# Patient Record
Sex: Female | Born: 1960 | Race: White | Hispanic: No | Smoking: Former smoker
Health system: Southern US, Community
[De-identification: ages and names within clinical notes are randomized; demographics above are authoritative.]

## PROBLEM LIST (undated history)

## (undated) DIAGNOSIS — B029 Zoster without complications: Secondary | ICD-10-CM

## (undated) DIAGNOSIS — M810 Age-related osteoporosis without current pathological fracture: Secondary | ICD-10-CM

## (undated) DIAGNOSIS — G4730 Sleep apnea, unspecified: Secondary | ICD-10-CM

## (undated) DIAGNOSIS — A389 Scarlet fever, uncomplicated: Secondary | ICD-10-CM

## (undated) DIAGNOSIS — F32A Depression, unspecified: Secondary | ICD-10-CM

## (undated) DIAGNOSIS — S82899A Other fracture of unspecified lower leg, initial encounter for closed fracture: Secondary | ICD-10-CM

## (undated) DIAGNOSIS — E119 Type 2 diabetes mellitus without complications: Secondary | ICD-10-CM

## (undated) DIAGNOSIS — R161 Splenomegaly, not elsewhere classified: Secondary | ICD-10-CM

## (undated) DIAGNOSIS — K760 Fatty (change of) liver, not elsewhere classified: Secondary | ICD-10-CM

## (undated) DIAGNOSIS — M8580 Other specified disorders of bone density and structure, unspecified site: Secondary | ICD-10-CM

## (undated) DIAGNOSIS — D696 Thrombocytopenia, unspecified: Secondary | ICD-10-CM

## (undated) HISTORY — DX: Thrombocytopenia, unspecified: D69.6

## (undated) HISTORY — DX: Age-related osteoporosis without current pathological fracture: M81.0

## (undated) HISTORY — DX: Type 2 diabetes mellitus without complications: E11.9

## (undated) HISTORY — DX: Depression, unspecified: F32.A

## (undated) HISTORY — PX: ABDOMINAL HYSTERECTOMY: SHX81

## (undated) HISTORY — DX: Fatty (change of) liver, not elsewhere classified: K76.0

## (undated) HISTORY — PX: BREAST BIOPSY: SHX20

## (undated) HISTORY — PX: CERVICAL FUSION: SHX112

## (undated) HISTORY — DX: Splenomegaly, not elsewhere classified: R16.1

---

## 2011-01-13 DIAGNOSIS — G4733 Obstructive sleep apnea (adult) (pediatric): Secondary | ICD-10-CM | POA: Insufficient documentation

## 2011-01-13 DIAGNOSIS — M5030 Other cervical disc degeneration, unspecified cervical region: Secondary | ICD-10-CM | POA: Insufficient documentation

## 2011-01-13 DIAGNOSIS — F341 Dysthymic disorder: Secondary | ICD-10-CM | POA: Insufficient documentation

## 2011-01-13 DIAGNOSIS — A6000 Herpesviral infection of urogenital system, unspecified: Secondary | ICD-10-CM | POA: Insufficient documentation

## 2011-01-13 DIAGNOSIS — M899 Disorder of bone, unspecified: Secondary | ICD-10-CM | POA: Insufficient documentation

## 2011-01-13 DIAGNOSIS — E785 Hyperlipidemia, unspecified: Secondary | ICD-10-CM | POA: Insufficient documentation

## 2011-04-03 ENCOUNTER — Emergency Department (INDEPENDENT_AMBULATORY_CARE_PROVIDER_SITE_OTHER): Payer: BC Managed Care – PPO

## 2011-04-03 ENCOUNTER — Encounter: Payer: Self-pay | Admitting: *Deleted

## 2011-04-03 ENCOUNTER — Emergency Department (HOSPITAL_BASED_OUTPATIENT_CLINIC_OR_DEPARTMENT_OTHER)
Admission: EM | Admit: 2011-04-03 | Discharge: 2011-04-03 | Disposition: A | Discharge status: Home / Self Care | Payer: BC Managed Care – PPO | Attending: Emergency Medicine | Admitting: Emergency Medicine

## 2011-04-03 DIAGNOSIS — R0602 Shortness of breath: Secondary | ICD-10-CM

## 2011-04-03 DIAGNOSIS — R0789 Other chest pain: Secondary | ICD-10-CM

## 2011-04-03 DIAGNOSIS — Z21 Asymptomatic human immunodeficiency virus [HIV] infection status: Secondary | ICD-10-CM | POA: Insufficient documentation

## 2011-04-03 DIAGNOSIS — G4730 Sleep apnea, unspecified: Secondary | ICD-10-CM | POA: Insufficient documentation

## 2011-04-03 DIAGNOSIS — J9801 Acute bronchospasm: Secondary | ICD-10-CM | POA: Insufficient documentation

## 2011-04-03 DIAGNOSIS — Z79899 Other long term (current) drug therapy: Secondary | ICD-10-CM | POA: Insufficient documentation

## 2011-04-03 DIAGNOSIS — R05 Cough: Secondary | ICD-10-CM

## 2011-04-03 DIAGNOSIS — F17200 Nicotine dependence, unspecified, uncomplicated: Secondary | ICD-10-CM | POA: Insufficient documentation

## 2011-04-03 DIAGNOSIS — R071 Chest pain on breathing: Secondary | ICD-10-CM | POA: Insufficient documentation

## 2011-04-03 HISTORY — DX: Scarlet fever, uncomplicated: A38.9

## 2011-04-03 HISTORY — DX: Other specified disorders of bone density and structure, unspecified site: M85.80

## 2011-04-03 HISTORY — DX: Sleep apnea, unspecified: G47.30

## 2011-04-03 HISTORY — DX: Zoster without complications: B02.9

## 2011-04-03 HISTORY — DX: Other fracture of unspecified lower leg, initial encounter for closed fracture: S82.899A

## 2011-04-03 IMAGING — CR DG CHEST 2V
2 series · 2 of 2 positions shown · non-contrast
Comparison: None.

CLINICAL DATA: Right chest pain, cough, shortness of breath

CHEST - 2 VIEW

[w chest pa]
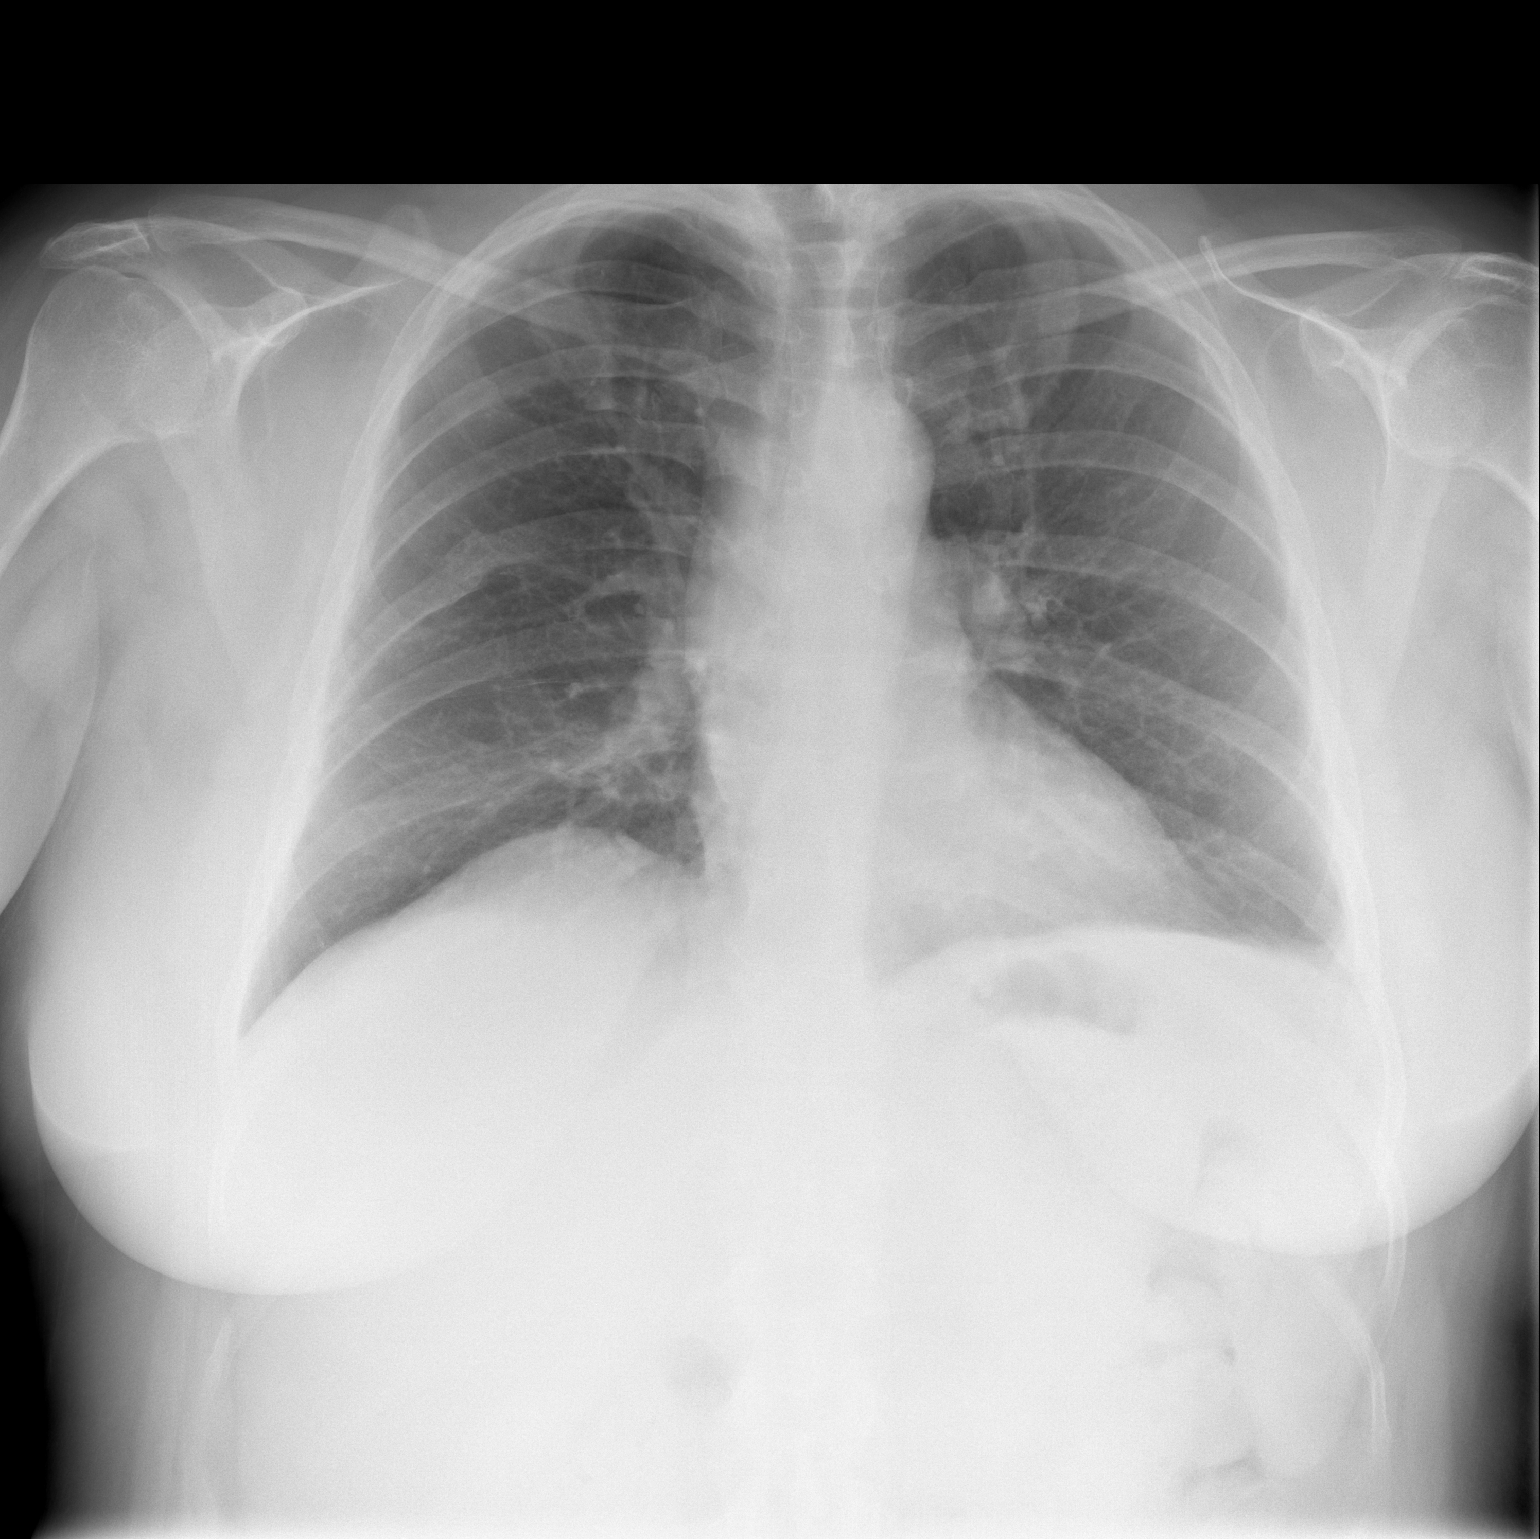

[w chest lat]
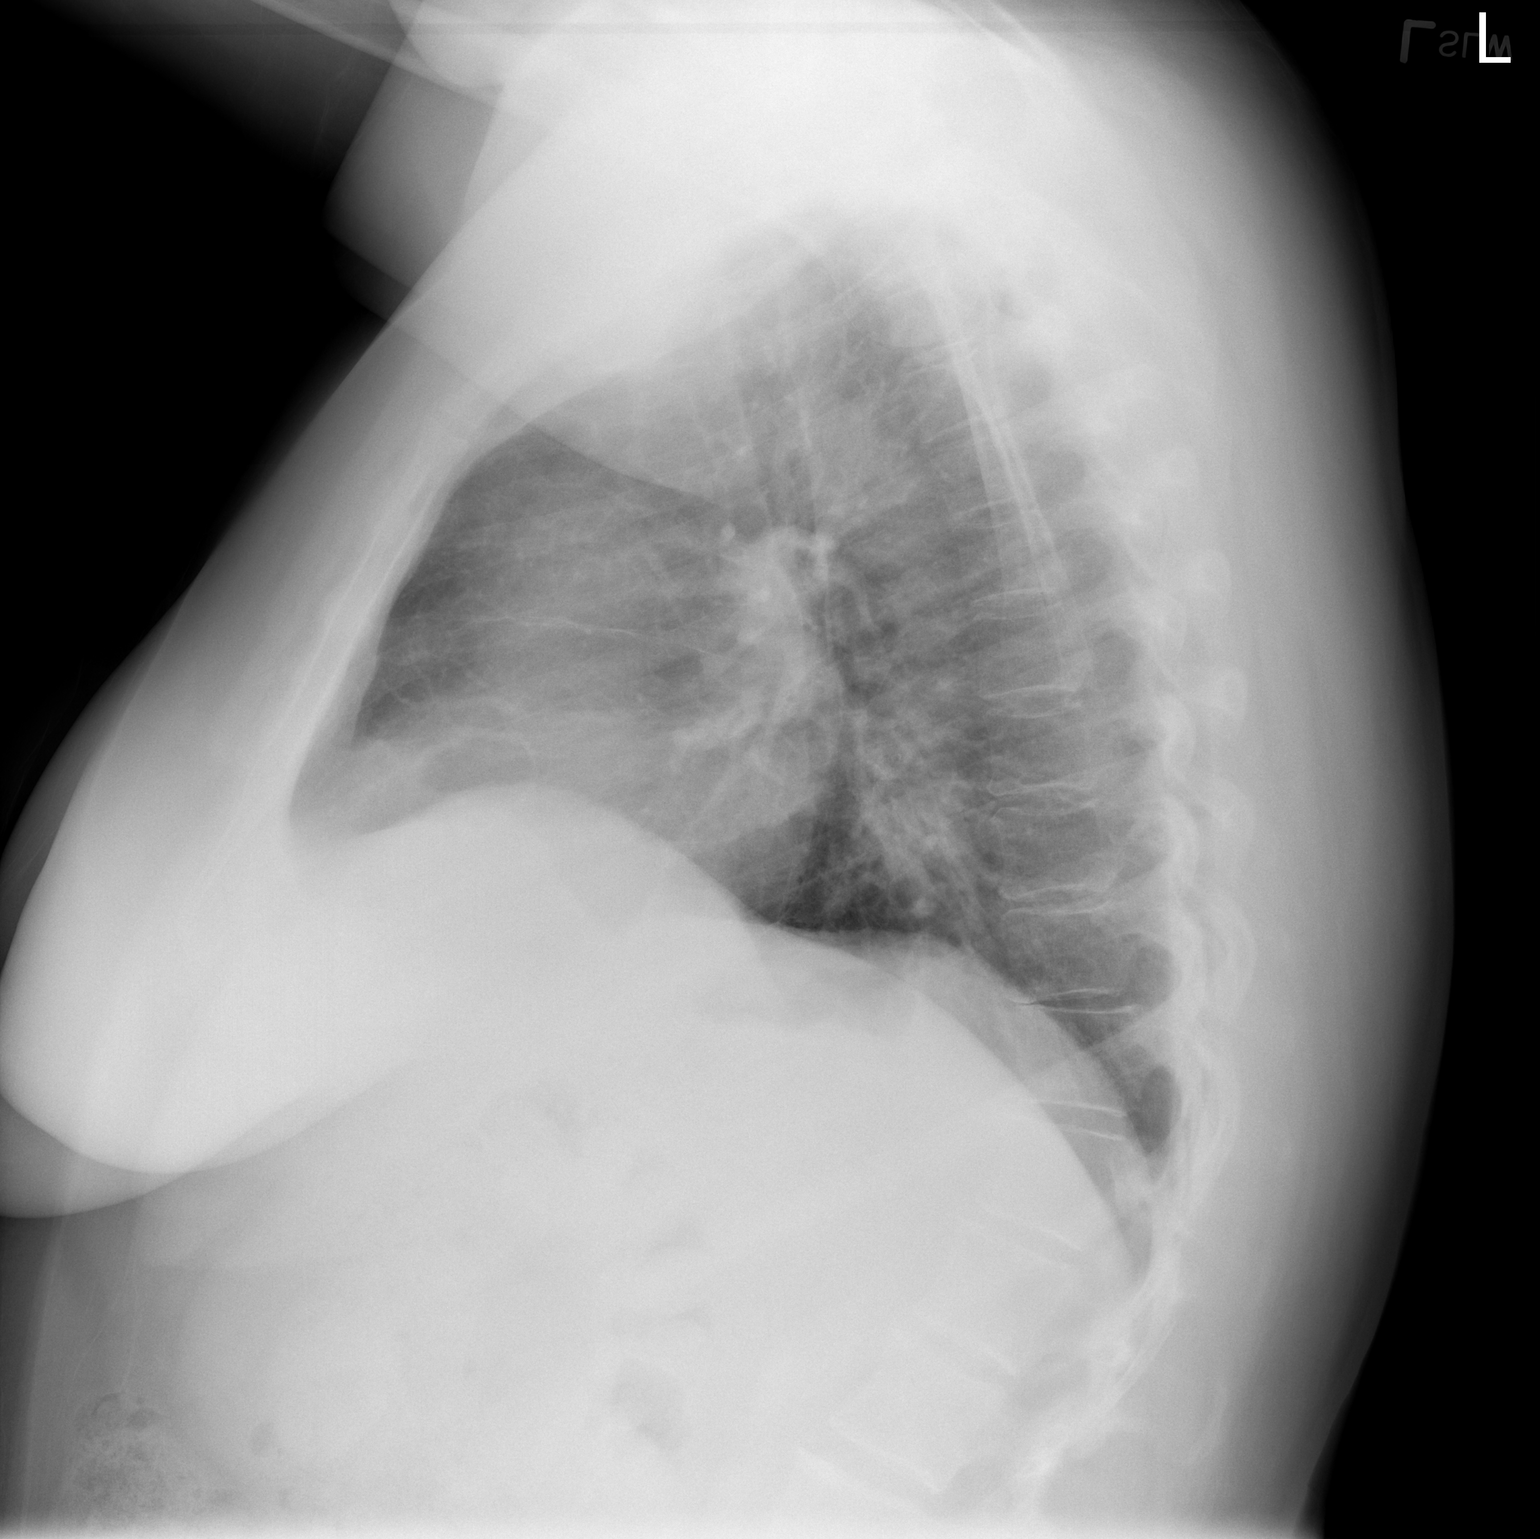

[2 of 2 positions shown; findings below may reference images not displayed]

FINDINGS: Lungs are clear. No pleural effusion or pneumothorax.

Cardiomediastinal silhouette is within normal limits.

Mild degenerative changes of the visualized thoracolumbar spine.
IMPRESSION: No evidence of acute cardiopulmonary disease.

## 2011-04-03 MED ORDER — LORAZEPAM 1 MG PO TABS: 1.0000 mg | ORAL_TABLET | Freq: Four times a day (QID) | ORAL | Status: AC | PRN

## 2011-04-03 MED ORDER — PREDNISONE 20 MG PO TABS: ORAL_TABLET | ORAL | Status: AC

## 2011-04-03 MED ORDER — HYDROMORPHONE HCL PF 2 MG/ML IJ SOLN
2.0000 mg | Freq: Once | INTRAMUSCULAR | Status: AC
  Administered 2011-04-03: 2 mg via INTRAMUSCULAR
  Filled 2011-04-03: qty 1

## 2011-04-03 MED ORDER — LORAZEPAM 2 MG/ML IJ SOLN
2.0000 mg | Freq: Once | INTRAMUSCULAR | Status: AC
  Administered 2011-04-03: 2 mg via INTRAMUSCULAR
  Filled 2011-04-03: qty 1

## 2011-04-03 MED ORDER — SERTRALINE HCL 50 MG PO TABS: 50.0000 mg | ORAL_TABLET | Freq: Every day | ORAL | Status: DC

## 2011-04-03 MED ORDER — OXYCODONE-ACETAMINOPHEN 5-325 MG PO TABS: 2.0000 | ORAL_TABLET | ORAL | Status: AC | PRN

## 2011-04-03 NOTE — ED Provider Notes (Addendum)
History  Scribed for Hurman Horn, MD, the patient was seen in room [facility]. This chart was scribed by Hillery Hunter.   CSN: [id number] Arrival date & time: 04/03/2011  3:43 PM   First MD Initiated Contact with Patient 04/03/11 1747      Chief Complaint  Patient presents with  . Rib Injury    The history is provided by the patient.    Helen Sandoval is a 50 y.o. female who presents to the Emergency Department complaining of coughing and wheezing for three weeks and now with right lateral rib pain for one week. She denies associated shortness of breath but rib pain is exacerbated with coughing, breathing and twisting movements. She followed-up with her PCP last week and was given Flexeril and Celebrex and splinting without pain relief. She has started taking an Albuterol inhaler (denies hx asthma) which has improved her associated wheezing. She denies history of DM, CA, asthma, COPD, and does not smoke, drink, or use any drugs.    Past Medical History  Diagnosis Date  . Sleep apnea   . Vitamin D deficiency   . Spina bifida   . Scarlet fever   . Shingles   . Hepatitis B infection   . Hepatitis C   . HIV (human immunodeficiency virus infection)   . Ankle fracture   . Osteopenia     Past Surgical History  Procedure Date  . Abdominal hysterectomy     History reviewed. No pertinent family history.  History  Substance Use Topics  . Smoking status: Current Some Day Smoker  . Smokeless tobacco: Not on file  . Alcohol Use: No     Review of Systems  Constitutional: Negative for fever.  Respiratory: Positive for wheezing. Negative for cough and shortness of breath.   Cardiovascular: Positive for chest pain (right lateral lower rib pain).  Gastrointestinal: Negative for abdominal pain.  Skin: Negative for rash.  Psychiatric/Behavioral: Negative for confusion.    Allergies  Penicillins cross reactors; Levaquin; and Zocor  Home Medications   Current  Outpatient Rx  Name Route Sig Dispense Refill  . ASPIRIN EC 81 MG PO TBEC Oral Take 81 mg by mouth every other day.      . B COMPLEX PO TABS Oral Take 1 tablet by mouth daily.      . BUPROPION HCL ER (XL) 150 MG PO TB24 Oral Take 150 mg by mouth daily.      Marland Kitchen CALCIUM 1200 PO Oral Take 1 tablet by mouth daily.      . CELECOXIB 200 MG PO CAPS Oral Take 200 mg by mouth once.      Marland Kitchen VITAMIN D 1000 UNITS PO TABS Oral Take 5,000 Units by mouth daily.      . CHROMIUM 500 MCG PO TABS Oral Take 1 tablet by mouth daily.      . CYCLOBENZAPRINE HCL 10 MG PO TABS Oral Take 10 mg by mouth 3 (three) times daily as needed. For muscle spasms     . ESTRADIOL 25 MCG VA TABS Vaginal Place 25 mcg vaginally 2 (two) times a week. Take on Tuesday and Friday     . ESTRADIOL 0.0375 MG/24HR TD PTTW Transdermal Place 1 patch onto the skin 2 (two) times a week. Replace on Monday and Thursday     . ONE-DAILY MULTI VITAMINS PO TABS Oral Take 1 tablet by mouth daily.      Marland Kitchen FISH OIL 500 MG PO CAPS Oral Take 1 capsule by  mouth daily.      Marland Kitchen OMEPRAZOLE 40 MG PO CPDR Oral Take 40 mg by mouth daily.      . SERTRALINE HCL 50 MG PO TABS Oral Take 50 mg by mouth daily.      Marland Kitchen VALACYCLOVIR HCL 500 MG PO TABS Oral Take 500 mg by mouth daily.      Marland Kitchen VITAMIN C 500 MG PO TABS Oral Take 500 mg by mouth daily.        Triage Vitals: BP 114/76  Pulse 80  Temp(Src) 98 F (36.7 C) (Oral)  Resp 20  Ht 5\' 4"  (1.626 m)  Wt 190 lb (86.183 kg)  BMI 32.61 kg/m2  SpO2 94%  Physical Exam  Nursing note and vitals reviewed. Constitutional:       Awake, alert, nontoxic appearance.  HENT:  Head: Atraumatic.  Eyes: Right eye exhibits no discharge. Left eye exhibits no discharge.  Neck: Neck supple.  Cardiovascular: Normal rate, regular rhythm and normal heart sounds.   No murmur heard. Pulmonary/Chest: Effort normal. She has wheezes (mild expiratory). She has no rhonchi. She has no rales. She exhibits no tenderness.  Abdominal: Soft.  Bowel sounds are normal. There is no tenderness. There is no rebound.  Musculoskeletal: She exhibits no tenderness.       Baseline ROM, no obvious new focal weakness. No midline tenderness entire spine Right lateral lower rib cage exquisitely tender to palpation  Neurological:       Mental status and motor strength appears baseline for patient and situation.  Skin: Skin is dry. No rash noted.  Psychiatric: She has a normal mood and affect.    ED Course  Procedures   Dg Chest 2 View  04/03/2011  *RADIOLOGY REPORT*  Clinical Data: Right chest pain, cough, shortness of breath  CHEST - 2 VIEW  Comparison: None.  Findings: Lungs are clear. No pleural effusion or pneumothorax.  Cardiomediastinal silhouette is within normal limits.  Mild degenerative changes of the visualized thoracolumbar spine.  IMPRESSION: No evidence of acute cardiopulmonary disease.  Original Report Authenticated By: Charline Bills, M.D.     OTHER DATA REVIEWED: Nursing notes, vital signs, and past medical records reviewed.   DIAGNOSTIC STUDIES: Oxygen Saturation is 94% on room air, adequate by my interpretation.     ED COURSE / COORDINATION OF CARE: 17:56. Ordered: New - HYDROmorphone (DILAUDID) injection 2 mg ; LORazepam (ATIVAN) injection 2 mg.  17:59:08 Discharge Orders Placed: JB oxyCODONE-acetaminophen (PERCOCET) 5-325 MG per tablet ; LORazepam (ATIVAN) 1 MG tablet, predniSONE (DELTASONE) 20 MG tablet     MDM  Patient informed of clinical course, understand medical decision-making process, and agree with plan.  I doubt any other EMC precluding discharge at this time including, but not necessarily limited to the following:sepsis, ACS.   1. Chest wall pain   2. Bronchospasm       I personally performed the services described in this documentation, which was scribed in my presence. The recorded information has been reviewed and considered.    Hurman Horn, MD 04/04/11 0004  Addendum  [id number]: HIV, Hep B, and Hep C should have been stricken from the PMH (denied by the Pt) and were inadvertently included in the PMH recorded prior to the MD exam.   Hurman Horn, MD 04/11/11 1434

## 2011-04-03 NOTE — ED Notes (Signed)
Pt states she has had right rib pain X 1 week. Increased with movement and deep inspiration. Also C/O cough, SHOB. Saw Dr last week. 3 weeks ago had CXR and Incentive Spirometry. Dx'd with "a little obstruction" Sleep study test last Sat. Has tried Flexeril, Celebrex and a brace without relief. Stress test done in August "normal"

## 2011-04-06 ENCOUNTER — Telehealth (HOSPITAL_BASED_OUTPATIENT_CLINIC_OR_DEPARTMENT_OTHER): Payer: Self-pay | Admitting: Emergency Medicine

## 2011-04-06 ENCOUNTER — Encounter (HOSPITAL_BASED_OUTPATIENT_CLINIC_OR_DEPARTMENT_OTHER): Payer: Self-pay

## 2011-04-06 NOTE — ED Notes (Signed)
Pt called and stated that Medical History was reported incorrectly.  Stated that she was negative for Hepatitis B, Hepatitis C, and HIV.  History updated to reflect correct information

## 2013-03-25 DIAGNOSIS — G0491 Myelitis, unspecified: Secondary | ICD-10-CM | POA: Insufficient documentation

## 2013-08-26 DIAGNOSIS — G35 Multiple sclerosis: Secondary | ICD-10-CM | POA: Insufficient documentation

## 2013-10-24 DIAGNOSIS — K5732 Diverticulitis of large intestine without perforation or abscess without bleeding: Secondary | ICD-10-CM | POA: Insufficient documentation

## 2014-01-13 DIAGNOSIS — F332 Major depressive disorder, recurrent severe without psychotic features: Secondary | ICD-10-CM | POA: Insufficient documentation

## 2014-01-13 DIAGNOSIS — F419 Anxiety disorder, unspecified: Secondary | ICD-10-CM

## 2014-01-13 DIAGNOSIS — F329 Major depressive disorder, single episode, unspecified: Secondary | ICD-10-CM | POA: Insufficient documentation

## 2014-03-28 DIAGNOSIS — N3941 Urge incontinence: Secondary | ICD-10-CM | POA: Insufficient documentation

## 2014-05-08 DIAGNOSIS — N3946 Mixed incontinence: Secondary | ICD-10-CM | POA: Insufficient documentation

## 2015-01-06 ENCOUNTER — Encounter: Payer: Self-pay | Admitting: Neurology

## 2015-01-06 ENCOUNTER — Ambulatory Visit (INDEPENDENT_AMBULATORY_CARE_PROVIDER_SITE_OTHER): Payer: No Typology Code available for payment source | Admitting: Neurology

## 2015-01-06 VITALS — BP 130/90 | HR 86 | Resp 16 | Ht 64.0 in | Wt 178.2 lb

## 2015-01-06 DIAGNOSIS — R5383 Other fatigue: Secondary | ICD-10-CM | POA: Diagnosis not present

## 2015-01-06 DIAGNOSIS — R269 Unspecified abnormalities of gait and mobility: Secondary | ICD-10-CM | POA: Diagnosis not present

## 2015-01-06 DIAGNOSIS — F329 Major depressive disorder, single episode, unspecified: Secondary | ICD-10-CM

## 2015-01-06 DIAGNOSIS — G35 Multiple sclerosis: Secondary | ICD-10-CM

## 2015-01-06 DIAGNOSIS — N3946 Mixed incontinence: Secondary | ICD-10-CM | POA: Diagnosis not present

## 2015-01-06 DIAGNOSIS — F418 Other specified anxiety disorders: Secondary | ICD-10-CM

## 2015-01-06 DIAGNOSIS — F419 Anxiety disorder, unspecified: Secondary | ICD-10-CM

## 2015-01-06 MED ORDER — TRAZODONE HCL 100 MG PO TABS: 100.0000 mg | ORAL_TABLET | Freq: Every day | ORAL | Status: DC

## 2015-01-06 NOTE — Progress Notes (Addendum)
[facility]  PATIENT: Helen Sandoval DOB: 07-Nov-1960  REFERRING DOCTOR OR PCP:  Otto Herb. ([facility])   Sinclair Ship (new PCP at [facility] SOURCE: patient and records from [facility]  _________________________________   HISTORICAL  CHIEF COMPLAINT:  Chief Complaint  Patient presents with  . Multiple Sclerosis    Helen Sandoval is here with her sister Helen Sandoval for eval of MS.  Sts. she was dx. in 2015.  Presenting sx.'s were blurred and double vision bilat., cognitive difficulty, gait/balance disturbance,  incontinence (bladder, occasionally bowel at night), and fatigue. She was previously treated by Dr. Pamalee Leyden at [facility].  Sts. dx. was confirmed with mri brain, lumbar puncture, and evoked visual potentials.  She was initially rx'd Tecfidera, but stopped this after a yr. due to progression of sx.  She sts.  . Gait Disturbance    an mri brain was checked and she had no new lesions.  She was then switched to Tysabri, and has had 3 infusions.  Sts. her last infusion was 12-18-14.  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 Sts. she had some nausea after the 1st infusion, but none since.  Sts. she feels better after Tysabri.  Sts. she still has some trouble with memory/cognition, walking/balance, sometimes has slurred speech "like you have a thick tongue." /fim  . Headaches    Sts. she has a hx. of frequent h/a's that resolved after neck surgery in 2014.  Sts. h/a's began to worsen again about 6 mos. ago--she would like to discuss tx. options/fim    HISTORY OF PRESENT ILLNESS:  I had the pleasure seeing you patient, Helen Sandoval, at [facility] for a neurologic consultation regarding her multiple sclerosis.  She is a 54 yo woman who was diagnosed with MS in 2015.    In 2008, she had headaches and poor balance and was told the MRI had some white matter foci at that time.    She had spinal stenosis  and needed surgery in 2014.  The surgeon felt her gait and other issues were decreased out of proportion to the extent of spine disease and referred her to Neurology.   Besides gait issues, she also had double vision, blurry vision, cognitive difficulty and incontinence in early 2015. She saw Dr. Pamalee Leyden in [place]. An MRI of the brain, CSF and visual evoked potentials were consistent with multiple sclerosis.    Initially, she was placed on Tecfidera. Due to progression with more neurologic symptoms, she was switched to Tysabri and had her third infusion 12/18/2014. 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   Her last MRI was done 10/2014 but we do not have the images.   She has recently moved from [place] to [place].     Gait/strength/sensation:    She drags her left leg and her balance is off leading to gait issues.    She was falling a lot last year but has not had nay falls since starting Tysabri.  She feels her left side has some weakness.  Her fingertips are numb bilaterally  Bladder/bowel:   She sees urology.    She was diagnosed with non-sensory incontinence and is doing much better since starting Myrbetriq.    Although bladder is better, she has had bowel incontinence the past 6 months.  Colonoscopy just showed one polyp.     Vision:    She has blurry vision out of both eyes  that seems intermittent.    She has occasional diplopia.     Fatigue/sleep:   She feels fatigue is much better since starting Tysabri.   Fatigue is much worse with heat.   Fatigue was both physical and cognitive.   She has difficulty with both sleep onset and sleep maintenance.  Clonazepam did not help much.   Xanax helps her fall asleep better.      Mood/cognition:  She had bad depression last year and is doing better while on Effexor and Wellbutrin.    She also had anxiety.   She notes difficulty with focus and has had some difficulty with verbal fluency and short term memory.     Headaches/Joint pain:    She reports headache in both temples and the occiput bilaterally.    Most of her joints are painful.   She had an ESR, ANA and was told they were fine.     REVIEW OF SYSTEMS: Constitutional: No fevers, chills, sweats, or change in appetite.   She notes fatigue Eyes: Reports visual changes and double vision.  No eye pain Ear, nose and throat: No hearing loss, ear pain, nasal congestion, sore throat Cardiovascular: No chest pain, palpitations Respiratory: No shortness of breath at rest or with exertion.   She has snoring and coughing but no wheezes GastrointestinaI: No nausea, vomiting, abdominal pain.  She notes diarrhea and some fecal incontinence Genitourinary: No dysuria, urinary retention or frequency. She has had some incontinence Musculoskeletal: No neck pain, back pain Integumentary: No rash, pruritus, skin lesions Neurological: as above Psychiatric: Some depression at this time.  Some anxiety Endocrine: No palpitations, diaphoresis, change in appetite, change in weigh or increased thirst Hematologic/Lymphatic: No anemia, purpura, petechiae. Allergic/Immunologic: No itchy/runny eyes, nasal congestion, recent allergic reactions, rashes  ALLERGIES: Allergies  Allergen Reactions  . Clindamycin Anaphylaxis  . Indomethacin Hives  . Levofloxacin Hives  . Penicillins Anaphylaxis  . Penicillins Cross Reactors Anaphylaxis  . Sulfa Antibiotics Swelling    Blister (steven john syndrome)  . Levaquin [Levofloxacin Hemihydrate] Hives  . Sulfamethoxazole-Trimethoprim   . Zocor [Simvastatin] Other (See Comments)    Other reaction(s): MUSCLE PAIN Muscle aches    HOME MEDICATIONS:  Current outpatient prescriptions:  .  albuterol (VENTOLIN HFA) 108 (90 BASE) MCG/ACT inhaler, Inhale into the lungs., Disp: , Rfl:  .  ALPRAZolam (XANAX) 0.5 MG tablet, Take 0.25 mg by mouth., Disp: , Rfl:  .  amphetamine-dextroamphetamine (ADDERALL) 10 MG tablet, Take 10 mg by mouth 2 (two) times daily.,  Disp: , Rfl:  .  aspirin EC 81 MG tablet, Take 81 mg by mouth every other day.  , Disp: , Rfl:  .  b complex vitamins tablet, Take 1 tablet by mouth daily.  , Disp: , Rfl:  .  buPROPion (WELLBUTRIN XL) 150 MG 24 hr tablet, Take 150 mg by mouth daily.  , Disp: , Rfl:  .  Calcium Carbonate-Vit D-Min (CALCIUM 1200 PO), Take 1 tablet by mouth daily.  , Disp: , Rfl:  .  cholecalciferol (VITAMIN D) 1000 UNITS tablet, Take 5,000 Units by mouth daily.  , Disp: , Rfl:  .  Coenzyme Q10 (COQ10) 400 MG CAPS, Take by mouth., Disp: , Rfl:  .  estradiol (VAGIFEM) 25 MCG vaginal tablet, Place 25 mcg vaginally 2 (two) times a week. Take on Tuesday and Friday , Disp: , Rfl:  .  estradiol (VIVELLE-DOT) 0.0375 MG/24HR, Place 1 patch onto the skin 2 (two) times a week. Replace on Monday and  Thursday , Disp: , Rfl:  .  ezetimibe (ZETIA) 10 MG tablet, Take 10 mg by mouth., Disp: , Rfl:  .  fluticasone (FLONASE) 50 MCG/ACT nasal spray, 1 spray by Each Nare route daily., Disp: , Rfl:  .  Green Tea, Camillia sinensis, ([employer] SUPER GREEN TEA EXTRACT) 250 MG CAPS, Take by mouth., Disp: , Rfl:  .  Krill Oil 1000 MG CAPS, Take 1,000 mg by mouth., Disp: , Rfl:  .  mirabegron ER (MYRBETRIQ) 25 MG TB24 tablet, Take 25 mg by mouth., Disp: , Rfl:  .  natalizumab (TYSABRI) 300 MG/15ML injection, Inject 300 mg into the vein., Disp: , Rfl:  .  Omega-3 Fatty Acids (FISH OIL) 500 MG CAPS, Take 1 capsule by mouth daily.  , Disp: , Rfl:  .  omeprazole (PRILOSEC) 20 MG capsule, Take 20 mg by mouth., Disp: , Rfl:  .  venlafaxine XR (EFFEXOR XR) 150 MG 24 hr capsule, Take 150 mg by mouth., Disp: , Rfl:  .  venlafaxine XR (EFFEXOR-XR) 75 MG 24 hr capsule, TAKE 1 CAPSULE BY MOUTH ONCE DAILY (TAKE IN ADDITION TO 127m CAPSULE FOR TOTAL DOSE = 2247m, Disp: , Rfl:  .  Chromium 500 MCG TABS, Take 1 tablet by mouth daily.  , Disp: , Rfl:  .  cyclobenzaprine (FLEXERIL) 10 MG tablet, Take 10 mg by mouth 3 (three) times daily as needed. For muscle  spasms , Disp: , Rfl:  .  Multiple Vitamin (MULTIVITAMIN) tablet, Take 1 tablet by mouth daily.  , Disp: , Rfl:  .  sertraline (ZOLOFT) 50 MG tablet, Take 50 mg by mouth daily.  , Disp: , Rfl:  .  sertraline (ZOLOFT) 50 MG tablet, Take 1 tablet (50 mg total) by mouth daily., Disp: 15 tablet, Rfl: 0 .  valACYclovir (VALTREX) 500 MG tablet, Take 500 mg by mouth daily.  , Disp: , Rfl:  .  vitamin C (ASCORBIC ACID) 500 MG tablet, Take 500 mg by mouth daily.  , Disp: , Rfl:   PAST MEDICAL HISTORY: Past Medical History  Diagnosis Date  . Sleep apnea   . Vitamin D deficiency   . Spina bifida   . Scarlet fever   . Shingles   . Ankle fracture   . Osteopenia     PAST SURGICAL HISTORY: Past Surgical History  Procedure Laterality Date  . Abdominal hysterectomy      FAMILY HISTORY: History reviewed. No pertinent family history.  SOCIAL HISTORY:  Social History   Social History  . Marital Status: Divorced    Spouse Name: N/A  . Number of Children: N/A  . Years of Education: N/A   Occupational History  . Not on file.   Social History Main Topics  . Smoking status: Current Some Day Smoker  . Smokeless tobacco: Not on file  . Alcohol Use: No  . Drug Use: No  . Sexual Activity: Not on file   Other Topics Concern  . Not on file   Social History Narrative     PHYSICAL EXAM  Filed Vitals:   01/06/15 1516  BP: 130/90  Pulse: 86  Resp: 16  Height: 5' 4"  (1.626 m)  Weight: 178 lb 3.2 oz (80.831 kg)    Body mass index is 30.57 kg/(m^2).   General: The patient is well-developed and well-nourished and in no acute distress  Eyes:  Funduscopic exam shows normal optic discs and retinal vessels.  Neck: The neck is supple, no carotid bruits are noted.  The neck  is nontender.  Cardiovascular: The heart has a regular rate and rhythm with a normal S1 and S2. There were no murmurs, gallops or rubs. Lungs are clear to auscultation.  Skin: Extremities are without significant  edema.  Musculoskeletal:  Back is nontender  Neurologic Exam  Mental status: The patient is alert and oriented x 3 at the time of the examination. The patient has apparent normal recent and remote memory, with an apparently normal attention span and concentration ability.   Speech is normal.  Cranial nerves: Extraocular movements are full. Pupils are equal, round, and reactive to light and accomodation.  Visual fields are full.  Facial symmetry is present. There is reduced right facial sensation to temperatur.Facial strength is normal.  Trapezius and sternocleidomastoid strength is normal. No dysarthria is noted.  The tongue is midline, and the patient has symmetric elevation of the soft palate. No obvious hearing deficits are noted.  Motor:  Muscle bulk is normal.   Tone is normal. Strength is  5 / 5 in all 4 extremities.   Sensory: Sensory testing is intact to temperature,  soft touch and vibration sensation in her legs but she felt these modalities better on the left than the right arm.  Coordination: Cerebellar testing reveals good finger-nose-finger and heel-to-shin bilaterally.  Gait and station: Station is normal.   Gait is mildly wide. Tandem gait is wide. Romberg is negative.   Reflexes: Deep tendon reflexes are symmetric and normal bilaterally.   Plantar responses are flexor.    DIAGNOSTIC DATA (LABS, IMAGING, TESTING) - I reviewed patient records, labs, notes, testing and imaging myself where available.       ASSESSMENT AND PLAN  DS (disseminated sclerosis)  Mixed incontinence  Anxiety and depression  Gait disturbance  Other fatigue   In summary, Helen Sandoval is a 54 year old woman with multiple sclerosis diagnosed in 2015. She is currently on Tysabri and feels that her MS is stable and her fatigue has improved. She is due for next infusion next week and we will try to get all the paperwork in that she is on schedule. She will continue the venlafaxine and  bupropion for her mood. We'll add trazodone for sleep. If it is not helpful, consider a benzodiazepine.  She will continue on Adderall for fatigue but does not need a refill at this time.  She will return for next infusion or call sooner if any new or worsening neurologic symptoms.   Sukhraj Esquivias A. Felecia Shelling, MD, PhD 4/85/9276, 3:94 PM Certified in Neurology, Clinical Neurophysiology, Sleep Medicine, Pain Medicine and Neuroimaging  [facility] [address] [place], [place] [address] [phone]

## 2015-01-08 ENCOUNTER — Encounter: Payer: Self-pay | Admitting: *Deleted

## 2015-01-14 ENCOUNTER — Telehealth: Payer: Self-pay | Admitting: Neurology

## 2015-01-14 NOTE — Telephone Encounter (Signed)
Encounter printed and given to [facility] in the infusion suite/fim

## 2015-01-14 NOTE — Telephone Encounter (Signed)
[place] with [employer] called stating pt has Sunol now and is needing prior [place] for tysabri. Shipment was to be tomorrow but has been delayed until prior Josem Kaufmann is approved. She can be reached at [phone]

## 2015-01-21 ENCOUNTER — Encounter: Payer: Self-pay | Admitting: *Deleted

## 2015-02-05 ENCOUNTER — Telehealth: Payer: Self-pay | Admitting: Neurology

## 2015-02-05 NOTE — Telephone Encounter (Signed)
I personally reviewed MRI's   MRI cervical spine 04/07/14:   Foci within spinal cord adjacent to C5, C6 +/- C7 appear chronic.  Of note, she has undergone C4-C6 ACDF  MRI Brain 11/12/14 and 04/07/14:  Scattered WM changes, many periventricular.  No change in interim

## 2015-02-17 ENCOUNTER — Other Ambulatory Visit: Payer: Self-pay | Admitting: *Deleted

## 2015-02-17 MED ORDER — AMPHETAMINE-DEXTROAMPHETAMINE 10 MG PO TABS: 10.0000 mg | ORAL_TABLET | Freq: Two times a day (BID) | ORAL | Status: DC

## 2015-02-17 NOTE — Telephone Encounter (Signed)
Rx. plus 2 postdated rx's provided at infusion appt/fim

## 2015-02-18 ENCOUNTER — Ambulatory Visit (INDEPENDENT_AMBULATORY_CARE_PROVIDER_SITE_OTHER): Payer: No Typology Code available for payment source | Admitting: Neurology

## 2015-02-18 ENCOUNTER — Encounter: Payer: Self-pay | Admitting: Neurology

## 2015-02-18 VITALS — BP 140/90 | HR 74 | Resp 16 | Ht 64.0 in | Wt 176.8 lb

## 2015-02-18 DIAGNOSIS — R5383 Other fatigue: Secondary | ICD-10-CM | POA: Diagnosis not present

## 2015-02-18 DIAGNOSIS — N3941 Urge incontinence: Secondary | ICD-10-CM

## 2015-02-18 DIAGNOSIS — F329 Major depressive disorder, single episode, unspecified: Secondary | ICD-10-CM

## 2015-02-18 DIAGNOSIS — G35 Multiple sclerosis: Secondary | ICD-10-CM | POA: Diagnosis not present

## 2015-02-18 DIAGNOSIS — R269 Unspecified abnormalities of gait and mobility: Secondary | ICD-10-CM

## 2015-02-18 DIAGNOSIS — N3946 Mixed incontinence: Secondary | ICD-10-CM | POA: Diagnosis not present

## 2015-02-18 DIAGNOSIS — F418 Other specified anxiety disorders: Secondary | ICD-10-CM | POA: Diagnosis not present

## 2015-02-18 DIAGNOSIS — F419 Anxiety disorder, unspecified: Secondary | ICD-10-CM

## 2015-02-18 MED ORDER — MIRABEGRON ER 50 MG PO TB24: 50.0000 mg | ORAL_TABLET | Freq: Every day | ORAL | Status: DC

## 2015-02-18 MED ORDER — METHYLPREDNISOLONE 4 MG PO TABS: 4.0000 mg | ORAL_TABLET | Freq: Every day | ORAL | Status: DC

## 2015-02-18 NOTE — Progress Notes (Signed)
[facility]  PATIENT: Helen Sandoval DOB: Apr 13, 1961  REFERRING DOCTOR OR PCP:  Otto Herb. ([facility])   Sinclair Ship (new PCP at [facility]) SOURCE: patient and records from [facility]  _________________________________   HISTORICAL  CHIEF COMPLAINT:  Chief Complaint  Patient presents with  . Multiple Sclerosis    Sts. she continues to tolerate Tysabri, but for about 24 hrs. post infusion feels like she  has a "bad hangover," more fatigued.  After that she feels fine.  She also has new c/o deep, "bone pain" bilat legs--from hips to legs, onset about 2  weeks ago.  No known injuries.  No relief with ibuprofen./fim    HISTORY OF PRESENT ILLNESS:  Helen Sandoval is a 54 yo woman who was diagnosed with MS in 2015.     She is on Tysabri therapy and doing well.  Her last infusion was yesterday.    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   She had a flu shot 3-4 weeks ago and has had bone pain right > left leg that started a few days later.    Pain is deep and pain is worse when she first stands up.   Ibuprofen has not helped.   .     Gait/strength/sensation:    She has mild left > right leg weakness and they sometiime buckle.   She drags her left leg and her balance is off leading to gait issues.    She was falling a lot last year but has not had nay falls since starting Tysabri.  She feels her left side has some weakness.  Her fingertips are numb bilaterally  Bladder/bowel:   She sees urology.    She was diagnosed with non-sensory incontinence and is doing much better since starting Myrbetriq.    Although bladder is better, she has had bowel incontinence the past 6 months.  Colonoscopy just showed one polyp.     Vision:    She has blurry vision out of both eyes that seems intermittent.    She has occasional diplopia.     Fatigue/sleep:   She feels fatigue is much better since starting Tysabri.   Fatigue is much worse with heat.    Fatigue is both physical and cognitive.   She has difficulty with both sleep onset and sleep maintenance and trazodone has helped.           Mood/cognition:  Mood is doing ok while on Effexor and Wellbutrin. She finds that she tears up if something sad on TV which had not happened in past.     She also had anxiety.   She notes difficulty with focus and has had some difficulty with verbal fluency and short term memory.     Headaches/Joint pain:   She had a headache x 2 weeks , helped by Toradol.  Pain is occipital , in the middle.    Most of her joints are painful.   She had an ESR, ANA and was told they were fine.     MS History:   In 2008, she had headaches and poor balance and was told the MRI had some white matter foci at that time.    She had spinal stenosis and needed surgery in 2014.  The surgeon felt her gait and other issues were decreased out of proportion to the extent of spine disease and referred her to Neurology.   Besides gait issues, she also had double vision, blurry  vision, cognitive difficulty and incontinence in early 2015. She saw Dr. Pamalee Leyden in [place]. An MRI of the brain, CSF and visual evoked potentials were consistent with multiple sclerosis.    Initially, she was placed on Tecfidera. Due to progression with more neurologic symptoms, she was switched to Tysabri.   Her last MRI was done 10/2014 but we do not have the images.     REVIEW OF SYSTEMS: Constitutional: No fevers, chills, sweats, or change in appetite.   She notes fatigue Eyes: Reports visual changes and double vision.  No eye pain Ear, nose and throat: No hearing loss, ear pain, nasal congestion, sore throat Cardiovascular: No chest pain, palpitations Respiratory: No shortness of breath at rest or with exertion.   She has snoring and coughing but no wheezes GastrointestinaI: No nausea, vomiting, abdominal pain.  She notes diarrhea and some fecal incontinence Genitourinary: No dysuria, urinary retention or  frequency. She has had some incontinence Musculoskeletal: No neck pain, back pain Integumentary: No rash, pruritus, skin lesions Neurological: as above Psychiatric: Some depression at this time.  Some anxiety Endocrine: No palpitations, diaphoresis, change in appetite, change in weigh or increased thirst Hematologic/Lymphatic: No anemia, purpura, petechiae. Allergic/Immunologic: No itchy/runny eyes, nasal congestion, recent allergic reactions, rashes  ALLERGIES: Allergies  Allergen Reactions  . Clindamycin Anaphylaxis  . Indomethacin Hives  . Levofloxacin Hives  . Penicillins Anaphylaxis  . Penicillins Cross Reactors Anaphylaxis  . Sulfa Antibiotics Swelling    Blister (steven john syndrome)  . Levaquin [Levofloxacin Hemihydrate] Hives  . Sulfamethoxazole-Trimethoprim   . Zocor [Simvastatin] Other (See Comments)    Other reaction(s): MUSCLE PAIN Muscle aches    HOME MEDICATIONS:  Current outpatient prescriptions:  .  albuterol (VENTOLIN HFA) 108 (90 BASE) MCG/ACT inhaler, Inhale into the lungs., Disp: , Rfl:  .  ALPRAZolam (XANAX) 0.5 MG tablet, Take 0.25 mg by mouth., Disp: , Rfl:  .  amphetamine-dextroamphetamine (ADDERALL) 10 MG tablet, Take 1 tablet (10 mg total) by mouth 2 (two) times daily., Disp: 60 tablet, Rfl: 0 .  aspirin EC 81 MG tablet, Take 81 mg by mouth every other day.  , Disp: , Rfl:  .  buPROPion (WELLBUTRIN XL) 150 MG 24 hr tablet, Take 150 mg by mouth daily.  , Disp: , Rfl:  .  Calcium Carbonate-Vit D-Min (CALCIUM 1200 PO), Take 1 tablet by mouth daily.  , Disp: , Rfl:  .  cholecalciferol (VITAMIN D) 1000 UNITS tablet, Take 5,000 Units by mouth daily.  , Disp: , Rfl:  .  estradiol (CLIMARA - DOSED IN MG/24 HR) 0.05 mg/24hr patch, , Disp: , Rfl:  .  ezetimibe (ZETIA) 10 MG tablet, Take 10 mg by mouth., Disp: , Rfl:  .  FLUARIX QUADRIVALENT 0.5 ML injection, , Disp: , Rfl:  .  Green Tea, Camillia sinensis, ([employer] SUPER GREEN TEA EXTRACT) 250 MG CAPS, Take by  mouth., Disp: , Rfl:  .  Krill Oil 1000 MG CAPS, Take 1,000 mg by mouth., Disp: , Rfl:  .  mirabegron ER (MYRBETRIQ) 25 MG TB24 tablet, Take 25 mg by mouth., Disp: , Rfl:  .  natalizumab (TYSABRI) 300 MG/15ML injection, Inject 300 mg into the vein., Disp: , Rfl:  .  omeprazole (PRILOSEC) 20 MG capsule, Take 20 mg by mouth., Disp: , Rfl:  .  traZODone (DESYREL) 100 MG tablet, Take 1 tablet (100 mg total) by mouth at bedtime., Disp: 30 tablet, Rfl: 11 .  venlafaxine XR (EFFEXOR XR) 150 MG 24 hr capsule, Take 150  mg by mouth., Disp: , Rfl:  .  venlafaxine XR (EFFEXOR-XR) 75 MG 24 hr capsule, TAKE 1 CAPSULE BY MOUTH ONCE DAILY (TAKE IN ADDITION TO $RemoveBef'150mg'wDXfdhTyiv$  CAPSULE FOR TOTAL DOSE = $Remo'225mg'aSluN$ ), Disp: , Rfl:  .  vitamin C (ASCORBIC ACID) 500 MG tablet, Take 500 mg by mouth daily.  , Disp: , Rfl:   PAST MEDICAL HISTORY: Past Medical History  Diagnosis Date  . Sleep apnea   . Vitamin D deficiency   . Spina bifida   . Scarlet fever   . Shingles   . Ankle fracture   . Osteopenia     PAST SURGICAL HISTORY: Past Surgical History  Procedure Laterality Date  . Abdominal hysterectomy      FAMILY HISTORY: History reviewed. No pertinent family history.  SOCIAL HISTORY:  Social History   Social History  . Marital Status: Divorced    Spouse Name: N/A  . Number of Children: N/A  . Years of Education: N/A   Occupational History  . Not on file.   Social History Main Topics  . Smoking status: Current Some Day Smoker  . Smokeless tobacco: Not on file  . Alcohol Use: No  . Drug Use: No  . Sexual Activity: Not on file   Other Topics Concern  . Not on file   Social History Narrative     PHYSICAL EXAM  Filed Vitals:   02/18/15 1311  BP: 140/90  Pulse: 74  Resp: 16  Height: $Remove'5\' 4"'uiwIOCx$  (1.626 m)  Weight: 176 lb 12.8 oz (80.196 kg)    Body mass index is 30.33 kg/(m^2).   General: The patient is well-developed and well-nourished and in no acute distress  Neck: The neck is supple, no  carotid bruits are noted.  The neck is  Mildly tender at the occiput  Skin: Extremities are without significant edema.  Musculoskeletal:  Back is nontender.   Legs are nontender  Neurologic Exam  Mental status: The patient is alert and oriented x 3 at the time of the examination. The patient has apparent normal recent and remote memory, with an apparently normal attention span and concentration ability.   Speech is normal.  Cranial nerves: Extraocular movements are full.   There is reduced right facial sensation to temperature.Facial strength is normal.  Trapezius and sternocleidomastoid strength is normal. No dysarthria is noted.  No obvious hearing deficits are noted.  Motor:  Muscle bulk is normal.   Tone is normal. Strength is  5 / 5 in all 4 extremities.   Sensory: Sensory testing is intact to temperature, soft touch and vibration sensation   Coordination: Cerebellar testing reveals good finger-nose-finger and heel-to-shin bilaterally.  Gait and station: Station is normal.   Gait is mildly wide. Tandem gait is wide. Romberg is negative.   Reflexes: Deep tendon reflexes are symmetric and 2+ arms and 3+ knees.   Plantar responses are flexor.    DIAGNOSTIC DATA (LABS, IMAGING, TESTING) - I reviewed patient records, labs, notes, testing and imaging myself where available.       ASSESSMENT AND PLAN  DS (disseminated sclerosis) - Plan: CBC with Differential/Platelet, Stratify JCV Antibody Test ([facility]), Hepatic Function Panel  Urge incontinence  Other fatigue - Plan: CBC with Differential/Platelet, Hepatic Function Panel  Mixed incontinence  Gait disturbance  Anxiety and depression  1.   Deep pain in legs may be related to her flu shot. Since NSAIDs had not helped, I will have her take a steroid pack. 2.  She will continue  Tysabri. I'm going to recheck her JCV antibody status and her CBC. We'll also check LFT and she has recently started. 3.  Myrbetriq has helped but she  continues to have bladder dysfunction so we will increase the dose to 50 mg. 4.   Continue other medications. 5.  Return in 4-6 months or sooner if there are new or worsening neurologic symptoms.   Richard A. Felecia Shelling, MD, PhD 6/80/8811, 0:31 PM Certified in Neurology, Clinical Neurophysiology, Sleep Medicine, Pain Medicine and Neuroimaging  [facility] [address] [place], [place] [address] [phone]

## 2015-02-19 LAB — CBC WITH DIFFERENTIAL/PLATELET
BASOS ABS: 0 10*3/uL (ref 0.0–0.2)
Basos: 0 %
EOS (ABSOLUTE): 0.2 10*3/uL (ref 0.0–0.4)
Eos: 3 %
HEMOGLOBIN: 13.4 g/dL (ref 11.1–15.9)
Hematocrit: 39.4 % (ref 34.0–46.6)
Immature Grans (Abs): 0 10*3/uL (ref 0.0–0.1)
Immature Granulocytes: 1 %
LYMPHS ABS: 1.5 10*3/uL (ref 0.7–3.1)
Lymphs: 23 %
MCH: 31.6 pg (ref 26.6–33.0)
MCHC: 34 g/dL (ref 31.5–35.7)
MCV: 93 fL (ref 79–97)
MONOCYTES: 8 %
MONOS ABS: 0.5 10*3/uL (ref 0.1–0.9)
NEUTROS ABS: 4.2 10*3/uL (ref 1.4–7.0)
Neutrophils: 65 %
Platelets: 158 10*3/uL (ref 150–379)
RBC: 4.24 x10E6/uL (ref 3.77–5.28)
RDW: 13.5 % (ref 12.3–15.4)
WBC: 6.4 10*3/uL (ref 3.4–10.8)

## 2015-02-19 LAB — HEPATIC FUNCTION PANEL
ALK PHOS: 64 IU/L (ref 39–117)
ALT: 34 IU/L — ABNORMAL HIGH (ref 0–32)
AST: 20 IU/L (ref 0–40)
Albumin: 4.5 g/dL (ref 3.5–5.5)
Bilirubin Total: 0.3 mg/dL (ref 0.0–1.2)
Bilirubin, Direct: 0.1 mg/dL (ref 0.00–0.40)
TOTAL PROTEIN: 6.2 g/dL (ref 6.0–8.5)

## 2015-02-23 ENCOUNTER — Telehealth: Payer: Self-pay | Admitting: *Deleted

## 2015-02-23 NOTE — Telephone Encounter (Signed)
Patient form on Faith desk. 

## 2015-02-23 NOTE — Telephone Encounter (Signed)
[place].  I received disability paperwork to complete and have questions about it/fim

## 2015-02-23 NOTE — Telephone Encounter (Signed)
Pt called requesting to speak with Faith regarding information for disability form

## 2015-02-24 NOTE — Telephone Encounter (Signed)
I have spoken with Helen Sandoval this morning.  She sts. her sister can attest to memory/cognition problems if needed.  I have verbalized understanding of same, advised that paperwork is on RAS desk for completion--I will call her once it is done/fim

## 2015-03-04 ENCOUNTER — Telehealth: Payer: Self-pay | Admitting: Neurology

## 2015-03-04 DIAGNOSIS — M79604 Pain in right leg: Secondary | ICD-10-CM

## 2015-03-04 DIAGNOSIS — M79605 Pain in left leg: Principal | ICD-10-CM

## 2015-03-04 MED ORDER — TRAMADOL HCL 50 MG PO TABS: 50.0000 mg | ORAL_TABLET | Freq: Two times a day (BID) | ORAL | Status: DC | PRN

## 2015-03-04 NOTE — Telephone Encounter (Signed)
I called the patient back.  Got no answer.  Left message relaying providers note.   

## 2015-03-04 NOTE — Telephone Encounter (Signed)
Patient is calling. She was given a Rx for Prednisone for leg pain but after taking all of the medication it did not help. Is there something else  the patient can take for the pain. Please call patient and discuss. Patient uses [facility] on [address]. Thank you.

## 2015-03-04 NOTE — Telephone Encounter (Signed)
She is on multiple medications. Drug interaction can be a concern. If tramadol is not contraindicated, we can try 50 mg up to twice daily as needed. Please have her check back in with [place] next week at the latest. I placed an order for tramadol 50 mg twice a day when necessary pain. I did not see any contraindication for it. Please advise her that it is a medication that can potentially be addicting and sedating.

## 2015-03-04 NOTE — Telephone Encounter (Signed)
Patient was given a Medrol Dose Pack at [facility] on 09/28 for pain in her legs.  She tried NSAIDS, but they were not effective, and feels steroid has not been beneficial either.  Would like a message sent to the provider asking for recommendation.  Dr Felecia Shelling is out of the office, forwarding request to [facility] for review.  Thank you.

## 2015-03-05 MED ORDER — GABAPENTIN 300 MG PO CAPS: 300.0000 mg | ORAL_CAPSULE | Freq: Three times a day (TID) | ORAL | Status: DC

## 2015-03-05 MED ORDER — HYDROCODONE-ACETAMINOPHEN 5-325MG PREPACK (~~LOC~~: ORAL_TABLET | ORAL | Status: DC

## 2015-03-05 NOTE — Telephone Encounter (Signed)
I called patient to let her know but she said she has tramadol and they do not help her pain. She reports that she had a few Hydrocodone 10-325 left and was wondering if she can be written for a prescription for a few of them to get through the weekend until Dr. Felecia Shelling gets back?

## 2015-03-05 NOTE — Telephone Encounter (Signed)
The prescription is ready for p/u at our front desk.

## 2015-03-05 NOTE — Telephone Encounter (Addendum)
Chart reviewed, patient has MS, receiving [place]ane Dee, also on polypharmacy treatment Wellbutrin, Effexor,  She was  treated with [facility] pack since February 18 2015, was also giving a prescription of tramadol, I will call in Neurontin 300 mg 3 times a day, also Percocet

## 2015-03-05 NOTE — Addendum Note (Signed)
Addended by: Marcial Pacas on: 03/05/2015 09:02 AM   Modules accepted: Orders

## 2015-03-05 NOTE — Telephone Encounter (Signed)
Spoke to patient - she will come by our office to pick up the hydrocodone rx (placed up front for her) and she is agreeable to try gabapentin (aware it is at the pharmacy and I went over the medication with her).  The Tramadol rx printed by Dr. Rexene Alberts on 03/04/15 has been destroyed.

## 2015-03-11 ENCOUNTER — Telehealth: Payer: Self-pay | Admitting: Neurology

## 2015-03-11 NOTE — Telephone Encounter (Signed)
noted.  Helen Sandoval will call [employer] back/fim

## 2015-03-11 NOTE — Telephone Encounter (Signed)
Helen Sandoval with [facility] calling in regards to shipping pt's Tysabri for upcoming appt on Tuesday. Please call and advise ASAP at  [phone]. Spoke to [employer], she will return phone call.

## 2015-03-17 ENCOUNTER — Telehealth: Payer: Self-pay | Admitting: *Deleted

## 2015-03-17 ENCOUNTER — Encounter (INDEPENDENT_AMBULATORY_CARE_PROVIDER_SITE_OTHER): Payer: Self-pay

## 2015-03-17 DIAGNOSIS — M5441 Lumbago with sciatica, right side: Secondary | ICD-10-CM

## 2015-03-17 DIAGNOSIS — R269 Unspecified abnormalities of gait and mobility: Secondary | ICD-10-CM

## 2015-03-17 DIAGNOSIS — G35 Multiple sclerosis: Secondary | ICD-10-CM

## 2015-03-17 MED ORDER — CYCLOBENZAPRINE HCL 5 MG PO TABS: 5.0000 mg | ORAL_TABLET | Freq: Three times a day (TID) | ORAL | Status: DC | PRN

## 2015-03-17 NOTE — Telephone Encounter (Signed)
[place]. At infusion appt. today, Helen Sandoval c/o continued lb, bilat leg pain, right worse than left, despite tx. with Medrol dose pk, Gabapentin, and Hydrocodone.  Per RAS, ok for Flexeril 5mg  po tid and MRI L-spine without contrast.  I will offer these tx's, make sure this plan is agreeable with her/fim

## 2015-03-17 NOTE — Telephone Encounter (Signed)
Pt returned call. Please call [phone]. Thank you

## 2015-03-17 NOTE — Telephone Encounter (Signed)
I have spoken with Helen Sandoval this afternoon and per RAS, offered Flexeril 5mg  po tid and mri L-spine.  She is agreeable.  Sts. ins. requires mri be done at [facility] in [facility], and I have noted this in the order.  Pt. to get a cd of mri and deliver it to our office or request cd be delivered to Dr. Arlean Hopping

## 2015-03-26 NOTE — Telephone Encounter (Signed)
I have spoken with Helen Sandoval this afternoon--her study was done at [facility].  She sts. [facility] was to mail a cd of mri l-spine to [facility].  I have not seen it--she will check with [facility] to make sure they mailed it; if not, she has a copy of the mri and she will drop it by our office/fim

## 2015-03-26 NOTE — Telephone Encounter (Signed)
Patient called to request MRI results. Please call [phone].

## 2015-03-27 NOTE — Telephone Encounter (Signed)
Patient called to advise she called [facility] and they advised that "MRI is available online and Dr. Felecia Shelling can pull up any time, he has access".

## 2015-03-27 NOTE — Telephone Encounter (Signed)
I have spoken with Helen Sandoval and advised that Dr. Felecia Shelling can see that she has had an mri at [facility], but he is not able to access the actual images.  She verbalized understanding of same--she has a CD of mri at home and will drop it by our office on Monday morning/fim

## 2015-03-30 ENCOUNTER — Other Ambulatory Visit: Payer: Self-pay | Admitting: Neurology

## 2015-03-30 ENCOUNTER — Telehealth: Payer: Self-pay

## 2015-03-30 NOTE — Telephone Encounter (Signed)
If it is helping her numbness/tingling, she should continue to take it.

## 2015-03-30 NOTE — Telephone Encounter (Signed)
I have spoken with Helen Sandoval this afternoon, and per RAS, advised if Gabapentin helps, she should continue it.  She verbalized understanding of same, sts. Gabapentin does help, so she will continue it/fim

## 2015-03-30 NOTE — Telephone Encounter (Signed)
WID prescribed Gabapentin 300mg , one three times daily on 10/13.  Patient would like to know if Dr Felecia Shelling would like to continue her in this medication.  Please advise.  Thank you.

## 2015-04-03 ENCOUNTER — Encounter: Payer: Self-pay | Admitting: Neurology

## 2015-04-10 ENCOUNTER — Ambulatory Visit (INDEPENDENT_AMBULATORY_CARE_PROVIDER_SITE_OTHER): Payer: No Typology Code available for payment source | Admitting: Neurology

## 2015-04-10 ENCOUNTER — Encounter: Payer: Self-pay | Admitting: Neurology

## 2015-04-10 VITALS — BP 126/70 | HR 74 | Resp 13 | Ht 64.0 in | Wt 190.0 lb

## 2015-04-10 DIAGNOSIS — M5137 Other intervertebral disc degeneration, lumbosacral region: Secondary | ICD-10-CM | POA: Diagnosis not present

## 2015-04-10 DIAGNOSIS — G35 Multiple sclerosis: Secondary | ICD-10-CM | POA: Diagnosis not present

## 2015-04-10 DIAGNOSIS — R269 Unspecified abnormalities of gait and mobility: Secondary | ICD-10-CM

## 2015-04-10 DIAGNOSIS — M25551 Pain in right hip: Secondary | ICD-10-CM | POA: Diagnosis not present

## 2015-04-10 DIAGNOSIS — N3946 Mixed incontinence: Secondary | ICD-10-CM

## 2015-04-10 DIAGNOSIS — R5383 Other fatigue: Secondary | ICD-10-CM | POA: Diagnosis not present

## 2015-04-10 MED ORDER — DULOXETINE HCL 60 MG PO CPEP: 60.0000 mg | ORAL_CAPSULE | Freq: Every day | ORAL | Status: DC

## 2015-04-10 MED ORDER — CYCLOBENZAPRINE HCL 5 MG PO TABS: 5.0000 mg | ORAL_TABLET | Freq: Three times a day (TID) | ORAL | Status: DC | PRN

## 2015-04-10 MED ORDER — HYDROCODONE-ACETAMINOPHEN 5-325MG PREPACK (~~LOC~~: ORAL_TABLET | ORAL | Status: DC

## 2015-04-10 NOTE — Progress Notes (Signed)
[facility]  PATIENT: Helen Sandoval DOB: Jun 21, 1960  REFERRING DOCTOR OR PCP:  Otto Herb. ([facility])   Sinclair Ship (new PCP at [facility]) SOURCE: patient and records from [facility]  _________________________________   HISTORICAL  CHIEF COMPLAINT:  Chief Complaint  Patient presents with  . Multiple Sclerosis    Sts. she continues to tolerate Tysabri well.  JCV ab last checked 02-19-15 and was 0.32.  She is here today with continues c/o bilat leg, right hip pain.  Sts. lower leg pain is some improved with Gabapentin, Flexeril. Sts. right now right hip pain is the worst/fim    HISTORY OF PRESENT ILLNESS:  Helen Sandoval is a 54 yo woman who was diagnosed with MS in 2015.     She is on Tysabri therapy and doing well.  Her last infusion was yesterday.    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   She has had 3 months of right leg pain.    Gabapentin slightly helped the right leg but did help the much milder  left leg pain.  Pain is mostly in the right thigh/hip and buttock and not in her feet.    Pain is deep and pain is worse when she first stands up. Climbing stairs or putting weight on her right leg increases the pain.    NSAID's have not helped.   The steroid pack did not help.        Gait/strength/sensation:    With increased pain, she feels gait is worse.  Her right side feels weaker     She drags her right leg and her balance is off leading to gait issues.   He fell when he rrihgt leg gave out a couplel weeks ago.    Her fingertips are numb bilaterally, worse on her right  Bladder/bowel:     She was diagnosed with non-sensory incontinence and is doing much better since we increased the Myrbetriq.    No recent bowel incontinence.  Vision:    She has blurry vision out of both eyes that seems intermittent.    She has occasional diplopia.     Fatigue/sleep:   She feels fatigue is much better since starting Tysabri.   Fatigue is  much worse with heat.   Fatigue is both physical and cognitive.   She has difficulty with both sleep onset and sleep maintenance and trazodone has helped.           Mood/cognition:  Mood is doing ok while on Effexor and Wellbutrin.      She also had anxiety.  With increased pain, she also feels she has been more forgetful.     She notes difficulty with focus and has had some difficulty with verbal fluency and short term memory.     MS History:   In 2008, she had headaches and poor balance and was told the MRI had some white matter foci at that time.    She had spinal stenosis and needed surgery in 2014.  The surgeon felt her gait and other issues were decreased out of proportion to the extent of spine disease and referred her to Neurology.   Besides gait issues, she also had double vision, blurry vision, cognitive difficulty and incontinence in early 2015. She saw Dr. Pamalee Leyden in [place]. An MRI of the brain, CSF and visual evoked potentials were consistent with multiple sclerosis.    Initially, she was placed on Tecfidera. Due to progression with more  neurologic symptoms, she was switched to Tysabri.   Her last MRI was done 10/2014 but we do not have the images.     REVIEW OF SYSTEMS: Constitutional: No fevers, chills, sweats, or change in appetite.   She notes fatigue Eyes: Reports visual changes and double vision.  No eye pain Ear, nose and throat: No hearing loss, ear pain, nasal congestion, sore throat Cardiovascular: No chest pain, palpitations Respiratory: No shortness of breath at rest or with exertion.   She has snoring and coughing but no wheezes GastrointestinaI: No nausea, vomiting, abdominal pain.  She notes diarrhea and some fecal incontinence Genitourinary: No dysuria, urinary retention or frequency. She has had some incontinence Musculoskeletal: No neck pain, back pain Integumentary: No rash, pruritus, skin lesions Neurological: as above Psychiatric: Some depression at this time.   Some anxiety Endocrine: No palpitations, diaphoresis, change in appetite, change in weigh or increased thirst Hematologic/Lymphatic: No anemia, purpura, petechiae. Allergic/Immunologic: No itchy/runny eyes, nasal congestion, recent allergic reactions, rashes  ALLERGIES: Allergies  Allergen Reactions  . Clindamycin Anaphylaxis  . Indomethacin Hives  . Levofloxacin Hives  . Penicillins Anaphylaxis  . Penicillins Cross Reactors Anaphylaxis  . Sulfa Antibiotics Swelling    Blister (steven john syndrome)  . Levaquin [Levofloxacin Hemihydrate] Hives  . Sulfamethoxazole-Trimethoprim   . Zocor [Simvastatin] Other (See Comments)    Other reaction(s): MUSCLE PAIN Muscle aches    HOME MEDICATIONS:  Current outpatient prescriptions:  .  albuterol (VENTOLIN HFA) 108 (90 BASE) MCG/ACT inhaler, Inhale into the lungs., Disp: , Rfl:  .  ALPRAZolam (XANAX) 0.5 MG tablet, Take 0.25 mg by mouth., Disp: , Rfl:  .  amphetamine-dextroamphetamine (ADDERALL) 10 MG tablet, Take 1 tablet (10 mg total) by mouth 2 (two) times daily., Disp: 60 tablet, Rfl: 0 .  aspirin EC 81 MG tablet, Take 81 mg by mouth every other day.  , Disp: , Rfl:  .  buPROPion (WELLBUTRIN XL) 150 MG 24 hr tablet, Take 150 mg by mouth daily.  , Disp: , Rfl:  .  cholecalciferol (VITAMIN D) 1000 UNITS tablet, Take 5,000 Units by mouth daily.  , Disp: , Rfl:  .  cyclobenzaprine (FLEXERIL) 5 MG tablet, Take 1 tablet (5 mg total) by mouth 3 (three) times daily as needed for muscle spasms., Disp: 90 tablet, Rfl: 0 .  DULERA 200-5 MCG/ACT AERO, , Disp: , Rfl:  .  estradiol (CLIMARA - DOSED IN MG/24 HR) 0.05 mg/24hr patch, , Disp: , Rfl:  .  ezetimibe (ZETIA) 10 MG tablet, Take 10 mg by mouth., Disp: , Rfl:  .  FLUARIX QUADRIVALENT 0.5 ML injection, , Disp: , Rfl:  .  gabapentin (NEURONTIN) 300 MG capsule, TAKE ONE CAPSULE BY MOUTH THREE TIMES DAILY, Disp: 90 capsule, Rfl: 3 .  Green Tea, Camillia sinensis, ([employer] SUPER GREEN TEA EXTRACT) 250  MG CAPS, Take by mouth., Disp: , Rfl:  .  HYDROcodone-acetaminophen (VICODIN) 5-325 mg TABS tablet, 1 tablet as needed every 8 hours, Disp: 20 tablet, Rfl: 0 .  Krill Oil 1000 MG CAPS, Take 1,000 mg by mouth., Disp: , Rfl:  .  mirabegron ER (MYRBETRIQ) 50 MG TB24 tablet, Take 1 tablet (50 mg total) by mouth daily., Disp: 30 tablet, Rfl: 11 .  natalizumab (TYSABRI) 300 MG/15ML injection, Inject 300 mg into the vein., Disp: , Rfl:  .  omeprazole (PRILOSEC) 20 MG capsule, Take 20 mg by mouth., Disp: , Rfl:  .  traMADol (ULTRAM) 50 MG tablet, Take 1 tablet (50 mg total)  by mouth every 12 (twelve) hours as needed., Disp: 30 tablet, Rfl: 0 .  traZODone (DESYREL) 100 MG tablet, Take 1 tablet (100 mg total) by mouth at bedtime., Disp: 30 tablet, Rfl: 11 .  venlafaxine XR (EFFEXOR XR) 150 MG 24 hr capsule, Take 150 mg by mouth., Disp: , Rfl:  .  venlafaxine XR (EFFEXOR-XR) 75 MG 24 hr capsule, TAKE 1 CAPSULE BY MOUTH ONCE DAILY (TAKE IN ADDITION TO 150mg  CAPSULE FOR TOTAL DOSE = 225mg ), Disp: , Rfl:  .  vitamin C (ASCORBIC ACID) 500 MG tablet, Take 500 mg by mouth daily.  , Disp: , Rfl:  .  Calcium Carbonate-Vit D-Min (CALCIUM 1200 PO), Take 1 tablet by mouth daily.  , Disp: , Rfl:  .  methylPREDNISolone (MEDROL) 4 MG tablet, Take 1 tablet (4 mg total) by mouth daily. (Patient not taking: Reported on 04/10/2015), Disp: 21 tablet, Rfl: 0  PAST MEDICAL HISTORY: Past Medical History  Diagnosis Date  . Sleep apnea   . Vitamin D deficiency   . Spina bifida   . Scarlet fever   . Shingles   . Ankle fracture   . Osteopenia     PAST SURGICAL HISTORY: Past Surgical History  Procedure Laterality Date  . Abdominal hysterectomy      FAMILY HISTORY: History reviewed. No pertinent family history.  SOCIAL HISTORY:  Social History   Social History  . Marital Status: Divorced    Spouse Name: N/A  . Number of Children: N/A  . Years of Education: N/A   Occupational History  . Not on file.   Social  History Main Topics  . Smoking status: Current Some Day Smoker  . Smokeless tobacco: Not on file  . Alcohol Use: No  . Drug Use: No  . Sexual Activity: Not on file   Other Topics Concern  . Not on file   Social History Narrative     PHYSICAL EXAM  There were no vitals filed for this visit.  There is no weight on file to calculate BMI.   General: The patient is well-developed and well-nourished and in no acute distress  Neck: The neck is supple, no carotid bruits are noted.    Skin: Extremities are without significant edema.  Musculoskeletal:  Back is minimally tender over SI joint.   Positive right Faber sign.   Legs are nontender  Neurologic Exam  Mental status: The patient is alert and oriented x 3 at the time of the examination. The patient has apparent normal recent and remote memory, with an apparently normal attention span and concentration ability.   Speech is normal.  Cranial nerves: Extraocular movements are full.   There is reduced right facial sensation to temperature.Facial strength is normal.  Trapezius and sternocleidomastoid strength is normal. No dysarthria is noted.  No obvious hearing deficits are noted.  Motor:  Muscle bulk is normal.   Tone is normal. Strength is  5 / 5 in all 4 extremities.   Sensory: Sensory testing is intact to temperature, soft touch and vibration sensation   Coordination: Cerebellar testing reveals good finger-nose-finger and heel-to-shin bilaterally.  Gait and station: Station is normal.   Gait is mildly wide. Tandem gait is wide. Romberg is negative.   Reflexes: Deep tendon reflexes are symmetric and 2+ arms and 3+ knees.   Plantar responses are flexor.    DIAGNOSTIC DATA (LABS, IMAGING, TESTING) - I reviewed patient records, labs, notes, testing and imaging myself where available.  ASSESSMENT AND PLAN  Multiple sclerosis ([place])  Right hip pain - Plan: DG Si Joints, DG Arthro Hip Right, AMB referral to  orthopedics  Gait disturbance  Other fatigue  Degeneration of lumbar or lumbosacral intervertebral disc  Mixed incontinence    1.   Right buttock and hip pain appears to be musculoskeletal and not directly related to the MS she has not improved with medication. We will check x-rays of the right hip and SI joint and referred to orthopedics for further evaluation.  2.  She will continue Tysabri for MS 3.  Myrbetriq 50 mg for bladder 4.   Change Venlafaxine to Cymbalta as it may help pain more, renew Flexeril and hydrocodone (takes < 1/day) 5.  Return in 3-4 months or sooner if there are new or worsening neurologic symptoms.   Mckaela Howley A. Felecia Shelling, MD, PhD [id number], [id number] Certified in Neurology, Clinical Neurophysiology, Sleep Medicine, Pain Medicine and Neuroimaging  [facility] [address] [place], [place] [address] [phone]

## 2015-04-10 NOTE — Patient Instructions (Signed)
Stop the Effexor and start Cymbalta one pill daily. Somebody should call you in the next one or 2 business days to schedule the x-rays of the hip and SI joints and also to refer you to orthopedics.

## 2015-04-22 ENCOUNTER — Ambulatory Visit
Admission: RE | Admit: 2015-04-22 | Discharge: 2015-04-22 | Disposition: A | Discharge status: Home / Self Care | Payer: PRIVATE HEALTH INSURANCE | Source: Ambulatory Visit | Attending: Neurology | Admitting: Neurology

## 2015-04-22 ENCOUNTER — Other Ambulatory Visit: Payer: Self-pay | Admitting: Neurology

## 2015-04-22 DIAGNOSIS — M25559 Pain in unspecified hip: Secondary | ICD-10-CM

## 2015-04-22 DIAGNOSIS — M25551 Pain in right hip: Secondary | ICD-10-CM

## 2015-04-23 ENCOUNTER — Telehealth: Payer: Self-pay | Admitting: *Deleted

## 2015-04-23 ENCOUNTER — Encounter: Payer: Self-pay | Admitting: Neurology

## 2015-04-23 NOTE — Telephone Encounter (Signed)
I have spoken with [facility] and changed order to x-ray right hip/fim

## 2015-04-23 NOTE — Telephone Encounter (Signed)
-----   Message from Otilio Saber sent at 04/22/2015  2:15 PM EST ----- [facility] imaging called and stated that the referral for hip Xray needed to be changed from left hip to the patients right hip. Please call and advise. [phone]).

## 2015-04-24 ENCOUNTER — Encounter: Payer: Self-pay | Admitting: Neurology

## 2015-04-26 ENCOUNTER — Other Ambulatory Visit: Payer: Self-pay | Admitting: Neurology

## 2015-04-26 DIAGNOSIS — M25551 Pain in right hip: Secondary | ICD-10-CM

## 2015-04-27 ENCOUNTER — Encounter: Payer: Self-pay | Admitting: Neurology

## 2015-04-27 ENCOUNTER — Ambulatory Visit
Admission: RE | Admit: 2015-04-27 | Discharge: 2015-04-27 | Disposition: A | Discharge status: Home / Self Care | Payer: PRIVATE HEALTH INSURANCE | Source: Ambulatory Visit | Attending: Neurology | Admitting: Neurology

## 2015-04-27 DIAGNOSIS — M25551 Pain in right hip: Secondary | ICD-10-CM

## 2015-04-28 ENCOUNTER — Telehealth: Payer: Self-pay | Admitting: *Deleted

## 2015-04-28 NOTE — Telephone Encounter (Signed)
I have spoken with Helen Sandoval this morning and per RAS, advised that x-ray of right hip and si joints was normal, but that pain may still be due to bursitis.  I have offered ortho referral, but she sts. she already has an appt. at [facility] on Friday/fim

## 2015-04-28 NOTE — Telephone Encounter (Signed)
-----   Message from Britt Bottom, MD sent at 04/27/2015  5:23 PM EST ----- Please let her know that the right hip and the SI joints looked normal on x-ray. Pain can still be coming from bursitis or other soft tissue. If she would like we can refer her to orthopedics.

## 2015-05-08 ENCOUNTER — Ambulatory Visit: Payer: No Typology Code available for payment source | Admitting: Neurology

## 2015-05-12 ENCOUNTER — Other Ambulatory Visit: Payer: Self-pay | Admitting: Neurology

## 2015-05-12 DIAGNOSIS — M79605 Pain in left leg: Principal | ICD-10-CM

## 2015-05-12 DIAGNOSIS — M79604 Pain in right leg: Secondary | ICD-10-CM

## 2015-05-12 MED ORDER — AMPHETAMINE-DEXTROAMPHETAMINE 10 MG PO TABS: 10.0000 mg | ORAL_TABLET | Freq: Two times a day (BID) | ORAL | Status: DC

## 2015-05-12 MED ORDER — TRAMADOL HCL 50 MG PO TABS: 50.0000 mg | ORAL_TABLET | Freq: Two times a day (BID) | ORAL | Status: DC | PRN

## 2015-06-05 ENCOUNTER — Telehealth: Payer: Self-pay | Admitting: Neurology

## 2015-06-05 NOTE — Telephone Encounter (Signed)
[place] with [employer] is calling. She states the patient does not have insurance with [employer] or [employer] and therefore the Rx for natalizumab (TYSABRI) 300 MG/15ML injection cannot be shipped.

## 2015-06-05 NOTE — Telephone Encounter (Signed)
Message printed and given to Otila Kluver in the infusion suite, so that [facility] can address this/fim

## 2015-06-30 ENCOUNTER — Encounter: Payer: Self-pay | Admitting: Neurology

## 2015-07-03 ENCOUNTER — Encounter: Payer: Self-pay | Admitting: *Deleted

## 2015-07-07 ENCOUNTER — Encounter: Payer: Self-pay | Admitting: *Deleted

## 2015-07-07 ENCOUNTER — Other Ambulatory Visit: Payer: Self-pay | Admitting: *Deleted

## 2015-07-07 MED ORDER — AMPHETAMINE-DEXTROAMPHETAMINE 10 MG PO TABS: 10.0000 mg | ORAL_TABLET | Freq: Two times a day (BID) | ORAL | Status: DC

## 2015-07-07 NOTE — Telephone Encounter (Signed)
Adderall rx. provided at infusion appt/fim

## 2015-07-13 ENCOUNTER — Ambulatory Visit (INDEPENDENT_AMBULATORY_CARE_PROVIDER_SITE_OTHER): Payer: BLUE CROSS/BLUE SHIELD | Admitting: Neurology

## 2015-07-13 ENCOUNTER — Ambulatory Visit: Payer: No Typology Code available for payment source | Admitting: Neurology

## 2015-07-13 ENCOUNTER — Encounter: Payer: Self-pay | Admitting: Neurology

## 2015-07-13 VITALS — BP 122/84 | HR 82 | Resp 16 | Ht 64.0 in | Wt 194.4 lb

## 2015-07-13 DIAGNOSIS — R269 Unspecified abnormalities of gait and mobility: Secondary | ICD-10-CM | POA: Diagnosis not present

## 2015-07-13 DIAGNOSIS — G35 Multiple sclerosis: Secondary | ICD-10-CM

## 2015-07-13 DIAGNOSIS — F418 Other specified anxiety disorders: Secondary | ICD-10-CM | POA: Diagnosis not present

## 2015-07-13 DIAGNOSIS — G0491 Myelitis, unspecified: Secondary | ICD-10-CM

## 2015-07-13 DIAGNOSIS — R5383 Other fatigue: Secondary | ICD-10-CM

## 2015-07-13 DIAGNOSIS — F32A Depression, unspecified: Secondary | ICD-10-CM

## 2015-07-13 DIAGNOSIS — F329 Major depressive disorder, single episode, unspecified: Secondary | ICD-10-CM

## 2015-07-13 DIAGNOSIS — G35D Multiple sclerosis, unspecified: Secondary | ICD-10-CM

## 2015-07-13 DIAGNOSIS — N3946 Mixed incontinence: Secondary | ICD-10-CM

## 2015-07-13 DIAGNOSIS — F419 Anxiety disorder, unspecified: Secondary | ICD-10-CM

## 2015-07-13 DIAGNOSIS — G4730 Sleep apnea, unspecified: Secondary | ICD-10-CM

## 2015-07-13 MED ORDER — AMPHETAMINE-DEXTROAMPHETAMINE 10 MG PO TABS: 10.0000 mg | ORAL_TABLET | Freq: Two times a day (BID) | ORAL | Status: DC

## 2015-07-13 NOTE — Progress Notes (Signed)
[facility]  PATIENT: Helen Sandoval DOB: [birth year]  REFERRING DOCTOR OR PCP:  Otto Herb. ([facility])   Sinclair Ship (new PCP at [facility]) SOURCE: patient and records from [facility]  _________________________________   HISTORICAL  CHIEF COMPLAINT:  Chief Complaint  Patient presents with  . Multiple Sclerosis    Sts. she continues to tolerate Tysabri well.  JCV ab last checked 02-19-15 and was indeterminate at 0.32.  Today she c/o increased wt. gain, incrased h/a's since starting Cymbalta.  She would like to discuss switching back to Effexor.  She also c/o increased lower back and right hip pain.  She is being treated at [facility] for this/fim  . Back Pain  . Disability Paperwork    Sts. she needs today's office notes faxed to [employer] to the attn: of [employer] # [phone].  Marland Kitchen Sleep Apnea    Sts. she has been treated in the past by [facility] for osa.  She uses BIPAP and gets her supplies online.  She is requesting to transfer care of OSA to Dr. Felecia Shelling.  The next time she is in the office she will bring the chip from her BIPAP machine for a download./fim    HISTORY OF PRESENT ILLNESS:  Helen Sandoval is a 55 yo woman who was diagnosed with MS in 2015.     She is on Tysabri therapy and doing well.     Her JCV antibody status was 0.47 May 2016 (low positive) and 0.32 02/19/15.    She has had right leg/hip pain x 6 months and was diagnosed with bursitis and a herniated disc.    Pain is mostly in the right thigh/hip and buttock and not in her feet.    Pain is deep and pain is worse when she first stands up. Climbing stairs or putting weight on her right leg increases the pain.    NSAID's have not helped.   Steroid injections helped a few weeks.          Gait/strength/sensation:   Gait is stable and she has no recent falls.    Gait is worse with pain.  Her right side feels weaker than her left    She drags her right leg and her  balance is off leading to gait issues.   Hand numbness resolved  Bladder/bowel:     She was diagnosed with non-sensory incontinence and is doing much better since we increased the Myrbetriq.    No recent bowel incontinence.  Vision:    She has blurry vision out of both eyes that seems intermittent.    She has occasional diplopia.     Fatigue/sleep:   She feels fatigue is much better since starting Tysabri. Fatigue is both physical and cognitive.  Fatigue is worse with heat.    Adderall helps some.    She has difficulty with both sleep onset and sleep maintenance and trazodone has helped.           Mood/cognition:  Mood did better on Effexor and Wellbutrin than Cymbalta ans she would like to switch back.   .      She also had anxiety.    She notes difficulty with focus and has had some difficulty with verbal fluency and short term memory.      Adderall only helps a little bit.  OSA:    She has OSA.   She was told OSA was severe.    She is on BiPAP  but is unsure of pressures.   She uses BiPAP every night for > 4 hours.    On rare days she has not used it, she is less alert.    She denies excessive daytime sleepiness.     MS History:   In 2008, she had headaches and poor balance and was told the MRI had some white matter foci at that time.    She had spinal stenosis and needed surgery in 2014.  The surgeon felt her gait and other issues were decreased out of proportion to the extent of spine disease and referred her to Neurology.   Besides gait issues, she also had double vision, blurry vision, cognitive difficulty and incontinence in early 2015. She saw Dr. Pamalee Leyden in [place]. An MRI of the brain, CSF and visual evoked potentials were consistent with multiple sclerosis.    Initially, she was placed on Tecfidera. Due to progression with more neurologic symptoms, she was switched to Tysabri.   Her last MRI was done 10/2014 but we do not have the images.     REVIEW OF SYSTEMS: Constitutional: No fevers,  chills, sweats, or change in appetite.   She notes fatigue Eyes: Reports visual changes and double vision.  No eye pain Ear, nose and throat: No hearing loss, ear pain, nasal congestion, sore throat Cardiovascular: No chest pain, palpitations Respiratory: No shortness of breath at rest or with exertion.   She has snoring and coughing but no wheezes GastrointestinaI: No nausea, vomiting, abdominal pain.  She notes diarrhea and some fecal incontinence Genitourinary: No dysuria, urinary retention or frequency. She has had some incontinence Musculoskeletal: No neck pain, back pain Integumentary: No rash, pruritus, skin lesions Neurological: as above Psychiatric: Some depression at this time.  Some anxiety Endocrine: No palpitations, diaphoresis, change in appetite, change in weigh or increased thirst Hematologic/Lymphatic: No anemia, purpura, petechiae. Allergic/Immunologic: No itchy/runny eyes, nasal congestion, recent allergic reactions, rashes  ALLERGIES: Allergies  Allergen Reactions  . Clindamycin Anaphylaxis  . Indomethacin Hives  . Levofloxacin Hives  . Penicillins Anaphylaxis  . Penicillins Cross Reactors Anaphylaxis  . Sulfa Antibiotics Swelling    Blister (steven john syndrome)  . Levaquin [Levofloxacin Hemihydrate] Hives  . Sulfamethoxazole-Trimethoprim   . Zocor [Simvastatin] Other (See Comments)    Other reaction(s): MUSCLE PAIN Muscle aches    HOME MEDICATIONS:  Current outpatient prescriptions:  .  albuterol (VENTOLIN HFA) 108 (90 BASE) MCG/ACT inhaler, Inhale into the lungs., Disp: , Rfl:  .  ALPRAZolam (XANAX) 0.5 MG tablet, Take 0.25 mg by mouth., Disp: , Rfl:  .  amphetamine-dextroamphetamine (ADDERALL) 10 MG tablet, Take 1 tablet (10 mg total) by mouth 2 (two) times daily., Disp: 60 tablet, Rfl: 0 .  aspirin EC 81 MG tablet, Take 81 mg by mouth every other day.  , Disp: , Rfl:  .  buPROPion (WELLBUTRIN SR) 150 MG 12 hr tablet, , Disp: , Rfl:  .  Calcium  Carbonate-Vit D-Min (CALCIUM 1200 PO), Take 1 tablet by mouth daily.  , Disp: , Rfl:  .  cholecalciferol (VITAMIN D) 1000 UNITS tablet, Take 5,000 Units by mouth daily.  , Disp: , Rfl:  .  cyclobenzaprine (FLEXERIL) 5 MG tablet, Take 1 tablet (5 mg total) by mouth 3 (three) times daily as needed for muscle spasms., Disp: 90 tablet, Rfl: 3 .  DULERA 200-5 MCG/ACT AERO, , Disp: , Rfl:  .  DULoxetine (CYMBALTA) 60 MG capsule, Take 1 capsule (60 mg total) by mouth daily., Disp: 60 capsule,  Rfl: 5 .  estradiol (CLIMARA - DOSED IN MG/24 HR) 0.05 mg/24hr patch, , Disp: , Rfl:  .  ezetimibe (ZETIA) 10 MG tablet, Take 10 mg by mouth., Disp: , Rfl:  .  FLUARIX QUADRIVALENT 0.5 ML injection, , Disp: , Rfl:  .  gabapentin (NEURONTIN) 300 MG capsule, TAKE ONE CAPSULE BY MOUTH THREE TIMES DAILY, Disp: 90 capsule, Rfl: 3 .  HYDROcodone-acetaminophen (VICODIN) 5-325 mg TABS tablet, 1 tablet as needed every 8 hours, Disp: 60 tablet, Rfl: 0 .  Krill Oil 1000 MG CAPS, Take 1,000 mg by mouth., Disp: , Rfl:  .  mirabegron ER (MYRBETRIQ) 50 MG TB24 tablet, Take 1 tablet (50 mg total) by mouth daily., Disp: 30 tablet, Rfl: 11 .  natalizumab (TYSABRI) 300 MG/15ML injection, Inject 300 mg into the vein., Disp: , Rfl:  .  omeprazole (PRILOSEC) 20 MG capsule, Take 20 mg by mouth., Disp: , Rfl:  .  traMADol (ULTRAM) 50 MG tablet, Take 1 tablet (50 mg total) by mouth every 12 (twelve) hours as needed., Disp: 60 tablet, Rfl: 3 .  traZODone (DESYREL) 100 MG tablet, Take 1 tablet (100 mg total) by mouth at bedtime., Disp: 30 tablet, Rfl: 11 .  TURMERIC PO, Take by mouth., Disp: , Rfl:  .  vitamin C (ASCORBIC ACID) 500 MG tablet, Take 500 mg by mouth daily.  , Disp: , Rfl:   PAST MEDICAL HISTORY: Past Medical History  Diagnosis Date  . Sleep apnea   . Vitamin D deficiency   . Spina bifida   . Scarlet fever   . Shingles   . Ankle fracture   . Osteopenia     PAST SURGICAL HISTORY: Past Surgical History  Procedure  Laterality Date  . Abdominal hysterectomy      FAMILY HISTORY: History reviewed. No pertinent family history.  SOCIAL HISTORY:  Social History   Social History  . Marital Status: Divorced    Spouse Name: N/A  . Number of Children: N/A  . Years of Education: N/A   Occupational History  . Not on file.   Social History Main Topics  . Smoking status: Current Some Day Smoker  . Smokeless tobacco: Not on file  . Alcohol Use: No  . Drug Use: No  . Sexual Activity: Not on file   Other Topics Concern  . Not on file   Social History Narrative     PHYSICAL EXAM  Filed Vitals:   07/13/15 0947  BP: 122/84  Pulse: 82  Resp: 16  Height: 5\' 4"  (1.626 m)  Weight: 194 lb 6.4 oz (88.179 kg)    Body mass index is 33.35 kg/(m^2).   General: The patient is well-developed and well-nourished and in no acute distress  Neck: The neck is supple, no carotid bruits are noted.    Skin: Extremities are without significant edema.  Musculoskeletal:  Back is minimally tender over SI joint. Right hip tender over trochanteric bursa.     Positive right Faber sign.   Legs are nontender  Neurologic Exam  Mental status: The patient is alert and oriented x 3 at the time of the examination. The patient has apparent normal recent and remote memory, with an apparently normal attention span and concentration ability.   Speech is normal.  Cranial nerves: Extraocular movements are full.   There is reduced right facial sensation to temperature.Facial strength is normal.  Trapezius and sternocleidomastoid strength is normal. No dysarthria is noted.  No obvious hearing deficits are noted.  Motor:  Muscle bulk is normal.   Tone is normal. Strength is  5 / 5 in all 4 extremities.   Sensory: Sensory testing is intact to temperature, soft touch and vibration sensation   Coordination: Cerebellar testing reveals good finger-nose-finger and reduced heel-to-shin bilaterally.  Gait and station: Station is  normal.   Gait is mildly wide. Tandem gait is wide. Romberg is negative.   Reflexes: Deep tendon reflexes are symmetric and 2+ arms and 3+ knees with spread.   Marland Kitchen    DIAGNOSTIC DATA (LABS, IMAGING, TESTING) - I reviewed patient records, labs, notes, testing and imaging myself where available.       ASSESSMENT AND PLAN   Multiple sclerosis (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  She will continue Tysabri for MS.   we will recheck the JCV antibody.   She has been low positive and borderline on the past 2 tests. 2.   Change Cymbalta back to Effexor.   Renew Adderall for fatigue, cognition 3.  Continue Myrbetriq 50 mg for bladder 4.   Continue other med's.    Continue BiPAP 5.   Due to a combination of her physical and cognitive impairments as well as her fatigue, she is unable to work. I will send a copy of this note to [facility] attention Truett Perna.    [phone]  Return in 4 months or sooner if there are new or worsening neurologic symptoms.   Naila Elizondo A. Felecia Shelling, MD, PhD [id number], [id number] AM Certified in Neurology, Clinical Neurophysiology, Sleep Medicine, Pain Medicine and Neuroimaging . [facility] [address] [place], [place] [address] [phone]

## 2015-07-14 LAB — CBC WITH DIFFERENTIAL/PLATELET
BASOS ABS: 0 10*3/uL (ref 0.0–0.2)
BASOS: 0 %
EOS (ABSOLUTE): 0.1 10*3/uL (ref 0.0–0.4)
EOS: 1 %
HEMATOCRIT: 43.9 % (ref 34.0–46.6)
HEMOGLOBIN: 14.8 g/dL (ref 11.1–15.9)
IMMATURE GRANS (ABS): 0.1 10*3/uL (ref 0.0–0.1)
Immature Granulocytes: 1 %
LYMPHS ABS: 2 10*3/uL (ref 0.7–3.1)
LYMPHS: 17 %
MCH: 30.4 pg (ref 26.6–33.0)
MCHC: 33.7 g/dL (ref 31.5–35.7)
MCV: 90 fL (ref 79–97)
MONOCYTES: 5 %
Monocytes Absolute: 0.5 10*3/uL (ref 0.1–0.9)
NEUTROS ABS: 9 10*3/uL — AB (ref 1.4–7.0)
Neutrophils: 76 %
Platelets: 187 10*3/uL (ref 150–379)
RBC: 4.87 x10E6/uL (ref 3.77–5.28)
RDW: 14.9 % (ref 12.3–15.4)
WBC: 11.6 10*3/uL — ABNORMAL HIGH (ref 3.4–10.8)

## 2015-07-14 LAB — TSH: TSH: 3.95 u[IU]/mL (ref 0.450–4.500)

## 2015-07-17 ENCOUNTER — Encounter: Payer: Self-pay | Admitting: *Deleted

## 2015-07-21 ENCOUNTER — Ambulatory Visit: Payer: No Typology Code available for payment source | Admitting: Neurology

## 2015-07-30 ENCOUNTER — Other Ambulatory Visit: Payer: Self-pay | Admitting: Obstetrics & Gynecology

## 2015-07-30 DIAGNOSIS — R928 Other abnormal and inconclusive findings on diagnostic imaging of breast: Secondary | ICD-10-CM

## 2015-08-06 ENCOUNTER — Ambulatory Visit
Admission: RE | Admit: 2015-08-06 | Discharge: 2015-08-06 | Disposition: A | Discharge status: Home / Self Care | Payer: BLUE CROSS/BLUE SHIELD | Source: Ambulatory Visit | Attending: Obstetrics & Gynecology | Admitting: Obstetrics & Gynecology

## 2015-08-06 DIAGNOSIS — R928 Other abnormal and inconclusive findings on diagnostic imaging of breast: Secondary | ICD-10-CM

## 2015-08-11 ENCOUNTER — Telehealth: Payer: Self-pay | Admitting: Neurology

## 2015-08-11 ENCOUNTER — Telehealth: Payer: Self-pay | Admitting: *Deleted

## 2015-08-11 NOTE — Telephone Encounter (Signed)
[place].  I am working on disability paperwork and have questions for her/fim

## 2015-08-25 NOTE — Telephone Encounter (Signed)
Pt said she picked up the packet today and in looking over the paper, she noticed one form that needs Dr Felecia Shelling signature. What would be a good day to come by and get a signature. She does not want to leave it and come back.

## 2015-08-26 NOTE — Telephone Encounter (Signed)
Signed form up front [facility]/fim

## 2015-08-26 NOTE — Telephone Encounter (Signed)
I have spoken with Helen Sandoval.  She is correct--RAS sig is missing from page 6.  He will sign this and I'll have it up front for her by lunch today.  She verbalized understanding of same/fim

## 2015-09-08 ENCOUNTER — Encounter: Payer: Self-pay | Admitting: Neurology

## 2015-09-24 ENCOUNTER — Other Ambulatory Visit: Payer: Self-pay | Admitting: Neurology

## 2015-10-16 ENCOUNTER — Other Ambulatory Visit: Payer: Self-pay | Admitting: Neurology

## 2015-10-28 ENCOUNTER — Other Ambulatory Visit: Payer: Self-pay | Admitting: Neurology

## 2015-10-28 ENCOUNTER — Telehealth: Payer: Self-pay | Admitting: Neurology

## 2015-10-28 ENCOUNTER — Encounter: Payer: Self-pay | Admitting: Neurology

## 2015-10-28 MED ORDER — AMPHETAMINE-DEXTROAMPHETAMINE 10 MG PO TABS: 10.0000 mg | ORAL_TABLET | Freq: Two times a day (BID) | ORAL | Status: DC

## 2015-10-28 NOTE — Telephone Encounter (Signed)
Patient called to request refill of amphetamine-dextroamphetamine (ADDERALL) 10 MG tablet, states "he usually gives me 3 at a time and I take them to the pharmacy".

## 2015-10-28 NOTE — Telephone Encounter (Signed)
Current plus 2 postdated Adderall rx's up front [facility]/fim

## 2015-10-28 NOTE — Telephone Encounter (Signed)
Rx's awaiting RAS sig/fim 

## 2015-11-10 ENCOUNTER — Ambulatory Visit (INDEPENDENT_AMBULATORY_CARE_PROVIDER_SITE_OTHER): Payer: BLUE CROSS/BLUE SHIELD | Admitting: Neurology

## 2015-11-10 ENCOUNTER — Encounter: Payer: Self-pay | Admitting: Neurology

## 2015-11-10 VITALS — BP 138/94 | HR 80 | Resp 18 | Ht 64.0 in | Wt 206.5 lb

## 2015-11-10 DIAGNOSIS — R5383 Other fatigue: Secondary | ICD-10-CM | POA: Diagnosis not present

## 2015-11-10 DIAGNOSIS — G35 Multiple sclerosis: Secondary | ICD-10-CM | POA: Diagnosis not present

## 2015-11-10 DIAGNOSIS — N3946 Mixed incontinence: Secondary | ICD-10-CM | POA: Diagnosis not present

## 2015-11-10 DIAGNOSIS — G4730 Sleep apnea, unspecified: Secondary | ICD-10-CM

## 2015-11-10 DIAGNOSIS — R269 Unspecified abnormalities of gait and mobility: Secondary | ICD-10-CM

## 2015-11-10 DIAGNOSIS — N3941 Urge incontinence: Secondary | ICD-10-CM

## 2015-11-10 MED ORDER — ALPRAZOLAM 0.5 MG PO TABS: 0.2500 mg | ORAL_TABLET | Freq: Two times a day (BID) | ORAL | Status: DC | PRN

## 2015-11-10 NOTE — Progress Notes (Signed)
[facility]  PATIENT: Helen Sandoval DOB: [birth year]  REFERRING DOCTOR OR PCP:  Otto Herb. (Neuro[facility])   Sinclair Ship (new PCP at [facility]) SOURCE: patient and records from [facility]  _________________________________   HISTORICAL  CHIEF COMPLAINT:  Chief Complaint  Patient presents with  . Multiple Sclerosis    Sts. she continues to tolerate Tysabri well.  JCV ab last checked 07-13-15 and was indeterminate at 0.40.  She brought BIPAP chip in today and it has been downloaded/fim    HISTORY OF PRESENT ILLNESS:  Tom Balvin is a 55 yo woman who was diagnosed with MS in 2015.     She is on Tysabri therapy and doing well.     Her JCV antibody status was 0.40 May 2016 (low positive) 07/13/15.    She had one episode of infusion reactions following Tysabri infusion 6 weeks ago with worsening symptoms x 1 week  Gait/strength/sensation:   Gait is stable and she has no recent falls.    Gait is worse with pain.  Her right side feels weaker than her left    She drags her right leg if she is tiredand her balance is off leading to gait issues.   Hand numbness resolved  Bladder/bowel:     She was diagnosed with non-sensory incontinence and is doing much better since we increased the Myrbetriq.    No recent bowel incontinence.  Vision:    She has blurry vision out of both eyes that seems intermittent.    She has occasional diplopia.     Fatigue/sleep:   She feels fatigue is much better since starting Tysabri. Fatigue is both physical and cognitive.  Fatigue is worse with heat.    Adderall helps some.    She was having difficulty with both sleep onset and sleep maintenance and trazodone and BiPAP has helped.           Mood/cognition:  Mood is on Effexor and Wellbutrin than Cymbalta.      She also had anxiety but it is not as bad.   .    She notes difficulty with focus and has had some difficulty with verbal fluency and short term memory.      Adderall only helps  a little bit.  OSA:    She has OSA.   She was told OSA was severe.    She is on BiPAP but is unsure of pressures.   She uses BiPAP every night for > 4 hours.        She denies excessive daytime sleepiness.      Compliance report 11/08/15 shows excellent compliance of 97% and efficacy with AHI only 3.2 on BiPAP 11/6.  Right leg pain is doing better,         MS History:   In 2008, she had headaches and poor balance and was told the MRI had some white matter foci at that time.    She had spinal stenosis and needed surgery in 2014.  The surgeon felt her gait and other issues were decreased out of proportion to the extent of spine disease and referred her to Neurology.   Besides gait issues, she also had double vision, blurry vision, cognitive difficulty and incontinence in early 2015. She saw Dr. Pamalee Leyden in [place]. An MRI of the brain, CSF and visual evoked potentials were consistent with multiple sclerosis.    Initially, she was placed on Tecfidera. Due to progression with more neurologic symptoms, she was switched to  Tysabri.   Her last MRI was done 10/2014 but we do not have the images.     REVIEW OF SYSTEMS: Constitutional: No fevers, chills, sweats, or change in appetite.   She notes fatigue Eyes: Reports visual changes and double vision.  No eye pain Ear, nose and throat: No hearing loss, ear pain, nasal congestion, sore throat Cardiovascular: No chest pain, palpitations Respiratory: No shortness of breath at rest or with exertion.   She has snoring and coughing but no wheezes GastrointestinaI: No nausea, vomiting, abdominal pain.  She notes diarrhea and some fecal incontinence Genitourinary: No dysuria, urinary retention or frequency. She has had some incontinence Musculoskeletal: No neck pain, back pain Integumentary: No rash, pruritus, skin lesions Neurological: as above Psychiatric: Some depression at this time.  Some anxiety Endocrine: No palpitations, diaphoresis, change in  appetite, change in weigh or increased thirst Hematologic/Lymphatic: No anemia, purpura, petechiae. Allergic/Immunologic: No itchy/runny eyes, nasal congestion, recent allergic reactions, rashes  ALLERGIES: Allergies  Allergen Reactions  . Clindamycin Anaphylaxis  . Indomethacin Hives  . Levofloxacin Hives  . Penicillins Anaphylaxis  . Penicillins Cross Reactors Anaphylaxis  . Sulfa Antibiotics Swelling    Blister (steven john syndrome)  . Levaquin [Levofloxacin Hemihydrate] Hives  . Sulfamethoxazole-Trimethoprim   . Zocor [Simvastatin] Other (See Comments)    Other reaction(s): MUSCLE PAIN Muscle aches    HOME MEDICATIONS:  Current outpatient prescriptions:  .  albuterol (VENTOLIN HFA) 108 (90 BASE) MCG/ACT inhaler, Inhale into the lungs., Disp: , Rfl:  .  ALPRAZolam (XANAX) 0.5 MG tablet, Take 0.25 mg by mouth., Disp: , Rfl:  .  amphetamine-dextroamphetamine (ADDERALL) 10 MG tablet, Take 1 tablet (10 mg total) by mouth 2 (two) times daily., Disp: 60 tablet, Rfl: 0 .  aspirin EC 81 MG tablet, Take 81 mg by mouth every other day.  , Disp: , Rfl:  .  buPROPion (WELLBUTRIN SR) 150 MG 12 hr tablet, Take 150 mg by mouth 2 (two) times daily. , Disp: , Rfl:  .  Calcium Carbonate-Vit D-Min (CALCIUM 1200 PO), Take 1 tablet by mouth daily.  , Disp: , Rfl:  .  cholecalciferol (VITAMIN D) 1000 UNITS tablet, Take 5,000 Units by mouth daily.  , Disp: , Rfl:  .  cyclobenzaprine (FLEXERIL) 5 MG tablet, TAKE ONE TABLET BY MOUTH THREE TIMES DAILY AS NEEDED, Disp: 90 tablet, Rfl: 5 .  DULERA 200-5 MCG/ACT AERO, , Disp: , Rfl:  .  estradiol (CLIMARA - DOSED IN MG/24 HR) 0.05 mg/24hr patch, , Disp: , Rfl:  .  ezetimibe (ZETIA) 10 MG tablet, Take 10 mg by mouth., Disp: , Rfl:  .  FLUARIX QUADRIVALENT 0.5 ML injection, , Disp: , Rfl:  .  gabapentin (NEURONTIN) 300 MG capsule, TAKE ONE CAPSULE BY MOUTH THREE TIMES DAILY, Disp: 90 capsule, Rfl: 0 .  HYDROcodone-acetaminophen (VICODIN) 5-325 mg TABS  tablet, 1 tablet as needed every 8 hours, Disp: 60 tablet, Rfl: 0 .  Krill Oil 1000 MG CAPS, Take 1,000 mg by mouth., Disp: , Rfl:  .  mirabegron ER (MYRBETRIQ) 50 MG TB24 tablet, Take 1 tablet (50 mg total) by mouth daily., Disp: 30 tablet, Rfl: 11 .  natalizumab (TYSABRI) 300 MG/15ML injection, Inject 300 mg into the vein., Disp: , Rfl:  .  omeprazole (PRILOSEC) 20 MG capsule, Take 20 mg by mouth., Disp: , Rfl:  .  traMADol (ULTRAM) 50 MG tablet, Take 1 tablet (50 mg total) by mouth every 12 (twelve) hours as needed., Disp: 60 tablet, Rfl:  3 .  traZODone (DESYREL) 100 MG tablet, Take 1 tablet (100 mg total) by mouth at bedtime., Disp: 30 tablet, Rfl: 11 .  TURMERIC PO, Take by mouth., Disp: , Rfl:  .  venlafaxine (EFFEXOR) 75 MG tablet, Take 75 mg by mouth daily., Disp: , Rfl:  .  venlafaxine XR (EFFEXOR-XR) 150 MG 24 hr capsule, Take 150 mg by mouth daily with breakfast., Disp: , Rfl:  .  vitamin C (ASCORBIC ACID) 500 MG tablet, Take 500 mg by mouth daily.  , Disp: , Rfl:   PAST MEDICAL HISTORY: Past Medical History  Diagnosis Date  . Sleep apnea   . Vitamin D deficiency   . Spina bifida   . Scarlet fever   . Shingles   . Ankle fracture   . Osteopenia     PAST SURGICAL HISTORY: Past Surgical History  Procedure Laterality Date  . Abdominal hysterectomy      FAMILY HISTORY: History reviewed. No pertinent family history.  SOCIAL HISTORY:  Social History   Social History  . Marital Status: Divorced    Spouse Name: N/A  . Number of Children: N/A  . Years of Education: N/A   Occupational History  . Not on file.   Social History Main Topics  . Smoking status: Current Some Day Smoker  . Smokeless tobacco: Not on file  . Alcohol Use: No  . Drug Use: No  . Sexual Activity: Not on file   Other Topics Concern  . Not on file   Social History Narrative     PHYSICAL EXAM  Filed Vitals:   11/10/15 1118  BP: 138/94  Pulse: 80  Resp: 18  Height: 5\' 4"  (1.626 m)    Weight: 206 lb 8 oz (93.668 kg)    Body mass index is 35.43 kg/(m^2).   General: The patient is well-developed and well-nourished and in no acute distress  Neck: The neck is supple with good ROM and nontender.    Skin: Extremities are without significant edema.  Musculoskeletal:  Back and hip are now non-tender with good ROM and no Faber sign  Neurologic Exam  Mental status: The patient is alert and oriented x 3 at the time of the examination. The patient has apparent normal recent and remote memory, with an apparently normal attention span and concentration ability.   Speech is normal.  Cranial nerves: Extraocular movements are full.   There is reduced right facial sensation to temperature.Facial strength is normal.  Trapezius and sternocleidomastoid strength is normal. No dysarthria is noted.  No obvious hearing deficits are noted.  Motor:  Muscle bulk is normal.   Tone is normal. Strength is  5 / 5 in all 4 extremities.   Sensory: Sensory testing is intact to temperature, soft touch and vibration sensation   Coordination: Cerebellar testing reveals good finger-nose-finger and reduced heel-to-shin bilaterally.  Gait and station: Station is normal.   Gait is mildly wide. Tandem gait is wide. Romberg is negative.   Reflexes: Deep tendon reflexes are symmetric and 2+ arms and 3+ knees with spread.   Marland Kitchen    DIAGNOSTIC DATA (LABS, IMAGING, TESTING) - I reviewed patient records, labs, notes, testing and imaging myself where available.       ASSESSMENT AND PLAN   Multiple sclerosis (HCC)  Apnea, sleep  Other fatigue  Mixed incontinence  Urge incontinence  Gait disturbance    1.  She will continue Tysabri for MS.   We will recheck the JCV antibody to make sure  low positive ahs not worsened.   2.   Continue Effexor.   Renew Adderall for fatigue, cognition.   Can take rare xanax for occasioanl anxiety 3.   Continue Myrbetriq 50 mg for bladder 4.   Continue other  med's.    Continue BiPAP 5.   Return in 6 months or sooner if there are new or worsening neurologic symptoms.   Johnanthony Wilden A. Felecia Shelling, MD, PhD [id number], [id number] AM Certified in Neurology, Clinical Neurophysiology, Sleep Medicine, Pain Medicine and Neuroimaging . [facility] [address], [place], [place] [address] [phone]

## 2015-11-11 ENCOUNTER — Other Ambulatory Visit: Payer: Self-pay | Admitting: Neurology

## 2015-11-11 LAB — CBC WITH DIFFERENTIAL/PLATELET
BASOS ABS: 0 10*3/uL (ref 0.0–0.2)
Basos: 1 %
EOS (ABSOLUTE): 0.2 10*3/uL (ref 0.0–0.4)
EOS: 2 %
HEMATOCRIT: 38.6 % (ref 34.0–46.6)
HEMOGLOBIN: 12.8 g/dL (ref 11.1–15.9)
IMMATURE GRANS (ABS): 0 10*3/uL (ref 0.0–0.1)
IMMATURE GRANULOCYTES: 1 %
LYMPHS ABS: 1.6 10*3/uL (ref 0.7–3.1)
LYMPHS: 26 %
MCH: 29.9 pg (ref 26.6–33.0)
MCHC: 33.2 g/dL (ref 31.5–35.7)
MCV: 90 fL (ref 79–97)
MONOCYTES: 7 %
Monocytes Absolute: 0.4 10*3/uL (ref 0.1–0.9)
NEUTROS PCT: 63 %
Neutrophils Absolute: 4 10*3/uL (ref 1.4–7.0)
Platelets: 145 10*3/uL — ABNORMAL LOW (ref 150–379)
RBC: 4.28 x10E6/uL (ref 3.77–5.28)
RDW: 15.2 % (ref 12.3–15.4)
WBC: 6.3 10*3/uL (ref 3.4–10.8)

## 2015-11-18 ENCOUNTER — Encounter: Payer: Self-pay | Admitting: *Deleted

## 2015-11-18 ENCOUNTER — Encounter: Payer: Self-pay | Admitting: Neurology

## 2015-12-17 ENCOUNTER — Other Ambulatory Visit: Payer: Self-pay | Admitting: Neurology

## 2015-12-17 DIAGNOSIS — M79604 Pain in right leg: Secondary | ICD-10-CM

## 2015-12-17 DIAGNOSIS — M79605 Pain in left leg: Principal | ICD-10-CM

## 2015-12-17 NOTE — Telephone Encounter (Signed)
Rx printed, awaiting MD signature.  

## 2015-12-17 NOTE — Telephone Encounter (Signed)
Signed and faxed to pharmacy

## 2016-01-05 ENCOUNTER — Other Ambulatory Visit: Payer: Self-pay | Admitting: Internal Medicine

## 2016-01-05 DIAGNOSIS — R1310 Dysphagia, unspecified: Secondary | ICD-10-CM

## 2016-01-06 ENCOUNTER — Ambulatory Visit
Admission: RE | Admit: 2016-01-06 | Discharge: 2016-01-06 | Disposition: A | Discharge status: Home / Self Care | Payer: BLUE CROSS/BLUE SHIELD | Source: Ambulatory Visit | Attending: Internal Medicine | Admitting: Internal Medicine

## 2016-01-06 DIAGNOSIS — R1310 Dysphagia, unspecified: Secondary | ICD-10-CM

## 2016-01-19 ENCOUNTER — Other Ambulatory Visit: Payer: Self-pay | Admitting: Neurology

## 2016-01-19 MED ORDER — AMPHETAMINE-DEXTROAMPHETAMINE 10 MG PO TABS: 10.0000 mg | ORAL_TABLET | Freq: Two times a day (BID) | ORAL | 0 refills | Status: DC

## 2016-02-09 ENCOUNTER — Other Ambulatory Visit: Payer: Self-pay | Admitting: Neurology

## 2016-02-17 ENCOUNTER — Other Ambulatory Visit: Payer: Self-pay | Admitting: Neurology

## 2016-02-17 MED ORDER — AMPHETAMINE-DEXTROAMPHETAMINE 10 MG PO TABS: 10.0000 mg | ORAL_TABLET | Freq: Two times a day (BID) | ORAL | 0 refills | Status: DC

## 2016-02-19 ENCOUNTER — Other Ambulatory Visit: Payer: Self-pay | Admitting: Neurology

## 2016-03-22 ENCOUNTER — Telehealth: Payer: Self-pay | Admitting: *Deleted

## 2016-03-22 MED ORDER — AMPHETAMINE-DEXTROAMPHETAMINE 10 MG PO TABS
10.0000 mg | ORAL_TABLET | Freq: Two times a day (BID) | ORAL | 0 refills | Status: DC
Start: 2016-03-22 — End: 2016-04-07

## 2016-03-22 NOTE — Telephone Encounter (Signed)
Rx. given directly to pt/fim

## 2016-04-06 ENCOUNTER — Encounter: Payer: Self-pay | Admitting: *Deleted

## 2016-04-07 ENCOUNTER — Encounter: Payer: Self-pay | Admitting: Neurology

## 2016-04-07 ENCOUNTER — Other Ambulatory Visit: Payer: Self-pay | Admitting: Neurology

## 2016-04-07 ENCOUNTER — Other Ambulatory Visit: Payer: Self-pay | Admitting: *Deleted

## 2016-04-07 MED ORDER — AMPHETAMINE-DEXTROAMPHETAMINE 10 MG PO TABS
10.0000 mg | ORAL_TABLET | Freq: Two times a day (BID) | ORAL | 0 refills | Status: DC
Start: 2016-04-07 — End: 2016-05-09

## 2016-04-13 ENCOUNTER — Encounter: Payer: Self-pay | Admitting: *Deleted

## 2016-05-07 ENCOUNTER — Encounter: Payer: Self-pay | Admitting: Neurology

## 2016-05-09 ENCOUNTER — Telehealth: Payer: Self-pay | Admitting: *Deleted

## 2016-05-09 MED ORDER — AMPHETAMINE-DEXTROAMPHETAMINE 10 MG PO TABS: 10.0000 mg | ORAL_TABLET | Freq: Two times a day (BID) | ORAL | 0 refills | Status: DC

## 2016-05-09 NOTE — Telephone Encounter (Signed)
Adderall rx. up front [facility]/fim 

## 2016-05-11 ENCOUNTER — Ambulatory Visit (INDEPENDENT_AMBULATORY_CARE_PROVIDER_SITE_OTHER): Payer: BLUE CROSS/BLUE SHIELD | Admitting: Neurology

## 2016-05-11 ENCOUNTER — Encounter: Payer: Self-pay | Admitting: Neurology

## 2016-05-11 VITALS — BP 124/77 | HR 80 | Resp 18 | Ht 64.0 in | Wt 212.0 lb

## 2016-05-11 DIAGNOSIS — F418 Other specified anxiety disorders: Secondary | ICD-10-CM | POA: Diagnosis not present

## 2016-05-11 DIAGNOSIS — R5383 Other fatigue: Secondary | ICD-10-CM | POA: Diagnosis not present

## 2016-05-11 DIAGNOSIS — N3941 Urge incontinence: Secondary | ICD-10-CM | POA: Diagnosis not present

## 2016-05-11 DIAGNOSIS — R269 Unspecified abnormalities of gait and mobility: Secondary | ICD-10-CM

## 2016-05-11 DIAGNOSIS — G35 Multiple sclerosis: Secondary | ICD-10-CM

## 2016-05-11 DIAGNOSIS — G4730 Sleep apnea, unspecified: Secondary | ICD-10-CM | POA: Diagnosis not present

## 2016-05-11 DIAGNOSIS — F329 Major depressive disorder, single episode, unspecified: Secondary | ICD-10-CM

## 2016-05-11 DIAGNOSIS — F419 Anxiety disorder, unspecified: Secondary | ICD-10-CM

## 2016-05-11 DIAGNOSIS — F32A Depression, unspecified: Secondary | ICD-10-CM

## 2016-05-11 MED ORDER — AMPHETAMINE-DEXTROAMPHETAMINE 10 MG PO TABS: 10.0000 mg | ORAL_TABLET | Freq: Three times a day (TID) | ORAL | 0 refills | Status: DC

## 2016-05-11 NOTE — Progress Notes (Signed)
[facility]  PATIENT: Helen Sandoval DOB: 1960-12-20  REFERRING DOCTOR OR PCP:  Helen Herb. (Neuro[facility])   Helen Sandoval (new PCP at [facility]) SOURCE: patient and records from [facility]  _________________________________   HISTORICAL  CHIEF COMPLAINT:  Chief Complaint  Patient presents with  . Multiple Sclerosis    Sts. she continues to tolerate Tysabri well.  JCV ab last checked 11-11-15 and was indeterminate at 0.33.  Sts. she feels memory/cognition is worse/fim    HISTORY OF PRESENT ILLNESS:  Helen Sandoval is a 55 yo woman who was diagnosed with MS in 2015.       MS:   She is on Tysabri therapy and doing well.     Her JCV antibody status was indeterminate (0.33) 11/11/15.. She denies definite exacerbation but notes more cognitive issues and visual blurring.  Gait/strength/sensation:   Gait is stable and she has one recent fall.    Gait is worse with pain.  Her right side feels weaker than her left    She drags her right leg if she is tired and her balance is off leading to gait issues.   Hand numbness resolved  Bladder/bowel:     She was diagnosed with non-sensory incontinence and is doing much better since we increased the Myrbetriq.    No recent bowel incontinence.  Vision:    When she reads, at times, she has transient blurry vision and sometimes diplopia.   This will last 30-60 minutes then improve if she stops reading. No eye pain.    Fatigue/sleep:   She feels fatigue is much better since starting Tysabri. Fatigue is both physical and cognitive.  Fatigue is worse with heat and the last week of each cycle    Adderall helps fatigue more than the cognitive issues.    She was having difficulty with both sleep onset and sleep maintenance and trazodone and BiPAP has helped.           Mood/cognition:  Mood is on Effexor and Wellbutrin than Cymbalta.      Anxiety is mild.   She notes difficulty with focus and has had some difficulty with verbal  fluency and short term memory.      Adderall only helps a little bit.  OSA:    She has OSA.   She was told OSA was severe.    She is on BiPAP but is unsure of pressures.   She uses BiPAP every night for > 4 hours.        She denies excessive daytime sleepiness.      Compliance report 11/08/15 shows excellent compliance of 97% and efficacy with AHI only 3.2 on BiPAP 11/6.    MS History:   In 2008, she had headaches and poor balance and was told the MRI had some white matter foci at that time.    She had spinal stenosis and needed surgery in 2014.  The surgeon felt her gait and other issues were decreased out of proportion to the extent of spine disease and referred her to Neurology.   Besides gait issues, she also had double vision, blurry vision, cognitive difficulty and incontinence in early 2015. She saw Dr. Pamalee Sandoval in [place]. An MRI of the brain, CSF and visual evoked potentials were consistent with multiple sclerosis.    Initially, she was placed on Tecfidera. Due to progression with more neurologic symptoms, she was switched to Tysabri.   Her last MRI was done 10/2014 but we do not  have the images.     REVIEW OF SYSTEMS: Constitutional: No fevers, chills, sweats, or change in appetite.   She notes fatigue Eyes: Reports visual changes and double vision.  No eye pain Ear, nose and throat: No hearing loss, ear pain, nasal congestion, sore throat Cardiovascular: No chest pain, palpitations Respiratory: No shortness of breath at rest or with exertion.   She has snoring and coughing but no wheezes GastrointestinaI: No nausea, vomiting, abdominal pain.  She notes diarrhea and some fecal incontinence Genitourinary: No dysuria, urinary retention or frequency. She has had some incontinence Musculoskeletal: No neck pain, back pain Integumentary: No rash, pruritus, skin lesions Neurological: as above Psychiatric: Some depression at this time.  Some anxiety Endocrine: No palpitations, diaphoresis,  change in appetite, change in weigh or increased thirst Hematologic/Lymphatic: No anemia, purpura, petechiae. Allergic/Immunologic: No itchy/runny eyes, nasal congestion, recent allergic reactions, rashes  ALLERGIES: Allergies  Allergen Reactions  . Clindamycin Anaphylaxis  . Indomethacin Hives  . Levofloxacin Hives  . Penicillins Anaphylaxis  . Penicillins Cross Reactors Anaphylaxis  . Sulfa Antibiotics Swelling    Blister (steven john syndrome)  . Levaquin [Levofloxacin Hemihydrate] Hives  . Sulfamethoxazole-Trimethoprim   . Zocor [Simvastatin] Other (See Comments)    Other reaction(s): MUSCLE PAIN Muscle aches    HOME MEDICATIONS:  Current Outpatient Prescriptions:  .  albuterol (VENTOLIN HFA) 108 (90 BASE) MCG/ACT inhaler, Inhale into the lungs., Disp: , Rfl:  .  ALPRAZolam (XANAX) 0.5 MG tablet, Take 0.5 tablets (0.25 mg total) by mouth 2 (two) times daily as needed for anxiety., Disp: 60 tablet, Rfl: 1 .  amphetamine-dextroamphetamine (ADDERALL) 10 MG tablet, Take 1 tablet (10 mg total) by mouth 3 (three) times daily., Disp: 90 tablet, Rfl: 0 .  aspirin EC 81 MG tablet, Take 81 mg by mouth every other day.  , Disp: , Rfl:  .  buPROPion (WELLBUTRIN SR) 150 MG 12 hr tablet, Take 150 mg by mouth 2 (two) times daily. , Disp: , Rfl:  .  cholecalciferol (VITAMIN D) 1000 UNITS tablet, Take 5,000 Units by mouth daily.  , Disp: , Rfl:  .  cyclobenzaprine (FLEXERIL) 5 MG tablet, TAKE ONE TABLET BY MOUTH THREE TIMES DAILY AS NEEDED, Disp: 90 tablet, Rfl: 5 .  DULERA 200-5 MCG/ACT AERO, , Disp: , Rfl:  .  estradiol (CLIMARA - DOSED IN MG/24 HR) 0.05 mg/24hr patch, , Disp: , Rfl:  .  ezetimibe (ZETIA) 10 MG tablet, Take 10 mg by mouth., Disp: , Rfl:  .  FLUARIX QUADRIVALENT 0.5 ML injection, , Disp: , Rfl:  .  gabapentin (NEURONTIN) 300 MG capsule, TAKE ONE CAPSULE BY MOUTH THREE TIMES DAILY, Disp: 90 capsule, Rfl: 5 .  HYDROcodone-acetaminophen (VICODIN) 5-325 mg TABS tablet, 1 tablet  as needed every 8 hours, Disp: 60 tablet, Rfl: 0 .  Krill Oil 1000 MG CAPS, Take 1,000 mg by mouth., Disp: , Rfl:  .  MYRBETRIQ 50 MG TB24 tablet, TAKE ONE TABLET BY MOUTH ONCE DAILY, Disp: 30 tablet, Rfl: 11 .  natalizumab (TYSABRI) 300 MG/15ML injection, Inject 300 mg into the vein., Disp: , Rfl:  .  omeprazole (PRILOSEC) 20 MG capsule, Take 20 mg by mouth., Disp: , Rfl:  .  traMADol (ULTRAM) 50 MG tablet, TAKE ONE TABLET BY MOUTH EVERY 12 HOURS AS NEEDED, Disp: 60 tablet, Rfl: 3 .  traZODone (DESYREL) 100 MG tablet, TAKE ONE TABLET BY MOUTH AT BEDTIME, Disp: 30 tablet, Rfl: 11 .  TURMERIC PO, Take by mouth., Disp: ,  Rfl:  .  venlafaxine (EFFEXOR) 75 MG tablet, Take 75 mg by mouth daily., Disp: , Rfl:  .  venlafaxine XR (EFFEXOR-XR) 150 MG 24 hr capsule, Take 150 mg by mouth daily with breakfast., Disp: , Rfl:  .  Calcium Carbonate-Vit D-Min (CALCIUM 1200 PO), Take 1 tablet by mouth daily.  , Disp: , Rfl:  .  vitamin C (ASCORBIC ACID) 500 MG tablet, Take 500 mg by mouth daily.  , Disp: , Rfl:   PAST MEDICAL HISTORY: Past Medical History:  Diagnosis Date  . Ankle fracture   . Osteopenia   . Scarlet fever   . Shingles   . Sleep apnea   . Spina bifida   . Vitamin D deficiency     PAST SURGICAL HISTORY: Past Surgical History:  Procedure Laterality Date  . ABDOMINAL HYSTERECTOMY      FAMILY HISTORY: No family history on file.  SOCIAL HISTORY:  Social History   Social History  . Marital status: Divorced    Spouse name: N/A  . Number of children: N/A  . Years of education: N/A   Occupational History  . Not on file.   Social History Main Topics  . Smoking status: Current Some Day Smoker  . Smokeless tobacco: Not on file  . Alcohol use No  . Drug use: No  . Sexual activity: Not on file   Other Topics Concern  . Not on file   Social History Narrative  . No narrative on file     PHYSICAL EXAM  Vitals:   05/11/16 1301  BP: 124/77  Pulse: 80  Resp: 18    Weight: 212 lb (96.2 kg)  Height: 5\' 4"  (1.626 m)    Body mass index is 36.39 kg/m.   General: The patient is well-developed and well-nourished and in no acute distress  Neck: The neck is supple with good ROM and nontender.      Neurologic Exam  Mental status: The patient is alert and oriented x 3 at the time of the examination. The patient has apparent normal recent and remote memory, with an apparently normal attention span and concentration ability.   Speech is normal.  Cranial nerves: Extraocular movements are full.   There is reduced right facial sensation to temperature.Facial strength is normal.  Trapezius and sternocleidomastoid strength is normal. No dysarthria is noted.  No obvious hearing deficits are noted.  Motor:  Muscle bulk is normal.   Tone is normal. Strength is  5 / 5 in all 4 extremities.   Sensory: Sensory testing is intact to temperature, soft touch and vibration sensation   Coordination: Cerebellar testing reveals good finger-nose-finger and reduced heel-to-shin bilaterally.  Gait and station: Station is normal.   Gait is mildly wide. Tandem gait is wide. Romberg is negative.   Reflexes: Deep tendon reflexes are symmetric and 2+ arms and 3+ knees with spread.   No ankle clonus.    DIAGNOSTIC DATA (LABS, IMAGING, TESTING) - I reviewed patient records, labs, notes, testing and imaging myself where available.       ASSESSMENT AND PLAN   Multiple sclerosis ([place]) - Plan: CBC with Differential/Platelet, Stratify JCV Antibody Test ([facility]), MR BRAIN W WO CONTRAST, MR CERVICAL SPINE W WO CONTRAST  Sleep apnea, unspecified type  Urge incontinence  Gait disturbance  Other fatigue  Anxiety and depression   1.  Continue Tysabri for MS.   We will recheck the JCV antibody to make sure low positive has not worsened.  She feels some symptoms are worse.  We will check an MRI of the brain and cervical spine to determine if there is any subclinical  progression. If this is occurring, we need to consider a change in her disease modifying therapy. 2.   Continue Effexor.   Increase Adderall for fatigue, cognition.    3.   Continue Myrbetriq 50 mg for bladder 4.   Continue BiPAP 5.   Return in 6 months or sooner if there are new or worsening neurologic symptoms.   Richard A. Felecia Shelling, MD, PhD [id number], [id number] PM Certified in Neurology, Clinical Neurophysiology, Sleep Medicine, Pain Medicine and Neuroimaging . [facility] [address], [place], [place] [address] [phone]

## 2016-05-12 LAB — CBC WITH DIFFERENTIAL/PLATELET
BASOS: 1 %
Basophils Absolute: 0 10*3/uL (ref 0.0–0.2)
EOS (ABSOLUTE): 0.1 10*3/uL (ref 0.0–0.4)
EOS: 2 %
Hematocrit: 38.2 % (ref 34.0–46.6)
Hemoglobin: 12.7 g/dL (ref 11.1–15.9)
IMMATURE GRANS (ABS): 0 10*3/uL (ref 0.0–0.1)
IMMATURE GRANULOCYTES: 1 %
Lymphocytes Absolute: 1.7 10*3/uL (ref 0.7–3.1)
Lymphs: 26 %
MCH: 30.2 pg (ref 26.6–33.0)
MCHC: 33.2 g/dL (ref 31.5–35.7)
MCV: 91 fL (ref 79–97)
MONOS ABS: 0.6 10*3/uL (ref 0.1–0.9)
Monocytes: 8 %
NEUTROS ABS: 4.3 10*3/uL (ref 1.4–7.0)
NEUTROS PCT: 62 %
Platelets: 152 10*3/uL (ref 150–379)
RBC: 4.2 x10E6/uL (ref 3.77–5.28)
RDW: 15.5 % — AB (ref 12.3–15.4)
WBC: 6.8 10*3/uL (ref 3.4–10.8)

## 2016-05-18 ENCOUNTER — Telehealth: Payer: Self-pay | Admitting: Neurology

## 2016-05-18 NOTE — Telephone Encounter (Signed)
Pt called wants to c/a MRI appt. [facility] said it would be cheaper to go to [place]. She needs the order faxed to [facility] at [phone]. Please call

## 2016-05-18 NOTE — Telephone Encounter (Signed)
I called the patient back and informed her that I will fax the order to [facility]

## 2016-05-19 ENCOUNTER — Telehealth: Payer: Self-pay | Admitting: *Deleted

## 2016-05-19 NOTE — Telephone Encounter (Signed)
Labs collected 05/11/16:  JCV ab 0.33 indeterminate

## 2016-05-25 ENCOUNTER — Other Ambulatory Visit: Payer: BLUE CROSS/BLUE SHIELD

## 2016-05-25 DIAGNOSIS — G35 Multiple sclerosis: Secondary | ICD-10-CM

## 2016-06-03 ENCOUNTER — Ambulatory Visit (INDEPENDENT_AMBULATORY_CARE_PROVIDER_SITE_OTHER): Payer: Self-pay

## 2016-06-03 DIAGNOSIS — Z0289 Encounter for other administrative examinations: Secondary | ICD-10-CM

## 2016-06-03 DIAGNOSIS — G35 Multiple sclerosis: Secondary | ICD-10-CM

## 2016-06-06 ENCOUNTER — Telehealth: Payer: Self-pay | Admitting: *Deleted

## 2016-06-06 ENCOUNTER — Encounter: Payer: Self-pay | Admitting: Neurology

## 2016-06-06 MED ORDER — AMPHETAMINE-DEXTROAMPHETAMINE 10 MG PO TABS: 10.0000 mg | ORAL_TABLET | Freq: Three times a day (TID) | ORAL | 0 refills | Status: DC

## 2016-06-06 NOTE — Telephone Encounter (Signed)
-----   Message from Britt Bottom, MD sent at 06/06/2016 12:13 PM EST ----- Please let her know that the MRI of the cervical spine does show degenerative changes above and below her fusion but that they do not look bad enough to send her to a surgeon. The MRI of the brain shows old MS spots but nothing looks new.

## 2016-06-06 NOTE — Telephone Encounter (Signed)
I have spoken with Helen Sandoval this afternoon and reviewed MRI results with her as below.  She verbalized understanding of same/fim

## 2016-06-06 NOTE — Telephone Encounter (Signed)
Adderall rx. had to be ordered twice in [facility] before it printed.  Rx. and copy of MRI results up front [facility]/fim

## 2016-07-01 ENCOUNTER — Other Ambulatory Visit: Payer: Self-pay | Admitting: Obstetrics & Gynecology

## 2016-07-01 DIAGNOSIS — Z1231 Encounter for screening mammogram for malignant neoplasm of breast: Secondary | ICD-10-CM

## 2016-07-10 ENCOUNTER — Encounter: Payer: Self-pay | Admitting: Neurology

## 2016-07-11 ENCOUNTER — Encounter: Payer: Self-pay | Admitting: Neurology

## 2016-07-11 ENCOUNTER — Telehealth: Payer: Self-pay | Admitting: *Deleted

## 2016-07-11 MED ORDER — AMPHETAMINE-DEXTROAMPHETAMINE 10 MG PO TABS: 10.0000 mg | ORAL_TABLET | Freq: Three times a day (TID) | ORAL | 0 refills | Status: DC

## 2016-07-11 NOTE — Telephone Encounter (Signed)
Adderall rx. up front [facility], per emailed request/fim 

## 2016-07-19 ENCOUNTER — Ambulatory Visit (INDEPENDENT_AMBULATORY_CARE_PROVIDER_SITE_OTHER): Payer: BLUE CROSS/BLUE SHIELD | Admitting: Neurology

## 2016-07-19 ENCOUNTER — Encounter: Payer: Self-pay | Admitting: Neurology

## 2016-07-19 VITALS — BP 137/94 | HR 97 | Resp 18 | Ht 64.0 in | Wt 207.5 lb

## 2016-07-19 DIAGNOSIS — G4730 Sleep apnea, unspecified: Secondary | ICD-10-CM | POA: Diagnosis not present

## 2016-07-19 DIAGNOSIS — F418 Other specified anxiety disorders: Secondary | ICD-10-CM

## 2016-07-19 DIAGNOSIS — N3941 Urge incontinence: Secondary | ICD-10-CM

## 2016-07-19 DIAGNOSIS — R269 Unspecified abnormalities of gait and mobility: Secondary | ICD-10-CM

## 2016-07-19 DIAGNOSIS — F329 Major depressive disorder, single episode, unspecified: Secondary | ICD-10-CM

## 2016-07-19 DIAGNOSIS — R5383 Other fatigue: Secondary | ICD-10-CM

## 2016-07-19 DIAGNOSIS — M5030 Other cervical disc degeneration, unspecified cervical region: Secondary | ICD-10-CM | POA: Diagnosis not present

## 2016-07-19 DIAGNOSIS — G35 Multiple sclerosis: Secondary | ICD-10-CM

## 2016-07-19 DIAGNOSIS — F419 Anxiety disorder, unspecified: Secondary | ICD-10-CM

## 2016-07-19 MED ORDER — HYDROCODONE-ACETAMINOPHEN 5-325MG PREPACK (~~LOC~~: ORAL_TABLET | ORAL | 0 refills | Status: DC

## 2016-07-19 NOTE — Progress Notes (Signed)
[facility]  PATIENT: Helen Sandoval DOB: [birth year]  REFERRING DOCTOR OR PCP:  Otto Herb. ([facility])   Sinclair Ship (new PCP at [facility]) SOURCE: patient and records from [facility]  _________________________________   HISTORICAL  CHIEF COMPLAINT:  Chief Complaint  Patient presents with  . Multiple Sclerosis    Sts. she continues to tolerate Tysabri well.  JCV ab last checked 05-11-17 and was positive at 0.33.  She would like to discuss MRI results/fim    HISTORY OF PRESENT ILLNESS:  Helen Sandoval is a 56 yo woman who was diagnosed with MS in 2015.     She has noted more trouble with vision.   In particular, she has episodes of diplopia.   MRI of the spine and brain 05/2016 comparedt to 2016 MRi is essentially unchanged.    She has had C4-C6 ACDF and has 2-3 small spine spots  MS:   She is on Tysabri therapy and doing well.     Her JCV antibody status was indeterminate (0.33) 05/11/16.. She denies definite exacerbation but notes more cognitive issues and visual blurring.  Gait/strength/sensation:   Gait is stable and she has one recent fall.    Gait is worse with pain.  Her right side feels weaker than her left    She drags her right leg if she is tired and her balance is off leading to gait issues.   Hand numbness resolved  Bladder/bowel:     She was diagnosed with non-sensory incontinence and is doing much better since we increased the Myrbetriq.    No recent bowel incontinence.  Vision:    When she reads, at times, she has transient blurry vision and sometimes diplopia.   This will last 30-60 minutes then improve if she stops reading. No eye pain.    Fatigue/sleep:   Fatigue is better on Tysabri than Tecfidera.   . Fatigue is both physical and cognitive.  Fatigue is worse with heat and the last week of each cycle    Adderall helps fatigue and also slightly helps cognitive issues.    She was having difficulty with both sleep onset and sleep  maintenance and trazodone and BiPAP has helped.           Mood/cognition:  Effexor and Wellbutrin helped mood at first but she was more depressed last year and she changed Effexor to [place] is mild.   She notes difficulty with focus and has had some difficulty with verbal fluency and short term memory.      Adderall only helps a little bit.  OSA:    She has OSA and is on BiPAP   She was told OSA was severe.     She uses BiPAP every night for > 4 hours.        She denies excessive daytime sleepiness but has fatigue.      Compliance report 11/08/15 shows excellent compliance of 97% and efficacy with AHI only 3.2 on BiPAP 11/6.  Neck pain:   She gets neck pain that sometimes triggers headaches.   When that occurs, hydrocodone or Toradol (if during infusion) helps.   MS History:   In 2008, she had headaches and poor balance and was told the MRI had some white matter foci at that time.    She had spinal stenosis and needed surgery in 2014.  The surgeon felt her gait and other issues were decreased out of proportion to the extent of spine  disease and referred her to Neurology.   Besides gait issues, she also had double vision, blurry vision, cognitive difficulty and incontinence in early 2015. She saw Dr. Pamalee Leyden in [place]. An MRI of the brain, CSF and visual evoked potentials were consistent with multiple sclerosis.    Initially, she was placed on Tecfidera. Due to progression with more neurologic symptoms, she was switched to Tysabri.   Her last MRI was done 10/2014 but we do not have the images.     REVIEW OF SYSTEMS: Constitutional: No fevers, chills, sweats, or change in appetite.   She notes fatigue Eyes: Reports visual changes and double vision.  No eye pain Ear, nose and throat: No hearing loss, ear pain, nasal congestion, sore throat Cardiovascular: No chest pain, palpitations Respiratory: No shortness of breath at rest or with exertion.   She has snoring and coughing but no  wheezes GastrointestinaI: No nausea, vomiting, abdominal pain.  She notes diarrhea and some fecal incontinence Genitourinary: No dysuria, urinary retention or frequency. She has had some incontinence Musculoskeletal: No neck pain, back pain Integumentary: No rash, pruritus, skin lesions Neurological: as above Psychiatric: Some depression at this time.  Some anxiety Endocrine: No palpitations, diaphoresis, change in appetite, change in weigh or increased thirst Hematologic/Lymphatic: No anemia, purpura, petechiae. Allergic/Immunologic: No itchy/runny eyes, nasal congestion, recent allergic reactions, rashes  ALLERGIES: Allergies  Allergen Reactions  . Clindamycin Anaphylaxis  . Indomethacin Hives  . Levofloxacin Hives  . Penicillins Anaphylaxis  . Penicillins Cross Reactors Anaphylaxis  . Sulfa Antibiotics Swelling    Blister (steven john syndrome)  . Levaquin [Levofloxacin Hemihydrate] Hives  . Sulfamethoxazole-Trimethoprim   . Zocor [Simvastatin] Other (See Comments)    Other reaction(s): MUSCLE PAIN Muscle aches    HOME MEDICATIONS:  Current Outpatient Prescriptions:  .  albuterol (VENTOLIN HFA) 108 (90 BASE) MCG/ACT inhaler, Inhale into the lungs., Disp: , Rfl:  .  ALPRAZolam (XANAX) 0.5 MG tablet, Take 0.5 tablets (0.25 mg total) by mouth 2 (two) times daily as needed for anxiety., Disp: 60 tablet, Rfl: 1 .  amphetamine-dextroamphetamine (ADDERALL) 10 MG tablet, Take 1 tablet (10 mg total) by mouth 3 (three) times daily., Disp: 90 tablet, Rfl: 0 .  aspirin EC 81 MG tablet, Take 81 mg by mouth every other day.  , Disp: , Rfl:  .  buPROPion (WELLBUTRIN SR) 150 MG 12 hr tablet, Take 150 mg by mouth 2 (two) times daily. , Disp: , Rfl:  .  Calcium Carbonate-Vit D-Min (CALCIUM 1200 PO), Take 1 tablet by mouth daily.  , Disp: , Rfl:  .  cholecalciferol (VITAMIN D) 1000 UNITS tablet, Take 5,000 Units by mouth daily.  , Disp: , Rfl:  .  cyclobenzaprine (FLEXERIL) 5 MG tablet, TAKE  ONE TABLET BY MOUTH THREE TIMES DAILY AS NEEDED, Disp: 90 tablet, Rfl: 5 .  DULERA 200-5 MCG/ACT AERO, , Disp: , Rfl:  .  estradiol (CLIMARA - DOSED IN MG/24 HR) 0.05 mg/24hr patch, , Disp: , Rfl:  .  ezetimibe (ZETIA) 10 MG tablet, Take 10 mg by mouth., Disp: , Rfl:  .  FLUARIX QUADRIVALENT 0.5 ML injection, , Disp: , Rfl:  .  gabapentin (NEURONTIN) 300 MG capsule, TAKE ONE CAPSULE BY MOUTH THREE TIMES DAILY, Disp: 90 capsule, Rfl: 5 .  HYDROcodone-acetaminophen (VICODIN) 5-325 mg TABS tablet, 1 tablet as needed every 8 hours, Disp: 60 tablet, Rfl: 0 .  Krill Oil 1000 MG CAPS, Take 1,000 mg by mouth., Disp: , Rfl:  .  MYRBETRIQ  50 MG TB24 tablet, TAKE ONE TABLET BY MOUTH ONCE DAILY, Disp: 30 tablet, Rfl: 11 .  natalizumab (TYSABRI) 300 MG/15ML injection, Inject 300 mg into the vein., Disp: , Rfl:  .  omeprazole (PRILOSEC) 20 MG capsule, Take 20 mg by mouth., Disp: , Rfl:  .  traMADol (ULTRAM) 50 MG tablet, TAKE ONE TABLET BY MOUTH EVERY 12 HOURS AS NEEDED, Disp: 60 tablet, Rfl: 3 .  traZODone (DESYREL) 100 MG tablet, TAKE ONE TABLET BY MOUTH AT BEDTIME, Disp: 30 tablet, Rfl: 11 .  TURMERIC PO, Take by mouth., Disp: , Rfl:  .  Vilazodone HCl (VIIBRYD) 20 MG TABS, Take 20 mg by mouth daily., Disp: , Rfl:  .  vitamin C (ASCORBIC ACID) 500 MG tablet, Take 500 mg by mouth daily.  , Disp: , Rfl:   PAST MEDICAL HISTORY: Past Medical History:  Diagnosis Date  . Ankle fracture   . Osteopenia   . Scarlet fever   . Shingles   . Sleep apnea   . Spina bifida   . Vitamin D deficiency     PAST SURGICAL HISTORY: Past Surgical History:  Procedure Laterality Date  . ABDOMINAL HYSTERECTOMY      FAMILY HISTORY: No family history on file.  SOCIAL HISTORY:  Social History   Social History  . Marital status: Divorced    Spouse name: N/A  . Number of children: N/A  . Years of education: N/A   Occupational History  . Not on file.   Social History Main Topics  . Smoking status: Current  Some Day Smoker  . Smokeless tobacco: Never Used  . Alcohol use No  . Drug use: No  . Sexual activity: Not on file   Other Topics Concern  . Not on file   Social History Narrative  . No narrative on file     PHYSICAL EXAM  Vitals:   07/19/16 1317  BP: (!) 137/94  Pulse: 97  Resp: 18  Weight: 207 lb 8 oz (94.1 kg)  Height: 5\' 4"  (1.626 m)    Body mass index is 35.62 kg/m.   General: The patient is well-developed and well-nourished and in no acute distress  Neck: The neck is supple with good ROM and nontender.      Neurologic Exam  Mental status: The patient is alert and oriented x 3 at the time of the examination. The patient has apparent normal recent and remote memory, with an apparently normal attention span and concentration ability.   Speech is normal.  Cranial nerves: Extraocular movements are full.   There is reduced right facial sensation to temperature.Facial strength is normal.  Trapezius and sternocleidomastoid strength is normal. No dysarthria is noted.  No obvious hearing deficits are noted.  Motor:  Muscle bulk is normal.   Tone is normal. Strength is  5 / 5 in all 4 extremities.   Sensory: Sensory testing is intact to temperature, soft touch and vibration sensation   Coordination: Cerebellar testing reveals good finger-nose-finger and reduced heel-to-shin bilaterally.  Gait and station: Station is normal.   Gait is mildly wide. Tandem gait is wide. Romberg is negative.   Reflexes: Deep tendon reflexes are symmetric and 2+ arms and 3+ knees with spread.   No ankle clonus.    DIAGNOSTIC DATA (LABS, IMAGING, TESTING) - I reviewed patient records, labs, notes, testing and imaging myself where available.       ASSESSMENT AND PLAN   Multiple sclerosis (HCC)  Anxiety and depression  Sleep apnea,  unspecified type  Urge incontinence  Gait disturbance  Other fatigue  Degeneration of intervertebral disc of cervical region   1.   Visual  changes appear to be fluctuations not exacerbations.   Continue Tysabri for MS.   Check JCV Ab every 4-6 months. 2.   Continue Adderall for fatigue, cognition.    3.   Continue Myrbetriq 50 mg for bladder 4.   Continue BiPAP 5.   Return in 6 months or sooner if there are new or worsening neurologic symptoms.   Clytee Heinrich A. Felecia Shelling, MD, PhD [id number], [id number] PM Certified in Neurology, Clinical Neurophysiology, Sleep Medicine, Pain Medicine and Neuroimaging . [facility] [address], [place], [place] [address] [phone]

## 2016-07-26 ENCOUNTER — Other Ambulatory Visit: Payer: Self-pay | Admitting: Neurology

## 2016-08-04 ENCOUNTER — Ambulatory Visit
Admission: RE | Admit: 2016-08-04 | Discharge: 2016-08-04 | Disposition: A | Discharge status: Home / Self Care | Payer: BLUE CROSS/BLUE SHIELD | Source: Ambulatory Visit | Attending: Obstetrics & Gynecology | Admitting: Obstetrics & Gynecology

## 2016-08-04 DIAGNOSIS — Z1231 Encounter for screening mammogram for malignant neoplasm of breast: Secondary | ICD-10-CM

## 2016-08-08 ENCOUNTER — Telehealth: Payer: Self-pay | Admitting: *Deleted

## 2016-08-08 MED ORDER — AMPHETAMINE-DEXTROAMPHETAMINE 10 MG PO TABS: 10.0000 mg | ORAL_TABLET | Freq: Three times a day (TID) | ORAL | 0 refills | Status: DC

## 2016-08-08 NOTE — Telephone Encounter (Signed)
Rx. up front [facility]/fim 

## 2016-08-08 NOTE — Telephone Encounter (Signed)
Rx. awaiting RAS sig/fim 

## 2016-08-28 ENCOUNTER — Other Ambulatory Visit: Payer: Self-pay | Admitting: Neurology

## 2016-08-31 ENCOUNTER — Encounter: Payer: Self-pay | Admitting: *Deleted

## 2016-09-02 ENCOUNTER — Telehealth: Payer: Self-pay | Admitting: *Deleted

## 2016-09-02 ENCOUNTER — Encounter: Payer: Self-pay | Admitting: Neurology

## 2016-09-02 MED ORDER — AMPHETAMINE-DEXTROAMPHETAMINE 10 MG PO TABS: 10.0000 mg | ORAL_TABLET | Freq: Three times a day (TID) | ORAL | 0 refills | Status: DC

## 2016-09-02 NOTE — Telephone Encounter (Signed)
Adderall rx. up front [facility] per emailed request/fim 

## 2016-09-13 ENCOUNTER — Encounter: Payer: Self-pay | Admitting: Neurology

## 2016-10-11 ENCOUNTER — Encounter: Payer: Self-pay | Admitting: Neurology

## 2016-10-11 ENCOUNTER — Telehealth: Payer: Self-pay | Admitting: *Deleted

## 2016-10-11 MED ORDER — AMPHETAMINE-DEXTROAMPHETAMINE 10 MG PO TABS: 10.0000 mg | ORAL_TABLET | Freq: Three times a day (TID) | ORAL | 0 refills | Status: DC

## 2016-10-11 NOTE — Telephone Encounter (Signed)
Adderall rx. up front [facility] per pt's emailed request/fim

## 2016-11-07 ENCOUNTER — Encounter: Payer: Self-pay | Admitting: Neurology

## 2016-11-08 ENCOUNTER — Telehealth: Payer: Self-pay | Admitting: *Deleted

## 2016-11-08 MED ORDER — AMPHETAMINE-DEXTROAMPHETAMINE 10 MG PO TABS: 10.0000 mg | ORAL_TABLET | Freq: Three times a day (TID) | ORAL | 0 refills | Status: DC

## 2016-11-08 NOTE — Telephone Encounter (Signed)
Rx. up front [facility], per emailed request/fim

## 2016-11-09 ENCOUNTER — Ambulatory Visit (INDEPENDENT_AMBULATORY_CARE_PROVIDER_SITE_OTHER): Payer: BLUE CROSS/BLUE SHIELD | Admitting: Neurology

## 2016-11-09 ENCOUNTER — Encounter: Payer: Self-pay | Admitting: Neurology

## 2016-11-09 VITALS — BP 120/84 | HR 99 | Ht 64.0 in | Wt 202.5 lb

## 2016-11-09 DIAGNOSIS — G4733 Obstructive sleep apnea (adult) (pediatric): Secondary | ICD-10-CM

## 2016-11-09 DIAGNOSIS — R269 Unspecified abnormalities of gait and mobility: Secondary | ICD-10-CM

## 2016-11-09 DIAGNOSIS — R5383 Other fatigue: Secondary | ICD-10-CM

## 2016-11-09 DIAGNOSIS — G35 Multiple sclerosis: Secondary | ICD-10-CM | POA: Diagnosis not present

## 2016-11-09 NOTE — Progress Notes (Signed)
[facility]  PATIENT: Helen Sandoval DOB: September 19, 1960  REFERRING DOCTOR OR PCP:  Otto Herb. ([facility])   Sinclair Ship (new PCP at [facility]) SOURCE: patient and records from [facility]  _________________________________   HISTORICAL  CHIEF COMPLAINT:  Chief Complaint  Patient presents with  . Multiple Sclerosis    Sts. she continues to tolerate Tysabri well.  JCV ab last checked 05/11/16 and was indeterminate at 0.33.  Sts. memory and cognitive difficulty continue to be her most bothersome sx/fim    HISTORY OF PRESENT ILLNESS:  Helen Sandoval is a 56 yo woman who was diagnosed with MS in 2015.       MS:   She has noted more trouble with her vision but otherwise feels about the same as the last visit. Marland Kitchen   MRI of the spine and brain 05/2016 comparedt to 2016 MRI is essentially unchanged.   The MRI of the cervical spine shows C4-C6 ACDF and has 2-3 small spine spots.   She is on Tysabri. She tolerates it well and has not had any exacerbations on it. Her JCV antibody status was indeterminate at last check (0.33 05/11/2016)   Gait/strength/sensation:   Her gait is stable but she feels off balance at times. She has had a few falls hurting her knee with a recent one. Her right leg feels mildly weaker than the left leg and she drags it when she is tired.   She denies any paresthesias.  Bladder/bowel:     Her urinary urgency and incontinence is doing better with Myrbetriq   No recent bowel incontinence.  Vision:   She feels vision is worse and she notes more diplopia and blurry vision.  This occurs randomly, not just if hot or tired.     . No eye pain.    Fatigue/sleep:   She feels that her fatigue is doing fairly well. Is mild most days but worse with heat and if she has a more active day. The fatigue is both physical and cognitive. She is sleeping fairly well most nights.    Adderall helps fatigue and also slightly helps cognitive issues.    She has OSA  and is on BiPAP (Since 2012)   She was told OSA was severe.     She uses BiPAP every night for > 4 hours.        She denies excessive daytime sleepiness but has fatigue.   Her mom noted that shsnored some through her mask.   No recent download but compliance download  11/08/15 shows excellent compliance of 97% and efficacy with AHI only 3.2 on BiPAP 11/6.  Mood/cognition:  Effexor and Wellbutrin helped mood at first but she was more depressed last year and she changed Effexor to [place] is mild.   She notes difficulty with focus and has had some difficulty with verbal fluency and short term memory.      Adderall only helps a little bit.  MS History:   In 2008, she had headaches and poor balance and was told the MRI had some white matter foci at that time.    She had spinal stenosis and needed surgery in 2014.  The surgeon felt her gait and other issues were decreased out of proportion to the extent of spine disease and referred her to Neurology.   Besides gait issues, she also had double vision, blurry vision, cognitive difficulty and incontinence in early 2015. She saw Dr. Pamalee Leyden in [place]. An  MRI of the brain, CSF and visual evoked potentials were consistent with multiple sclerosis.    Initially, she was placed on Tecfidera. Due to progression with more neurologic symptoms, she was switched to Tysabri.   Her last MRI was done 10/2014 but we do not have the images.     REVIEW OF SYSTEMS: Constitutional: No fevers, chills, sweats, or change in appetite.   She notes fatigue Eyes: Reports visual changes and double vision.  No eye pain Ear, nose and throat: No hearing loss, ear pain, nasal congestion, sore throat Cardiovascular: No chest pain, palpitations Respiratory: No shortness of breath at rest or with exertion.   She has snoring and coughing but no wheezes GastrointestinaI: No nausea, vomiting, abdominal pain.  She notes diarrhea and some fecal incontinence Genitourinary: No dysuria,  urinary retention or frequency. She has had some incontinence Musculoskeletal: No neck pain, back pain Integumentary: No rash, pruritus, skin lesions Neurological: as above Psychiatric: Some depression at this time.  Some anxiety Endocrine: No palpitations, diaphoresis, change in appetite, change in weigh or increased thirst Hematologic/Lymphatic: No anemia, purpura, petechiae. Allergic/Immunologic: No itchy/runny eyes, nasal congestion, recent allergic reactions, rashes  ALLERGIES: Allergies  Allergen Reactions  . Clindamycin Anaphylaxis  . Indomethacin Hives  . Levofloxacin Hives  . Penicillins Anaphylaxis  . Penicillins Cross Reactors Anaphylaxis  . Sulfa Antibiotics Swelling    Blister (steven john syndrome)  . Levaquin [Levofloxacin Hemihydrate] Hives  . Sulfamethoxazole-Trimethoprim   . Zocor [Simvastatin] Other (See Comments)    Other reaction(s): MUSCLE PAIN Muscle aches    HOME MEDICATIONS:  Current Outpatient Prescriptions:  .  albuterol (VENTOLIN HFA) 108 (90 BASE) MCG/ACT inhaler, Inhale into the lungs., Disp: , Rfl:  .  ALPRAZolam (XANAX) 0.5 MG tablet, Take 0.5 tablets (0.25 mg total) by mouth 2 (two) times daily as needed for anxiety., Disp: 60 tablet, Rfl: 1 .  amphetamine-dextroamphetamine (ADDERALL) 10 MG tablet, Take 1 tablet (10 mg total) by mouth 3 (three) times daily., Disp: 90 tablet, Rfl: 0 .  aspirin EC 81 MG tablet, Take 81 mg by mouth every other day.  , Disp: , Rfl:  .  buPROPion (WELLBUTRIN SR) 150 MG 12 hr tablet, Take 150 mg by mouth 2 (two) times daily. , Disp: , Rfl:  .  Calcium Carbonate-Vit D-Min (CALCIUM 1200 PO), Take 1 tablet by mouth daily.  , Disp: , Rfl:  .  cholecalciferol (VITAMIN D) 1000 UNITS tablet, Take 5,000 Units by mouth daily.  , Disp: , Rfl:  .  cyclobenzaprine (FLEXERIL) 5 MG tablet, TAKE ONE TABLET BY MOUTH THREE TIMES DAILY AS NEEDED, Disp: 90 tablet, Rfl: 5 .  DULERA 200-5 MCG/ACT AERO, , Disp: , Rfl:  .  estradiol  (CLIMARA - DOSED IN MG/24 HR) 0.05 mg/24hr patch, , Disp: , Rfl:  .  ezetimibe (ZETIA) 10 MG tablet, Take 10 mg by mouth., Disp: , Rfl:  .  FLUARIX QUADRIVALENT 0.5 ML injection, , Disp: , Rfl:  .  gabapentin (NEURONTIN) 300 MG capsule, TAKE ONE CAPSULE BY MOUTH THREE TIMES DAILY, Disp: 90 capsule, Rfl: 5 .  HYDROcodone-acetaminophen (VICODIN) 5-325 mg TABS tablet, 1 tablet as needed every 8 hours, Disp: 60 tablet, Rfl: 0 .  Krill Oil 1000 MG CAPS, Take 1,000 mg by mouth., Disp: , Rfl:  .  MYRBETRIQ 50 MG TB24 tablet, TAKE ONE TABLET BY MOUTH ONCE DAILY, Disp: 30 tablet, Rfl: 11 .  natalizumab (TYSABRI) 300 MG/15ML injection, Inject 300 mg into the vein., Disp: , Rfl:  .  omeprazole (PRILOSEC) 20 MG capsule, Take 20 mg by mouth., Disp: , Rfl:  .  traMADol (ULTRAM) 50 MG tablet, TAKE ONE TABLET BY MOUTH EVERY 12 HOURS AS NEEDED, Disp: 60 tablet, Rfl: 3 .  traZODone (DESYREL) 100 MG tablet, TAKE ONE TABLET BY MOUTH AT BEDTIME, Disp: 30 tablet, Rfl: 11 .  TURMERIC PO, Take by mouth., Disp: , Rfl:  .  Vilazodone HCl (VIIBRYD) 20 MG TABS, Take 20 mg by mouth daily., Disp: , Rfl:  .  vitamin C (ASCORBIC ACID) 500 MG tablet, Take 500 mg by mouth daily.  , Disp: , Rfl:   PAST MEDICAL HISTORY: Past Medical History:  Diagnosis Date  . Ankle fracture   . Osteopenia   . Scarlet fever   . Shingles   . Sleep apnea   . Spina bifida   . Vitamin D deficiency     PAST SURGICAL HISTORY: Past Surgical History:  Procedure Laterality Date  . ABDOMINAL HYSTERECTOMY      FAMILY HISTORY: No family history on file.  SOCIAL HISTORY:  Social History   Social History  . Marital status: Divorced    Spouse name: N/A  . Number of children: N/A  . Years of education: N/A   Occupational History  . Not on file.   Social History Main Topics  . Smoking status: Current Some Day Smoker  . Smokeless tobacco: Never Used  . Alcohol use No  . Drug use: No  . Sexual activity: Not on file   Other Topics  Concern  . Not on file   Social History Narrative  . No narrative on file     PHYSICAL EXAM  Vitals:   11/09/16 1304  BP: 120/84  Pulse: 99  Weight: 202 lb 8 oz (91.9 kg)  Height: 5\' 4"  (1.626 m)    Body mass index is 34.76 kg/m.   General: The patient is well-developed and well-nourished and in no acute distress  Neck: The neck is supple with good ROM and nontender.      Neurologic Exam  Mental status: The patient is alert and oriented x 3 at the time of the examination. The patient has apparent normal recent and remote memory, with an apparently normal attention span and concentration ability.   Speech is normal.  Cranial nerves: Extraocular movements are full.  She reports mild reduced sensation in the right face. Facial strength is normal and symmetric. Trapezius and sternocleidomastoid strength is normal.. No dysarthria is noted.  No obvious hearing deficits are noted.  Motor:  Muscle bulk is normal.   Tone is normal. Strength is  5 / 5 in all 4 extremities.   Sensory: She has normal sensation to touch and temperature and vibration in the arms and legs.  Coordination: Cerebellar testing reveals good finger-nose-finger and reduced heel-to-shin bilaterally.  Gait and station: Station is normal.   Gait is mildly wide. Her tandem gait is wide. Romberg is negative..   Reflexes: Deep tendon reflexes are symmetric and 2+ arms and 3+ knees with spread.   No ankle clonus.    DIAGNOSTIC DATA (LABS, IMAGING, TESTING) - I reviewed patient records, labs, notes, testing and imaging myself where available.       ASSESSMENT AND PLAN   Multiple sclerosis ([place]) - Plan: Stratify JCV Antibody Test ([facility]), CBC with Differential/Platelet  DS (disseminated sclerosis) (HCC)  Gait disturbance  Other fatigue  Obstructive sleep apnea syndrome   1.   She will continue Tysabri 300 mg every 4 weeks.  I will check his JCV antibody today.   Also check CBC. 2.   She will  continue her medications for fatigue and bladder..   3.   Continue BiPAP. I asked her to bring her CPAP chip with her and we will try to read it to get a download. 4.   Return in 6 months or sooner if there are new or worsening neurologic symptoms.   Helen Sandoval A. Felecia Shelling, MD, PhD 1/69/6789, 3:81 PM Certified in Neurology, Clinical Neurophysiology, Sleep Medicine, Pain Medicine and Neuroimaging . [facility] [address] [place], [place] [address] [phone]

## 2016-11-10 LAB — CBC WITH DIFFERENTIAL/PLATELET
BASOS ABS: 0 10*3/uL (ref 0.0–0.2)
Basos: 0 %
EOS (ABSOLUTE): 0.1 10*3/uL (ref 0.0–0.4)
Eos: 2 %
HEMATOCRIT: 41.5 % (ref 34.0–46.6)
HEMOGLOBIN: 13.7 g/dL (ref 11.1–15.9)
Immature Grans (Abs): 0 10*3/uL (ref 0.0–0.1)
Immature Granulocytes: 0 %
LYMPHS ABS: 1.9 10*3/uL (ref 0.7–3.1)
Lymphs: 27 %
MCH: 28.9 pg (ref 26.6–33.0)
MCHC: 33 g/dL (ref 31.5–35.7)
MCV: 88 fL (ref 79–97)
MONOCYTES: 5 %
MONOS ABS: 0.4 10*3/uL (ref 0.1–0.9)
NEUTROS ABS: 4.4 10*3/uL (ref 1.4–7.0)
Neutrophils: 66 %
Platelets: 175 10*3/uL (ref 150–379)
RBC: 4.74 x10E6/uL (ref 3.77–5.28)
RDW: 14.9 % (ref 12.3–15.4)
WBC: 6.8 10*3/uL (ref 3.4–10.8)

## 2016-11-15 ENCOUNTER — Telehealth: Payer: Self-pay | Admitting: *Deleted

## 2016-11-15 NOTE — Telephone Encounter (Signed)
[place].  I am working on her disability paperwork and have a question before I can complete it/fim

## 2016-11-15 NOTE — Telephone Encounter (Signed)
Disability paperwork for [employer] completed and sent up front to [employer] [facility] so form fee can be collected, pt. can be called to pick up completed paperwork/fim

## 2016-11-24 ENCOUNTER — Encounter: Payer: Self-pay | Admitting: Neurology

## 2016-11-25 ENCOUNTER — Telehealth: Payer: Self-pay | Admitting: *Deleted

## 2016-11-25 NOTE — Telephone Encounter (Signed)
LVM unable to reach pt, pt form @ the front desk.

## 2016-11-28 DIAGNOSIS — Z0289 Encounter for other administrative examinations: Secondary | ICD-10-CM

## 2016-12-05 ENCOUNTER — Encounter: Payer: Self-pay | Admitting: Neurology

## 2016-12-06 ENCOUNTER — Telehealth: Payer: Self-pay | Admitting: *Deleted

## 2016-12-06 MED ORDER — AMPHETAMINE-DEXTROAMPHETAMINE 10 MG PO TABS: 10.0000 mg | ORAL_TABLET | Freq: Three times a day (TID) | ORAL | 0 refills | Status: DC

## 2016-12-06 NOTE — Telephone Encounter (Signed)
Rx. awaiting RAS sig/fim 

## 2016-12-26 ENCOUNTER — Encounter: Payer: Self-pay | Admitting: Neurology

## 2016-12-27 ENCOUNTER — Telehealth: Payer: Self-pay | Admitting: Neurology

## 2016-12-27 NOTE — Telephone Encounter (Signed)
Patient sent email yesterday regarding Bipap machine not working. Please call and discuss.

## 2016-12-27 NOTE — Telephone Encounter (Signed)
I placed a signed order on your desk  Thnk you

## 2016-12-27 NOTE — Telephone Encounter (Addendum)
Spoke with patient who stated her bipap machine quit working. Her insurance stated she will have to have new prescription faxed to a [place], [place] or [employer] supply. Those are the companies her insurance uses.  Patient stated her mother uses [facility], so that if her first choice, unless Dr Felecia Shelling recommends a different company. Advised patient will send her request to Dr Felecia Shelling, and she'll get a call back advising her of when order has been faxed. She verbalized understanding, appreciation.

## 2016-12-28 NOTE — Telephone Encounter (Addendum)
Order for new Bipap machine successfully faxed to [facility]. Received confirmation from [place]  of order and that it is under review.

## 2017-01-02 ENCOUNTER — Encounter: Payer: Self-pay | Admitting: Neurology

## 2017-01-03 ENCOUNTER — Telehealth: Payer: Self-pay | Admitting: *Deleted

## 2017-01-03 DIAGNOSIS — G35 Multiple sclerosis: Secondary | ICD-10-CM | POA: Diagnosis not present

## 2017-01-03 DIAGNOSIS — G4733 Obstructive sleep apnea (adult) (pediatric): Secondary | ICD-10-CM

## 2017-01-03 MED ORDER — AMPHETAMINE-DEXTROAMPHETAMINE 10 MG PO TABS: 10.0000 mg | ORAL_TABLET | Freq: Three times a day (TID) | ORAL | 0 refills | Status: DC

## 2017-01-03 NOTE — Telephone Encounter (Signed)
Rx. up front [facility]/fim 

## 2017-01-04 NOTE — Telephone Encounter (Signed)
DME order for Bipap 11/6, sleep study, demographic sheet faxed to [facility], fax# [phone]/TLX

## 2017-01-04 NOTE — Addendum Note (Signed)
Addended by: France Ravens I on: 01/04/2017 08:34 AM   Modules accepted: Orders

## 2017-01-06 DIAGNOSIS — R5383 Other fatigue: Secondary | ICD-10-CM | POA: Diagnosis not present

## 2017-01-06 DIAGNOSIS — G4730 Sleep apnea, unspecified: Secondary | ICD-10-CM | POA: Diagnosis not present

## 2017-01-06 DIAGNOSIS — N3941 Urge incontinence: Secondary | ICD-10-CM | POA: Diagnosis not present

## 2017-01-06 DIAGNOSIS — R269 Unspecified abnormalities of gait and mobility: Secondary | ICD-10-CM | POA: Diagnosis not present

## 2017-01-16 DIAGNOSIS — G4730 Sleep apnea, unspecified: Secondary | ICD-10-CM | POA: Diagnosis not present

## 2017-01-25 DIAGNOSIS — E782 Mixed hyperlipidemia: Secondary | ICD-10-CM | POA: Diagnosis not present

## 2017-01-25 DIAGNOSIS — E559 Vitamin D deficiency, unspecified: Secondary | ICD-10-CM | POA: Diagnosis not present

## 2017-01-25 DIAGNOSIS — Z0000 Encounter for general adult medical examination without abnormal findings: Secondary | ICD-10-CM | POA: Diagnosis not present

## 2017-01-25 DIAGNOSIS — Z131 Encounter for screening for diabetes mellitus: Secondary | ICD-10-CM | POA: Diagnosis not present

## 2017-01-30 ENCOUNTER — Telehealth: Payer: Self-pay | Admitting: *Deleted

## 2017-01-30 ENCOUNTER — Encounter: Payer: Self-pay | Admitting: Neurology

## 2017-01-30 MED ORDER — AMPHETAMINE-DEXTROAMPHETAMINE 10 MG PO TABS: 10.0000 mg | ORAL_TABLET | Freq: Three times a day (TID) | ORAL | 0 refills | Status: DC

## 2017-01-30 NOTE — Telephone Encounter (Signed)
Rx. awaiting RAS sig/fim 

## 2017-01-31 DIAGNOSIS — G35 Multiple sclerosis: Secondary | ICD-10-CM | POA: Diagnosis not present

## 2017-02-01 DIAGNOSIS — G4733 Obstructive sleep apnea (adult) (pediatric): Secondary | ICD-10-CM | POA: Diagnosis not present

## 2017-02-01 DIAGNOSIS — Z23 Encounter for immunization: Secondary | ICD-10-CM | POA: Diagnosis not present

## 2017-02-01 DIAGNOSIS — M5030 Other cervical disc degeneration, unspecified cervical region: Secondary | ICD-10-CM | POA: Diagnosis not present

## 2017-02-01 DIAGNOSIS — R002 Palpitations: Secondary | ICD-10-CM | POA: Diagnosis not present

## 2017-02-01 DIAGNOSIS — E782 Mixed hyperlipidemia: Secondary | ICD-10-CM | POA: Diagnosis not present

## 2017-02-01 DIAGNOSIS — G35 Multiple sclerosis: Secondary | ICD-10-CM | POA: Diagnosis not present

## 2017-02-06 DIAGNOSIS — R269 Unspecified abnormalities of gait and mobility: Secondary | ICD-10-CM | POA: Diagnosis not present

## 2017-02-06 DIAGNOSIS — R5383 Other fatigue: Secondary | ICD-10-CM | POA: Diagnosis not present

## 2017-02-06 DIAGNOSIS — G4730 Sleep apnea, unspecified: Secondary | ICD-10-CM | POA: Diagnosis not present

## 2017-02-06 DIAGNOSIS — N3941 Urge incontinence: Secondary | ICD-10-CM | POA: Diagnosis not present

## 2017-02-27 ENCOUNTER — Encounter: Payer: Self-pay | Admitting: Neurology

## 2017-02-28 ENCOUNTER — Telehealth: Payer: Self-pay | Admitting: *Deleted

## 2017-02-28 DIAGNOSIS — G35 Multiple sclerosis: Secondary | ICD-10-CM | POA: Diagnosis not present

## 2017-02-28 MED ORDER — AMPHETAMINE-DEXTROAMPHETAMINE 10 MG PO TABS: 10.0000 mg | ORAL_TABLET | Freq: Three times a day (TID) | ORAL | 0 refills | Status: DC

## 2017-02-28 NOTE — Telephone Encounter (Signed)
Adderall rx. up front [facility]/fim 

## 2017-03-08 DIAGNOSIS — N3941 Urge incontinence: Secondary | ICD-10-CM | POA: Diagnosis not present

## 2017-03-08 DIAGNOSIS — R269 Unspecified abnormalities of gait and mobility: Secondary | ICD-10-CM | POA: Diagnosis not present

## 2017-03-08 DIAGNOSIS — R5383 Other fatigue: Secondary | ICD-10-CM | POA: Diagnosis not present

## 2017-03-08 DIAGNOSIS — G4730 Sleep apnea, unspecified: Secondary | ICD-10-CM | POA: Diagnosis not present

## 2017-03-17 ENCOUNTER — Telehealth: Payer: Self-pay | Admitting: *Deleted

## 2017-03-17 ENCOUNTER — Other Ambulatory Visit: Payer: Self-pay | Admitting: *Deleted

## 2017-03-17 DIAGNOSIS — G35 Multiple sclerosis: Secondary | ICD-10-CM

## 2017-03-17 DIAGNOSIS — Z79899 Other long term (current) drug therapy: Secondary | ICD-10-CM

## 2017-03-17 MED ORDER — AMPHETAMINE-DEXTROAMPHETAMINE 10 MG PO TABS: 10.0000 mg | ORAL_TABLET | Freq: Three times a day (TID) | ORAL | 0 refills | Status: DC

## 2017-03-17 NOTE — Telephone Encounter (Signed)
Adderall rx. up front [facility], for pt. to pick up at 11/6 infusion appt/fim

## 2017-03-20 ENCOUNTER — Encounter: Payer: Self-pay | Admitting: *Deleted

## 2017-03-28 DIAGNOSIS — G35 Multiple sclerosis: Secondary | ICD-10-CM | POA: Diagnosis not present

## 2017-04-03 ENCOUNTER — Encounter: Payer: Self-pay | Admitting: Neurology

## 2017-04-08 DIAGNOSIS — R5383 Other fatigue: Secondary | ICD-10-CM | POA: Diagnosis not present

## 2017-04-08 DIAGNOSIS — G4730 Sleep apnea, unspecified: Secondary | ICD-10-CM | POA: Diagnosis not present

## 2017-04-08 DIAGNOSIS — R269 Unspecified abnormalities of gait and mobility: Secondary | ICD-10-CM | POA: Diagnosis not present

## 2017-04-08 DIAGNOSIS — N3941 Urge incontinence: Secondary | ICD-10-CM | POA: Diagnosis not present

## 2017-04-18 DIAGNOSIS — G4730 Sleep apnea, unspecified: Secondary | ICD-10-CM | POA: Diagnosis not present

## 2017-04-25 ENCOUNTER — Other Ambulatory Visit (INDEPENDENT_AMBULATORY_CARE_PROVIDER_SITE_OTHER): Payer: Self-pay

## 2017-04-25 ENCOUNTER — Encounter: Payer: Self-pay | Admitting: Neurology

## 2017-04-25 ENCOUNTER — Other Ambulatory Visit: Payer: Self-pay | Admitting: Neurology

## 2017-04-25 DIAGNOSIS — Z0289 Encounter for other administrative examinations: Secondary | ICD-10-CM

## 2017-04-25 DIAGNOSIS — G35 Multiple sclerosis: Secondary | ICD-10-CM

## 2017-04-25 MED ORDER — AMPHETAMINE-DEXTROAMPHETAMINE 10 MG PO TABS: 10.0000 mg | ORAL_TABLET | Freq: Three times a day (TID) | ORAL | 0 refills | Status: DC

## 2017-04-26 LAB — CBC WITH DIFFERENTIAL/PLATELET
Basophils Absolute: 0 10*3/uL (ref 0.0–0.2)
Basos: 0 %
EOS (ABSOLUTE): 0.1 10*3/uL (ref 0.0–0.4)
EOS: 2 %
HEMATOCRIT: 41.3 % (ref 34.0–46.6)
HEMOGLOBIN: 13.2 g/dL (ref 11.1–15.9)
Immature Grans (Abs): 0 10*3/uL (ref 0.0–0.1)
Immature Granulocytes: 1 %
LYMPHS ABS: 1.8 10*3/uL (ref 0.7–3.1)
Lymphs: 30 %
MCH: 29.8 pg (ref 26.6–33.0)
MCHC: 32 g/dL (ref 31.5–35.7)
MCV: 93 fL (ref 79–97)
MONOCYTES: 6 %
Monocytes Absolute: 0.4 10*3/uL (ref 0.1–0.9)
NEUTROS ABS: 3.7 10*3/uL (ref 1.4–7.0)
Neutrophils: 61 %
Platelets: 164 10*3/uL (ref 150–379)
RBC: 4.43 x10E6/uL (ref 3.77–5.28)
RDW: 15 % (ref 12.3–15.4)
WBC: 6 10*3/uL (ref 3.4–10.8)

## 2017-04-28 DIAGNOSIS — G4733 Obstructive sleep apnea (adult) (pediatric): Secondary | ICD-10-CM | POA: Diagnosis not present

## 2017-05-04 ENCOUNTER — Encounter: Payer: Self-pay | Admitting: *Deleted

## 2017-05-06 ENCOUNTER — Other Ambulatory Visit: Payer: Self-pay | Admitting: Neurology

## 2017-05-08 DIAGNOSIS — N3941 Urge incontinence: Secondary | ICD-10-CM | POA: Diagnosis not present

## 2017-05-08 DIAGNOSIS — R269 Unspecified abnormalities of gait and mobility: Secondary | ICD-10-CM | POA: Diagnosis not present

## 2017-05-08 DIAGNOSIS — G4730 Sleep apnea, unspecified: Secondary | ICD-10-CM | POA: Diagnosis not present

## 2017-05-08 DIAGNOSIS — R5383 Other fatigue: Secondary | ICD-10-CM | POA: Diagnosis not present

## 2017-05-10 ENCOUNTER — Encounter: Payer: Self-pay | Admitting: Neurology

## 2017-05-11 ENCOUNTER — Ambulatory Visit: Payer: Medicare HMO | Admitting: Neurology

## 2017-05-11 ENCOUNTER — Encounter: Payer: Self-pay | Admitting: Neurology

## 2017-05-11 ENCOUNTER — Other Ambulatory Visit: Payer: Self-pay

## 2017-05-11 VITALS — BP 136/90 | HR 86 | Resp 16 | Ht 64.0 in | Wt 197.0 lb

## 2017-05-11 DIAGNOSIS — G4733 Obstructive sleep apnea (adult) (pediatric): Secondary | ICD-10-CM | POA: Diagnosis not present

## 2017-05-11 DIAGNOSIS — M79605 Pain in left leg: Secondary | ICD-10-CM

## 2017-05-11 DIAGNOSIS — R269 Unspecified abnormalities of gait and mobility: Secondary | ICD-10-CM | POA: Diagnosis not present

## 2017-05-11 DIAGNOSIS — F329 Major depressive disorder, single episode, unspecified: Secondary | ICD-10-CM

## 2017-05-11 DIAGNOSIS — M79604 Pain in right leg: Secondary | ICD-10-CM | POA: Diagnosis not present

## 2017-05-11 DIAGNOSIS — R5383 Other fatigue: Secondary | ICD-10-CM

## 2017-05-11 DIAGNOSIS — G35 Multiple sclerosis: Secondary | ICD-10-CM

## 2017-05-11 DIAGNOSIS — F419 Anxiety disorder, unspecified: Secondary | ICD-10-CM

## 2017-05-11 MED ORDER — TRAMADOL HCL 50 MG PO TABS: 50.0000 mg | ORAL_TABLET | Freq: Two times a day (BID) | ORAL | 3 refills | Status: DC | PRN

## 2017-05-11 MED ORDER — TRAZODONE HCL 100 MG PO TABS: 100.0000 mg | ORAL_TABLET | Freq: Every day | ORAL | 4 refills | Status: DC

## 2017-05-11 NOTE — Progress Notes (Signed)
[facility]  PATIENT: Helen Sandoval DOB: [birth year]  REFERRING DOCTOR OR PCP:  Otto Herb. ([facility])   Sinclair Ship (new PCP at [facility]) SOURCE: patient and records from [facility]  _________________________________   HISTORICAL  CHIEF COMPLAINT:  Chief Complaint  Patient presents with  . Multiple Sclerosis    Sts. she continues to tolerate Tysabri well.  JCV ab last checked 04/25/17 and was indeterminate at 0.28.  Assay was positive.  Denies new or worsening sx/fim  . Sleep Apnea    Sts. is compliant with CPAP. Brought chip for dl. Has questions about her machine/fim    HISTORY OF PRESENT ILLNESS:  Helen Sandoval is a 56 yo woman who was diagnosed with MS in 2015.        Update 05/11/2017: She feels that her MS has been mostly stable. She continues on Tysabri and tolerates it well. Last JCV antibody showed a titer of 0.28 and the patient assay shoulder that was positive. This is similar to her previous one from last year. She notes that the gait is stable. She did have one fall and hitting a coffee table. She notes slight weakness in the right leg. There is no numbness. Bladder function is fine. She does note that vision is doing worse and she plans on seeing her ophthalmologist.  She feels that her fatigue is doing well. She is on BiPAP and sleeps well most nights. She brought in her chip in the download shows 90% compliance with at greater than 4 hours at night. The AHI was 5.8 the feels that mood is doing well on her current regimen. She doesn't note any significant cognitive problems. Adderall has helped her fatigue and focus and attention. She takes tramadol now and then for her leg pain. She never takes more than 2 in any day. We discussed that the combination of tramadol and Wellbutrin can increased risk of seizures that she should never take more than 2 tramadol any day.   From 11/09/2016: MS:   She has noted more trouble with her  vision but otherwise feels about the same as the last visit. Marland Kitchen   MRI of the spine and brain 05/2016 comparedt to 2016 MRI is essentially unchanged.   The MRI of the cervical spine shows C4-C6 ACDF and has 2-3 small spine spots.   She is on Tysabri. She tolerates it well and has not had any exacerbations on it. Her JCV antibody status was indeterminate at last check (0.33 05/11/2016)   Gait/strength/sensation:   Her gait is stable but she feels off balance at times. She has had a few falls hurting her knee with a recent one. Her right leg feels mildly weaker than the left leg and she drags it when she is tired.   She denies any paresthesias.  Bladder/bowel:     Her urinary urgency and incontinence is doing better with Myrbetriq   No recent bowel incontinence.  Vision:   She feels vision is worse and she notes more diplopia and blurry vision.  This occurs randomly, not just if hot or tired.     . No eye pain.    Fatigue/sleep:   She feels that her fatigue is doing fairly well. Is mild most days but worse with heat and if she has a more active day. The fatigue is both physical and cognitive. She is sleeping fairly well most nights.    Adderall helps fatigue and also slightly helps cognitive issues.  She has OSA and is on BiPAP (Since 2012)   She was told OSA was severe.     She uses BiPAP every night for > 4 hours.        She denies excessive daytime sleepiness but has fatigue.   Her mom noted that shsnored some through her mask.   No recent download but compliance download  11/08/15 shows excellent compliance of 97% and efficacy with AHI only 3.2 on BiPAP 11/6.  Mood/cognition:  Effexor and Wellbutrin helped mood at first but she was more depressed last year and she changed Effexor to [place] is mild.   She notes difficulty with focus and has had some difficulty with verbal fluency and short term memory.      Adderall only helps a little bit.  MS History:   In 2008, she had headaches and poor  balance and was told the MRI had some white matter foci at that time.    She had spinal stenosis and needed surgery in 2014.  The surgeon felt her gait and other issues were decreased out of proportion to the extent of spine disease and referred her to Neurology.   Besides gait issues, she also had double vision, blurry vision, cognitive difficulty and incontinence in early 2015. She saw Dr. Pamalee Leyden in [place]. An MRI of the brain, CSF and visual evoked potentials were consistent with multiple sclerosis.    Initially, she was placed on Tecfidera. Due to progression with more neurologic symptoms, she was switched to Tysabri.   Her last MRI was done 10/2014 but we do not have the images.     REVIEW OF SYSTEMS: Constitutional: No fevers, chills, sweats, or change in appetite.   She notes fatigue Eyes: Reports visual changes and double vision.  No eye pain Ear, nose and throat: No hearing loss, ear pain, nasal congestion, sore throat Cardiovascular: No chest pain, palpitations Respiratory: No shortness of breath at rest or with exertion.   She has snoring and coughing but no wheezes GastrointestinaI: No nausea, vomiting, abdominal pain.  She notes diarrhea and some fecal incontinence Genitourinary: No dysuria, urinary retention or frequency. She has had some incontinence Musculoskeletal: No neck pain, back pain Integumentary: No rash, pruritus, skin lesions Neurological: as above Psychiatric: Some depression at this time.  Some anxiety Endocrine: No palpitations, diaphoresis, change in appetite, change in weigh or increased thirst Hematologic/Lymphatic: No anemia, purpura, petechiae. Allergic/Immunologic: No itchy/runny eyes, nasal congestion, recent allergic reactions, rashes  ALLERGIES: Allergies  Allergen Reactions  . Clindamycin Anaphylaxis  . Indomethacin Hives  . Levofloxacin Hives  . Penicillins Anaphylaxis  . Penicillins Cross Reactors Anaphylaxis  . Sulfa Antibiotics Swelling     Blister (steven john syndrome)  . Levaquin [Levofloxacin Hemihydrate] Hives  . Sulfamethoxazole-Trimethoprim   . Zocor [Simvastatin] Other (See Comments)    Other reaction(s): MUSCLE PAIN Muscle aches    HOME MEDICATIONS:  Current Outpatient Medications:  .  albuterol (VENTOLIN HFA) 108 (90 BASE) MCG/ACT inhaler, Inhale into the lungs., Disp: , Rfl:  .  ALPRAZolam (XANAX) 0.5 MG tablet, Take 0.5 tablets (0.25 mg total) by mouth 2 (two) times daily as needed for anxiety., Disp: 60 tablet, Rfl: 1 .  amphetamine-dextroamphetamine (ADDERALL) 10 MG tablet, Take 1 tablet (10 mg total) by mouth 3 (three) times daily., Disp: 90 tablet, Rfl: 0 .  aspirin EC 81 MG tablet, Take 81 mg by mouth every other day.  , Disp: , Rfl:  .  Biotin 10000 MCG  TABS, Take by mouth., Disp: , Rfl:  .  buPROPion (WELLBUTRIN SR) 150 MG 12 hr tablet, Take 150 mg by mouth 2 (two) times daily. , Disp: , Rfl:  .  cholecalciferol (VITAMIN D) 1000 UNITS tablet, Take 5,000 Units by mouth daily.  , Disp: , Rfl:  .  co-enzyme Q-10 30 MG capsule, Take 30 mg by mouth 3 (three) times daily., Disp: , Rfl:  .  cyclobenzaprine (FLEXERIL) 5 MG tablet, TAKE ONE TABLET BY MOUTH THREE TIMES DAILY AS NEEDED, Disp: 90 tablet, Rfl: 5 .  DULERA 200-5 MCG/ACT AERO, , Disp: , Rfl:  .  estradiol (CLIMARA - DOSED IN MG/24 HR) 0.05 mg/24hr patch, , Disp: , Rfl:  .  ezetimibe (ZETIA) 10 MG tablet, Take 10 mg by mouth., Disp: , Rfl:  .  FLUARIX QUADRIVALENT 0.5 ML injection, , Disp: , Rfl:  .  Folic Acid-Vit C5-ENI D78 (FOLBEE) 2.5-25-1 MG TABS tablet, Take 1 tablet by mouth daily., Disp: , Rfl:  .  gabapentin (NEURONTIN) 300 MG capsule, TAKE 1 CAPSULE BY MOUTH THREE TIMES DAILY, Disp: 270 capsule, Rfl: 1 .  HYDROcodone-acetaminophen (VICODIN) 5-325 mg TABS tablet, 1 tablet as needed every 8 hours, Disp: 60 tablet, Rfl: 0 .  Krill Oil 1000 MG CAPS, Take 1,000 mg by mouth., Disp: , Rfl:  .  MYRBETRIQ 50 MG TB24 tablet, TAKE ONE TABLET BY MOUTH ONCE  DAILY, Disp: 90 tablet, Rfl: 3 .  natalizumab (TYSABRI) 300 MG/15ML injection, Inject 300 mg into the vein., Disp: , Rfl:  .  omeprazole (PRILOSEC) 20 MG capsule, Take 20 mg by mouth., Disp: , Rfl:  .  traMADol (ULTRAM) 50 MG tablet, Take 1 tablet (50 mg total) by mouth every 12 (twelve) hours as needed., Disp: 60 tablet, Rfl: 3 .  traZODone (DESYREL) 100 MG tablet, Take 1 tablet (100 mg total) by mouth at bedtime., Disp: 90 tablet, Rfl: 4 .  TURMERIC PO, Take by mouth., Disp: , Rfl:  .  Vilazodone HCl (VIIBRYD) 20 MG TABS, Take 20 mg by mouth daily., Disp: , Rfl:   PAST MEDICAL HISTORY: Past Medical History:  Diagnosis Date  . Ankle fracture   . Osteopenia   . Scarlet fever   . Shingles   . Sleep apnea   . Spina bifida   . Vitamin D deficiency     PAST SURGICAL HISTORY: Past Surgical History:  Procedure Laterality Date  . ABDOMINAL HYSTERECTOMY      FAMILY HISTORY: History reviewed. No pertinent family history.  SOCIAL HISTORY:  Social History   Socioeconomic History  . Marital status: Divorced    Spouse name: Not on file  . Number of children: Not on file  . Years of education: Not on file  . Highest education level: Not on file  Social Needs  . Financial resource strain: Not on file  . Food insecurity - worry: Not on file  . Food insecurity - inability: Not on file  . Transportation needs - medical: Not on file  . Transportation needs - non-medical: Not on file  Occupational History  . Not on file  Tobacco Use  . Smoking status: Current Some Day Smoker  . Smokeless tobacco: Never Used  Substance and Sexual Activity  . Alcohol use: No  . Drug use: No  . Sexual activity: Not on file  Other Topics Concern  . Not on file  Social History Narrative  . Not on file     PHYSICAL EXAM  Vitals:  05/11/17 1258  BP: 136/90  Pulse: 86  Resp: 16  Weight: 197 lb (89.4 kg)  Height: 5\' 4"  (1.626 m)    Body mass index is 33.81 kg/m.   General: The patient  is well-developed and well-nourished and in no acute distress  Neck: The neck is supple with good ROM and nontender.      Neurologic Exam  Mental status: The patient is alert and oriented x 3 at the time of the examination. The patient has apparent normal recent and remote memory, with an apparently normal attention span and concentration ability.   Speech is normal.  Cranial nerves: Extraocular movements are full.  She reports mild reduced sensation in the right face. Facial strength is normal and symmetric. Trapezius and sternocleidomastoid strength is normal.. No dysarthria is noted.  No obvious hearing deficits are noted.  Motor:  Muscle bulk is normal.   Tone is normal. Strength is  5 / 5 in all 4 extremities.   Sensory: She has normal touch and vibration sensation in the arms or legs..  Coordination: Cerebellar testing reveals good finger-nose-finger and reduced heel-to-shin bilaterally.  Gait and station: Station is normal.   The gait is mildly wide. Tandem gait is wide. Romberg is negative  Reflexes: Deep tendon reflexes are symmetric and 2+ arms and 3+ knees with spread.   No ankle clonus.    DIAGNOSTIC DATA (LABS, IMAGING, TESTING) - I reviewed patient records, labs, notes, testing and imaging myself where available.       ASSESSMENT AND PLAN   Multiple sclerosis (HCC)  Leg pain, bilateral - Plan: traMADol (ULTRAM) 50 MG tablet  Gait disturbance  Anxiety and depression  Other fatigue  OSA treated with BiPAP   1.   She will continue Tysabri 300 mg every 4 weeks. I will check his JCV antibody today.   Also check CBC. 2.   Continue medications for fatigue and bladder dysfunction. 3.   Continue BiPAP. Download shows excellent compliance and good efficacy.  4.   Return in 6 months or sooner if there are new or worsening neurologic symptoms.   Analayah Brooke A. Felecia Shelling, MD, PhD 41/66/0630, 1:60 PM Certified in Neurology, Clinical Neurophysiology, Sleep Medicine, Pain  Medicine and Neuroimaging . [facility] [address], [place],  [address] [phone]

## 2017-05-19 ENCOUNTER — Telehealth: Payer: Self-pay | Admitting: Neurology

## 2017-05-19 NOTE — Telephone Encounter (Signed)
Denisa/[employer] [phone] calling PA is required for MYRBETRIQ 50 MG TB24 tablet . She is faxing PA to [phone]  She is aware the RN and provider will return to the clinic on 05/24/16  [facility]

## 2017-05-22 ENCOUNTER — Other Ambulatory Visit: Payer: Self-pay | Admitting: Neurology

## 2017-05-24 ENCOUNTER — Other Ambulatory Visit: Payer: Self-pay | Admitting: Neurology

## 2017-05-24 NOTE — Telephone Encounter (Signed)
Noted/fim 

## 2017-05-25 ENCOUNTER — Telehealth: Payer: Self-pay | Admitting: *Deleted

## 2017-05-25 NOTE — Telephone Encounter (Signed)
PA for Myrbetriq 50mg  completed and faxed to [facility], fax# [phone].  Dx: MS (G35), urinary urgency and incontinence, mixed incontinence (N39.46, N39.41).  Tried and failed meds: Oxybutynin, Detrol LA, Tamsulosin/fim

## 2017-06-01 ENCOUNTER — Encounter: Payer: Self-pay | Admitting: Neurology

## 2017-06-01 MED ORDER — AMPHETAMINE-DEXTROAMPHETAMINE 10 MG PO TABS: 10.0000 mg | ORAL_TABLET | Freq: Three times a day (TID) | ORAL | 0 refills | Status: DC

## 2017-06-02 DIAGNOSIS — G35 Multiple sclerosis: Secondary | ICD-10-CM | POA: Diagnosis not present

## 2017-06-08 DIAGNOSIS — R5383 Other fatigue: Secondary | ICD-10-CM | POA: Diagnosis not present

## 2017-06-08 DIAGNOSIS — G4730 Sleep apnea, unspecified: Secondary | ICD-10-CM | POA: Diagnosis not present

## 2017-06-08 DIAGNOSIS — N3941 Urge incontinence: Secondary | ICD-10-CM | POA: Diagnosis not present

## 2017-06-08 DIAGNOSIS — R269 Unspecified abnormalities of gait and mobility: Secondary | ICD-10-CM | POA: Diagnosis not present

## 2017-06-23 ENCOUNTER — Other Ambulatory Visit: Payer: Self-pay | Admitting: Obstetrics & Gynecology

## 2017-06-23 DIAGNOSIS — H5213 Myopia, bilateral: Secondary | ICD-10-CM | POA: Diagnosis not present

## 2017-06-23 DIAGNOSIS — H5203 Hypermetropia, bilateral: Secondary | ICD-10-CM | POA: Diagnosis not present

## 2017-06-23 DIAGNOSIS — H52209 Unspecified astigmatism, unspecified eye: Secondary | ICD-10-CM | POA: Diagnosis not present

## 2017-06-23 DIAGNOSIS — Z1231 Encounter for screening mammogram for malignant neoplasm of breast: Secondary | ICD-10-CM

## 2017-06-26 ENCOUNTER — Encounter: Payer: Self-pay | Admitting: Neurology

## 2017-06-27 DIAGNOSIS — G35 Multiple sclerosis: Secondary | ICD-10-CM | POA: Diagnosis not present

## 2017-06-27 MED ORDER — AMPHETAMINE-DEXTROAMPHETAMINE 10 MG PO TABS: 10.0000 mg | ORAL_TABLET | Freq: Three times a day (TID) | ORAL | 0 refills | Status: DC

## 2017-07-09 DIAGNOSIS — G4730 Sleep apnea, unspecified: Secondary | ICD-10-CM | POA: Diagnosis not present

## 2017-07-09 DIAGNOSIS — R269 Unspecified abnormalities of gait and mobility: Secondary | ICD-10-CM | POA: Diagnosis not present

## 2017-07-09 DIAGNOSIS — R5383 Other fatigue: Secondary | ICD-10-CM | POA: Diagnosis not present

## 2017-07-09 DIAGNOSIS — N3941 Urge incontinence: Secondary | ICD-10-CM | POA: Diagnosis not present

## 2017-07-25 ENCOUNTER — Encounter: Payer: Self-pay | Admitting: Neurology

## 2017-07-25 DIAGNOSIS — G35 Multiple sclerosis: Secondary | ICD-10-CM | POA: Diagnosis not present

## 2017-07-25 MED ORDER — AMPHETAMINE-DEXTROAMPHETAMINE 10 MG PO TABS: 10.0000 mg | ORAL_TABLET | Freq: Three times a day (TID) | ORAL | 0 refills | Status: DC

## 2017-07-27 DIAGNOSIS — G4733 Obstructive sleep apnea (adult) (pediatric): Secondary | ICD-10-CM | POA: Diagnosis not present

## 2017-08-06 DIAGNOSIS — R5383 Other fatigue: Secondary | ICD-10-CM | POA: Diagnosis not present

## 2017-08-06 DIAGNOSIS — N3941 Urge incontinence: Secondary | ICD-10-CM | POA: Diagnosis not present

## 2017-08-06 DIAGNOSIS — R269 Unspecified abnormalities of gait and mobility: Secondary | ICD-10-CM | POA: Diagnosis not present

## 2017-08-06 DIAGNOSIS — G4730 Sleep apnea, unspecified: Secondary | ICD-10-CM | POA: Diagnosis not present

## 2017-08-07 ENCOUNTER — Ambulatory Visit
Admission: RE | Admit: 2017-08-07 | Discharge: 2017-08-07 | Disposition: A | Discharge status: Home / Self Care | Payer: Medicare HMO | Source: Ambulatory Visit | Attending: Obstetrics & Gynecology | Admitting: Obstetrics & Gynecology

## 2017-08-07 DIAGNOSIS — Z1231 Encounter for screening mammogram for malignant neoplasm of breast: Secondary | ICD-10-CM

## 2017-08-22 ENCOUNTER — Encounter: Payer: Self-pay | Admitting: Neurology

## 2017-08-22 DIAGNOSIS — G35 Multiple sclerosis: Secondary | ICD-10-CM | POA: Diagnosis not present

## 2017-08-22 MED ORDER — AMPHETAMINE-DEXTROAMPHETAMINE 10 MG PO TABS: 10.0000 mg | ORAL_TABLET | Freq: Three times a day (TID) | ORAL | 0 refills | Status: DC

## 2017-08-29 ENCOUNTER — Encounter: Payer: Self-pay | Admitting: *Deleted

## 2017-08-31 DIAGNOSIS — M25562 Pain in left knee: Secondary | ICD-10-CM | POA: Diagnosis not present

## 2017-09-06 DIAGNOSIS — R5383 Other fatigue: Secondary | ICD-10-CM | POA: Diagnosis not present

## 2017-09-06 DIAGNOSIS — G4730 Sleep apnea, unspecified: Secondary | ICD-10-CM | POA: Diagnosis not present

## 2017-09-06 DIAGNOSIS — R269 Unspecified abnormalities of gait and mobility: Secondary | ICD-10-CM | POA: Diagnosis not present

## 2017-09-06 DIAGNOSIS — N3941 Urge incontinence: Secondary | ICD-10-CM | POA: Diagnosis not present

## 2017-09-17 ENCOUNTER — Other Ambulatory Visit: Payer: Self-pay | Admitting: Neurology

## 2017-09-18 ENCOUNTER — Encounter: Payer: Self-pay | Admitting: Neurology

## 2017-09-19 DIAGNOSIS — G35 Multiple sclerosis: Secondary | ICD-10-CM | POA: Diagnosis not present

## 2017-09-19 MED ORDER — AMPHETAMINE-DEXTROAMPHETAMINE 10 MG PO TABS: 10.0000 mg | ORAL_TABLET | Freq: Three times a day (TID) | ORAL | 0 refills | Status: DC

## 2017-10-03 DIAGNOSIS — E782 Mixed hyperlipidemia: Secondary | ICD-10-CM | POA: Diagnosis not present

## 2017-10-06 DIAGNOSIS — G4730 Sleep apnea, unspecified: Secondary | ICD-10-CM | POA: Diagnosis not present

## 2017-10-06 DIAGNOSIS — N3941 Urge incontinence: Secondary | ICD-10-CM | POA: Diagnosis not present

## 2017-10-06 DIAGNOSIS — R5383 Other fatigue: Secondary | ICD-10-CM | POA: Diagnosis not present

## 2017-10-06 DIAGNOSIS — R269 Unspecified abnormalities of gait and mobility: Secondary | ICD-10-CM | POA: Diagnosis not present

## 2017-10-10 DIAGNOSIS — E782 Mixed hyperlipidemia: Secondary | ICD-10-CM | POA: Diagnosis not present

## 2017-10-10 DIAGNOSIS — K5730 Diverticulosis of large intestine without perforation or abscess without bleeding: Secondary | ICD-10-CM | POA: Diagnosis not present

## 2017-10-10 DIAGNOSIS — G35 Multiple sclerosis: Secondary | ICD-10-CM | POA: Diagnosis not present

## 2017-10-18 DIAGNOSIS — G35 Multiple sclerosis: Secondary | ICD-10-CM | POA: Diagnosis not present

## 2017-10-27 DIAGNOSIS — G4733 Obstructive sleep apnea (adult) (pediatric): Secondary | ICD-10-CM | POA: Diagnosis not present

## 2017-11-06 DIAGNOSIS — N3941 Urge incontinence: Secondary | ICD-10-CM | POA: Diagnosis not present

## 2017-11-06 DIAGNOSIS — G4730 Sleep apnea, unspecified: Secondary | ICD-10-CM | POA: Diagnosis not present

## 2017-11-06 DIAGNOSIS — R5383 Other fatigue: Secondary | ICD-10-CM | POA: Diagnosis not present

## 2017-11-06 DIAGNOSIS — R269 Unspecified abnormalities of gait and mobility: Secondary | ICD-10-CM | POA: Diagnosis not present

## 2017-11-09 ENCOUNTER — Ambulatory Visit (INDEPENDENT_AMBULATORY_CARE_PROVIDER_SITE_OTHER): Payer: Medicare HMO | Admitting: Neurology

## 2017-11-09 ENCOUNTER — Encounter: Payer: Self-pay | Admitting: Neurology

## 2017-11-09 VITALS — BP 150/93 | HR 90 | Ht 64.0 in | Wt 206.0 lb

## 2017-11-09 DIAGNOSIS — R269 Unspecified abnormalities of gait and mobility: Secondary | ICD-10-CM | POA: Diagnosis not present

## 2017-11-09 DIAGNOSIS — Z79899 Other long term (current) drug therapy: Secondary | ICD-10-CM | POA: Diagnosis not present

## 2017-11-09 DIAGNOSIS — G4733 Obstructive sleep apnea (adult) (pediatric): Secondary | ICD-10-CM

## 2017-11-09 DIAGNOSIS — G250 Essential tremor: Secondary | ICD-10-CM | POA: Insufficient documentation

## 2017-11-09 DIAGNOSIS — G35 Multiple sclerosis: Secondary | ICD-10-CM

## 2017-11-09 DIAGNOSIS — F329 Major depressive disorder, single episode, unspecified: Secondary | ICD-10-CM

## 2017-11-09 DIAGNOSIS — F419 Anxiety disorder, unspecified: Secondary | ICD-10-CM

## 2017-11-09 DIAGNOSIS — R5383 Other fatigue: Secondary | ICD-10-CM | POA: Diagnosis not present

## 2017-11-09 NOTE — Progress Notes (Signed)
[facility]  PATIENT: Helen Sandoval DOB: 11-16-1960  REFERRING DOCTOR OR PCP:  Otto Herb. ([facility])   Sinclair Ship (new PCP at [facility]) SOURCE: patient and records from [facility]  _________________________________   HISTORICAL  CHIEF COMPLAINT:  Chief Complaint  Patient presents with  . Multiple Sclerosis    Patient reports having some balance issues and has had a couple of falls.     HISTORY OF PRESENT ILLNESS:  Helen Sandoval is a 57 yo woman who was diagnosed with MS in 2015.       Update 11/09/2017: She is on Tysabri.  She tolerates the infusions well.   Her last JCV antibody was 04/25/2017.  It was indeterminate at 0.28.  However, the inhibition assay was positive.  This would imply a risk of Tysabri possibly in the 1: 2000 range.  Most of her symptoms are mostly the same.  However, she notes a little more difficulty with her balance and she has had a couple falls.  One fall hurt her knee and she had fluid drained.   Both falls were forward.   She did not trip.   She hit her head with the one fall but no LOc.   She has some difficulty with vision.  Bladder function is fine.   She has fast low amplitude tremor, worse with intention.  Her mom had a tremor.    Her fatigue has done fairly well.  She is sleeping well with BiPAP.  She has had excellent compliance and efficacy on download.  She denies any significant depression or anxiety.  She notes mild cognitive problems like reduced focus and attention, STM and processing.  Adderall has helped the fatigue and the attentional deficits.   She takes 20-30 mg daily.   She has tramadol but tries to avoid it if she can.     She is also on Wellbutrin and knows not to take more than 2 tramadol's in any 24-hour period.   She has noted some neck pain and also     She notes more sweating  Update 05/11/2017: She feels that her MS has been mostly stable. She continues on Tysabri and tolerates it well.  Last JCV antibody showed a titer of 0.28 and the inhib assay was positive. This is similar to her previous one from last year. She notes that the gait is stable. She did have one fall and hitting a coffee table. She notes slight weakness in the right leg. There is no numbness. Bladder function is fine. She does note that vision is doing worse and she plans on seeing her ophthalmologist.  She feels that her fatigue is doing well. She is on BiPAP and sleeps well most nights. She brought in her chip in the download shows 90% compliance with at greater than 4 hours at night. The AHI was 5.8 the feels that mood is doing well on her current regimen. She doesn't note any significant cognitive problems. Adderall has helped her fatigue and focus and attention. She takes tramadol now and then for her leg pain. She never takes more than 2 in any day. We discussed that the combination of tramadol and Wellbutrin can increased risk of seizures that she should never take more than 2 tramadol any day.   From 11/09/2016: MS:   She has noted more trouble with her vision but otherwise feels about the same as the last visit. Marland Kitchen   MRI of the spine and brain 05/2016 comparedt to  2016 MRI is essentially unchanged.   The MRI of the cervical spine shows C4-C6 ACDF and has 2-3 small spine spots.   She is on Tysabri. She tolerates it well and has not had any exacerbations on it. Her JCV antibody status was indeterminate at last check (0.33 05/11/2016)   Gait/strength/sensation:   Her gait is stable but she feels off balance at times. She has had a few falls hurting her knee with a recent one. Her right leg feels mildly weaker than the left leg and she drags it when she is tired.   She denies any paresthesias.  Bladder/bowel:     Her urinary urgency and incontinence is doing better with Myrbetriq   No recent bowel incontinence.  Vision:   She feels vision is worse and she notes more diplopia and blurry vision.  This occurs randomly,  not just if hot or tired.     . No eye pain.    Fatigue/sleep:   She feels that her fatigue is doing fairly well. Is mild most days but worse with heat and if she has a more active day. The fatigue is both physical and cognitive. She is sleeping fairly well most nights.    Adderall helps fatigue and also slightly helps cognitive issues.    She has OSA and is on BiPAP (Since 2012)   She was told OSA was severe.     She uses BiPAP every night for > 4 hours.        She denies excessive daytime sleepiness but has fatigue.   Her mom noted that shsnored some through her mask.   No recent download but compliance download  11/08/15 shows excellent compliance of 97% and efficacy with AHI only 3.2 on BiPAP 11/6.  Mood/cognition:  Effexor and Wellbutrin helped mood at first but she was more depressed last year and she changed Effexor to [place] is mild.   She notes difficulty with focus and has had some difficulty with verbal fluency and short term memory.      Adderall only helps a little bit.  MS History:   In 2008, she had headaches and poor balance and was told the MRI had some white matter foci at that time.    She had spinal stenosis and needed surgery in 2014.  The surgeon felt her gait and other issues were decreased out of proportion to the extent of spine disease and referred her to Neurology.   Besides gait issues, she also had double vision, blurry vision, cognitive difficulty and incontinence in early 2015. She saw Dr. Pamalee Leyden in [place]. An MRI of the brain, CSF and visual evoked potentials were consistent with multiple sclerosis.    Initially, she was placed on Tecfidera. Due to progression with more neurologic symptoms, she was switched to Tysabri.   Her last MRI was done 10/2014 but we do not have the images.     REVIEW OF SYSTEMS: Constitutional: No fevers, chills, sweats, or change in appetite.   She notes fatigue Eyes: Reports visual changes and double vision.  No eye pain Ear, nose  and throat: No hearing loss, ear pain, nasal congestion, sore throat Cardiovascular: No chest pain, palpitations Respiratory: No shortness of breath at rest or with exertion.   She has snoring and coughing but no wheezes GastrointestinaI: No nausea, vomiting, abdominal pain.  She notes diarrhea and some fecal incontinence Genitourinary: No dysuria, urinary retention or frequency. She has had some incontinence Musculoskeletal: No neck  pain, back pain Integumentary: No rash, pruritus, skin lesions Neurological: as above Psychiatric: Some depression at this time.  Some anxiety Endocrine: No palpitations, diaphoresis, change in appetite, change in weigh or increased thirst Hematologic/Lymphatic: No anemia, purpura, petechiae. Allergic/Immunologic: No itchy/runny eyes, nasal congestion, recent allergic reactions, rashes  ALLERGIES: Allergies  Allergen Reactions  . Clindamycin Anaphylaxis  . Indomethacin Hives  . Levofloxacin Hives  . Penicillins Anaphylaxis  . Penicillins Cross Reactors Anaphylaxis  . Sulfa Antibiotics Swelling    Blister (steven john syndrome)  . Levaquin [Levofloxacin Hemihydrate] Hives  . Lipitor  [Atorvastatin Calcium]   . Sulfamethoxazole-Trimethoprim   . Zocor [Simvastatin] Other (See Comments)    Other reaction(s): MUSCLE PAIN Muscle aches    HOME MEDICATIONS:  Current Outpatient Medications:  .  albuterol (VENTOLIN HFA) 108 (90 BASE) MCG/ACT inhaler, Inhale into the lungs., Disp: , Rfl:  .  amphetamine-dextroamphetamine (ADDERALL) 10 MG tablet, Take 1 tablet (10 mg total) by mouth 3 (three) times daily., Disp: 90 tablet, Rfl: 0 .  aspirin EC 81 MG tablet, Take 81 mg by mouth every other day.  , Disp: , Rfl:  .  Biotin 10000 MCG TABS, Take by mouth., Disp: , Rfl:  .  buPROPion (WELLBUTRIN SR) 150 MG 12 hr tablet, Take 150 mg by mouth 2 (two) times daily. , Disp: , Rfl:  .  cholecalciferol (VITAMIN D) 1000 UNITS tablet, Take 5,000 Units by mouth daily.  ,  Disp: , Rfl:  .  co-enzyme Q-10 30 MG capsule, Take 30 mg by mouth 3 (three) times daily., Disp: , Rfl:  .  cyclobenzaprine (FLEXERIL) 5 MG tablet, TAKE 1 TABLET BY MOUTH THREE TIMES DAILY AS NEEDED, Disp: 90 tablet, Rfl: 5 .  DULERA 200-5 MCG/ACT AERO, , Disp: , Rfl:  .  estradiol (CLIMARA - DOSED IN MG/24 HR) 0.0375 mg/24hr patch, estradiol 0.0375 mg/24 hr weekly transdermal patch, Disp: , Rfl:  .  ezetimibe (ZETIA) 10 MG tablet, Take 10 mg by mouth., Disp: , Rfl:  .  FLUARIX QUADRIVALENT 0.5 ML injection, , Disp: , Rfl:  .  gabapentin (NEURONTIN) 300 MG capsule, TAKE 1 CAPSULE BY MOUTH THREE TIMES DAILY, Disp: 270 capsule, Rfl: 1 .  HYDROcodone-acetaminophen (VICODIN) 5-325 mg TABS tablet, 1 tablet as needed every 8 hours, Disp: 60 tablet, Rfl: 0 .  Krill Oil 1000 MG CAPS, Take 1,000 mg by mouth., Disp: , Rfl:  .  MYRBETRIQ 50 MG TB24 tablet, TAKE ONE TABLET BY MOUTH ONCE DAILY, Disp: 90 tablet, Rfl: 3 .  natalizumab (TYSABRI) 300 MG/15ML injection, Inject 300 mg into the vein., Disp: , Rfl:  .  omeprazole (PRILOSEC) 20 MG capsule, Take 20 mg by mouth., Disp: , Rfl:  .  traMADol (ULTRAM) 50 MG tablet, Take 1 tablet (50 mg total) by mouth every 12 (twelve) hours as needed., Disp: 60 tablet, Rfl: 3 .  traZODone (DESYREL) 100 MG tablet, TAKE ONE TABLET BY MOUTH AT BEDTIME, Disp: 90 tablet, Rfl: 4 .  TURMERIC PO, Take by mouth., Disp: , Rfl:  .  venlafaxine XR (EFFEXOR XR) 150 MG 24 hr capsule, Take 2 capsules by mouth daily., Disp: , Rfl:   PAST MEDICAL HISTORY: Past Medical History:  Diagnosis Date  . Ankle fracture   . Osteopenia   . Scarlet fever   . Shingles   . Sleep apnea   . Spina bifida   . Vitamin D deficiency     PAST SURGICAL HISTORY: Past Surgical History:  Procedure Laterality Date  .  ABDOMINAL HYSTERECTOMY      FAMILY HISTORY: History reviewed. No pertinent family history.  SOCIAL HISTORY:  Social History   Socioeconomic History  . Marital status: Divorced     Spouse name: Not on file  . Number of children: Not on file  . Years of education: Not on file  . Highest education level: Not on file  Occupational History  . Not on file  Social Needs  . Financial resource strain: Not on file  . Food insecurity:    Worry: Not on file    Inability: Not on file  . Transportation needs:    Medical: Not on file    Non-medical: Not on file  Tobacco Use  . Smoking status: Current Some Day Smoker  . Smokeless tobacco: Never Used  Substance and Sexual Activity  . Alcohol use: No  . Drug use: No  . Sexual activity: Not on file  Lifestyle  . Physical activity:    Days per week: Not on file    Minutes per session: Not on file  . Stress: Not on file  Relationships  . Social connections:    Talks on phone: Not on file    Gets together: Not on file    Attends religious service: Not on file    Active member of club or organization: Not on file    Attends meetings of clubs or organizations: Not on file    Relationship status: Not on file  . Intimate partner violence:    Fear of current or ex partner: Not on file    Emotionally abused: Not on file    Physically abused: Not on file    Forced sexual activity: Not on file  Other Topics Concern  . Not on file  Social History Narrative  . Not on file     PHYSICAL EXAM  Vitals:   11/09/17 1247  BP: (!) 150/93  Pulse: 90  Weight: 206 lb (93.4 kg)  Height: 5\' 4"  (1.626 m)    Body mass index is 35.36 kg/m.   General: The patient is well-developed and well-nourished and in no acute distress  Neck: The neck is supple with good ROM and nontender.      Neurologic Exam  Mental status: The patient is alert and oriented x 3 at the time of the examination. The patient has apparent normal recent and remote memory, with an apparently normal attention span and concentration ability.   Speech is normal.  Cranial nerves: Extraocular movements are full.  She reports mild reduced sensation in the right  face. Facial strength is normal and symmetric.  Trapezius strength is normal.  No dysarthria is noted.  No obvious hearing deficits are noted.  Motor:  Muscle bulk is normal.   Tone is normal. Strength is  5 / 5 in all 4 extremities.   Sensory: She has normal touch and vibration sensation in the arms or legs..  Coordination: Cerebellar testing reveals good finger-nose-finger and reduced heel-to-shin bilaterally.  Gait and station: Station is normal.   Her gait is mildly wide.  The tandem gait is wide.  The Romberg is negative.  Reflexes: Deep tendon reflexes are symmetric and 2+ arms and 3+ knees with spread.   No ankle clonus.    DIAGNOSTIC DATA (LABS, IMAGING, TESTING) - I reviewed patient records, labs, notes, testing and imaging myself where available.       ASSESSMENT AND PLAN   Multiple sclerosis ([place]) - Plan: Stratify JCV Antibody Test ([facility]), CBC  with Differential/Platelet, TSH  Anxiety and depression  Gait disturbance  Other fatigue  Benign essential tremor  High risk medication use - Plan: Stratify JCV Antibody Test ([facility]), CBC with Differential/Platelet, TSH  OSA treated with BiPAP   1.   She will continue Tysabri 300 mg every 4 weeks. I will check his JCV antibody today.   Also check CBC. 2.   Continue other medications for MS symptoms 3.   Continue BiPAP.  Download in the past has shown good compliance and efficacy. 4.   Return in 6 months or sooner if there are new or worsening neurologic symptoms.   Helen Sandoval A. Felecia Shelling, MD, PhD 7/68/1157, 2:62 PM Certified in Neurology, Clinical Neurophysiology, Sleep Medicine, Pain Medicine and Neuroimaging . [facility] [address] [place], [place] [address] [phone]

## 2017-11-10 LAB — CBC WITH DIFFERENTIAL/PLATELET
Basophils Absolute: 0 10*3/uL (ref 0.0–0.2)
Basos: 1 %
EOS (ABSOLUTE): 0.1 10*3/uL (ref 0.0–0.4)
Eos: 2 %
Hematocrit: 41 % (ref 34.0–46.6)
Hemoglobin: 13.7 g/dL (ref 11.1–15.9)
Immature Grans (Abs): 0 10*3/uL (ref 0.0–0.1)
Immature Granulocytes: 1 %
Lymphocytes Absolute: 2.1 10*3/uL (ref 0.7–3.1)
Lymphs: 33 %
MCH: 31.1 pg (ref 26.6–33.0)
MCHC: 33.4 g/dL (ref 31.5–35.7)
MCV: 93 fL (ref 79–97)
Monocytes Absolute: 0.4 10*3/uL (ref 0.1–0.9)
Monocytes: 6 %
Neutrophils Absolute: 3.7 10*3/uL (ref 1.4–7.0)
Neutrophils: 57 %
Platelets: 158 10*3/uL (ref 150–450)
RBC: 4.4 x10E6/uL (ref 3.77–5.28)
RDW: 15.2 % (ref 12.3–15.4)
WBC: 6.3 10*3/uL (ref 3.4–10.8)

## 2017-11-10 LAB — TSH: TSH: 2.74 u[IU]/mL (ref 0.450–4.500)

## 2017-11-16 ENCOUNTER — Encounter: Payer: Self-pay | Admitting: *Deleted

## 2017-11-17 ENCOUNTER — Telehealth: Payer: Self-pay | Admitting: Neurology

## 2017-11-17 NOTE — Telephone Encounter (Signed)
Her JCV antibody titer increased to 0.61.  I discussed with her that her risk of PML is still low but may be higher than it was when her antibody level was lower.  There is some data that extended interval dosing might reduce the risk of PML so I would like to extend the interval to 6 weeks in between doses starting after her next dose (already scheduled for early next week).  Faith, please let the nurses in the infusion room know

## 2017-11-19 ENCOUNTER — Encounter: Payer: Self-pay | Admitting: Neurology

## 2017-11-20 DIAGNOSIS — G35 Multiple sclerosis: Secondary | ICD-10-CM | POA: Diagnosis not present

## 2017-11-20 MED ORDER — AMPHETAMINE-DEXTROAMPHETAMINE 10 MG PO TABS: 10.0000 mg | ORAL_TABLET | Freq: Three times a day (TID) | ORAL | 0 refills | Status: DC

## 2017-11-20 NOTE — Telephone Encounter (Signed)
Noted.  I have spoken with Otila Kluver and advised that pt's Ty should be pushed to every 6 wks. following her next infusion/fim

## 2017-12-04 DIAGNOSIS — Z01419 Encounter for gynecological examination (general) (routine) without abnormal findings: Secondary | ICD-10-CM | POA: Diagnosis not present

## 2017-12-06 ENCOUNTER — Other Ambulatory Visit: Payer: Self-pay | Admitting: Obstetrics & Gynecology

## 2017-12-06 DIAGNOSIS — R269 Unspecified abnormalities of gait and mobility: Secondary | ICD-10-CM | POA: Diagnosis not present

## 2017-12-06 DIAGNOSIS — N3941 Urge incontinence: Secondary | ICD-10-CM | POA: Diagnosis not present

## 2017-12-06 DIAGNOSIS — G4730 Sleep apnea, unspecified: Secondary | ICD-10-CM | POA: Diagnosis not present

## 2017-12-06 DIAGNOSIS — R5383 Other fatigue: Secondary | ICD-10-CM | POA: Diagnosis not present

## 2017-12-06 DIAGNOSIS — E2839 Other primary ovarian failure: Secondary | ICD-10-CM

## 2018-01-01 DIAGNOSIS — G35 Multiple sclerosis: Secondary | ICD-10-CM | POA: Diagnosis not present

## 2018-01-06 DIAGNOSIS — G4730 Sleep apnea, unspecified: Secondary | ICD-10-CM | POA: Diagnosis not present

## 2018-01-06 DIAGNOSIS — N3941 Urge incontinence: Secondary | ICD-10-CM | POA: Diagnosis not present

## 2018-01-06 DIAGNOSIS — R5383 Other fatigue: Secondary | ICD-10-CM | POA: Diagnosis not present

## 2018-01-06 DIAGNOSIS — R269 Unspecified abnormalities of gait and mobility: Secondary | ICD-10-CM | POA: Diagnosis not present

## 2018-01-18 ENCOUNTER — Other Ambulatory Visit: Payer: Medicare HMO

## 2018-01-29 DIAGNOSIS — G4733 Obstructive sleep apnea (adult) (pediatric): Secondary | ICD-10-CM | POA: Diagnosis not present

## 2018-02-12 MED ORDER — AMPHETAMINE-DEXTROAMPHETAMINE 10 MG PO TABS: 10.0000 mg | ORAL_TABLET | Freq: Three times a day (TID) | ORAL | 0 refills | Status: DC

## 2018-02-14 DIAGNOSIS — Z23 Encounter for immunization: Secondary | ICD-10-CM | POA: Diagnosis not present

## 2018-02-15 DIAGNOSIS — G35 Multiple sclerosis: Secondary | ICD-10-CM | POA: Diagnosis not present

## 2018-02-20 ENCOUNTER — Encounter: Payer: Self-pay | Admitting: *Deleted

## 2018-02-20 DIAGNOSIS — G35 Multiple sclerosis: Secondary | ICD-10-CM | POA: Diagnosis not present

## 2018-02-20 DIAGNOSIS — Z0000 Encounter for general adult medical examination without abnormal findings: Secondary | ICD-10-CM | POA: Diagnosis not present

## 2018-02-20 DIAGNOSIS — K5730 Diverticulosis of large intestine without perforation or abscess without bleeding: Secondary | ICD-10-CM | POA: Diagnosis not present

## 2018-02-20 DIAGNOSIS — E782 Mixed hyperlipidemia: Secondary | ICD-10-CM | POA: Diagnosis not present

## 2018-02-27 DIAGNOSIS — J40 Bronchitis, not specified as acute or chronic: Secondary | ICD-10-CM | POA: Diagnosis not present

## 2018-02-27 DIAGNOSIS — G4733 Obstructive sleep apnea (adult) (pediatric): Secondary | ICD-10-CM | POA: Diagnosis not present

## 2018-02-27 DIAGNOSIS — E6609 Other obesity due to excess calories: Secondary | ICD-10-CM | POA: Diagnosis not present

## 2018-02-27 DIAGNOSIS — Z0000 Encounter for general adult medical examination without abnormal findings: Secondary | ICD-10-CM | POA: Diagnosis not present

## 2018-02-27 DIAGNOSIS — G35 Multiple sclerosis: Secondary | ICD-10-CM | POA: Diagnosis not present

## 2018-02-27 DIAGNOSIS — M5030 Other cervical disc degeneration, unspecified cervical region: Secondary | ICD-10-CM | POA: Diagnosis not present

## 2018-02-27 DIAGNOSIS — E782 Mixed hyperlipidemia: Secondary | ICD-10-CM | POA: Diagnosis not present

## 2018-02-27 DIAGNOSIS — G43909 Migraine, unspecified, not intractable, without status migrainosus: Secondary | ICD-10-CM | POA: Diagnosis not present

## 2018-02-27 DIAGNOSIS — J4530 Mild persistent asthma, uncomplicated: Secondary | ICD-10-CM | POA: Diagnosis not present

## 2018-03-08 DIAGNOSIS — J22 Unspecified acute lower respiratory infection: Secondary | ICD-10-CM | POA: Diagnosis not present

## 2018-03-08 DIAGNOSIS — R05 Cough: Secondary | ICD-10-CM | POA: Diagnosis not present

## 2018-03-08 DIAGNOSIS — R0602 Shortness of breath: Secondary | ICD-10-CM | POA: Diagnosis not present

## 2018-03-09 ENCOUNTER — Ambulatory Visit
Admission: RE | Admit: 2018-03-09 | Discharge: 2018-03-09 | Disposition: A | Discharge status: Home / Self Care | Payer: Medicare HMO | Source: Ambulatory Visit | Attending: Obstetrics & Gynecology | Admitting: Obstetrics & Gynecology

## 2018-03-09 DIAGNOSIS — Z780 Asymptomatic menopausal state: Secondary | ICD-10-CM | POA: Diagnosis not present

## 2018-03-09 DIAGNOSIS — M85852 Other specified disorders of bone density and structure, left thigh: Secondary | ICD-10-CM | POA: Diagnosis not present

## 2018-03-09 DIAGNOSIS — E2839 Other primary ovarian failure: Secondary | ICD-10-CM

## 2018-03-30 ENCOUNTER — Other Ambulatory Visit: Payer: Self-pay | Admitting: *Deleted

## 2018-03-30 DIAGNOSIS — G35 Multiple sclerosis: Secondary | ICD-10-CM | POA: Diagnosis not present

## 2018-03-30 MED ORDER — AMPHETAMINE-DEXTROAMPHETAMINE 10 MG PO TABS: 10.0000 mg | ORAL_TABLET | Freq: Three times a day (TID) | ORAL | 0 refills | Status: DC

## 2018-04-16 DIAGNOSIS — M8580 Other specified disorders of bone density and structure, unspecified site: Secondary | ICD-10-CM | POA: Diagnosis not present

## 2018-04-30 ENCOUNTER — Ambulatory Visit (INDEPENDENT_AMBULATORY_CARE_PROVIDER_SITE_OTHER): Payer: Medicare HMO | Admitting: Neurology

## 2018-04-30 ENCOUNTER — Encounter: Payer: Self-pay | Admitting: Neurology

## 2018-04-30 ENCOUNTER — Other Ambulatory Visit: Payer: Self-pay

## 2018-04-30 VITALS — BP 145/92 | HR 78 | Resp 16 | Ht 64.0 in | Wt 210.0 lb

## 2018-04-30 DIAGNOSIS — R5383 Other fatigue: Secondary | ICD-10-CM

## 2018-04-30 DIAGNOSIS — F419 Anxiety disorder, unspecified: Secondary | ICD-10-CM

## 2018-04-30 DIAGNOSIS — Z79899 Other long term (current) drug therapy: Secondary | ICD-10-CM

## 2018-04-30 DIAGNOSIS — G4733 Obstructive sleep apnea (adult) (pediatric): Secondary | ICD-10-CM

## 2018-04-30 DIAGNOSIS — R269 Unspecified abnormalities of gait and mobility: Secondary | ICD-10-CM | POA: Diagnosis not present

## 2018-04-30 DIAGNOSIS — F329 Major depressive disorder, single episode, unspecified: Secondary | ICD-10-CM

## 2018-04-30 DIAGNOSIS — G35 Multiple sclerosis: Secondary | ICD-10-CM | POA: Diagnosis not present

## 2018-04-30 MED ORDER — AMPHETAMINE-DEXTROAMPHETAMINE 20 MG PO TABS: 10.0000 mg | ORAL_TABLET | Freq: Two times a day (BID) | ORAL | 0 refills | Status: DC

## 2018-04-30 NOTE — Progress Notes (Signed)
[facility]  PATIENT: Helen Sandoval DOB: [birth year]  REFERRING DOCTOR OR PCP:  Otto Herb. ([facility])   Sinclair Ship (new PCP at [facility]) SOURCE: patient and records from [facility]  _________________________________   HISTORICAL  CHIEF COMPLAINT:  Chief Complaint  Patient presents with  . Multiple Sclerosis    Sts. she continues to tolerate Tysabri well; c/o more joint aches, feels worse, since infusions were pushed to every 6 wks.  JCV ab last checked 11/09/17 and was positive at 0.61. Recent bone density exam--sts. was told she has osteoporosis, started on Fosamax/fim    HISTORY OF PRESENT ILLNESS:  Helen Sandoval is a 57 yo woman who was diagnosed with MS in 2015.       Update 04/30/2018: She is on Tysabri every 6 weeks (had low positive JCV antibody 0.61).  She tolerates it well and has not had any exacerbations.  However, she felt much better when she took it every 4 weeks experiencing less joint aches and fatigue.   She also has more headaches and more tremors.     Her HA are starting in her neck and they radiate up.       Her gait is doing well.   Occasional stumbles but no falls to the ground.    Bladder is doing well.   Vision is ok.   She has had a little vertigo with mild diplopia at times    She was looking outside and thought she saw squirrels.   She has difficulty focusing while driving.   She is sleepy and will doze off some afternoons and evenings.  Her insomnia is doing well.  She has OSA and uses BiPAP.   For ADD and sleepiness symptoms, she takes Adderall 10 mg po tid.     Update 11/09/2017: She is on Tysabri.  She tolerates the infusions well.   Her last JCV antibody was 04/25/2017.  It was indeterminate at 0.28.  However, the inhibition assay was positive.  This would imply a risk of Tysabri possibly in the 1: 2000 range.  Most of her symptoms are mostly the same.  However, she notes a little more difficulty with her balance  and she has had a couple falls.  One fall hurt her knee and she had fluid drained.   Both falls were forward.   She did not trip.   She hit her head with the one fall but no LOc.   She has some difficulty with vision.  Bladder function is fine.   She has fast low amplitude tremor, worse with intention.  Her mom had a tremor.    Her fatigue has done fairly well.  She is sleeping well with BiPAP.  She has had excellent compliance and efficacy on download.  She denies any significant depression or anxiety.  She notes mild cognitive problems like reduced focus and attention, STM and processing.  Adderall has helped the fatigue and the attentional deficits.   She takes 20-30 mg daily.   She has tramadol but tries to avoid it if she can.     She is also on Wellbutrin and knows not to take more than 2 tramadol's in any 24-hour period.   She has noted some neck pain and also     She notes more sweating  Update 05/11/2017: She feels that her MS has been mostly stable. She continues on Tysabri and tolerates it well. Last JCV antibody showed a titer of 0.28 and the inhib  assay was positive. This is similar to her previous one from last year. She notes that the gait is stable. She did have one fall and hitting a coffee table. She notes slight weakness in the right leg. There is no numbness. Bladder function is fine. She does note that vision is doing worse and she plans on seeing her ophthalmologist.  She feels that her fatigue is doing well. She is on BiPAP and sleeps well most nights. She brought in her chip in the download shows 90% compliance with at greater than 4 hours at night. The AHI was 5.8 the feels that mood is doing well on her current regimen. She doesn't note any significant cognitive problems. Adderall has helped her fatigue and focus and attention. She takes tramadol now and then for her leg pain. She never takes more than 2 in any day. We discussed that the combination of tramadol and Wellbutrin can  increased risk of seizures that she should never take more than 2 tramadol any day.   From 11/09/2016: MS:   She has noted more trouble with her vision but otherwise feels about the same as the last visit. Marland Kitchen   MRI of the spine and brain 05/2016 comparedt to 2016 MRI is essentially unchanged.   The MRI of the cervical spine shows C4-C6 ACDF and has 2-3 small spine spots.   She is on Tysabri. She tolerates it well and has not had any exacerbations on it. Her JCV antibody status was indeterminate at last check (0.33 05/11/2016)   Gait/strength/sensation:   Her gait is stable but she feels off balance at times. She has had a few falls hurting her knee with a recent one. Her right leg feels mildly weaker than the left leg and she drags it when she is tired.   She denies any paresthesias.  Bladder/bowel:     Her urinary urgency and incontinence is doing better with Myrbetriq   No recent bowel incontinence.  Vision:   She feels vision is worse and she notes more diplopia and blurry vision.  This occurs randomly, not just if hot or tired.     . No eye pain.    Fatigue/sleep:   She feels that her fatigue is doing fairly well. Is mild most days but worse with heat and if she has a more active day. The fatigue is both physical and cognitive. She is sleeping fairly well most nights.    Adderall helps fatigue and also slightly helps cognitive issues.    She has OSA and is on BiPAP (Since 2012)   She was told OSA was severe.     She uses BiPAP every night for > 4 hours.        She denies excessive daytime sleepiness but has fatigue.   Her mom noted that shsnored some through her mask.   No recent download but compliance download  11/08/15 shows excellent compliance of 97% and efficacy with AHI only 3.2 on BiPAP 11/6.  Mood/cognition:  Effexor and Wellbutrin helped mood at first but she was more depressed last year and she changed Effexor to [place] is mild.   She notes difficulty with focus and has had some  difficulty with verbal fluency and short term memory.      Adderall only helps a little bit.  MS History:   In 2008, she had headaches and poor balance and was told the MRI had some white matter foci at that time.  She had spinal stenosis and needed surgery in 2014.  The surgeon felt her gait and other issues were decreased out of proportion to the extent of spine disease and referred her to Neurology.   Besides gait issues, she also had double vision, blurry vision, cognitive difficulty and incontinence in early 2015. She saw Dr. Pamalee Leyden in [place]. An MRI of the brain, CSF and visual evoked potentials were consistent with multiple sclerosis.    Initially, she was placed on Tecfidera. Due to progression with more neurologic symptoms, she was switched to Tysabri.   Her last MRI was done 10/2014 but we do not have the images.     REVIEW OF SYSTEMS: Constitutional: No fevers, chills, sweats, or change in appetite.   She notes fatigue Eyes: Reports visual changes and double vision.  No eye pain Ear, nose and throat: No hearing loss, ear pain, nasal congestion, sore throat Cardiovascular: No chest pain, palpitations Respiratory: No shortness of breath at rest or with exertion.   She has snoring and coughing but no wheezes GastrointestinaI: No nausea, vomiting, abdominal pain.  She notes diarrhea and some fecal incontinence Genitourinary: No dysuria, urinary retention or frequency. She has had some incontinence Musculoskeletal: No neck pain, back pain Integumentary: No rash, pruritus, skin lesions Neurological: as above Psychiatric: Some depression at this time.  Some anxiety Endocrine: No palpitations, diaphoresis, change in appetite, change in weigh or increased thirst Hematologic/Lymphatic: No anemia, purpura, petechiae. Allergic/Immunologic: No itchy/runny eyes, nasal congestion, recent allergic reactions, rashes  ALLERGIES: Allergies  Allergen Reactions  . Clindamycin Anaphylaxis  .  Indomethacin Hives  . Levofloxacin Hives  . Penicillins Anaphylaxis  . Penicillins Cross Reactors Anaphylaxis  . Sulfa Antibiotics Swelling    Blister (steven john syndrome)  . Levaquin [Levofloxacin Hemihydrate] Hives  . Lipitor  [Atorvastatin Calcium]   . Sulfamethoxazole-Trimethoprim   . Zocor [Simvastatin] Other (See Comments)    Other reaction(s): MUSCLE PAIN Muscle aches    HOME MEDICATIONS:  Current Outpatient Medications:  .  amphetamine-dextroamphetamine (ADDERALL) 10 MG tablet, Take 1 tablet (10 mg total) by mouth 3 (three) times daily., Disp: 90 tablet, Rfl: 0 .  aspirin EC 81 MG tablet, Take 81 mg by mouth every other day.  , Disp: , Rfl:  .  Biotin 10000 MCG TABS, Take by mouth., Disp: , Rfl:  .  buPROPion (WELLBUTRIN SR) 150 MG 12 hr tablet, Take 150 mg by mouth 2 (two) times daily. , Disp: , Rfl:  .  cholecalciferol (VITAMIN D) 1000 UNITS tablet, Take 5,000 Units by mouth daily.  , Disp: , Rfl:  .  co-enzyme Q-10 30 MG capsule, Take 30 mg by mouth 3 (three) times daily., Disp: , Rfl:  .  cyclobenzaprine (FLEXERIL) 5 MG tablet, TAKE 1 TABLET BY MOUTH THREE TIMES DAILY AS NEEDED, Disp: 90 tablet, Rfl: 5 .  DULERA 200-5 MCG/ACT AERO, , Disp: , Rfl:  .  estradiol (CLIMARA - DOSED IN MG/24 HR) 0.0375 mg/24hr patch, estradiol 0.0375 mg/24 hr weekly transdermal patch, Disp: , Rfl:  .  ezetimibe (ZETIA) 10 MG tablet, Take 10 mg by mouth., Disp: , Rfl:  .  FLUARIX QUADRIVALENT 0.5 ML injection, , Disp: , Rfl:  .  gabapentin (NEURONTIN) 300 MG capsule, TAKE 1 CAPSULE BY MOUTH THREE TIMES DAILY, Disp: 270 capsule, Rfl: 1 .  HYDROcodone-acetaminophen (VICODIN) 5-325 mg TABS tablet, 1 tablet as needed every 8 hours, Disp: 60 tablet, Rfl: 0 .  Krill Oil 1000 MG CAPS, Take 1,000 mg by mouth.,  Disp: , Rfl:  .  MYRBETRIQ 50 MG TB24 tablet, TAKE ONE TABLET BY MOUTH ONCE DAILY, Disp: 90 tablet, Rfl: 3 .  natalizumab (TYSABRI) 300 MG/15ML injection, Inject 300 mg into the vein., Disp: ,  Rfl:  .  omeprazole (PRILOSEC) 20 MG capsule, Take 20 mg by mouth., Disp: , Rfl:  .  traMADol (ULTRAM) 50 MG tablet, Take 1 tablet (50 mg total) by mouth every 12 (twelve) hours as needed., Disp: 60 tablet, Rfl: 3 .  traZODone (DESYREL) 100 MG tablet, TAKE ONE TABLET BY MOUTH AT BEDTIME, Disp: 90 tablet, Rfl: 4 .  TURMERIC PO, Take by mouth., Disp: , Rfl:  .  venlafaxine XR (EFFEXOR XR) 150 MG 24 hr capsule, Take 2 capsules by mouth daily., Disp: , Rfl:   PAST MEDICAL HISTORY: Past Medical History:  Diagnosis Date  . Ankle fracture   . Osteopenia   . Scarlet fever   . Shingles   . Sleep apnea   . Spina bifida   . Vitamin D deficiency     PAST SURGICAL HISTORY: Past Surgical History:  Procedure Laterality Date  . ABDOMINAL HYSTERECTOMY      FAMILY HISTORY: History reviewed. No pertinent family history.  SOCIAL HISTORY:  Social History   Socioeconomic History  . Marital status: Divorced    Spouse name: Not on file  . Number of children: Not on file  . Years of education: Not on file  . Highest education level: Not on file  Occupational History  . Not on file  Social Needs  . Financial resource strain: Not on file  . Food insecurity:    Worry: Not on file    Inability: Not on file  . Transportation needs:    Medical: Not on file    Non-medical: Not on file  Tobacco Use  . Smoking status: Current Some Day Smoker  . Smokeless tobacco: Never Used  Substance and Sexual Activity  . Alcohol use: No  . Drug use: No  . Sexual activity: Not on file  Lifestyle  . Physical activity:    Days per week: Not on file    Minutes per session: Not on file  . Stress: Not on file  Relationships  . Social connections:    Talks on phone: Not on file    Gets together: Not on file    Attends religious service: Not on file    Active member of club or organization: Not on file    Attends meetings of clubs or organizations: Not on file    Relationship status: Not on file  .  Intimate partner violence:    Fear of current or ex partner: Not on file    Emotionally abused: Not on file    Physically abused: Not on file    Forced sexual activity: Not on file  Other Topics Concern  . Not on file  Social History Narrative  . Not on file     PHYSICAL EXAM  Vitals:   04/30/18 1304  BP: (!) 145/92  Pulse: 78  Resp: 16  Weight: 210 lb (95.3 kg)  Height: 5\' 4"  (1.626 m)    Body mass index is 36.05 kg/m.   General: The patient is well-developed and well-nourished and in no acute distress  Neck: The neck is supple with good ROM and nontender.      Neurologic Exam  Mental status: The patient is alert and oriented x 3 at the time of the examination. The patient has apparent  normal recent and remote memory, with an apparently normal attention span and concentration ability.   Speech is normal.  Cranial nerves: Extraocular movements are full.  There is mild reduced sensation in the right face.  Facial strength is normal.  Trapezius strength is normal.  No dysarthria is noted.  No obvious hearing deficits are noted.  Motor:  Muscle bulk is normal.   Tone is normal. Strength is  5 / 5 in all 4 extremities.   Sensory: She has normal touch and vibration sensation in the arms or legs..  Coordination: Cerebellar testing reveals good finger-nose-finger and reduced heel-to-shin bilaterally.  Gait and station: Station is normal.  The gait is mildly wide.  Tandem gait is moderately wide..  The Romberg is negative.  Reflexes: Deep tendon reflexes are symmetric and 2+ arms and 3+ knees with spread.   No ankle clonus.    DIAGNOSTIC DATA (LABS, IMAGING, TESTING) - I reviewed patient records, labs, notes, testing and imaging myself where available.       ASSESSMENT AND PLAN   Multiple sclerosis (HCC)  OSA treated with BiPAP  Anxiety and depression  Gait disturbance  Other fatigue  High risk medication use   1.   She will continue Tysabri 300 mg  every 6 weeks. I will check his JCV antibody today.   Also check CBC.   We will check an MRI of the brain and cervical spine to determine if she is having any subclinical activity.  If present, consider a switch to different disease modifying therapy. 2.   Continue other medications for MS symptoms 3.   Continue BiPAP.  She uses it nightly. 4.   Return in 6 months or sooner if there are new or worsening neurologic symptoms.   Richard A. Felecia Shelling, MD, PhD 98/06/6413, 8:30 PM Certified in Neurology, Clinical Neurophysiology, Sleep Medicine, Pain Medicine and Neuroimaging . [facility] [address], [place], [place] [address] [phone]

## 2018-05-01 ENCOUNTER — Telehealth: Payer: Self-pay | Admitting: Neurology

## 2018-05-01 LAB — CBC WITH DIFFERENTIAL/PLATELET
BASOS ABS: 0.1 10*3/uL (ref 0.0–0.2)
Basos: 1 %
EOS (ABSOLUTE): 0.1 10*3/uL (ref 0.0–0.4)
Eos: 2 %
Hematocrit: 39.7 % (ref 34.0–46.6)
Hemoglobin: 13.1 g/dL (ref 11.1–15.9)
Immature Grans (Abs): 0.1 10*3/uL (ref 0.0–0.1)
Immature Granulocytes: 1 %
Lymphocytes Absolute: 2.3 10*3/uL (ref 0.7–3.1)
Lymphs: 31 %
MCH: 29.6 pg (ref 26.6–33.0)
MCHC: 33 g/dL (ref 31.5–35.7)
MCV: 90 fL (ref 79–97)
Monocytes Absolute: 0.5 10*3/uL (ref 0.1–0.9)
Monocytes: 7 %
NEUTROS PCT: 58 %
NRBC: 1 % — ABNORMAL HIGH (ref 0–0)
Neutrophils Absolute: 4.3 10*3/uL (ref 1.4–7.0)
Platelets: 168 10*3/uL (ref 150–450)
RBC: 4.42 x10E6/uL (ref 3.77–5.28)
RDW: 14.4 % (ref 12.3–15.4)
WBC: 7.4 10*3/uL (ref 3.4–10.8)

## 2018-05-01 NOTE — Telephone Encounter (Signed)
Helen Sandoval: [id number] (exp. 05/01/18 to 05/31/18) order sent to GI pt is aware. I informed her if she has not heard from them in the next 2-3 business days to give them a call and she has their number of [phone].

## 2018-05-02 MED ORDER — AMPHETAMINE-DEXTROAMPHETAMINE 20 MG PO TABS: 10.0000 mg | ORAL_TABLET | Freq: Two times a day (BID) | ORAL | 0 refills | Status: DC

## 2018-05-07 ENCOUNTER — Encounter: Payer: Self-pay | Admitting: *Deleted

## 2018-05-07 ENCOUNTER — Other Ambulatory Visit: Payer: Self-pay | Admitting: Neurology

## 2018-05-08 ENCOUNTER — Telehealth: Payer: Self-pay | Admitting: *Deleted

## 2018-05-08 NOTE — Telephone Encounter (Signed)
JCV lab drawn on 04/30/2018 positive. Titer: 2.32. Given to Dr. Felecia Shelling to review.

## 2018-05-10 DIAGNOSIS — G35 Multiple sclerosis: Secondary | ICD-10-CM | POA: Diagnosis not present

## 2018-05-11 ENCOUNTER — Telehealth: Payer: Self-pay | Admitting: Neurology

## 2018-05-11 NOTE — Telephone Encounter (Signed)
I spoke with Helen Sandoval about the JCV antibody test increasing from low positive to high positive.  Therefore, I feel it would be in her best interest to switch off of Tysabri onto another medication.  I discussed the following for medications as strong medications with less likelihood of PML: [name], [name], [name].  I went over some of the pros and cons of each and she is going to give this more thought.  I will set up an appointment to see me on January 6 in the afternoon.  [name], Please set up an appointment for January 6 in the late afternoon

## 2018-05-12 ENCOUNTER — Ambulatory Visit
Admission: RE | Admit: 2018-05-12 | Discharge: 2018-05-12 | Disposition: A | Discharge status: Home / Self Care | Payer: Medicare HMO | Source: Ambulatory Visit | Attending: Neurology | Admitting: Neurology

## 2018-05-12 ENCOUNTER — Ambulatory Visit
Admission: RE | Admit: 2018-05-12 | Discharge: 2018-05-12 | Disposition: A | Discharge status: Home / Self Care | Payer: 59 | Source: Ambulatory Visit | Attending: Neurology | Admitting: Neurology

## 2018-05-12 DIAGNOSIS — G35 Multiple sclerosis: Secondary | ICD-10-CM | POA: Diagnosis not present

## 2018-05-12 MED ORDER — GADOBENATE DIMEGLUMINE 529 MG/ML IV SOLN
19.0000 mL | Freq: Once | INTRAVENOUS | Status: AC | PRN
  Administered 2018-05-12: 19 mL via INTRAVENOUS

## 2018-05-14 ENCOUNTER — Telehealth: Payer: Self-pay | Admitting: *Deleted

## 2018-05-14 NOTE — Telephone Encounter (Signed)
Pt returning RNs call, scheduled her for 05/28/18 at 9am as noted

## 2018-05-14 NOTE — Telephone Encounter (Signed)
Noted  

## 2018-05-14 NOTE — Telephone Encounter (Addendum)
Tried calling pt. Niece picked up but not on DPR. She will have pt call back to set up appt. Can offer 05/28/18 at 9am if she calls back to schedule appt

## 2018-05-14 NOTE — Telephone Encounter (Signed)
Called and spoke with pt about MRI results per Dr. Felecia Shelling note. She verbalized understanding and appreciation for call.

## 2018-05-14 NOTE — Telephone Encounter (Signed)
-----   Message from Britt Bottom, MD sent at 05/14/2018  3:30 PM EST ----- Please let her know that the MRIs of the brain and cervical spine did not show any new lesions.

## 2018-05-28 ENCOUNTER — Encounter: Payer: Self-pay | Admitting: Neurology

## 2018-05-28 ENCOUNTER — Ambulatory Visit: Payer: Medicare HMO | Admitting: Neurology

## 2018-05-28 VITALS — BP 137/86 | HR 91 | Ht 64.0 in | Wt 214.5 lb

## 2018-05-28 DIAGNOSIS — Z79899 Other long term (current) drug therapy: Secondary | ICD-10-CM

## 2018-05-28 DIAGNOSIS — G35 Multiple sclerosis: Secondary | ICD-10-CM | POA: Diagnosis not present

## 2018-05-28 DIAGNOSIS — M5030 Other cervical disc degeneration, unspecified cervical region: Secondary | ICD-10-CM | POA: Diagnosis not present

## 2018-05-28 DIAGNOSIS — J42 Unspecified chronic bronchitis: Secondary | ICD-10-CM | POA: Diagnosis not present

## 2018-05-28 MED ORDER — AZITHROMYCIN 250 MG PO TABS: ORAL_TABLET | ORAL | 0 refills | Status: DC

## 2018-05-28 NOTE — Patient Instructions (Signed)
100 mg three times a day Biotin    [email or web address]

## 2018-05-28 NOTE — Progress Notes (Signed)
[facility]  PATIENT: Helen Sandoval DOB: [birth year]  REFERRING DOCTOR OR PCP:  Otto Herb. ([facility])   Sinclair Ship (new PCP at [facility]) SOURCE: patient and records from [facility]  _________________________________   HISTORICAL  CHIEF COMPLAINT:  Chief Complaint  Patient presents with  . Follow-up    RM 12,alone. Last seen 04/30/2018. PCP on vacation and she has sinus infection/bronchitis. Was given a round of antibiotics but she is wanting another round. Still feeling sick.  . Multiple Sclerosis    Here to discuss medication options.    HISTORY OF PRESENT ILLNESS:  Helen Sandoval is a 58 yo woman who was diagnosed with MS in 2015.       Update1/10/2018 Her MS has been stable.  Unfortunately, her risk of PML has increased.  Her JCV Ab titer increased to 2.32 and I discussed changes in therapy.    She is here today to discuss in more detail.   Her last Tysabri was late December.  She had MRI of the brain and spine performed towards the end of December.  I personally reviewed the images with her today.  The MRI of the brain shows multiple T2/flair hyperintense foci in a stable pattern consistent with chronic demyelination (probably with chronic microvascular ischemic change mixed in).  MRI of the cervical spine did not show any new lesions in the spine.  She does have some degenerative changes above and below her fusion.  She continues to report some pain going down the left arm.  Since September, she reports a sinus infection.  She was treated with doxycycline and a Z-pak.   She is taking guafinescan and trying to reduce smoking (now 1/4 ppd).   She has Dulera and ventolin.   She was told a CXR was normal.  She has not had PFT's.    MRI was reviewed and no definite sinusitis noted.  We had a long conversation about different disease modifying therapies for her MS. She was interested in [place], [place] or [place].   Because she appears to have  frequent respiratory infections, Ocrevus may have more risk for her.  We went into details about Lemtrada and [place] and she is potentially interested in these and will think about this more over the next week while we are waiting for blood work to return.  Update 04/30/2018: She is on Tysabri every 6 weeks (had low positive JCV antibody 0.61).  She tolerates it well and has not had any exacerbations.  However, she felt much better when she took it every 4 weeks experiencing less joint aches and fatigue.   She also has more headaches and more tremors.     Her HA are starting in her neck and they radiate up.       Her gait is doing well.   Occasional stumbles but no falls to the ground.    Bladder is doing well.   Vision is ok.   She has had a little vertigo with mild diplopia at times    She was looking outside and thought she saw squirrels.   She has difficulty focusing while driving.   She is sleepy and will doze off some afternoons and evenings.  Her insomnia is doing well.  She has OSA and uses BiPAP.   For ADD and sleepiness symptoms, she takes Adderall 10 mg po tid.     Update 11/09/2017: She is on Tysabri.  She tolerates the infusions well.   Her last JCV  antibody was 04/25/2017.  It was indeterminate at 0.28.  However, the inhibition assay was positive.  This would imply a risk of Tysabri possibly in the 1: 2000 range.  Most of her symptoms are mostly the same.  However, she notes a little more difficulty with her balance and she has had a couple falls.  One fall hurt her knee and she had fluid drained.   Both falls were forward.   She did not trip.   She hit her head with the one fall but no LOc.   She has some difficulty with vision.  Bladder function is fine.   She has fast low amplitude tremor, worse with intention.  Her mom had a tremor.    Her fatigue has done fairly well.  She is sleeping well with BiPAP.  She has had excellent compliance and efficacy on download.  She denies any  significant depression or anxiety.  She notes mild cognitive problems like reduced focus and attention, STM and processing.  Adderall has helped the fatigue and the attentional deficits.   She takes 20-30 mg daily.   She has tramadol but tries to avoid it if she can.     She is also on Wellbutrin and knows not to take more than 2 tramadol's in any 24-hour period.   She has noted some neck pain and also     She notes more sweating  Update 05/11/2017: She feels that her MS has been mostly stable. She continues on Tysabri and tolerates it well. Last JCV antibody showed a titer of 0.28 and the inhib assay was positive. This is similar to her previous one from last year. She notes that the gait is stable. She did have one fall and hitting a coffee table. She notes slight weakness in the right leg. There is no numbness. Bladder function is fine. She does note that vision is doing worse and she plans on seeing her ophthalmologist.  She feels that her fatigue is doing well. She is on BiPAP and sleeps well most nights. She brought in her chip in the download shows 90% compliance with at greater than 4 hours at night. The AHI was 5.8 the feels that mood is doing well on her current regimen. She doesn't note any significant cognitive problems. Adderall has helped her fatigue and focus and attention. She takes tramadol now and then for her leg pain. She never takes more than 2 in any day. We discussed that the combination of tramadol and Wellbutrin can increased risk of seizures that she should never take more than 2 tramadol any day.   From 11/09/2016: MS:   She has noted more trouble with her vision but otherwise feels about the same as the last visit. Marland Kitchen   MRI of the spine and brain 05/2016 comparedt to 2016 MRI is essentially unchanged.   The MRI of the cervical spine shows C4-C6 ACDF and has 2-3 small spine spots.   She is on Tysabri. She tolerates it well and has not had any exacerbations on it. Her JCV antibody  status was indeterminate at last check (0.33 05/11/2016)   Gait/strength/sensation:   Her gait is stable but she feels off balance at times. She has had a few falls hurting her knee with a recent one. Her right leg feels mildly weaker than the left leg and she drags it when she is tired.   She denies any paresthesias.  Bladder/bowel:     Her urinary urgency and incontinence is  doing better with Myrbetriq   No recent bowel incontinence.  Vision:   She feels vision is worse and she notes more diplopia and blurry vision.  This occurs randomly, not just if hot or tired.     . No eye pain.    Fatigue/sleep:   She feels that her fatigue is doing fairly well. Is mild most days but worse with heat and if she has a more active day. The fatigue is both physical and cognitive. She is sleeping fairly well most nights.    Adderall helps fatigue and also slightly helps cognitive issues.    She has OSA and is on BiPAP (Since 2012)   She was told OSA was severe.     She uses BiPAP every night for > 4 hours.        She denies excessive daytime sleepiness but has fatigue.   Her mom noted that shsnored some through her mask.   No recent download but compliance download  11/08/15 shows excellent compliance of 97% and efficacy with AHI only 3.2 on BiPAP 11/6.  Mood/cognition:  Effexor and Wellbutrin helped mood at first but she was more depressed last year and she changed Effexor to [place] is mild.   She notes difficulty with focus and has had some difficulty with verbal fluency and short term memory.      Adderall only helps a little bit.  MS History:   In 2008, she had headaches and poor balance and was told the MRI had some white matter foci at that time.    She had spinal stenosis and needed surgery in 2014.  The surgeon felt her gait and other issues were decreased out of proportion to the extent of spine disease and referred her to Neurology.   Besides gait issues, she also had double vision, blurry vision,  cognitive difficulty and incontinence in early 2015. She saw Dr. Pamalee Leyden in [place]. An MRI of the brain, CSF and visual evoked potentials were consistent with multiple sclerosis.    Initially, she was placed on Tecfidera. Due to progression with more neurologic symptoms, she was switched to Tysabri.   Her last MRI was done 10/2014 but we do not have the images.     REVIEW OF SYSTEMS: Constitutional: No fevers, chills, sweats, or change in appetite.   She notes fatigue Eyes: Reports visual changes and double vision.  No eye pain Ear, nose and throat: No hearing loss, ear pain, nasal congestion, sore throat Cardiovascular: No chest pain, palpitations Respiratory: No shortness of breath at rest or with exertion.   She has snoring and coughing but no wheezes GastrointestinaI: No nausea, vomiting, abdominal pain.  She notes diarrhea and some fecal incontinence Genitourinary: No dysuria, urinary retention or frequency. She has had some incontinence Musculoskeletal: No neck pain, back pain Integumentary: No rash, pruritus, skin lesions Neurological: as above Psychiatric: Some depression at this time.  Some anxiety Endocrine: No palpitations, diaphoresis, change in appetite, change in weigh or increased thirst Hematologic/Lymphatic: No anemia, purpura, petechiae. Allergic/Immunologic: No itchy/runny eyes, nasal congestion, recent allergic reactions, rashes  ALLERGIES: Allergies  Allergen Reactions  . Clindamycin Anaphylaxis  . Indomethacin Hives  . Levofloxacin Hives  . Penicillins Anaphylaxis  . Penicillins Cross Reactors Anaphylaxis  . Sulfa Antibiotics Swelling    Blister (steven john syndrome)  . Levaquin [Levofloxacin Hemihydrate] Hives  . Lipitor  [Atorvastatin Calcium]   . Sulfamethoxazole-Trimethoprim   . Zocor [Simvastatin] Other (See Comments)    Other  reaction(s): MUSCLE PAIN Muscle aches    HOME MEDICATIONS:  Current Outpatient Medications:  .  alendronate (FOSAMAX)  70 MG tablet, Take 70 mg by mouth once a week. Take with a full glass of water on an empty stomach., Disp: , Rfl:  .  amphetamine-dextroamphetamine (ADDERALL) 20 MG tablet, Take 0.5 tablets (10 mg total) by mouth 2 (two) times daily with a meal. (Patient taking differently: Take 20 mg by mouth 2 (two) times daily. ), Disp: 60 tablet, Rfl: 0 .  aspirin EC 81 MG tablet, Take 81 mg by mouth every other day.  , Disp: , Rfl:  .  Biotin 10000 MCG TABS, Take by mouth., Disp: , Rfl:  .  buPROPion (WELLBUTRIN SR) 150 MG 12 hr tablet, Take 150 mg by mouth 2 (two) times daily. , Disp: , Rfl:  .  cholecalciferol (VITAMIN D) 1000 UNITS tablet, Take 5,000 Units by mouth daily.  , Disp: , Rfl:  .  co-enzyme Q-10 30 MG capsule, Take 30 mg by mouth daily. , Disp: , Rfl:  .  cyclobenzaprine (FLEXERIL) 5 MG tablet, TAKE 1 TABLET BY MOUTH THREE TIMES DAILY AS NEEDED, Disp: 90 tablet, Rfl: 5 .  DULERA 200-5 MCG/ACT AERO, , Disp: , Rfl:  .  estradiol (CLIMARA - DOSED IN MG/24 HR) 0.0375 mg/24hr patch, estradiol 0.0375 mg/24 hr weekly transdermal patch, Disp: , Rfl:  .  ezetimibe (ZETIA) 10 MG tablet, Take 10 mg by mouth., Disp: , Rfl:  .  FLUARIX QUADRIVALENT 0.5 ML injection, Received on 02/14/2018, Disp: , Rfl:  .  gabapentin (NEURONTIN) 300 MG capsule, TAKE 1 CAPSULE BY MOUTH THREE TIMES DAILY, Disp: 270 capsule, Rfl: 1 .  Krill Oil 1000 MG CAPS, Take 1,000 mg by mouth., Disp: , Rfl:  .  MYRBETRIQ 50 MG TB24 tablet, TAKE 1 TABLET BY MOUTH ONCE DAILY, Disp: 90 tablet, Rfl: 3 .  natalizumab (TYSABRI) 300 MG/15ML injection, Inject 300 mg into the vein., Disp: , Rfl:  .  omeprazole (PRILOSEC) 20 MG capsule, Take 20 mg by mouth., Disp: , Rfl:  .  traZODone (DESYREL) 100 MG tablet, TAKE ONE TABLET BY MOUTH AT BEDTIME, Disp: 90 tablet, Rfl: 4 .  TURMERIC PO, Take by mouth., Disp: , Rfl:  .  venlafaxine XR (EFFEXOR XR) 150 MG 24 hr capsule, Take 2 capsules by mouth daily., Disp: , Rfl:  .  azithromycin (ZITHROMAX Z-PAK) 250  MG tablet, Take 2 pills po x 1 day then one pill daily until complete, Disp: 6 each, Rfl: 0  PAST MEDICAL HISTORY: Past Medical History:  Diagnosis Date  . Ankle fracture   . Osteopenia   . Scarlet fever   . Shingles   . Sleep apnea   . Spina bifida   . Vitamin D deficiency     PAST SURGICAL HISTORY: Past Surgical History:  Procedure Laterality Date  . ABDOMINAL HYSTERECTOMY      FAMILY HISTORY: History reviewed. No pertinent family history.  SOCIAL HISTORY:  Social History   Socioeconomic History  . Marital status: Divorced    Spouse name: Not on file  . Number of children: Not on file  . Years of education: Not on file  . Highest education level: Not on file  Occupational History  . Not on file  Social Needs  . Financial resource strain: Not on file  . Food insecurity:    Worry: Not on file    Inability: Not on file  . Transportation needs:    Medical:  Not on file    Non-medical: Not on file  Tobacco Use  . Smoking status: Current Some Day Smoker  . Smokeless tobacco: Never Used  Substance and Sexual Activity  . Alcohol use: No  . Drug use: No  . Sexual activity: Not on file  Lifestyle  . Physical activity:    Days per week: Not on file    Minutes per session: Not on file  . Stress: Not on file  Relationships  . Social connections:    Talks on phone: Not on file    Gets together: Not on file    Attends religious service: Not on file    Active member of club or organization: Not on file    Attends meetings of clubs or organizations: Not on file    Relationship status: Not on file  . Intimate partner violence:    Fear of current or ex partner: Not on file    Emotionally abused: Not on file    Physically abused: Not on file    Forced sexual activity: Not on file  Other Topics Concern  . Not on file  Social History Narrative  . Not on file     PHYSICAL EXAM  Vitals:   05/28/18 0855  BP: 137/86  Pulse: 91  Weight: 214 lb 8 oz (97.3 kg)    Height: 5\' 4"  (1.626 m)    Body mass index is 36.82 kg/m.   General: The patient is well-developed and well-nourished and in no acute distress  Neck: The neck is supple with good ROM and nontender.      Neurologic Exam  Mental status: The patient is alert and oriented x 3 at the time of the examination. The patient has apparent normal recent and remote memory, with an apparently normal attention span and concentration ability.   Speech is normal.  Cranial nerves: Extraocular movements appear normal.  Facial strength was normal.  Trapezius strength is normal.  No dysarthria is noted.  No obvious hearing deficits are noted.  Motor:  Muscle bulk is normal.   Tone is normal. Strength is  5 / 5 in all 4 extremities.   Sensory: She has normal touch and vibration sensation in the arms or legs..  Coordination: Cerebellar testing reveals good finger-nose-finger  Gait and station: Station is normal.  The gait is mildly wide.  Tandem gait is moderately wide..     Reflexes: Deep tendon reflexes are symmetric and 2+ arms and 3+ knees with spread.   No ankle clonus.    DIAGNOSTIC DATA (LABS, IMAGING, TESTING) - I reviewed patient records, labs, notes, testing and imaging myself where available.       ASSESSMENT AND PLAN   Multiple sclerosis ([place]) - Plan: Hepatitis B core antibody, total, Hepatitis B surface antigen, HIV Antibody (routine testing w rflx), QuantiFERON-TB Gold Plus, Hepatitis B surface antibody,qualitative, Hepatitis C antibody, TSH, Comprehensive metabolic panel, CBC with Differential/Platelet  High risk medication use - Plan: Hepatitis B core antibody, total, Hepatitis B surface antigen, HIV Antibody (routine testing w rflx), QuantiFERON-TB Gold Plus, Hepatitis B surface antibody,qualitative, Hepatitis C antibody, TSH, Comprehensive metabolic panel, CBC with Differential/Platelet  Degeneration of intervertebral disc of cervical region  Chronic bronchitis, unspecified  chronic bronchitis type ([place])     1.   She was stopped Tysabri.  We will try to get her started on either Mavenclad or Lemtrada based on her lab results and her treatment decisions.   2.   Continue other medications for  MS symptoms.  Z-Pak for bronchitis.  Also advised to take guaifenesin and her inhaled steroids.  Also advised to quit smoking.  If symptoms do not improve she should report back to her primary care physician or see a pulmonologist.   She might benefit from pulmonary function test to see if she has COPD. 3.   Continue BiPAP.  She uses it nightly. 4.   Return in 3 months or sooner if there are new or worsening neurologic symptoms.   Richard A. Felecia Shelling, MD, PhD 0/11/1217, 75:88 AM Certified in Neurology, Clinical Neurophysiology, Sleep Medicine, Pain Medicine and Neuroimaging . [facility] [address], [place], [place] [address] [phone]

## 2018-05-31 LAB — CBC WITH DIFFERENTIAL/PLATELET
Basophils Absolute: 0.1 10*3/uL (ref 0.0–0.2)
Basos: 1 %
EOS (ABSOLUTE): 0.2 10*3/uL (ref 0.0–0.4)
Eos: 2 %
Hematocrit: 38.1 % (ref 34.0–46.6)
Hemoglobin: 13.1 g/dL (ref 11.1–15.9)
IMMATURE GRANS (ABS): 0.1 10*3/uL (ref 0.0–0.1)
Immature Granulocytes: 1 %
Lymphocytes Absolute: 2 10*3/uL (ref 0.7–3.1)
Lymphs: 28 %
MCH: 31.2 pg (ref 26.6–33.0)
MCHC: 34.4 g/dL (ref 31.5–35.7)
MCV: 91 fL (ref 79–97)
Monocytes Absolute: 0.6 10*3/uL (ref 0.1–0.9)
Monocytes: 8 %
NRBC: 1 % — ABNORMAL HIGH (ref 0–0)
Neutrophils Absolute: 4.3 10*3/uL (ref 1.4–7.0)
Neutrophils: 60 %
Platelets: 169 10*3/uL (ref 150–450)
RBC: 4.2 x10E6/uL (ref 3.77–5.28)
RDW: 14.6 % (ref 11.7–15.4)
WBC: 7.2 10*3/uL (ref 3.4–10.8)

## 2018-05-31 LAB — COMPREHENSIVE METABOLIC PANEL
A/G RATIO: 2.8 — AB (ref 1.2–2.2)
ALT: 38 IU/L — AB (ref 0–32)
AST: 31 IU/L (ref 0–40)
Albumin: 4.5 g/dL (ref 3.5–5.5)
Alkaline Phosphatase: 92 IU/L (ref 39–117)
BUN/Creatinine Ratio: 12 (ref 9–23)
BUN: 11 mg/dL (ref 6–24)
Bilirubin Total: 0.4 mg/dL (ref 0.0–1.2)
CO2: 23 mmol/L (ref 20–29)
Calcium: 9.4 mg/dL (ref 8.7–10.2)
Chloride: 100 mmol/L (ref 96–106)
Creatinine, Ser: 0.93 mg/dL (ref 0.57–1.00)
GFR calc Af Amer: 78 mL/min/{1.73_m2} (ref >59)
GFR calc non Af Amer: 67 mL/min/{1.73_m2} (ref >59)
Globulin, Total: 1.6 g/dL (ref 1.5–4.5)
Glucose: 128 mg/dL — ABNORMAL HIGH (ref 65–99)
Potassium: 4.6 mmol/L (ref 3.5–5.2)
Sodium: 140 mmol/L (ref 134–144)
Total Protein: 6.1 g/dL (ref 6.0–8.5)

## 2018-05-31 LAB — QUANTIFERON-TB GOLD PLUS
QUANTIFERON TB1 AG VALUE: 0.04 [IU]/mL
QuantiFERON Mitogen Value: >10 IU/mL
QuantiFERON Nil Value: 0.04 IU/mL
QuantiFERON TB2 Ag Value: 0.04 IU/mL
QuantiFERON-TB Gold Plus: NEGATIVE

## 2018-05-31 LAB — HEPATITIS B CORE ANTIBODY, TOTAL: Hep B Core Total Ab: NEGATIVE

## 2018-05-31 LAB — HEPATITIS B SURFACE ANTIBODY,QUALITATIVE: Hep B Surface Ab, Qual: NONREACTIVE

## 2018-05-31 LAB — TSH: TSH: 3.1 u[IU]/mL (ref 0.450–4.500)

## 2018-05-31 LAB — HEPATITIS C ANTIBODY: Hep C Virus Ab: <0.1 s/co ratio (ref 0.0–0.9)

## 2018-05-31 LAB — HIV ANTIBODY (ROUTINE TESTING W REFLEX): HIV Screen 4th Generation wRfx: NONREACTIVE

## 2018-05-31 LAB — HEPATITIS B SURFACE ANTIGEN: Hepatitis B Surface Ag: NEGATIVE

## 2018-06-03 ENCOUNTER — Other Ambulatory Visit: Payer: Self-pay | Admitting: Neurology

## 2018-06-08 ENCOUNTER — Telehealth: Payer: Self-pay | Admitting: Neurology

## 2018-06-08 NOTE — Telephone Encounter (Signed)
I called Helen Sandoval but got her voicemail.  Lab work would be okay for her to go on either [place] or [place].  She was going to give this more thought when we discussed at the last visit.  I will call again on Monday.

## 2018-06-12 NOTE — Telephone Encounter (Addendum)
Faxed completed/signed Mavenclad start form to [place] at [phone]. Received fax confirmation.  Faxed back form to state pt can start [place]. Fax: [phone]. Received fax confirmation.  Spoke with Arthur Holms with research ([employer]) and she is going to call pt to go over process of getting her started on medication. She will have medication shipped to her home and then will bring with her to appt here at [facility] with research to take her first dose here.

## 2018-06-18 ENCOUNTER — Telehealth: Payer: Self-pay | Admitting: *Deleted

## 2018-06-18 NOTE — Telephone Encounter (Signed)
Initiated PA Mavenclad 10mg  tab on covermymeds.JHH:[id number] - PA Case ID: [id number]. In process of completing.

## 2018-06-19 NOTE — Telephone Encounter (Signed)
Received fax notification from [facility] that [place] approved.  PA Case: [id number]. Effective 06/19/2018-05/23/2019. Questions? Contact [phone].

## 2018-06-19 NOTE — Telephone Encounter (Signed)
PA submitted. Waiting on determination.  Pt has tried/failed: Tecfidera (has disease progression), Tysabri (JCV ab positive, high titer and had to switch DMT), unable to self inject, [place] not appropriate d/t increased risk for infection per Dr. Felecia Shelling.   "If [employer] has not replied to your request within 24-72 hours please contact [employer] at [phone]."

## 2018-06-21 ENCOUNTER — Telehealth: Payer: Self-pay | Admitting: Neurology

## 2018-06-21 ENCOUNTER — Other Ambulatory Visit: Payer: Self-pay | Admitting: Neurology

## 2018-06-21 MED ORDER — AMPHETAMINE-DEXTROAMPHETAMINE 20 MG PO TABS: ORAL_TABLET | ORAL | 0 refills | Status: DC

## 2018-06-21 NOTE — Addendum Note (Signed)
Addended by: Hope Pigeon on: 06/21/2018 02:02 PM   Modules accepted: Orders

## 2018-06-21 NOTE — Telephone Encounter (Signed)
Pt has called for a refill on her amphetamine-dextroamphetamine (ADDERALL) 20 MG tablet [employer]  [phone]

## 2018-06-21 NOTE — Telephone Encounter (Signed)
pt has called for the intrafusion suite, call transferred °

## 2018-06-26 ENCOUNTER — Other Ambulatory Visit: Payer: Self-pay

## 2018-07-03 ENCOUNTER — Telehealth: Payer: Self-pay | Admitting: *Deleted

## 2018-07-03 NOTE — Telephone Encounter (Signed)
Received fax notification from [place] that pt scheduled delivery of Mavenclad tabs for her to receive on 07/03/18.

## 2018-07-05 ENCOUNTER — Encounter: Payer: Self-pay | Admitting: Neurology

## 2018-07-05 ENCOUNTER — Ambulatory Visit: Payer: Medicare HMO | Admitting: Neurology

## 2018-07-05 VITALS — BP 132/87 | HR 98 | Ht 64.0 in | Wt 212.0 lb

## 2018-07-05 DIAGNOSIS — N3941 Urge incontinence: Secondary | ICD-10-CM

## 2018-07-05 DIAGNOSIS — R269 Unspecified abnormalities of gait and mobility: Secondary | ICD-10-CM

## 2018-07-05 DIAGNOSIS — R5383 Other fatigue: Secondary | ICD-10-CM

## 2018-07-05 DIAGNOSIS — G4733 Obstructive sleep apnea (adult) (pediatric): Secondary | ICD-10-CM

## 2018-07-05 DIAGNOSIS — Z79899 Other long term (current) drug therapy: Secondary | ICD-10-CM

## 2018-07-05 DIAGNOSIS — G35D Multiple sclerosis, unspecified: Secondary | ICD-10-CM

## 2018-07-05 DIAGNOSIS — G35 Multiple sclerosis: Secondary | ICD-10-CM | POA: Diagnosis not present

## 2018-07-05 NOTE — Progress Notes (Signed)
[facility]  PATIENT: Helen Sandoval DOB: [birth year]  REFERRING DOCTOR OR PCP:  Otto Herb. ([facility])   Sinclair Ship (new PCP at [facility]) SOURCE: patient and records from [facility]  _________________________________   HISTORICAL  CHIEF COMPLAINT:  Chief Complaint  Patient presents with  . Multiple Sclerosis    rm 12, "stopped Tysabri, will begin [place], no new issues"    HISTORY OF PRESENT ILLNESS:  Helen Sandoval is a 58 yo woman who was diagnosed with MS in 2015.     Update 07/05/2018: She feels she is doing about the same as she was at her last visit with no new MS exacerbations.  She does feel more fatigued.  Her gait does fairly well and she is able to walk a mile and keep up with her friends.  She denies significant weakness or spasticity.  She does note some numbness at times on the left.  She has reduced color vision out of the left eye and mildly more blurry vision out of that eye.  She has mild bladder symptoms that are well controlled by medications.  Her main problems are fatigue and cognitive/attention.  She has had some difficulty with verbal fluency and with executive function such as following recipes.   Today, she will be starting [place] and her drug is been shipped to her.  She will be followed in the MASTER study.   Update1/10/2018 Her MS has been stable.  Unfortunately, her risk of PML has increased.  Her JCV Ab titer increased to 2.32 and I discussed changes in therapy.    She is here today to discuss in more detail.   Her last Tysabri was late December.  She had MRI of the brain and spine performed towards the end of December.  I personally reviewed the images with her today.  The MRI of the brain shows multiple T2/flair hyperintense foci in a stable pattern consistent with chronic demyelination (probably with chronic microvascular ischemic change mixed in).  MRI of the cervical spine did not show any new lesions in the  spine.  She does have some degenerative changes above and below her fusion.  She continues to report some pain going down the left arm.  Since September, she reports a sinus infection.  She was treated with doxycycline and a Z-pak.   She is taking guafinescan and trying to reduce smoking (now 1/4 ppd).   She has Dulera and ventolin.   She was told a CXR was normal.  She has not had PFT's.    MRI was reviewed and no definite sinusitis noted.  We had a long conversation about different disease modifying therapies for her MS. She was interested in [place], [place] or [place].   Because she appears to have frequent respiratory infections, O[place]vus may have more risk for her.  We went into details about [place] and [place] and she is potentially interested in these and will think about this more over the next week while we are waiting for blood work to return.  Update 04/30/2018: She is on Tysabri every 6 weeks (had low positive JCV antibody 0.61).  She tolerates it well and has not had any exacerbations.  However, she felt much better when she took it every 4 weeks experiencing less joint aches and fatigue.   She also has more headaches and more tremors.     Her HA are starting in her neck and they radiate up.       Her gait  is doing well.   Occasional stumbles but no falls to the ground.    Bladder is doing well.   Vision is ok.   She has had a little vertigo with mild diplopia at times    She was looking outside and thought she saw squirrels.   She has difficulty focusing while driving.   She is sleepy and will doze off some afternoons and evenings.  Her insomnia is doing well.  She has OSA and uses BiPAP.   For ADD and sleepiness symptoms, she takes Adderall 10 mg po tid.     Update 11/09/2017: She is on Tysabri.  She tolerates the infusions well.   Her last JCV antibody was 04/25/2017.  It was indeterminate at 0.28.  However, the inhibition assay was positive.  This would imply a risk of Tysabri  possibly in the 1: 2000 range.  Most of her symptoms are mostly the same.  However, she notes a little more difficulty with her balance and she has had a couple falls.  One fall hurt her knee and she had fluid drained.   Both falls were forward.   She did not trip.   She hit her head with the one fall but no LOc.   She has some difficulty with vision.  Bladder function is fine.   She has fast low amplitude tremor, worse with intention.  Her mom had a tremor.    Her fatigue has done fairly well.  She is sleeping well with BiPAP.  She has had excellent compliance and efficacy on download.  She denies any significant depression or anxiety.  She notes mild cognitive problems like reduced focus and attention, STM and processing.  Adderall has helped the fatigue and the attentional deficits.   She takes 20-30 mg daily.   She has tramadol but tries to avoid it if she can.     She is also on Wellbutrin and knows not to take more than 2 tramadol's in any 24-hour period.   She has noted some neck pain and also     She notes more sweating  Update 05/11/2017: She feels that her MS has been mostly stable. She continues on Tysabri and tolerates it well. Last JCV antibody showed a titer of 0.28 and the inhib assay was positive. This is similar to her previous one from last year. She notes that the gait is stable. She did have one fall and hitting a coffee table. She notes slight weakness in the right leg. There is no numbness. Bladder function is fine. She does note that vision is doing worse and she plans on seeing her ophthalmologist.  She feels that her fatigue is doing well. She is on BiPAP and sleeps well most nights. She brought in her chip in the download shows 90% compliance with at greater than 4 hours at night. The AHI was 5.8 the feels that mood is doing well on her current regimen. She doesn't note any significant cognitive problems. Adderall has helped her fatigue and focus and attention. She takes tramadol  now and then for her leg pain. She never takes more than 2 in any day. We discussed that the combination of tramadol and Wellbutrin can increased risk of seizures that she should never take more than 2 tramadol any day.   From 11/09/2016: MS:   She has noted more trouble with her vision but otherwise feels about the same as the last visit. Marland Kitchen   MRI of the spine and brain  05/2016 comparedt to 2016 MRI is essentially unchanged.   The MRI of the cervical spine shows C4-C6 ACDF and has 2-3 small spine spots.   She is on Tysabri. She tolerates it well and has not had any exacerbations on it. Her JCV antibody status was indeterminate at last check (0.33 05/11/2016)   Gait/strength/sensation:   Her gait is stable but she feels off balance at times. She has had a few falls hurting her knee with a recent one. Her right leg feels mildly weaker than the left leg and she drags it when she is tired.   She denies any paresthesias.  Bladder/bowel:     Her urinary urgency and incontinence is doing better with Myrbetriq   No recent bowel incontinence.  Vision:   She feels vision is worse and she notes more diplopia and blurry vision.  This occurs randomly, not just if hot or tired.     . No eye pain.    Fatigue/sleep:   She feels that her fatigue is doing fairly well. Is mild most days but worse with heat and if she has a more active day. The fatigue is both physical and cognitive. She is sleeping fairly well most nights.    Adderall helps fatigue and also slightly helps cognitive issues.    She has OSA and is on BiPAP (Since 2012)   She was told OSA was severe.     She uses BiPAP every night for > 4 hours.        She denies excessive daytime sleepiness but has fatigue.   Her mom noted that shsnored some through her mask.   No recent download but compliance download  11/08/15 shows excellent compliance of 97% and efficacy with AHI only 3.2 on BiPAP 11/6.  Mood/cognition:  Effexor and Wellbutrin helped mood at first but  she was more depressed last year and she changed Effexor to [place] is mild.   She notes difficulty with focus and has had some difficulty with verbal fluency and short term memory.      Adderall only helps a little bit.  MS History:   In 2008, she had headaches and poor balance and was told the MRI had some white matter foci at that time.    She had spinal stenosis and needed surgery in 2014.  The surgeon felt her gait and other issues were decreased out of proportion to the extent of spine disease and referred her to Neurology.   Besides gait issues, she also had double vision, blurry vision, cognitive difficulty and incontinence in early 2015. She saw Dr. Pamalee Leyden in [place]. An MRI of the brain, CSF and visual evoked potentials were consistent with multiple sclerosis.    Initially, she was placed on Tecfidera. Due to progression with more neurologic symptoms, she was switched to Tysabri.   Her last MRI was done 10/2014 but we do not have the images.     REVIEW OF SYSTEMS: Constitutional: No fevers, chills, sweats, or change in appetite.   She notes fatigue Eyes: Reports visual changes and double vision.  No eye pain Ear, nose and throat: No hearing loss, ear pain, nasal congestion, sore throat Cardiovascular: No chest pain, palpitations Respiratory: No shortness of breath at rest or with exertion.   She has snoring and coughing but no wheezes GastrointestinaI: No nausea, vomiting, abdominal pain.  She notes diarrhea and some fecal incontinence Genitourinary: No dysuria, urinary retention or frequency. She has had some incontinence Musculoskeletal:  No neck pain, back pain Integumentary: No rash, pruritus, skin lesions Neurological: as above Psychiatric: Some depression at this time.  Some anxiety Endocrine: No palpitations, diaphoresis, change in appetite, change in weigh or increased thirst Hematologic/Lymphatic: No anemia, purpura, petechiae. Allergic/Immunologic: No  itchy/runny eyes, nasal congestion, recent allergic reactions, rashes  ALLERGIES: Allergies  Allergen Reactions  . Clindamycin Anaphylaxis  . Indomethacin Hives  . Levofloxacin Hives  . Penicillins Anaphylaxis  . Penicillins Cross Reactors Anaphylaxis  . Sulfa Antibiotics Swelling    Blister (steven john syndrome)  . Levaquin [Levofloxacin Hemihydrate] Hives  . Lipitor  [Atorvastatin Calcium]   . Sulfamethoxazole-Trimethoprim   . Zocor [Simvastatin] Other (See Comments)    Other reaction(s): MUSCLE PAIN Muscle aches    HOME MEDICATIONS:  Current Outpatient Medications:  .  alendronate (FOSAMAX) 70 MG tablet, Take 70 mg by mouth once a week. Take with a full glass of water on an empty stomach., Disp: , Rfl:  .  amphetamine-dextroamphetamine (ADDERALL) 20 MG tablet, 1/2 to 1 pill po twice daily, Disp: 60 tablet, Rfl: 0 .  aspirin EC 81 MG tablet, Take 81 mg by mouth every other day.  , Disp: , Rfl:  .  Biotin 10000 MCG TABS, Take by mouth., Disp: , Rfl:  .  buPROPion (WELLBUTRIN SR) 150 MG 12 hr tablet, Take 150 mg by mouth 2 (two) times daily. , Disp: , Rfl:  .  cholecalciferol (VITAMIN D) 1000 UNITS tablet, Take 5,000 Units by mouth daily.  , Disp: , Rfl:  .  Cladribine (MAVENCLAD, 9 TABS, PO), Take by mouth., Disp: , Rfl:  .  co-enzyme Q-10 30 MG capsule, Take 30 mg by mouth daily. , Disp: , Rfl:  .  cyclobenzaprine (FLEXERIL) 5 MG tablet, TAKE 1 TABLET BY MOUTH THREE TIMES DAILY AS NEEDED, Disp: 90 tablet, Rfl: 5 .  DULERA 200-5 MCG/ACT AERO, , Disp: , Rfl:  .  estradiol (CLIMARA - DOSED IN MG/24 HR) 0.0375 mg/24hr patch, estradiol 0.0375 mg/24 hr weekly transdermal patch, Disp: , Rfl:  .  ezetimibe (ZETIA) 10 MG tablet, Take 10 mg by mouth., Disp: , Rfl:  .  gabapentin (NEURONTIN) 300 MG capsule, TAKE 1 CAPSULE BY MOUTH THREE TIMES DAILY, Disp: 270 capsule, Rfl: 3 .  Krill Oil 1000 MG CAPS, Take 1,000 mg by mouth., Disp: , Rfl:  .  MYRBETRIQ 50 MG TB24 tablet, TAKE 1 TABLET BY  MOUTH ONCE DAILY, Disp: 90 tablet, Rfl: 3 .  omeprazole (PRILOSEC) 20 MG capsule, Take 20 mg by mouth., Disp: , Rfl:  .  traZODone (DESYREL) 100 MG tablet, TAKE ONE TABLET BY MOUTH AT BEDTIME, Disp: 90 tablet, Rfl: 4 .  TURMERIC PO, Take by mouth., Disp: , Rfl:  .  venlafaxine XR (EFFEXOR XR) 150 MG 24 hr capsule, Take 2 capsules by mouth daily., Disp: , Rfl:   PAST MEDICAL HISTORY: Past Medical History:  Diagnosis Date  . Ankle fracture   . Osteopenia   . Scarlet fever   . Shingles   . Sleep apnea   . Spina bifida   . Vitamin D deficiency     PAST SURGICAL HISTORY: Past Surgical History:  Procedure Laterality Date  . ABDOMINAL HYSTERECTOMY      FAMILY HISTORY: History reviewed. No pertinent family history.  SOCIAL HISTORY:  Social History   Socioeconomic History  . Marital status: Divorced    Spouse name: Not on file  . Number of children: Not on file  . Years of education: Not  on file  . Highest education level: Not on file  Occupational History  . Not on file  Social Needs  . Financial resource strain: Not on file  . Food insecurity:    Worry: Not on file    Inability: Not on file  . Transportation needs:    Medical: Not on file    Non-medical: Not on file  Tobacco Use  . Smoking status: Current Some Day Smoker  . Smokeless tobacco: Never Used  Substance and Sexual Activity  . Alcohol use: No  . Drug use: No  . Sexual activity: Not on file  Lifestyle  . Physical activity:    Days per week: Not on file    Minutes per session: Not on file  . Stress: Not on file  Relationships  . Social connections:    Talks on phone: Not on file    Gets together: Not on file    Attends religious service: Not on file    Active member of club or organization: Not on file    Attends meetings of clubs or organizations: Not on file    Relationship status: Not on file  . Intimate partner violence:    Fear of current or ex partner: Not on file    Emotionally abused: Not  on file    Physically abused: Not on file    Forced sexual activity: Not on file  Other Topics Concern  . Not on file  Social History Narrative  . Not on file     PHYSICAL EXAM  Vitals:   07/05/18 0829  BP: 132/87  Pulse: 98  Weight: 212 lb (96.2 kg)  Height: 5\' 4"  (1.626 m)    Body mass index is 36.39 kg/m.   General: The patient is well-developed and well-nourished and in no acute distress  Neck: The neck is supple with good ROM and nontender.      Neurologic Exam  Mental status: The patient is alert and oriented x 3 at the time of the examination.  During the interaction today, she has apparent normal recent and remote memory, with an apparently normal attention span and concentration ability.   Speech is normal.  Cranial nerves: Extraocular movements appear normal.  Facial strength was normal.  Trapezius strength is normal.  No dysarthria is noted.  No obvious hearing deficits are noted.  Motor:  Muscle bulk is normal.   Tone is normal. Strength is  5 / 5 in all 4 extremities.   Sensory: She reports slightly reduced sensation to touch and temperature on the left but symmetric vibration sensation in the arms and legs.  Coordination: Cerebellar testing reveals good finger-nose-finger  Gait and station: Station is normal.  Gait is mildly wide.  Tandem gait is moderately wide.  Reflexes: Deep tendon reflexes are symmetric and 2+ arms and 3+ knees with spread.   No ankle clonus.    DIAGNOSTIC DATA (LABS, IMAGING, TESTING) - I reviewed patient records, labs, notes, testing and imaging myself where available.       ASSESSMENT AND PLAN   Multiple sclerosis (HCC)  OSA treated with BiPAP  High risk medication use  Gait disturbance  Other fatigue  Urge incontinence    1.   She was thought [place] today.  We discussed checking blood work in 2 months and she will need a follow-up visit.   2.   Try to stay active and exercise as tolerated. 3.    Continue BiPAP.  She uses it nightly. 4.  Return in 3 months or sooner if there are new or worsening neurologic symptoms.   Richard A. Felecia Shelling, MD, PhD 11/09/3557, 74:16 AM Certified in Neurology, Clinical Neurophysiology, Sleep Medicine, Pain Medicine and Neuroimaging . [facility] [address] [place], [place] [address] [phone]

## 2018-07-12 ENCOUNTER — Telehealth: Payer: Self-pay | Admitting: Neurology

## 2018-07-12 NOTE — Telephone Encounter (Signed)
Helen Sandoval from [facility] called needing confirmation that the pt is off of her Tysabri. Please advise.

## 2018-07-12 NOTE — Telephone Encounter (Signed)
I called and spoke with Helen Sandoval. Advised pt no longer on Tysabri, therapy switched. They will send form for [place] to fill out and send back. Gave them fax: [phone].

## 2018-07-12 NOTE — Telephone Encounter (Signed)
Tysabri discontinuation form completed and faxed back to [facility] at  # [phone]. Confirmation received.

## 2018-07-25 ENCOUNTER — Telehealth: Payer: Self-pay | Admitting: Neurology

## 2018-07-25 NOTE — Telephone Encounter (Signed)
Spoke with Dr. Felecia Shelling- she should get her second cycle 08/01/18-08/05/18.  She has f/u 08/29/18.

## 2018-07-25 NOTE — Telephone Encounter (Signed)
I called Helen Sandoval. Pt told [facility] that she would not start second cycle until 08-30-18 per Dr. Felecia Shelling. This is incorrect per Dr. Felecia Shelling.  I will check with Margie in research first thing in the morning to verify and then call pt to clarify. [facility] cannot ship medication until they verify this.

## 2018-07-25 NOTE — Telephone Encounter (Signed)
Sharyn Lull [facility] Nurse) is calling in and stated patient took her first cycle 07/05/18-07/09/18, she is scheduled to take cycle 2 08/01/18-08/05/18 , but the patient reports she will begin cycle 2 08/30/18 which is outside the window for cycle 2 , she wants to follow up to see if that will be ok  CB# [phone]

## 2018-07-26 ENCOUNTER — Other Ambulatory Visit: Payer: Self-pay | Admitting: *Deleted

## 2018-07-26 MED ORDER — AMPHETAMINE-DEXTROAMPHETAMINE 20 MG PO TABS: ORAL_TABLET | ORAL | 0 refills | Status: DC

## 2018-07-26 NOTE — Telephone Encounter (Signed)
Spoke with Margie in research. Pt should be taking her second cycle starting on 08/02/18. She will call pt and clarify this with her. Nothing further needed.

## 2018-08-01 DIAGNOSIS — G4733 Obstructive sleep apnea (adult) (pediatric): Secondary | ICD-10-CM | POA: Diagnosis not present

## 2018-08-07 ENCOUNTER — Telehealth: Payer: Self-pay | Admitting: *Deleted

## 2018-08-07 NOTE — Telephone Encounter (Signed)
Received fax notification from [facility] that pt completed her first year of her treatment with [facility].  [facility] ID# [id number].  Last day of treatment: 08/06/18.

## 2018-08-28 ENCOUNTER — Telehealth: Payer: Self-pay | Admitting: Neurology

## 2018-08-28 MED ORDER — AMPHETAMINE-DEXTROAMPHETAMINE 20 MG PO TABS: ORAL_TABLET | ORAL | 0 refills | Status: DC

## 2018-08-28 NOTE — Telephone Encounter (Signed)
Just a FYI patient requested a refill on her adderall.

## 2018-08-28 NOTE — Telephone Encounter (Signed)
Dr. Sater- please advise 

## 2018-08-28 NOTE — Addendum Note (Signed)
Addended by: Britt Bottom on: 08/28/2018 04:32 PM   Modules accepted: Orders

## 2018-08-28 NOTE — Telephone Encounter (Signed)
I spoke to the patient and offered her a virtual visit for tomorrow. She stated that if she doesn't need to do labs or come in for the research then she is fine with not having a visit right now because nothing has changed. But she stated that if she needs to do the virtual visit then she will. Please advise.

## 2018-08-29 ENCOUNTER — Other Ambulatory Visit: Payer: Self-pay

## 2018-08-29 ENCOUNTER — Ambulatory Visit: Payer: Medicare HMO | Admitting: Neurology

## 2018-08-29 VITALS — BP 140/93 | HR 88 | Ht 64.0 in | Wt 213.0 lb

## 2018-08-29 DIAGNOSIS — G35 Multiple sclerosis: Secondary | ICD-10-CM | POA: Diagnosis not present

## 2018-08-29 DIAGNOSIS — G4733 Obstructive sleep apnea (adult) (pediatric): Secondary | ICD-10-CM

## 2018-08-29 DIAGNOSIS — F329 Major depressive disorder, single episode, unspecified: Secondary | ICD-10-CM

## 2018-08-29 DIAGNOSIS — G250 Essential tremor: Secondary | ICD-10-CM

## 2018-08-29 DIAGNOSIS — Z79899 Other long term (current) drug therapy: Secondary | ICD-10-CM | POA: Diagnosis not present

## 2018-08-29 DIAGNOSIS — R269 Unspecified abnormalities of gait and mobility: Secondary | ICD-10-CM | POA: Diagnosis not present

## 2018-08-29 DIAGNOSIS — N3941 Urge incontinence: Secondary | ICD-10-CM

## 2018-08-29 DIAGNOSIS — F419 Anxiety disorder, unspecified: Secondary | ICD-10-CM | POA: Diagnosis not present

## 2018-08-29 MED ORDER — PHENTERMINE HCL 37.5 MG PO CAPS: 37.5000 mg | ORAL_CAPSULE | ORAL | 5 refills | Status: DC

## 2018-08-29 MED ORDER — TRAZODONE HCL 100 MG PO TABS: 100.0000 mg | ORAL_TABLET | Freq: Every day | ORAL | 4 refills | Status: DC

## 2018-08-29 NOTE — Progress Notes (Signed)
[facility]  PATIENT: Helen Sandoval DOB: [birth year]  REFERRING DOCTOR OR PCP:  Otto Herb. (Neuro-Stem)   Sinclair Ship (new PCP at [facility]) SOURCE: patient and records from [facility]  _________________________________   HISTORICAL  CHIEF COMPLAINT:  Chief Complaint  Patient presents with   Follow-up    RM 13, alone. Last seen 07/05/18. pt completed her first year of her treatment with Mavenclad. MSlifelines ID# [id number]. She needs some labs today.   Medication Refill    She is requesting refill on adderall    HISTORY OF PRESENT ILLNESS:  Helen Sandoval is a 58 yo woman who was diagnosed with MS in 2015.     Update 08/29/2018: She had her 1st and 2nd months of [place] in February and March.   She noted mild headache and fatigue the weeks she took the [facility].   Headaches and fatigue are now better.    Gait is doing well with some imbalance but no falls.   Strength and sensation are unchanged.   Her urinary urge and urge incontinence are much better with Myrbetriq.    Mood is ok.   She has mild stable depression.       She is sleeping well at night taking trazodone.  She is on CPAP for OSA.     Adderall has not helped fatigue or focus/attention .    Update 07/05/2018: She feels she is doing about the same as she was at her last visit with no new MS exacerbations.  She does feel more fatigued.  Her gait does fairly well and she is able to walk a mile and keep up with her friends.  She denies significant weakness or spasticity.  She does note some numbness at times on the left.  She has reduced color vision out of the left eye and mildly more blurry vision out of that eye.  She has mild bladder symptoms that are well controlled by medications.  Her main problems are fatigue and cognitive/attention.  She has had some difficulty with verbal fluency and with executive function such as following recipes.   Today, she will be starting [place] and  her drug is been shipped to her.  She will be followed in the MASTER study.   Update1/10/2018 Her MS has been stable.  Unfortunately, her risk of PML has increased.  Her JCV Ab titer increased to 2.32 and I discussed changes in therapy.    She is here today to discuss in more detail.   Her last Tysabri was late December.  She had MRI of the brain and spine performed towards the end of December.  I personally reviewed the images with her today.  The MRI of the brain shows multiple T2/flair hyperintense foci in a stable pattern consistent with chronic demyelination (probably with chronic microvascular ischemic change mixed in).  MRI of the cervical spine did not show any new lesions in the spine.  She does have some degenerative changes above and below her fusion.  She continues to report some pain going down the left arm.  Since September, she reports a sinus infection.  She was treated with doxycycline and a Z-pak.   She is taking guafinescan and trying to reduce smoking (now 1/4 ppd).   She has Dulera and ventolin.   She was told a CXR was normal.  She has not had PFT's.    MRI was reviewed and no definite sinusitis noted.  We had a long conversation about different  disease modifying therapies for her MS. She was interested in [place], [place] or [place].   Because she appears to have frequent respiratory infections, [place] may have more risk for her.  We went into details about [place] and [place] and she is potentially interested in these and will think about this more over the next week while we are waiting for blood work to return.  Update 04/30/2018: She is on Tysabri every 6 weeks (had low positive JCV antibody 0.61).  She tolerates it well and has not had any exacerbations.  However, she felt much better when she took it every 4 weeks experiencing less joint aches and fatigue.   She also has more headaches and more tremors.     Her HA are starting in her neck and they radiate up.       Her  gait is doing well.   Occasional stumbles but no falls to the ground.    Bladder is doing well.   Vision is ok.   She has had a little vertigo with mild diplopia at times    She was looking outside and thought she saw squirrels.   She has difficulty focusing while driving.   She is sleepy and will doze off some afternoons and evenings.  Her insomnia is doing well.  She has OSA and uses BiPAP.   For ADD and sleepiness symptoms, she takes Adderall 10 mg po tid.     Update 11/09/2017: She is on Tysabri.  She tolerates the infusions well.   Her last JCV antibody was 04/25/2017.  It was indeterminate at 0.28.  However, the inhibition assay was positive.  This would imply a risk of Tysabri possibly in the 1: 2000 range.  Most of her symptoms are mostly the same.  However, she notes a little more difficulty with her balance and she has had a couple falls.  One fall hurt her knee and she had fluid drained.   Both falls were forward.   She did not trip.   She hit her head with the one fall but no LOc.   She has some difficulty with vision.  Bladder function is fine.   She has fast low amplitude tremor, worse with intention.  Her mom had a tremor.    Her fatigue has done fairly well.  She is sleeping well with BiPAP.  She has had excellent compliance and efficacy on download.  She denies any significant depression or anxiety.  She notes mild cognitive problems like reduced focus and attention, STM and processing.  Adderall has helped the fatigue and the attentional deficits.   She takes 20-30 mg daily.   She has tramadol but tries to avoid it if she can.     She is also on Wellbutrin and knows not to take more than 2 tramadol's in any 24-hour period.   She has noted some neck pain and also     She notes more sweating  Update 05/11/2017: She feels that her MS has been mostly stable. She continues on Tysabri and tolerates it well. Last JCV antibody showed a titer of 0.28 and the inhib assay was positive. This is  similar to her previous one from last year. She notes that the gait is stable. She did have one fall and hitting a coffee table. She notes slight weakness in the right leg. There is no numbness. Bladder function is fine. She does note that vision is doing worse and she plans on seeing her ophthalmologist.  She feels that her fatigue is doing well. She is on BiPAP and sleeps well most nights. She brought in her chip in the download shows 90% compliance with at greater than 4 hours at night. The AHI was 5.8 the feels that mood is doing well on her current regimen. She doesn't note any significant cognitive problems. Adderall has helped her fatigue and focus and attention. She takes tramadol now and then for her leg pain. She never takes more than 2 in any day. We discussed that the combination of tramadol and Wellbutrin can increased risk of seizures that she should never take more than 2 tramadol any day.   From 11/09/2016: MS:   She has noted more trouble with her vision but otherwise feels about the same as the last visit. Marland Kitchen   MRI of the spine and brain 05/2016 comparedt to 2016 MRI is essentially unchanged.   The MRI of the cervical spine shows C4-C6 ACDF and has 2-3 small spine spots.   She is on Tysabri. She tolerates it well and has not had any exacerbations on it. Her JCV antibody status was indeterminate at last check (0.33 05/11/2016)   Gait/strength/sensation:   Her gait is stable but she feels off balance at times. She has had a few falls hurting her knee with a recent one. Her right leg feels mildly weaker than the left leg and she drags it when she is tired.   She denies any paresthesias.  Bladder/bowel:     Her urinary urgency and incontinence is doing better with Myrbetriq   No recent bowel incontinence.  Vision:   She feels vision is worse and she notes more diplopia and blurry vision.  This occurs randomly, not just if hot or tired.     . No eye pain.    Fatigue/sleep:   She feels that  her fatigue is doing fairly well. Is mild most days but worse with heat and if she has a more active day. The fatigue is both physical and cognitive. She is sleeping fairly well most nights.    Adderall helps fatigue and also slightly helps cognitive issues.    She has OSA and is on BiPAP (Since 2012)   She was told OSA was severe.     She uses BiPAP every night for > 4 hours.        She denies excessive daytime sleepiness but has fatigue.   Her mom noted that shsnored some through her mask.   No recent download but compliance download  11/08/15 shows excellent compliance of 97% and efficacy with AHI only 3.2 on BiPAP 11/6.  Mood/cognition:  Effexor and Wellbutrin helped mood at first but she was more depressed last year and she changed Effexor to [place] is mild.   She notes difficulty with focus and has had some difficulty with verbal fluency and short term memory.      Adderall only helps a little bit.  MS History:   In 2008, she had headaches and poor balance and was told the MRI had some white matter foci at that time.    She had spinal stenosis and needed surgery in 2014.  The surgeon felt her gait and other issues were decreased out of proportion to the extent of spine disease and referred her to Neurology.   Besides gait issues, she also had double vision, blurry vision, cognitive difficulty and incontinence in early 2015. She saw Dr. Pamalee Leyden in [place]. An MRI of  the brain, CSF and visual evoked potentials were consistent with multiple sclerosis.    Initially, she was placed on Tecfidera. Due to progression with more neurologic symptoms, she was switched to Tysabri.   Her last MRI was done 10/2014 but we do not have the images.     REVIEW OF SYSTEMS: Constitutional: No fevers, chills, sweats, or change in appetite.   She notes fatigue Eyes: Reports visual changes and double vision.  No eye pain Ear, nose and throat: No hearing loss, ear pain, nasal congestion, sore  throat Cardiovascular: No chest pain, palpitations Respiratory: No shortness of breath at rest or with exertion.   She has snoring and coughing but no wheezes GastrointestinaI: No nausea, vomiting, abdominal pain.  She notes diarrhea and some fecal incontinence Genitourinary: No dysuria, urinary retention or frequency. She has had some incontinence Musculoskeletal: No neck pain, back pain Integumentary: No rash, pruritus, skin lesions Neurological: as above Psychiatric: Some depression at this time.  Some anxiety Endocrine: No palpitations, diaphoresis, change in appetite, change in weigh or increased thirst Hematologic/Lymphatic: No anemia, purpura, petechiae. Allergic/Immunologic: No itchy/runny eyes, nasal congestion, recent allergic reactions, rashes  ALLERGIES: Allergies  Allergen Reactions   Clindamycin Anaphylaxis   Indomethacin Hives   Levofloxacin Hives   Penicillins Anaphylaxis   Penicillins Cross Reactors Anaphylaxis   Sulfa Antibiotics Swelling    Blister (steven john syndrome)   Levaquin [Levofloxacin Hemihydrate] Hives   Lipitor  [Atorvastatin Calcium]    Sulfamethoxazole-Trimethoprim    Zocor [Simvastatin] Other (See Comments)    Other reaction(s): MUSCLE PAIN Muscle aches    HOME MEDICATIONS:  Current Outpatient Medications:    alendronate (FOSAMAX) 70 MG tablet, Take 70 mg by mouth once a week. Take with a full glass of water on an empty stomach., Disp: , Rfl:    amphetamine-dextroamphetamine (ADDERALL) 20 MG tablet, 1/2 to 1 pill po twice daily, Disp: 60 tablet, Rfl: 0   aspirin EC 81 MG tablet, Take 81 mg by mouth every other day.  , Disp: , Rfl:    Biotin 10000 MCG TABS, Take by mouth daily. , Disp: , Rfl:    buPROPion (WELLBUTRIN SR) 150 MG 12 hr tablet, Take 150 mg by mouth 2 (two) times daily. , Disp: , Rfl:    cholecalciferol (VITAMIN D) 1000 UNITS tablet, Take 5,000 Units by mouth daily.  , Disp: , Rfl:    Cladribine (MAVENCLAD, 9  TABS, PO), Take by mouth., Disp: , Rfl:    co-enzyme Q-10 30 MG capsule, Take 30 mg by mouth daily. , Disp: , Rfl:    cyclobenzaprine (FLEXERIL) 5 MG tablet, TAKE 1 TABLET BY MOUTH THREE TIMES DAILY AS NEEDED, Disp: 90 tablet, Rfl: 5   DULERA 200-5 MCG/ACT AERO, Inhale 2 puffs into the lungs daily. , Disp: , Rfl:    estradiol (CLIMARA - DOSED IN MG/24 HR) 0.0375 mg/24hr patch, estradiol 0.0375 mg/24 hr weekly transdermal patch, Disp: , Rfl:    ezetimibe (ZETIA) 10 MG tablet, Take 10 mg by mouth daily. , Disp: , Rfl:    gabapentin (NEURONTIN) 300 MG capsule, TAKE 1 CAPSULE BY MOUTH THREE TIMES DAILY, Disp: 270 capsule, Rfl: 3   Krill Oil 1000 MG CAPS, Take 1,000 mg by mouth daily. , Disp: , Rfl:    MYRBETRIQ 50 MG TB24 tablet, TAKE 1 TABLET BY MOUTH ONCE DAILY, Disp: 90 tablet, Rfl: 3   omeprazole (PRILOSEC) 20 MG capsule, Take 20 mg by mouth daily. , Disp: , Rfl:  traZODone (DESYREL) 100 MG tablet, TAKE ONE TABLET BY MOUTH AT BEDTIME, Disp: 90 tablet, Rfl: 4   TURMERIC PO, Take 1 capsule by mouth every morning. , Disp: , Rfl:    venlafaxine XR (EFFEXOR XR) 150 MG 24 hr capsule, Take 2 capsules by mouth daily., Disp: , Rfl:   PAST MEDICAL HISTORY: Past Medical History:  Diagnosis Date   Ankle fracture    Osteopenia    Scarlet fever    Shingles    Sleep apnea    Spina bifida    Vitamin D deficiency     PAST SURGICAL HISTORY: Past Surgical History:  Procedure Laterality Date   ABDOMINAL HYSTERECTOMY      FAMILY HISTORY: No family history on file.  SOCIAL HISTORY:  Social History   Socioeconomic History   Marital status: Divorced    Spouse name: Not on file   Number of children: Not on file   Years of education: Not on file   Highest education level: Not on file  Occupational History   Not on file  Social Needs   Financial resource strain: Not on file   Food insecurity:    Worry: Not on file    Inability: Not on file   Transportation  needs:    Medical: Not on file    Non-medical: Not on file  Tobacco Use   Smoking status: Current Some Day Smoker   Smokeless tobacco: Never Used  Substance and Sexual Activity   Alcohol use: No   Drug use: No   Sexual activity: Not on file  Lifestyle   Physical activity:    Days per week: Not on file    Minutes per session: Not on file   Stress: Not on file  Relationships   Social connections:    Talks on phone: Not on file    Gets together: Not on file    Attends religious service: Not on file    Active member of club or organization: Not on file    Attends meetings of clubs or organizations: Not on file    Relationship status: Not on file   Intimate partner violence:    Fear of current or ex partner: Not on file    Emotionally abused: Not on file    Physically abused: Not on file    Forced sexual activity: Not on file  Other Topics Concern   Not on file  Social History Narrative   Not on file     PHYSICAL EXAM  Vitals:   08/29/18 1423  BP: (!) 140/93  Pulse: 88  Weight: 213 lb (96.6 kg)  Height: 5\' 4"  (1.626 m)    Body mass index is 36.56 kg/m.   General: The patient is well-developed and well-nourished and in no acute distress  Neck: The neck is supple with good ROM and nontender.      Neurologic Exam  Mental status: The patient is alert and oriented x 3 at the time of the examination.  During the interaction today, she has apparent normal recent and remote memory, with an apparently normal attention span and concentration ability.   Speech is normal.  Cranial nerves: Extraocular movements appear normal.  Facial strength was normal.  Trapezius strength is normal.  No dysarthria is noted.  No obvious hearing deficits are noted.  Motor:  Muscle bulk is normal.   Tone is normal. Strength is  5 / 5 in all 4 extremities.   Sensory: She reports slightly reduced sensation to touch  and temperature on the left but symmetric vibration sensation in the  arms and legs.  Coordination: Finger-nose-finger and heel-to-shin is performed well.  Gait and station: Station is normal.  The gait is mildly wide but the tandem gait is moderately wide.  Reflexes: Deep tendon reflexes are symmetric and 2+ arms and 3+ knees with spread.   There is no ankle clonus.Marland Kitchen    DIAGNOSTIC DATA (LABS, IMAGING, TESTING) - I reviewed patient records, labs, notes, testing and imaging myself where available.       ASSESSMENT AND PLAN   Multiple sclerosis (HCC)  Benign essential tremor  OSA treated with BiPAP  Anxiety and depression  Gait disturbance  Urge incontinence    1.  She has done well since taking Mavenclad.  We did discuss that she is likely at her lymphocyte nadir right now.  She needs to be especially careful with the coronavirus and continue to socially distance or isolate.  We will check CBC and LFT today. 2.   Try to stay active and exercise as tolerated. 3.   Continue BiPAP.  She uses it nightly. 4.   Phentermine 37.5 mg in the morning for sleepiness, weight loss and fatigue.  Renew trazodone.   Return in 4 months or sooner if there are new or worsening neurologic symptoms.   Davena Julian A. Felecia Shelling, MD, PhD 09/21/8982, 2:10 PM Certified in Neurology, Clinical Neurophysiology, Sleep Medicine, Pain Medicine and Neuroimaging . [facility] [address], [place], [place] [address] [phone]

## 2018-08-30 ENCOUNTER — Telehealth: Payer: Self-pay | Admitting: *Deleted

## 2018-08-30 DIAGNOSIS — Z0289 Encounter for other administrative examinations: Secondary | ICD-10-CM

## 2018-08-30 LAB — CBC WITH DIFFERENTIAL/PLATELET
Basophils Absolute: 0 10*3/uL (ref 0.0–0.2)
Basos: 0 %
EOS (ABSOLUTE): 0 10*3/uL (ref 0.0–0.4)
Eos: 1 %
Hematocrit: 39.5 % (ref 34.0–46.6)
Hemoglobin: 13.4 g/dL (ref 11.1–15.9)
Immature Grans (Abs): 0 10*3/uL (ref 0.0–0.1)
Immature Granulocytes: 0 %
Lymphocytes Absolute: 0.2 10*3/uL — ABNORMAL LOW (ref 0.7–3.1)
Lymphs: 4 %
MCH: 30.5 pg (ref 26.6–33.0)
MCHC: 33.9 g/dL (ref 31.5–35.7)
MCV: 90 fL (ref 79–97)
Monocytes Absolute: 0.6 10*3/uL (ref 0.1–0.9)
Monocytes: 11 %
Neutrophils Absolute: 4.4 10*3/uL (ref 1.4–7.0)
Neutrophils: 84 %
Platelets: 135 10*3/uL — ABNORMAL LOW (ref 150–450)
RBC: 4.39 x10E6/uL (ref 3.77–5.28)
RDW: 15 % (ref 11.7–15.4)
WBC: 5.2 10*3/uL (ref 3.4–10.8)

## 2018-08-30 LAB — HEPATIC FUNCTION PANEL
ALT: 50 IU/L — ABNORMAL HIGH (ref 0–32)
AST: 37 IU/L (ref 0–40)
Albumin: 4.7 g/dL (ref 3.8–4.9)
Alkaline Phosphatase: 67 IU/L (ref 39–117)
Bilirubin Total: 0.7 mg/dL (ref 0.0–1.2)
Bilirubin, Direct: 0.22 mg/dL (ref 0.00–0.40)
Total Protein: 6.2 g/dL (ref 6.0–8.5)

## 2018-08-30 MED ORDER — VALACYCLOVIR HCL 500 MG PO TABS: ORAL_TABLET | ORAL | 1 refills | Status: DC

## 2018-08-30 NOTE — Telephone Encounter (Signed)
-----   Message from Britt Bottom, MD sent at 08/30/2018  8:46 AM EDT ----- Please let her know that the lymphocyte counts were mildly lower than expected and I would like her to take Valtrex  500 mg bid x 8 weeks --- we can call in #[phone] #[phone].   She is likely at the lowest lymphocyte count now so continue to socially distance

## 2018-08-30 NOTE — Telephone Encounter (Signed)
I called pt. Relayed Dr. Garth Bigness note about lab results. She verbalized understanding. I escribed rx. Advised her to call if she has any further questions or concerns. She verbalized understanding.

## 2018-09-03 ENCOUNTER — Telehealth: Payer: Self-pay | Admitting: *Deleted

## 2018-09-03 NOTE — Telephone Encounter (Signed)
Gave completed/signed disability forms [employer]) back to medical records to process for pt.

## 2018-10-31 ENCOUNTER — Ambulatory Visit: Payer: Medicare HMO | Admitting: Neurology

## 2018-11-01 DIAGNOSIS — G4733 Obstructive sleep apnea (adult) (pediatric): Secondary | ICD-10-CM | POA: Diagnosis not present

## 2019-01-03 ENCOUNTER — Ambulatory Visit (INDEPENDENT_AMBULATORY_CARE_PROVIDER_SITE_OTHER): Payer: Medicare HMO | Admitting: Neurology

## 2019-01-03 ENCOUNTER — Encounter: Payer: Self-pay | Admitting: Neurology

## 2019-01-03 ENCOUNTER — Other Ambulatory Visit: Payer: Self-pay

## 2019-01-03 ENCOUNTER — Other Ambulatory Visit: Payer: Self-pay | Admitting: Internal Medicine

## 2019-01-03 VITALS — BP 143/91 | HR 94 | Temp 97.9°F | Ht 64.0 in | Wt 215.2 lb

## 2019-01-03 DIAGNOSIS — Z79899 Other long term (current) drug therapy: Secondary | ICD-10-CM | POA: Diagnosis not present

## 2019-01-03 DIAGNOSIS — F419 Anxiety disorder, unspecified: Secondary | ICD-10-CM | POA: Diagnosis not present

## 2019-01-03 DIAGNOSIS — F329 Major depressive disorder, single episode, unspecified: Secondary | ICD-10-CM

## 2019-01-03 DIAGNOSIS — R269 Unspecified abnormalities of gait and mobility: Secondary | ICD-10-CM | POA: Diagnosis not present

## 2019-01-03 DIAGNOSIS — G4733 Obstructive sleep apnea (adult) (pediatric): Secondary | ICD-10-CM

## 2019-01-03 DIAGNOSIS — R5383 Other fatigue: Secondary | ICD-10-CM | POA: Diagnosis not present

## 2019-01-03 DIAGNOSIS — G35 Multiple sclerosis: Secondary | ICD-10-CM

## 2019-01-03 DIAGNOSIS — F32A Depression, unspecified: Secondary | ICD-10-CM

## 2019-01-03 DIAGNOSIS — Z1231 Encounter for screening mammogram for malignant neoplasm of breast: Secondary | ICD-10-CM

## 2019-01-03 DIAGNOSIS — G35D Multiple sclerosis, unspecified: Secondary | ICD-10-CM

## 2019-01-03 MED ORDER — BUSPIRONE HCL 15 MG PO TABS: 15.0000 mg | ORAL_TABLET | Freq: Two times a day (BID) | ORAL | 5 refills | Status: DC

## 2019-01-03 NOTE — Progress Notes (Signed)
[facility]  PATIENT: Helen Sandoval DOB: 11-30-1960  REFERRING DOCTOR OR PCP:  Otto Herb. ([facility])   Sinclair Ship (new PCP at [facility]) SOURCE: patient and records from [facility]  _________________________________   HISTORICAL  CHIEF COMPLAINT:  Chief Complaint  Patient presents with  . Follow-up    RM 12, alone. Last seen 08/29/2018. Gave pt copy of disability forms completed in 08/2018. She already signed release form them to get copy but never received in the mail.  . Multiple Sclerosis    On Mavenclad  . Sleep Apnea    On bipap. Takes phentermine for weight loss, sleepiness, fatigue. Trazodone for sleep.    HISTORY OF PRESENT ILLNESS:  Helen Sandoval is a 58 yo woman who was diagnosed with MS in 2015.     Update 01/03/2019: She is 6 months out from her first dose of Mavenclad.  At month 2, the lymphocyte count was 0.2.  Platelet count was 135.  We will repeat blood work today.  She denies any definite exacerbations though does have a list of medical complaints.  She feels her balance is worse and she has 3 falls since her last visit.     She notes more confusion at times.   Insomnia is worse.   She also notes more depression.  She also notes more fatigue.   She has been taking more naps.   She has OSA and uses BiPAP.  Her sister tells her she snores through her mask.   Of note, weight is increased.    She has more symptoms including headaches, joint pain and increased sweating.     She notes more depression.   Viibryd was prescribed but poorly covered by insurance so she stayed on Effexor 300 mg .   She notes anxiety as well.    Update 08/29/2018: She had her 1st and 2nd months of [place] in February and March.   She noted mild headache and fatigue the weeks she took the [facility].   Headaches and fatigue are now better.    Gait is doing well with some imbalance but no falls.   Strength and sensation are unchanged.   Her urinary  urge and urge incontinence are much better with Myrbetriq.    Mood is ok.   She has mild stable depression.       She is sleeping well at night taking trazodone.  She is on CPAP for OSA.     Adderall has not helped fatigue or focus/attention .    Update 07/05/2018: She feels she is doing about the same as she was at her last visit with no new MS exacerbations.  She does feel more fatigued.  Her gait does fairly well and she is able to walk a mile and keep up with her friends.  She denies significant weakness or spasticity.  She does note some numbness at times on the left.  She has reduced color vision out of the left eye and mildly more blurry vision out of that eye.  She has mild bladder symptoms that are well controlled by medications.  Her main problems are fatigue and cognitive/attention.  She has had some difficulty with verbal fluency and with executive function such as following recipes.   Today, she will be starting Alpha and her drug is been shipped to her.  She will be followed in the MASTER study.   Update1/10/2018 Her MS has been stable.  Unfortunately, her risk of PML has increased.  Her JCV Ab titer increased to 2.32 and I discussed changes in therapy.    She is here today to discuss in more detail.   Her last Tysabri was late December.  She had MRI of the brain and spine performed towards the end of December.  I personally reviewed the images with her today.  The MRI of the brain shows multiple T2/flair hyperintense foci in a stable pattern consistent with chronic demyelination (probably with chronic microvascular ischemic change mixed in).  MRI of the cervical spine did not show any new lesions in the spine.  She does have some degenerative changes above and below her fusion.  She continues to report some pain going down the left arm.  Since September, she reports a sinus infection.  She was treated with doxycycline and a Z-pak.   She is taking guafinescan and trying to reduce smoking  (now 1/4 ppd).   She has Dulera and ventolin.   She was told a CXR was normal.  She has not had PFT's.    MRI was reviewed and no definite sinusitis noted.  We had a long conversation about different disease modifying therapies for her MS. She was interested in [place], [place] or [place].   Because she appears to have frequent respiratory infections, Ocrevus may have more risk for her.  We went into details about [place] and [place] and she is potentially interested in these and will think about this more over the next week while we are waiting for blood work to return.  Update 04/30/2018: She is on Tysabri every 6 weeks (had low positive JCV antibody 0.61).  She tolerates it well and has not had any exacerbations.  However, she felt much better when she took it every 4 weeks experiencing less joint aches and fatigue.   She also has more headaches and more tremors.     Her HA are starting in her neck and they radiate up.       Her gait is doing well.   Occasional stumbles but no falls to the ground.    Bladder is doing well.   Vision is ok.   She has had a little vertigo with mild diplopia at times    She was looking outside and thought she saw squirrels.   She has difficulty focusing while driving.   She is sleepy and will doze off some afternoons and evenings.  Her insomnia is doing well.  She has OSA and uses BiPAP.   For ADD and sleepiness symptoms, she takes Adderall 10 mg po tid.     Update 11/09/2017: She is on Tysabri.  She tolerates the infusions well.   Her last JCV antibody was 04/25/2017.  It was indeterminate at 0.28.  However, the inhibition assay was positive.  This would imply a risk of Tysabri possibly in the 1: 2000 range.  Most of her symptoms are mostly the same.  However, she notes a little more difficulty with her balance and she has had a couple falls.  One fall hurt her knee and she had fluid drained.   Both falls were forward.   She did not trip.   She hit her head with the  one fall but no LOc.   She has some difficulty with vision.  Bladder function is fine.   She has fast low amplitude tremor, worse with intention.  Her mom had a tremor.    Her fatigue has done fairly well.  She is sleeping well with BiPAP.  She has had excellent compliance and efficacy on download.  She denies any significant depression or anxiety.  She notes mild cognitive problems like reduced focus and attention, STM and processing.  Adderall has helped the fatigue and the attentional deficits.   She takes 20-30 mg daily.   She has tramadol but tries to avoid it if she can.     She is also on Wellbutrin and knows not to take more than 2 tramadol's in any 24-hour period.   She has noted some neck pain and also     She notes more sweating  Update 05/11/2017: She feels that her MS has been mostly stable. She continues on Tysabri and tolerates it well. Last JCV antibody showed a titer of 0.28 and the inhib assay was positive. This is similar to her previous one from last year. She notes that the gait is stable. She did have one fall and hitting a coffee table. She notes slight weakness in the right leg. There is no numbness. Bladder function is fine. She does note that vision is doing worse and she plans on seeing her ophthalmologist.  She feels that her fatigue is doing well. She is on BiPAP and sleeps well most nights. She brought in her chip in the download shows 90% compliance with at greater than 4 hours at night. The AHI was 5.8 the feels that mood is doing well on her current regimen. She doesn't note any significant cognitive problems. Adderall has helped her fatigue and focus and attention. She takes tramadol now and then for her leg pain. She never takes more than 2 in any day. We discussed that the combination of tramadol and Wellbutrin can increased risk of seizures that she should never take more than 2 tramadol any day.   From 11/09/2016: MS:   She has noted more trouble with her vision but  otherwise feels about the same as the last visit. Marland Kitchen   MRI of the spine and brain 05/2016 comparedt to 2016 MRI is essentially unchanged.   The MRI of the cervical spine shows C4-C6 ACDF and has 2-3 small spine spots.   She is on Tysabri. She tolerates it well and has not had any exacerbations on it. Her JCV antibody status was indeterminate at last check (0.33 05/11/2016)   Gait/strength/sensation:   Her gait is stable but she feels off balance at times. She has had a few falls hurting her knee with a recent one. Her right leg feels mildly weaker than the left leg and she drags it when she is tired.   She denies any paresthesias.  Bladder/bowel:     Her urinary urgency and incontinence is doing better with Myrbetriq   No recent bowel incontinence.  Vision:   She feels vision is worse and she notes more diplopia and blurry vision.  This occurs randomly, not just if hot or tired.     . No eye pain.    Fatigue/sleep:   She feels that her fatigue is doing fairly well. Is mild most days but worse with heat and if she has a more active day. The fatigue is both physical and cognitive. She is sleeping fairly well most nights.    Adderall helps fatigue and also slightly helps cognitive issues.    She has OSA and is on BiPAP (Since 2012)   She was told OSA was severe.     She uses BiPAP every night for > 4 hours.  She denies excessive daytime sleepiness but has fatigue.   Her mom noted that shsnored some through her mask.   No recent download but compliance download  11/08/15 shows excellent compliance of 97% and efficacy with AHI only 3.2 on BiPAP 11/6.  Mood/cognition:  Effexor and Wellbutrin helped mood at first but she was more depressed last year and she changed Effexor to [place] is mild.   She notes difficulty with focus and has had some difficulty with verbal fluency and short term memory.      Adderall only helps a little bit.  MS History:   In 2008, she had headaches and poor balance and  was told the MRI had some white matter foci at that time.    She had spinal stenosis and needed surgery in 2014.  The surgeon felt her gait and other issues were decreased out of proportion to the extent of spine disease and referred her to Neurology.   Besides gait issues, she also had double vision, blurry vision, cognitive difficulty and incontinence in early 2015. She saw Dr. Pamalee Leyden in [place]. An MRI of the brain, CSF and visual evoked potentials were consistent with multiple sclerosis.    Initially, she was placed on Tecfidera. Due to progression with more neurologic symptoms, she was switched to Tysabri.   Her last MRI was done 10/2014 but we do not have the images.     REVIEW OF SYSTEMS: Constitutional: No fevers, chills, sweats, or change in appetite.   She notes fatigue Eyes: Reports visual changes and double vision.  No eye pain Ear, nose and throat: No hearing loss, ear pain, nasal congestion, sore throat Cardiovascular: No chest pain, palpitations Respiratory: No shortness of breath at rest or with exertion.   She has snoring and coughing but no wheezes GastrointestinaI: No nausea, vomiting, abdominal pain.  She notes diarrhea and some fecal incontinence Genitourinary: No dysuria, urinary retention or frequency. She has had some incontinence Musculoskeletal: No neck pain, back pain Integumentary: No rash, pruritus, skin lesions Neurological: as above Psychiatric: Some depression at this time.  Some anxiety Endocrine: No palpitations, diaphoresis, change in appetite, change in weigh or increased thirst Hematologic/Lymphatic: No anemia, purpura, petechiae. Allergic/Immunologic: No itchy/runny eyes, nasal congestion, recent allergic reactions, rashes  ALLERGIES: Allergies  Allergen Reactions  . Clindamycin Anaphylaxis  . Indomethacin Hives  . Levofloxacin Hives  . Penicillins Anaphylaxis  . Penicillins Cross Reactors Anaphylaxis  . Sulfa Antibiotics Swelling    Blister  (steven john syndrome)  . Levaquin [Levofloxacin Hemihydrate] Hives  . Lipitor  [Atorvastatin Calcium]   . Sulfamethoxazole-Trimethoprim   . Zocor [Simvastatin] Other (See Comments)    Other reaction(s): MUSCLE PAIN Muscle aches    HOME MEDICATIONS:  Current Outpatient Medications:  .  alendronate (FOSAMAX) 70 MG tablet, Take 70 mg by mouth once a week. Take with a full glass of water on an empty stomach., Disp: , Rfl:  .  buPROPion (WELLBUTRIN SR) 150 MG 12 hr tablet, Take 150 mg by mouth 2 (two) times daily. , Disp: , Rfl:  .  Chlorphen-PE-Acetaminophen (NOREL AD) 4-10-325 MG TABS, Take 1 tablet by mouth as needed., Disp: , Rfl:  .  cholecalciferol (VITAMIN D) 1000 UNITS tablet, Take 5,000 Units by mouth daily.  , Disp: , Rfl:  .  Cladribine (MAVENCLAD, 9 TABS, PO), Take by mouth., Disp: , Rfl:  .  co-enzyme Q-10 30 MG capsule, Take 30 mg by mouth daily. , Disp: , Rfl:  .  cyclobenzaprine (FLEXERIL) 5 MG tablet, TAKE 1 TABLET BY MOUTH THREE TIMES DAILY AS NEEDED, Disp: 90 tablet, Rfl: 5 .  DULERA 200-5 MCG/ACT AERO, Inhale 2 puffs into the lungs daily. , Disp: , Rfl:  .  estradiol (CLIMARA - DOSED IN MG/24 HR) 0.0375 mg/24hr patch, estradiol 0.0375 mg/24 hr weekly transdermal patch, Disp: , Rfl:  .  ezetimibe (ZETIA) 10 MG tablet, Take 10 mg by mouth daily. , Disp: , Rfl:  .  gabapentin (NEURONTIN) 300 MG capsule, TAKE 1 CAPSULE BY MOUTH THREE TIMES DAILY, Disp: 270 capsule, Rfl: 3 .  Krill Oil 1000 MG CAPS, Take 1,000 mg by mouth daily. , Disp: , Rfl:  .  MYRBETRIQ 50 MG TB24 tablet, TAKE 1 TABLET BY MOUTH ONCE DAILY, Disp: 90 tablet, Rfl: 3 .  omeprazole (PRILOSEC) 20 MG capsule, Take 20 mg by mouth daily. , Disp: , Rfl:  .  phentermine 37.5 MG capsule, Take 1 capsule (37.5 mg total) by mouth every morning., Disp: 30 capsule, Rfl: 5 .  traZODone (DESYREL) 100 MG tablet, Take 1 tablet (100 mg total) by mouth at bedtime., Disp: 90 tablet, Rfl: 4 .  TURMERIC PO, Take 1 capsule by mouth  every morning. , Disp: , Rfl:  .  venlafaxine XR (EFFEXOR XR) 150 MG 24 hr capsule, Take 2 capsules by mouth daily., Disp: , Rfl:  .  aspirin EC 81 MG tablet, Take 81 mg by mouth every other day.  , Disp: , Rfl:  .  Biotin 10000 MCG TABS, Take by mouth daily. , Disp: , Rfl:  .  busPIRone (BUSPAR) 15 MG tablet, Take 1 tablet (15 mg total) by mouth 2 (two) times daily., Disp: 60 tablet, Rfl: 5 .  valACYclovir (VALTREX) 500 MG tablet, Take 1 tablet (500mg ) twice daily for the next 8 weeks (Patient not taking: Reported on 01/03/2019), Disp: 60 tablet, Rfl: 1  PAST MEDICAL HISTORY: Past Medical History:  Diagnosis Date  . Ankle fracture   . Osteopenia   . Scarlet fever   . Shingles   . Sleep apnea   . Spina bifida   . Vitamin D deficiency     PAST SURGICAL HISTORY: Past Surgical History:  Procedure Laterality Date  . ABDOMINAL HYSTERECTOMY      FAMILY HISTORY: History reviewed. No pertinent family history.  SOCIAL HISTORY:  Social History   Socioeconomic History  . Marital status: Divorced    Spouse name: Not on file  . Number of children: Not on file  . Years of education: Not on file  . Highest education level: Not on file  Occupational History  . Not on file  Social Needs  . Financial resource strain: Not on file  . Food insecurity    Worry: Not on file    Inability: Not on file  . Transportation needs    Medical: Not on file    Non-medical: Not on file  Tobacco Use  . Smoking status: Current Some Day Smoker  . Smokeless tobacco: Never Used  Substance and Sexual Activity  . Alcohol use: No  . Drug use: No  . Sexual activity: Not on file  Lifestyle  . Physical activity    Days per week: Not on file    Minutes per session: Not on file  . Stress: Not on file  Relationships  . Social Herbalist on phone: Not on file    Gets together: Not on file    Attends religious service: Not on  file    Active member of club or organization: Not on file     Attends meetings of clubs or organizations: Not on file    Relationship status: Not on file  . Intimate partner violence    Fear of current or ex partner: Not on file    Emotionally abused: Not on file    Physically abused: Not on file    Forced sexual activity: Not on file  Other Topics Concern  . Not on file  Social History Narrative  . Not on file     PHYSICAL EXAM  Vitals:   01/03/19 1458  BP: (!) 143/91  Pulse: 94  Temp: 97.9 F (36.6 C)  Weight: 215 lb 3.2 oz (97.6 kg)  Height: 5\' 4"  (1.626 m)    Body mass index is 36.94 kg/m.   General: The patient is well-developed and well-nourished and in no acute distress  Neck: The neck is supple with good ROM and nontender.      Neurologic Exam  Mental status: The patient is alert and oriented x 3 at the time of the examination.  During the interaction today, she has apparent normal recent and remote memory, with an apparently normal attention span and concentration ability.   Speech is normal.  Cranial nerves: Extraocular movements appear normal.  Facial strength was normal.  Trapezius strength is normal.  No dysarthria is noted.  No obvious hearing deficits are noted.  Motor:  Muscle bulk is normal.   Tone is normal. Strength is  5 / 5 in all 4 extremities.   Sensory: She reports slightly reduced sensation to vibration in the left foot but symmetric at knee.    Coordination: Finger-nose-finger and heel-to-shin is performed well.  Gait and station: Station is normal.  Gait is mildly wide.  Tandem gait is moderately wide.  Romberg sign is negative.  Reflexes: Deep tendon reflexes are symmetric and 2+ arms and 3+ knees with spread.   There is no ankle clonus..    ASSESSMENT AND PLAN    1. Multiple sclerosis ([place])   2. OSA treated with BiPAP   3. High risk medication use   4. Other fatigue   5. Gait disturbance   6. Anxiety and depression     1.   For MS appears to be stable after taking Mavenclad earlier this  year.  We need to check a CBC and CMP today.   2.   She is having more depression and anxiety.  I will add BuSpar.  She is already on Wellbutrin and venlafaxine. 3.   Continue BiPAP.  She uses it nightly.   4.   Continue Phentermine 37.5 mg in the morning for sleepiness, weight loss and fatigue.  Ok to go on full pill of trazodone 100 mg .   Buspar for anxiety/depression  Return in 5 months or sooner if there are new or worsening neurologic symptoms.    A. Felecia Shelling, MD, PhD 4/40/3474, 2:59 PM Certified in Neurology, Clinical Neurophysiology, Sleep Medicine, Pain Medicine and Neuroimaging . [facility] [address], [place], [place] [address] [phone]

## 2019-01-04 LAB — COMPREHENSIVE METABOLIC PANEL
ALT: 41 IU/L — ABNORMAL HIGH (ref 0–32)
AST: 37 IU/L (ref 0–40)
Albumin/Globulin Ratio: 2.9 — ABNORMAL HIGH (ref 1.2–2.2)
Albumin: 4.6 g/dL (ref 3.8–4.9)
Alkaline Phosphatase: 59 IU/L (ref 39–117)
BUN/Creatinine Ratio: 13 (ref 12–28)
BUN: 14 mg/dL (ref 8–27)
Bilirubin Total: 0.8 mg/dL (ref 0.0–1.2)
CO2: 24 mmol/L (ref 20–29)
Calcium: 9.1 mg/dL (ref 8.7–10.3)
Chloride: 102 mmol/L (ref 96–106)
Creatinine, Ser: 1.04 mg/dL — ABNORMAL HIGH (ref 0.57–1.00)
GFR calc Af Amer: 67 mL/min/{1.73_m2} (ref >59)
GFR calc non Af Amer: 59 mL/min/{1.73_m2} — ABNORMAL LOW (ref >59)
Globulin, Total: 1.6 g/dL (ref 1.5–4.5)
Glucose: 120 mg/dL — ABNORMAL HIGH (ref 65–99)
Potassium: 4.3 mmol/L (ref 3.5–5.2)
Sodium: 140 mmol/L (ref 134–144)
Total Protein: 6.2 g/dL (ref 6.0–8.5)

## 2019-01-04 LAB — CBC WITH DIFFERENTIAL/PLATELET
Basophils Absolute: 0 10*3/uL (ref 0.0–0.2)
Basos: 0 %
EOS (ABSOLUTE): 0.1 10*3/uL (ref 0.0–0.4)
Eos: 2 %
Hematocrit: 40.7 % (ref 34.0–46.6)
Hemoglobin: 13.8 g/dL (ref 11.1–15.9)
Immature Grans (Abs): 0 10*3/uL (ref 0.0–0.1)
Immature Granulocytes: 0 %
Lymphocytes Absolute: 0.3 10*3/uL — ABNORMAL LOW (ref 0.7–3.1)
Lymphs: 7 %
MCH: 32.3 pg (ref 26.6–33.0)
MCHC: 33.9 g/dL (ref 31.5–35.7)
MCV: 95 fL (ref 79–97)
Monocytes Absolute: 0.3 10*3/uL (ref 0.1–0.9)
Monocytes: 7 %
Neutrophils Absolute: 3.7 10*3/uL (ref 1.4–7.0)
Neutrophils: 84 %
Platelets: 147 10*3/uL — ABNORMAL LOW (ref 150–450)
RBC: 4.27 x10E6/uL (ref 3.77–5.28)
RDW: 12.9 % (ref 11.7–15.4)
WBC: 4.5 10*3/uL (ref 3.4–10.8)

## 2019-01-07 ENCOUNTER — Telehealth: Payer: Self-pay | Admitting: *Deleted

## 2019-01-07 NOTE — Telephone Encounter (Signed)
-----   Message from Britt Bottom, MD sent at 01/04/2019 12:04 PM EDT ----- Please let the patient know that the lab work is ok --- the lymphocyte count is still low but higher than it was a couple months ago (common with [place])

## 2019-01-09 DIAGNOSIS — Z01419 Encounter for gynecological examination (general) (routine) without abnormal findings: Secondary | ICD-10-CM | POA: Diagnosis not present

## 2019-01-14 ENCOUNTER — Telehealth: Payer: Self-pay | Admitting: *Deleted

## 2019-01-14 NOTE — Telephone Encounter (Signed)
Faxed completed/signed Tysabri d/c questionnaire to [facility] at [phone]. Received fax confirmation.

## 2019-01-29 ENCOUNTER — Encounter: Payer: Self-pay | Admitting: Neurology

## 2019-01-29 DIAGNOSIS — Z23 Encounter for immunization: Secondary | ICD-10-CM | POA: Diagnosis not present

## 2019-01-30 ENCOUNTER — Telehealth: Payer: Self-pay | Admitting: *Deleted

## 2019-01-30 NOTE — Telephone Encounter (Signed)
Pt dropped off SD card for bipap download 01/29/19. Was able to download and print off compliance report. Gave to Dr. Felecia Shelling to review next week once he is back in the office.

## 2019-01-30 NOTE — Telephone Encounter (Signed)
Called pt to let her know she can come pick up SD card. I printed compliance reports for her. She will come by next week to pick up. She is aware machine will not be recording data if no SD card in machine. I placed up front for pick up in envelope.

## 2019-01-31 ENCOUNTER — Other Ambulatory Visit: Payer: Self-pay | Admitting: *Deleted

## 2019-01-31 DIAGNOSIS — G4733 Obstructive sleep apnea (adult) (pediatric): Secondary | ICD-10-CM

## 2019-01-31 NOTE — Progress Notes (Signed)
Called pt to let her know Dr. Felecia Shelling reviewed her bipap compliance report. Showing AHI 25.4. He wants to adjust pressure setting from 11/6 to 13/8. I faxed new orders to [employer] at [phone]. Received fax confirmation. Pt verbalized understanding.

## 2019-02-01 DIAGNOSIS — G4733 Obstructive sleep apnea (adult) (pediatric): Secondary | ICD-10-CM | POA: Diagnosis not present

## 2019-02-11 ENCOUNTER — Other Ambulatory Visit: Payer: Self-pay

## 2019-02-11 ENCOUNTER — Ambulatory Visit
Admission: RE | Admit: 2019-02-11 | Discharge: 2019-02-11 | Disposition: A | Discharge status: Home / Self Care | Payer: Medicare HMO | Source: Ambulatory Visit | Attending: Internal Medicine | Admitting: Internal Medicine

## 2019-02-11 DIAGNOSIS — Z1231 Encounter for screening mammogram for malignant neoplasm of breast: Secondary | ICD-10-CM | POA: Diagnosis not present

## 2019-02-11 IMAGING — MG MM DIGITAL SCREENING BILAT W/ TOMO W/ CAD
6 of 12 series · 6 of 36 positions shown · non-contrast
Comparison: Previous exam(s).

CLINICAL DATA: Screening.

EXAM:
DIGITAL SCREENING BILATERAL MAMMOGRAM WITH TOMO AND CAD

[L CC synth-2D (1 of 2)]
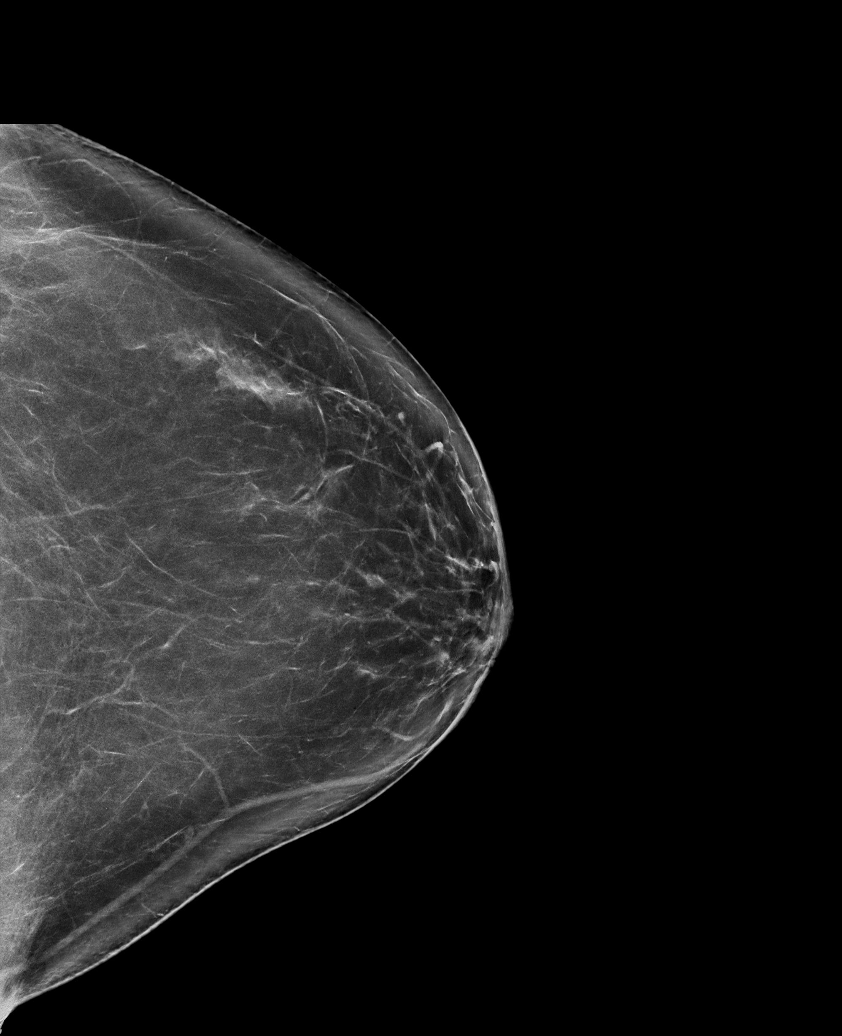

[R MLO synth-2D]
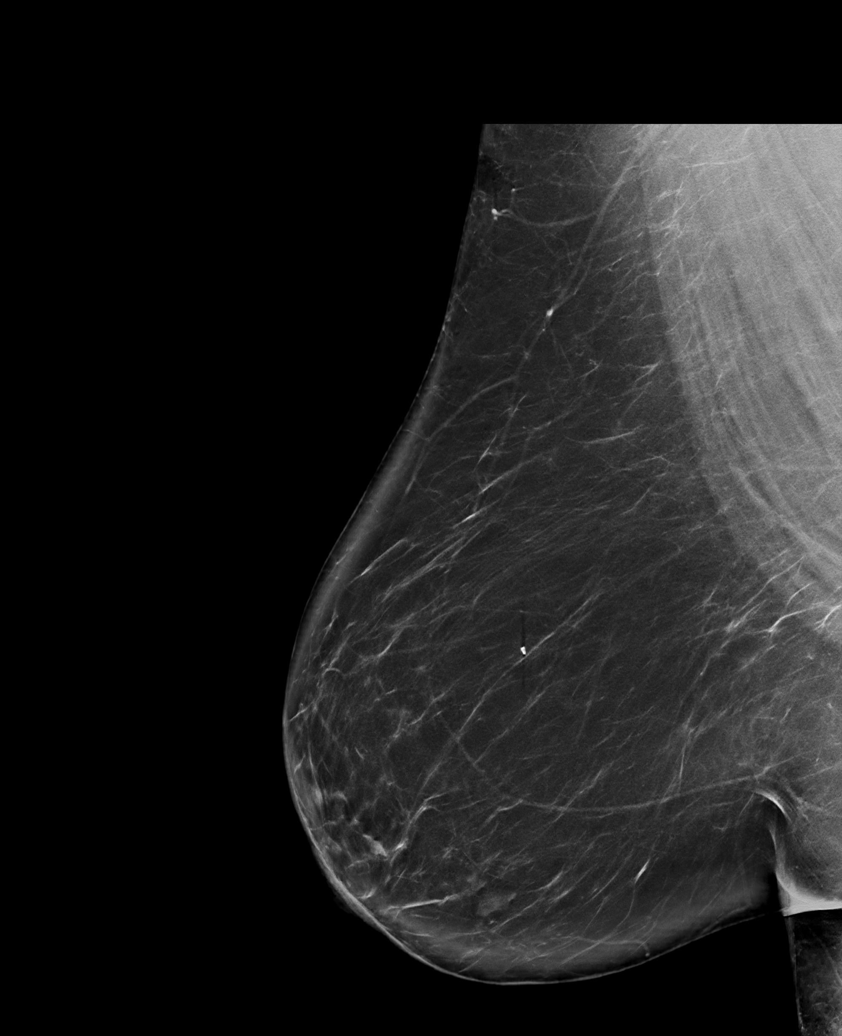

[R CC synth-2D (1 of 2)]
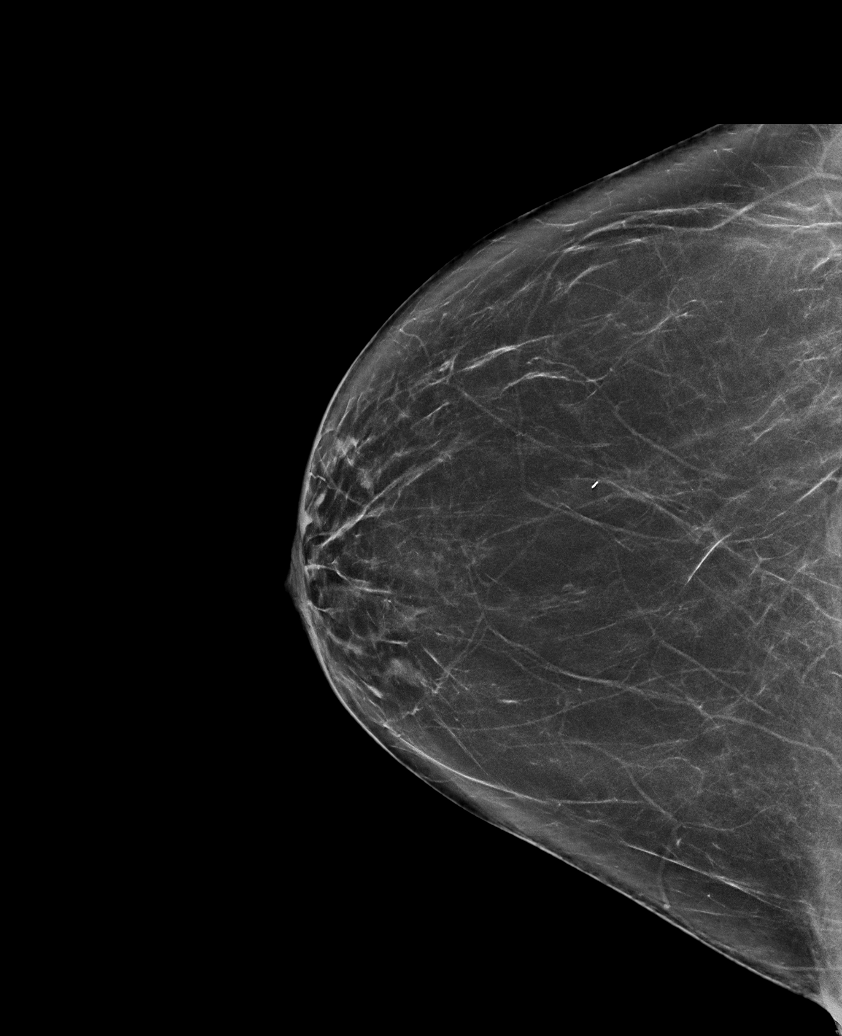

[L CC synth-2D (2 of 2)]
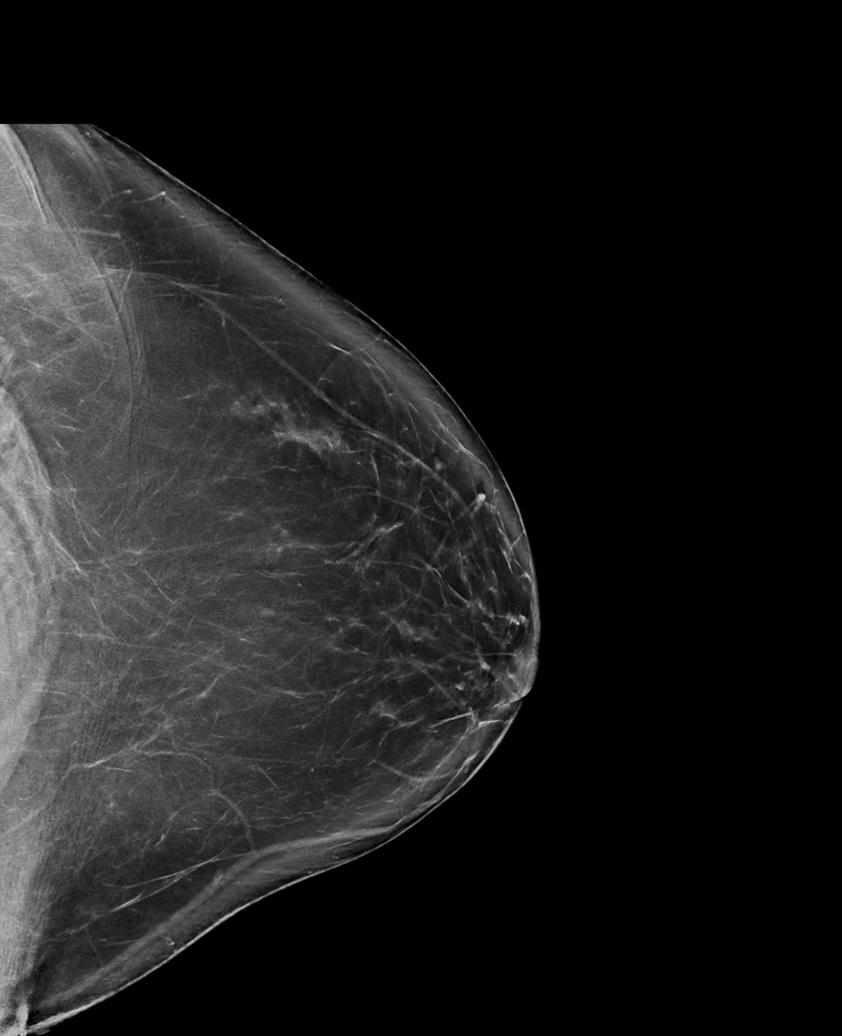

[L MLO synth-2D]
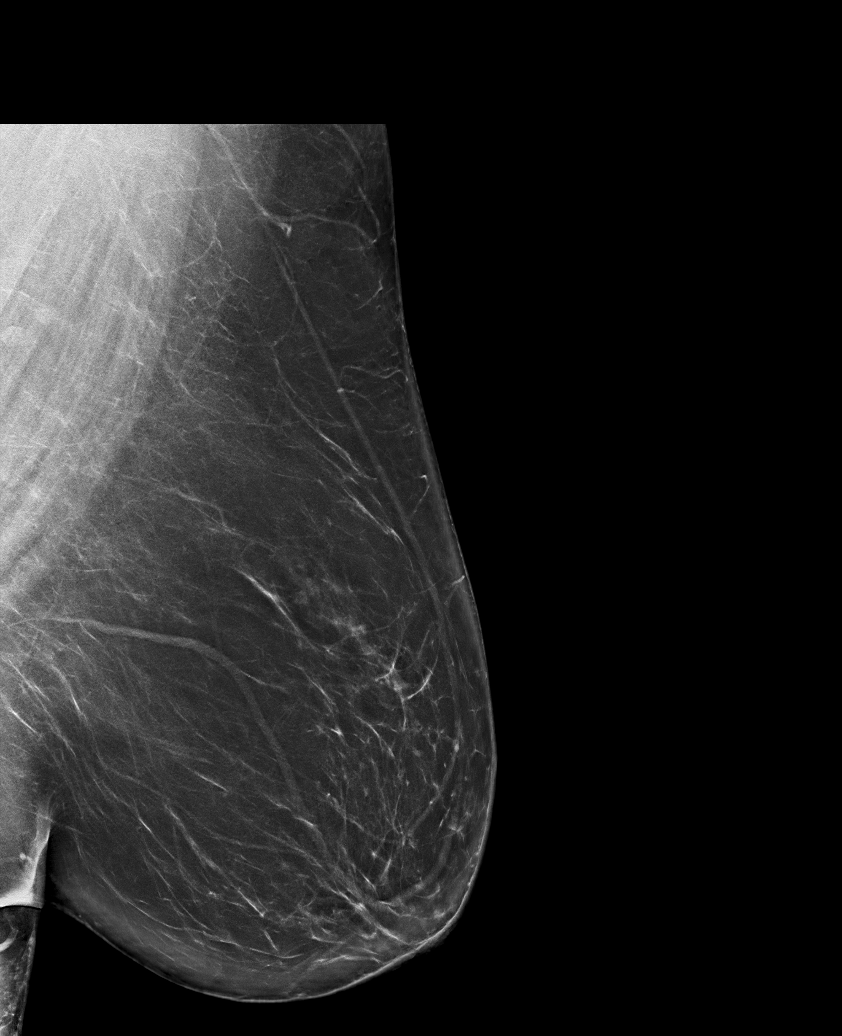

[R CC synth-2D (2 of 2)]
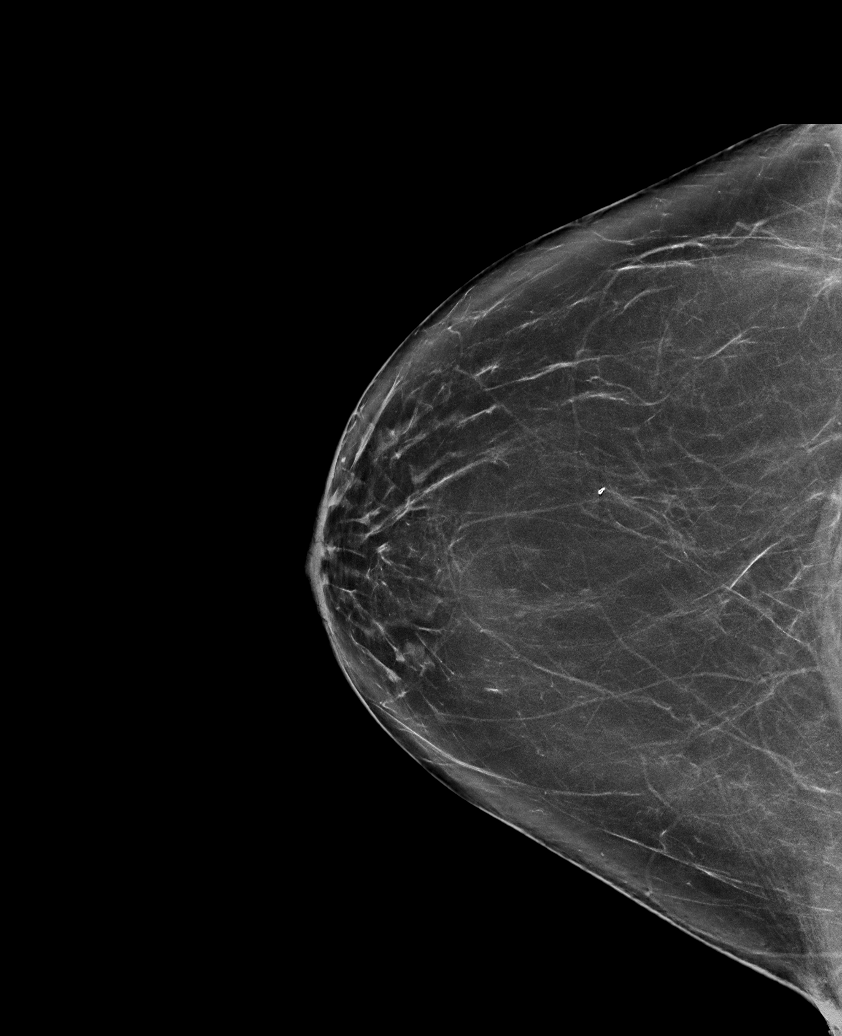

[6 of 36 positions shown; findings below may reference images not displayed]

ACR Breast Density Category b: There are scattered areas of
fibroglandular density.
FINDINGS: There are no findings suspicious for malignancy. Images were
processed with CAD.
IMPRESSION: No mammographic evidence of malignancy. A result letter of this
screening mammogram will be mailed directly to the patient.

RECOMMENDATION:
Screening mammogram in one year. (Code:[TQ])

BI-RADS CATEGORY  1: Negative.

## 2019-02-13 DIAGNOSIS — J4530 Mild persistent asthma, uncomplicated: Secondary | ICD-10-CM | POA: Diagnosis not present

## 2019-02-13 DIAGNOSIS — G43909 Migraine, unspecified, not intractable, without status migrainosus: Secondary | ICD-10-CM | POA: Diagnosis not present

## 2019-02-13 DIAGNOSIS — E782 Mixed hyperlipidemia: Secondary | ICD-10-CM | POA: Diagnosis not present

## 2019-02-13 DIAGNOSIS — G35 Multiple sclerosis: Secondary | ICD-10-CM | POA: Diagnosis not present

## 2019-02-22 ENCOUNTER — Other Ambulatory Visit: Payer: Self-pay | Admitting: Neurology

## 2019-02-27 DIAGNOSIS — E6609 Other obesity due to excess calories: Secondary | ICD-10-CM | POA: Diagnosis not present

## 2019-02-27 DIAGNOSIS — Z0000 Encounter for general adult medical examination without abnormal findings: Secondary | ICD-10-CM | POA: Diagnosis not present

## 2019-02-27 DIAGNOSIS — E782 Mixed hyperlipidemia: Secondary | ICD-10-CM | POA: Diagnosis not present

## 2019-02-27 DIAGNOSIS — J42 Unspecified chronic bronchitis: Secondary | ICD-10-CM | POA: Diagnosis not present

## 2019-02-27 DIAGNOSIS — G35 Multiple sclerosis: Secondary | ICD-10-CM | POA: Diagnosis not present

## 2019-02-27 DIAGNOSIS — L918 Other hypertrophic disorders of the skin: Secondary | ICD-10-CM | POA: Diagnosis not present

## 2019-02-27 DIAGNOSIS — G4733 Obstructive sleep apnea (adult) (pediatric): Secondary | ICD-10-CM | POA: Diagnosis not present

## 2019-03-28 ENCOUNTER — Other Ambulatory Visit: Payer: Self-pay | Admitting: Neurology

## 2019-04-05 DIAGNOSIS — G35 Multiple sclerosis: Secondary | ICD-10-CM

## 2019-04-11 DIAGNOSIS — H5213 Myopia, bilateral: Secondary | ICD-10-CM | POA: Diagnosis not present

## 2019-04-11 DIAGNOSIS — H5203 Hypermetropia, bilateral: Secondary | ICD-10-CM | POA: Diagnosis not present

## 2019-04-11 DIAGNOSIS — H52209 Unspecified astigmatism, unspecified eye: Secondary | ICD-10-CM | POA: Diagnosis not present

## 2019-04-25 ENCOUNTER — Telehealth: Payer: Self-pay | Admitting: *Deleted

## 2019-04-25 NOTE — Telephone Encounter (Signed)
Submitted PA Mavenclad on CMM. [id number]. Waiting on determination from [facility].

## 2019-04-25 NOTE — Telephone Encounter (Signed)
Received fax from [facility] that [place] approved until 05/22/2020.

## 2019-04-28 ENCOUNTER — Other Ambulatory Visit: Payer: Self-pay | Admitting: Neurology

## 2019-04-29 ENCOUNTER — Telehealth: Payer: Self-pay | Admitting: *Deleted

## 2019-04-29 NOTE — Telephone Encounter (Signed)
Took call from phone staff. Spoke with Caryl Pina with [employer]. They need start form resent with "year 2" checked. Also would like recent approval letter from [facility] forwarded. They also are resending clearance form to be filled out by MD. They are showing soonest pt can start year 2 is 06/03/19.

## 2019-05-02 DIAGNOSIS — R221 Localized swelling, mass and lump, neck: Secondary | ICD-10-CM | POA: Diagnosis not present

## 2019-05-02 DIAGNOSIS — L918 Other hypertrophic disorders of the skin: Secondary | ICD-10-CM | POA: Diagnosis not present

## 2019-05-02 NOTE — Telephone Encounter (Signed)
Faxed back signed form stating pt not ready to start [place]. Advised them to f/u with our office on 06/05/2019 by phone. Per Dr. Felecia Shelling, she has f/u that day and will be doing labs. [phone]. Received fax confirmation.

## 2019-05-03 DIAGNOSIS — G4733 Obstructive sleep apnea (adult) (pediatric): Secondary | ICD-10-CM | POA: Diagnosis not present

## 2019-05-13 ENCOUNTER — Ambulatory Visit
Admission: RE | Admit: 2019-05-13 | Discharge: 2019-05-13 | Disposition: A | Discharge status: Home / Self Care | Payer: Medicare HMO | Source: Ambulatory Visit | Attending: Neurology | Admitting: Neurology

## 2019-05-13 ENCOUNTER — Other Ambulatory Visit: Payer: Self-pay | Admitting: Internal Medicine

## 2019-05-13 DIAGNOSIS — G35 Multiple sclerosis: Secondary | ICD-10-CM | POA: Diagnosis not present

## 2019-05-13 DIAGNOSIS — R221 Localized swelling, mass and lump, neck: Secondary | ICD-10-CM

## 2019-05-13 MED ORDER — GADOBENATE DIMEGLUMINE 529 MG/ML IV SOLN
20.0000 mL | Freq: Once | INTRAVENOUS | Status: AC | PRN
  Administered 2019-05-13: 20 mL via INTRAVENOUS

## 2019-05-14 ENCOUNTER — Telehealth: Payer: Self-pay | Admitting: *Deleted

## 2019-05-14 NOTE — Telephone Encounter (Signed)
-----   Message from Britt Bottom, MD sent at 05/14/2019  1:20 PM EST ----- Please let him know that the MRI of the brain and cervical spine did not show any new lesions.  She does have the prior surgery in the neck and above and below the fusion there is mild spinal stenosis.  There does not appear to be nerve root compression.

## 2019-05-23 ENCOUNTER — Ambulatory Visit
Admission: RE | Admit: 2019-05-23 | Discharge: 2019-05-23 | Disposition: A | Discharge status: Home / Self Care | Payer: Medicare HMO | Source: Ambulatory Visit | Attending: Internal Medicine | Admitting: Internal Medicine

## 2019-05-23 DIAGNOSIS — R221 Localized swelling, mass and lump, neck: Secondary | ICD-10-CM

## 2019-05-23 DIAGNOSIS — E041 Nontoxic single thyroid nodule: Secondary | ICD-10-CM | POA: Diagnosis not present

## 2019-05-23 IMAGING — US US THYROID
1 series · 13 of 25 positions shown · non-contrast
Comparison: None.

CLINICAL DATA: Left neck mass

EXAM:
THYROID ULTRASOUND
TECHNIQUE: Ultrasound examination of the thyroid gland and adjacent soft
tissues was performed.

[Series 1: us thyroid · 0.06mm/px · 50 acquisitions, 13 frames shown]
[im 1/50]
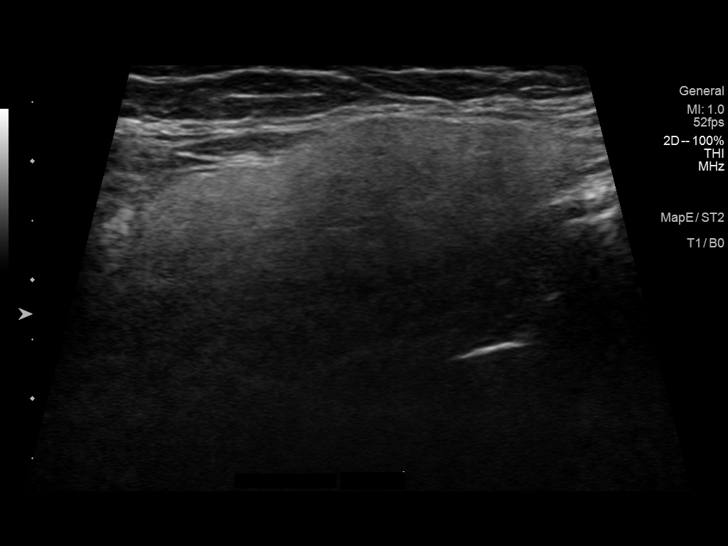
[im 5/50]
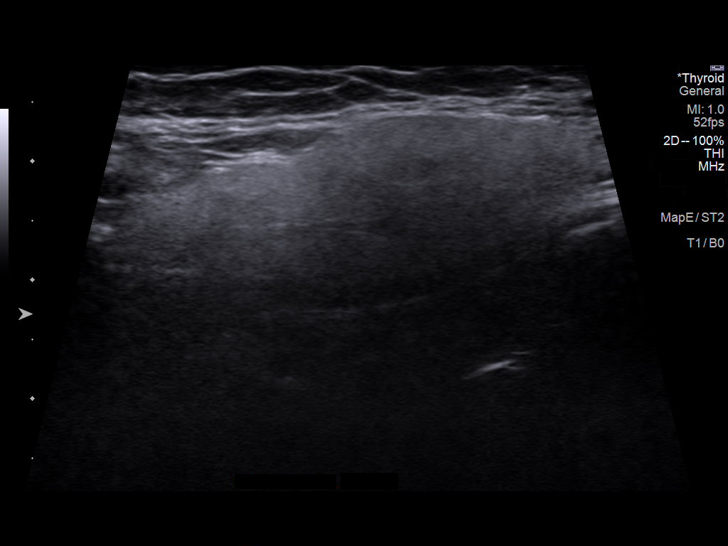
[im 9/50]
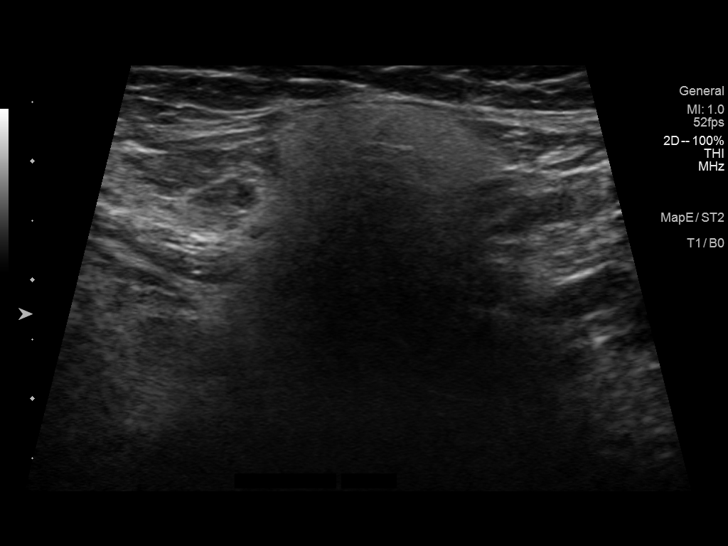
[im 13/50]
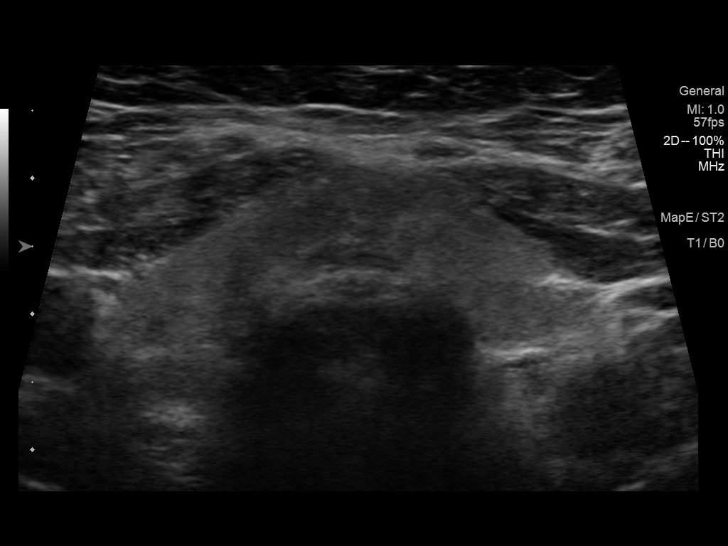
[im 17/50]
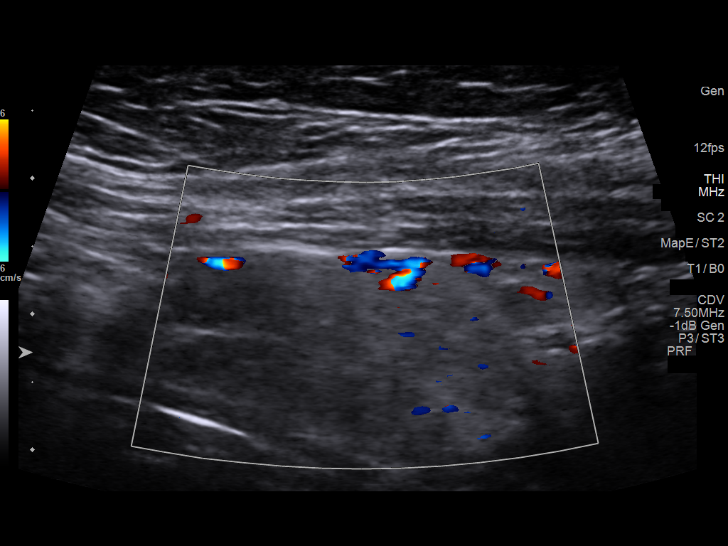
[im 21/50]
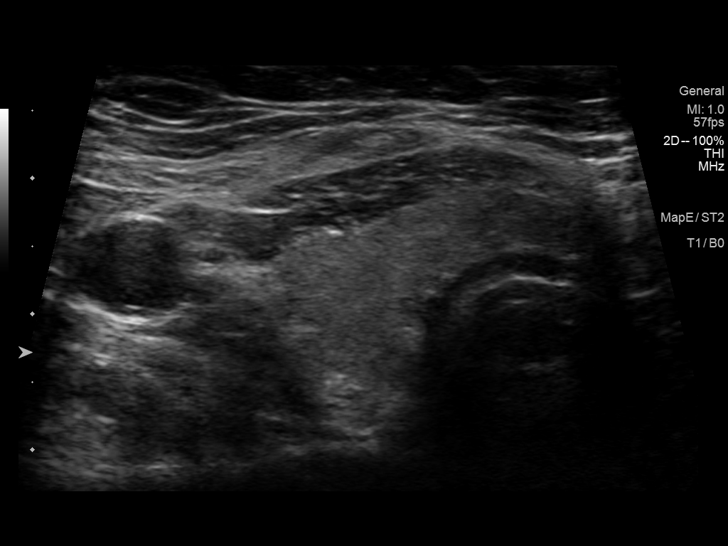
[im 25/50]
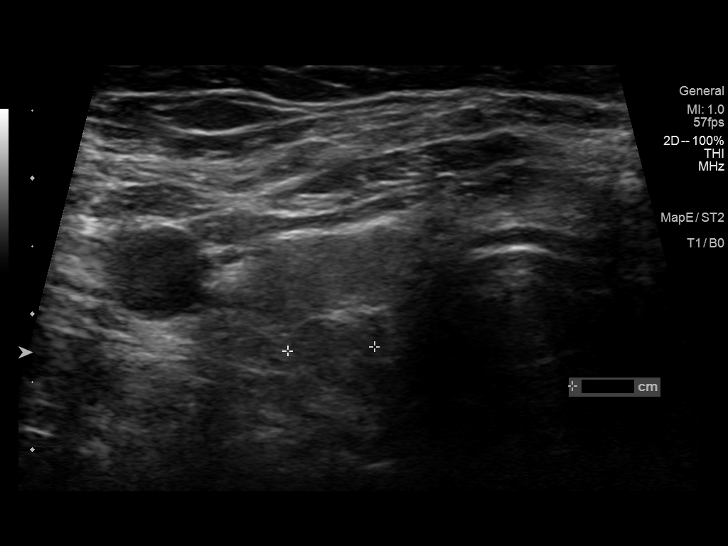
[im 29/50]
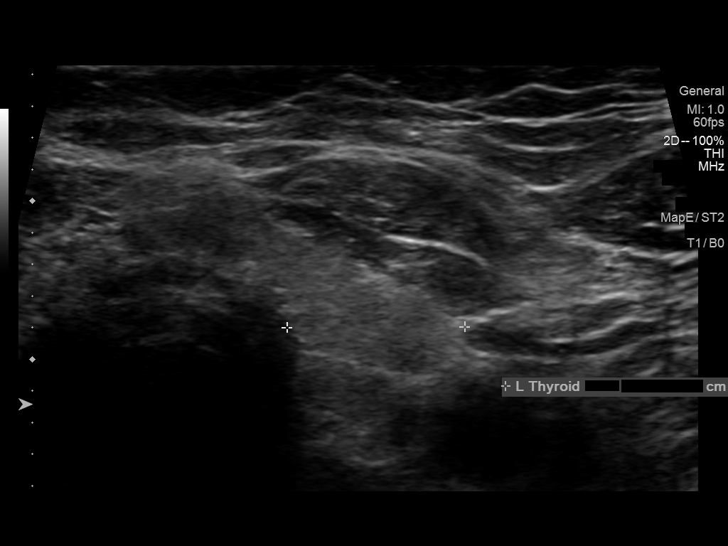
[im 33/50]
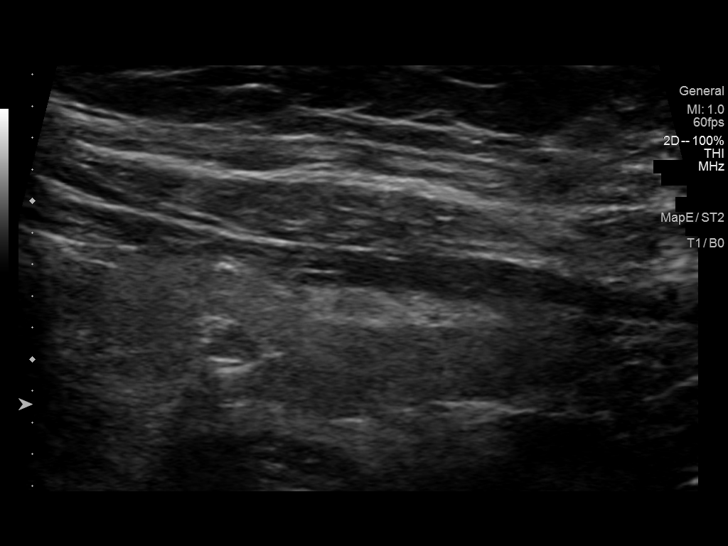
[im 37/50]
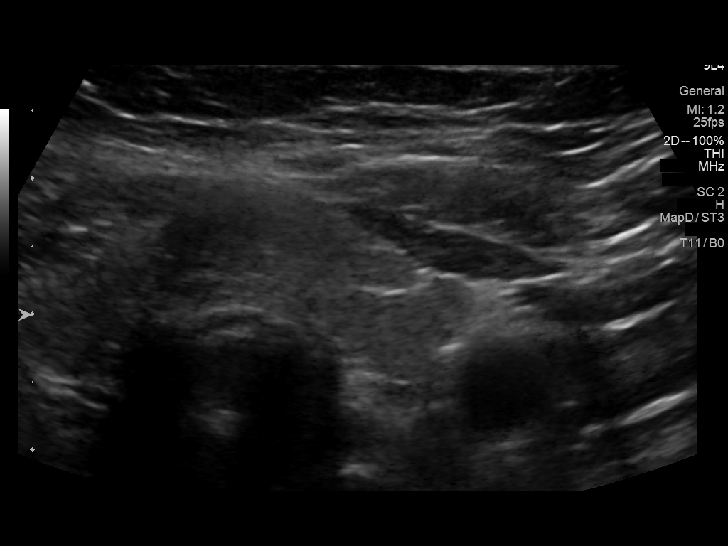
[im 41/50]
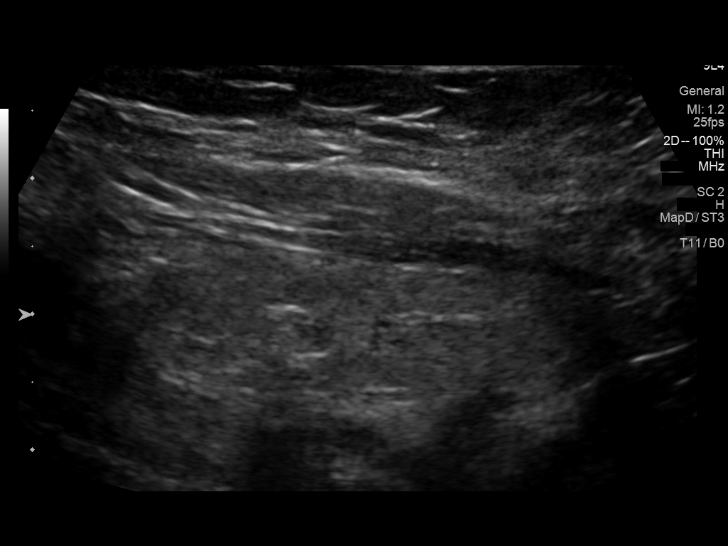
[im 45/50]
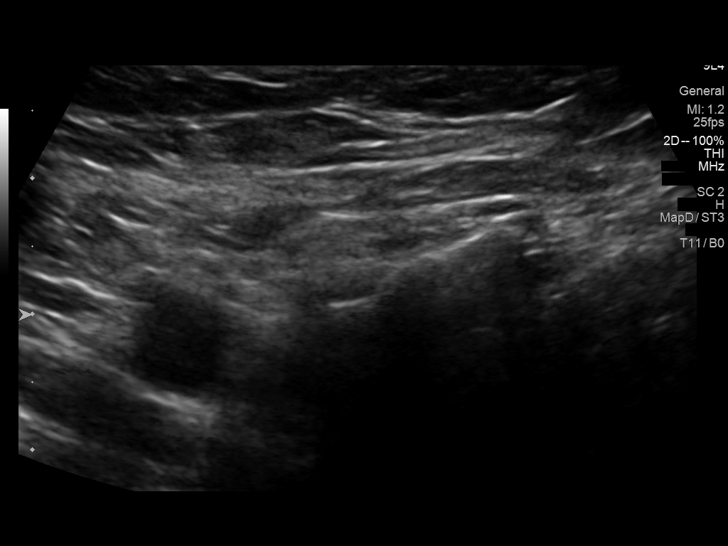
[im 50/50]
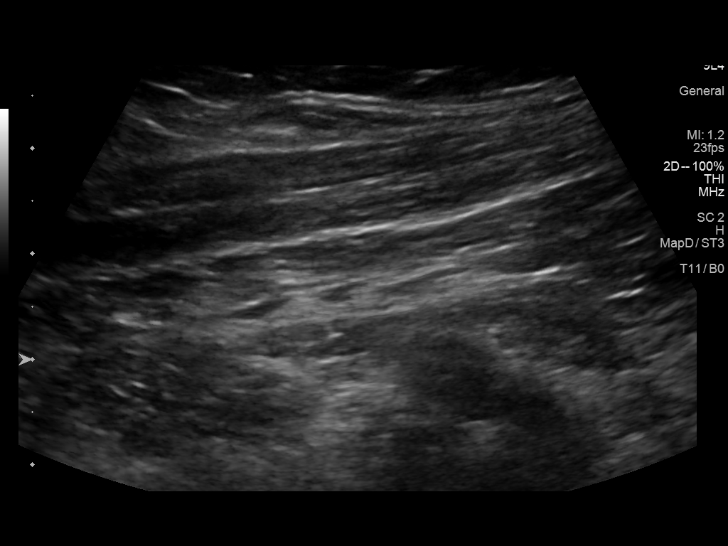

[13 of 25 positions shown; findings below may reference images not displayed]

FINDINGS: Parenchymal Echotexture: Mildly heterogenous

Isthmus: 0.6 cm

Right lobe: 2.9 x 1.5 x 1.5 cm

Left lobe: 3.1 x 1 x 1.1 cm

_________________________________________________________

Estimated total number of nodules >/= 1 cm: 1

Number of spongiform nodules >/=  2 cm not described below (TR1): 0

Number of mixed cystic and solid nodules >/= 1.5 cm not described
below (TR2): 0

_________________________________________________________

Nodule # 1:

Location: Right; Mid

Maximum size: 2 cm; Other 2 dimensions: 0.6 x 0.6 cm

Composition: solid/almost completely solid (2)

Echogenicity: hypoechoic (2)

Shape: not taller-than-wide (0)

Margins: ill-defined (0)

Echogenic foci: none (0)

ACR TI-RADS total points: 4.

ACR TI-RADS risk category: TR4 (4-6 points).

ACR TI-RADS recommendations:

**Given size (>/= 1.5 cm) and appearance, fine needle aspiration of
this moderately suspicious nodule should be considered based on
TI-RADS criteria.

_________________________________________________________

There are no additional thyroid nodules. There is no pathologically
enlarged lymph node.
IMPRESSION: 1. Mildly heterogeneous normal-sized thyroid gland.
2. Subtle, indistinct 2 cm right-sided thyroid nodule as detailed
above. The margins of this nodule are ill-defined and as such this
finding could represent a pseudo nodule versus true thyroid nodule.
While this nodule meets criteria for follow-up with FNA, a short
interval repeat thyroid ultrasound in 6 months can be considered
given findings suggesting this may simply represent a pseudo nodule.

The above is in keeping with the ACR TI-RADS recommendations - [HOSPITAL] [HU];[DATE].

## 2019-05-28 ENCOUNTER — Other Ambulatory Visit: Payer: Self-pay | Admitting: Internal Medicine

## 2019-05-28 ENCOUNTER — Telehealth: Payer: Self-pay | Admitting: *Deleted

## 2019-05-28 DIAGNOSIS — E041 Nontoxic single thyroid nodule: Secondary | ICD-10-CM

## 2019-05-28 NOTE — Telephone Encounter (Signed)
Faxed completed/signed [place] [employer] relief program form back at [phone]. Received fax confirmation.

## 2019-06-05 ENCOUNTER — Ambulatory Visit: Payer: Medicare HMO | Admitting: Neurology

## 2019-06-05 ENCOUNTER — Telehealth: Payer: Self-pay | Admitting: *Deleted

## 2019-06-05 ENCOUNTER — Other Ambulatory Visit: Payer: Self-pay

## 2019-06-05 ENCOUNTER — Encounter: Payer: Self-pay | Admitting: Neurology

## 2019-06-05 VITALS — BP 129/85 | HR 96 | Temp 97.5°F | Ht 64.0 in | Wt 212.0 lb

## 2019-06-05 DIAGNOSIS — G35 Multiple sclerosis: Secondary | ICD-10-CM | POA: Diagnosis not present

## 2019-06-05 DIAGNOSIS — R269 Unspecified abnormalities of gait and mobility: Secondary | ICD-10-CM | POA: Diagnosis not present

## 2019-06-05 DIAGNOSIS — Z79899 Other long term (current) drug therapy: Secondary | ICD-10-CM

## 2019-06-05 DIAGNOSIS — R5383 Other fatigue: Secondary | ICD-10-CM | POA: Diagnosis not present

## 2019-06-05 DIAGNOSIS — G4733 Obstructive sleep apnea (adult) (pediatric): Secondary | ICD-10-CM | POA: Diagnosis not present

## 2019-06-05 NOTE — Progress Notes (Signed)
[facility]  PATIENT: Helen Sandoval DOB: April 01, 1961  REFERRING DOCTOR OR PCP:  Otto Herb. (Neuro[facility])   Sinclair Ship (new PCP at [facility]) SOURCE: patient and records from [facility]  _________________________________   HISTORICAL  CHIEF COMPLAINT:  Chief Complaint  Patient presents with  . Follow-up    RM 12, alone. Last seen 01/03/2019.   . Multiple Sclerosis    On Mavenclad.   . Sleep Apnea    Uses Bipap. DME: [facility]    HISTORY OF PRESENT ILLNESS:  Helen Sandoval is a 59 yo woman who was diagnosed with MS in 2015.     Update 06/05/2019: Her MS is stable and no new symptoms.   She does feel a little more tired the past week.   Gait and strength are fine.   Sensation is doing well.   She has new glasses which helped vision.   Bladder function is good.     She has a little more fatigue, phentermine has helped.   She sleeps well most nights.   No mood issues.   She lives with her mom (age 33).   The mom gets her Covid 59 vaccination tomorrow  She had her two courses of [place] in February and March 2020 and tolerated them well.    She is wanting to continue and take the second year soon.   Her lymphocyte counts were low at 0.3 at 6 months (0.2 at 2 months).     She did have an ultrasound and has two small masses one that is solid may undergo a fine needle aspiration.     She had her shingles vaccination in November and December and we discussed getting the Covid 19 vaccination --- may want to hold off on second year until after she has done so (10 days after 2nd shot)  She has headaches located in forehead/temple region.    These occur near daily.     No nausea or photophobia.   Ibuprofen helps.     Update 01/03/2019: She is 6 months out from her first dose of Mavenclad.  At month 2, the lymphocyte count was 0.2.  Platelet count was 135.  We will repeat blood work today.  She denies any definite exacerbations though does have a list of  medical complaints.  She feels her balance is worse and she has 3 falls since her last visit.     She notes more confusion at times.   Insomnia is worse.   She also notes more depression.  She also notes more fatigue.   She has been taking more naps.   She has OSA and uses BiPAP.  Her sister tells her she snores through her mask.   Of note, weight is increased.    She has more symptoms including headaches, joint pain and increased sweating.     She notes more depression.   Viibryd was prescribed but poorly covered by insurance so she stayed on Effexor 300 mg .   She notes anxiety as well.    Update 08/29/2018: She had her 1st and 2nd months of [place] in February and March.   She noted mild headache and fatigue the weeks she took the [facility].   Headaches and fatigue are now better.    Gait is doing well with some imbalance but no falls.   Strength and sensation are unchanged.   Her urinary urge and urge incontinence are much better with Myrbetriq.    Mood is ok.  She has mild stable depression.       She is sleeping well at night taking trazodone.  She is on CPAP for OSA.     Adderall has not helped fatigue or focus/attention .    Update 07/05/2018: She feels she is doing about the same as she was at her last visit with no new MS exacerbations.  She does feel more fatigued.  Her gait does fairly well and she is able to walk a mile and keep up with her friends.  She denies significant weakness or spasticity.  She does note some numbness at times on the left.  She has reduced color vision out of the left eye and mildly more blurry vision out of that eye.  She has mild bladder symptoms that are well controlled by medications.  Her main problems are fatigue and cognitive/attention.  She has had some difficulty with verbal fluency and with executive function such as following recipes.   Today, she will be starting [place] and her drug is been shipped to her.  She will be followed in the MASTER  study.   Update1/10/2018 Her MS has been stable.  Unfortunately, her risk of PML has increased.  Her JCV Ab titer increased to 2.32 and I discussed changes in therapy.    She is here today to discuss in more detail.   Her last Tysabri was late December.  She had MRI of the brain and spine performed towards the end of December.  I personally reviewed the images with her today.  The MRI of the brain shows multiple T2/flair hyperintense foci in a stable pattern consistent with chronic demyelination (probably with chronic microvascular ischemic change mixed in).  MRI of the cervical spine did not show any new lesions in the spine.  She does have some degenerative changes above and below her fusion.  She continues to report some pain going down the left arm.  Since September, she reports a sinus infection.  She was treated with doxycycline and a Z-pak.   She is taking guafinescan and trying to reduce smoking (now 1/4 ppd).   She has Dulera and ventolin.   She was told a CXR was normal.  She has not had PFT's.    MRI was reviewed and no definite sinusitis noted.  We had a long conversation about different disease modifying therapies for her MS. She was interested in [place], [place] or [place].   Because she appears to have frequent respiratory infections, O[place]us may have more risk for her.  We went into details about [place] and [place] and she is potentially interested in these and will think about this more over the next week while we are waiting for blood work to return.  Update 04/30/2018: She is on Tysabri every 6 weeks (had low positive JCV antibody 0.61).  She tolerates it well and has not had any exacerbations.  However, she felt much better when she took it every 4 weeks experiencing less joint aches and fatigue.   She also has more headaches and more tremors.     Her HA are starting in her neck and they radiate up.       Her gait is doing well.   Occasional stumbles but no falls to the ground.     Bladder is doing well.   Vision is ok.   She has had a little vertigo with mild diplopia at times    She was looking outside and thought she saw squirrels.  She has difficulty focusing while driving.   She is sleepy and will doze off some afternoons and evenings.  Her insomnia is doing well.  She has OSA and uses BiPAP.   For ADD and sleepiness symptoms, she takes Adderall 10 mg po tid.     Update 11/09/2017: She is on Tysabri.  She tolerates the infusions well.   Her last JCV antibody was 04/25/2017.  It was indeterminate at 0.28.  However, the inhibition assay was positive.  This would imply a risk of Tysabri possibly in the 1: 2000 range.  Most of her symptoms are mostly the same.  However, she notes a little more difficulty with her balance and she has had a couple falls.  One fall hurt her knee and she had fluid drained.   Both falls were forward.   She did not trip.   She hit her head with the one fall but no LOc.   She has some difficulty with vision.  Bladder function is fine.   She has fast low amplitude tremor, worse with intention.  Her mom had a tremor.    Her fatigue has done fairly well.  She is sleeping well with BiPAP.  She has had excellent compliance and efficacy on download.  She denies any significant depression or anxiety.  She notes mild cognitive problems like reduced focus and attention, STM and processing.  Adderall has helped the fatigue and the attentional deficits.   She takes 20-30 mg daily.   She has tramadol but tries to avoid it if she can.     She is also on Wellbutrin and knows not to take more than 2 tramadol's in any 24-hour period.   She has noted some neck pain and also     She notes more sweating  Update 05/11/2017: She feels that her MS has been mostly stable. She continues on Tysabri and tolerates it well. Last JCV antibody showed a titer of 0.28 and the inhib assay was positive. This is similar to her previous one from last year. She notes that the gait is  stable. She did have one fall and hitting a coffee table. She notes slight weakness in the right leg. There is no numbness. Bladder function is fine. She does note that vision is doing worse and she plans on seeing her ophthalmologist.  She feels that her fatigue is doing well. She is on BiPAP and sleeps well most nights. She brought in her chip in the download shows 90% compliance with at greater than 4 hours at night. The AHI was 5.8 the feels that mood is doing well on her current regimen. She doesn't note any significant cognitive problems. Adderall has helped her fatigue and focus and attention. She takes tramadol now and then for her leg pain. She never takes more than 2 in any day. We discussed that the combination of tramadol and Wellbutrin can increased risk of seizures that she should never take more than 2 tramadol any day.   From 11/09/2016: MS:   She has noted more trouble with her vision but otherwise feels about the same as the last visit. Marland Kitchen   MRI of the spine and brain 05/2016 comparedt to 2016 MRI is essentially unchanged.   The MRI of the cervical spine shows C4-C6 ACDF and has 2-3 small spine spots.   She is on Tysabri. She tolerates it well and has not had any exacerbations on it. Her JCV antibody status was indeterminate at last check (0.33  05/11/2016)   Gait/strength/sensation:   Her gait is stable but she feels off balance at times. She has had a few falls hurting her knee with a recent one. Her right leg feels mildly weaker than the left leg and she drags it when she is tired.   She denies any paresthesias.  Bladder/bowel:     Her urinary urgency and incontinence is doing better with Myrbetriq   No recent bowel incontinence.  Vision:   She feels vision is worse and she notes more diplopia and blurry vision.  This occurs randomly, not just if hot or tired.     . No eye pain.    Fatigue/sleep:   She feels that her fatigue is doing fairly well. Is mild most days but worse with heat  and if she has a more active day. The fatigue is both physical and cognitive. She is sleeping fairly well most nights.    Adderall helps fatigue and also slightly helps cognitive issues.    She has OSA and is on BiPAP (Since 2012)   She was told OSA was severe.     She uses BiPAP every night for > 4 hours.        She denies excessive daytime sleepiness but has fatigue.   Her mom noted that shsnored some through her mask.   No recent download but compliance download  11/08/15 shows excellent compliance of 97% and efficacy with AHI only 3.2 on BiPAP 11/6.  Mood/cognition:  Effexor and Wellbutrin helped mood at first but she was more depressed last year and she changed Effexor to [place] is mild.   She notes difficulty with focus and has had some difficulty with verbal fluency and short term memory.      Adderall only helps a little bit.  MS History:   In 2008, she had headaches and poor balance and was told the MRI had some white matter foci at that time.    She had spinal stenosis and needed surgery in 2014.  The surgeon felt her gait and other issues were decreased out of proportion to the extent of spine disease and referred her to Neurology.   Besides gait issues, she also had double vision, blurry vision, cognitive difficulty and incontinence in early 2015. She saw Dr. Pamalee Leyden in [place]. An MRI of the brain, CSF and visual evoked potentials were consistent with multiple sclerosis.    Initially, she was placed on Tecfidera. Due to progression with more neurologic symptoms, she was switched to Tysabri.   Her last MRI was done 10/2014 but we do not have the images.     REVIEW OF SYSTEMS: Constitutional: No fevers, chills, sweats, or change in appetite.   She notes fatigue Eyes: Reports visual changes and double vision.  No eye pain Ear, nose and throat: No hearing loss, ear pain, nasal congestion, sore throat Cardiovascular: No chest pain, palpitations Respiratory: No shortness of breath at  rest or with exertion.   She has snoring and coughing but no wheezes GastrointestinaI: No nausea, vomiting, abdominal pain.  She notes diarrhea and some fecal incontinence Genitourinary: No dysuria, urinary retention or frequency. She has had some incontinence Musculoskeletal: No neck pain, back pain Integumentary: No rash, pruritus, skin lesions Neurological: as above Psychiatric: Some depression at this time.  Some anxiety Endocrine: No palpitations, diaphoresis, change in appetite, change in weigh or increased thirst Hematologic/Lymphatic: No anemia, purpura, petechiae. Allergic/Immunologic: No itchy/runny eyes, nasal congestion, recent allergic reactions, rashes  ALLERGIES: Allergies  Allergen Reactions  . Clindamycin Anaphylaxis  . Indomethacin Hives  . Levofloxacin Hives  . Penicillins Anaphylaxis  . Penicillins Cross Reactors Anaphylaxis  . Sulfa Antibiotics Swelling    Blister (steven john syndrome)  . Levaquin [Levofloxacin Hemihydrate] Hives  . Lipitor  [Atorvastatin Calcium]   . Sulfamethoxazole-Trimethoprim   . Zocor [Simvastatin] Other (See Comments)    Other reaction(s): MUSCLE PAIN Muscle aches    HOME MEDICATIONS:  Current Outpatient Medications:  .  alendronate (FOSAMAX) 70 MG tablet, Take 70 mg by mouth once a week. Take with a full glass of water on an empty stomach., Disp: , Rfl:  .  aspirin EC 81 MG tablet, Take 81 mg by mouth every other day.  , Disp: , Rfl:  .  Biotin 10000 MCG TABS, Take by mouth daily. , Disp: , Rfl:  .  buPROPion (WELLBUTRIN SR) 150 MG 12 hr tablet, Take 150 mg by mouth 2 (two) times daily. , Disp: , Rfl:  .  busPIRone (BUSPAR) 15 MG tablet, Take 1 tablet (15 mg total) by mouth 2 (two) times daily., Disp: 60 tablet, Rfl: 5 .  Chlorphen-PE-Acetaminophen (NOREL AD) 4-10-325 MG TABS, Take 1 tablet by mouth as needed., Disp: , Rfl:  .  cholecalciferol (VITAMIN D) 1000 UNITS tablet, Take 5,000 Units by mouth daily.  , Disp: , Rfl:  .   Cladribine (MAVENCLAD, 9 TABS, PO), Take by mouth., Disp: , Rfl:  .  co-enzyme Q-10 30 MG capsule, Take 30 mg by mouth daily. , Disp: , Rfl:  .  cyclobenzaprine (FLEXERIL) 5 MG tablet, Take 1 tablet by mouth three times daily as needed, Disp: 90 tablet, Rfl: 5 .  DULERA 200-5 MCG/ACT AERO, Inhale 2 puffs into the lungs daily. , Disp: , Rfl:  .  estradiol (CLIMARA - DOSED IN MG/24 HR) 0.0375 mg/24hr patch, estradiol 0.0375 mg/24 hr weekly transdermal patch, Disp: , Rfl:  .  ezetimibe (ZETIA) 10 MG tablet, Take 10 mg by mouth daily. , Disp: , Rfl:  .  gabapentin (NEURONTIN) 300 MG capsule, TAKE 1 CAPSULE BY MOUTH THREE TIMES DAILY, Disp: 270 capsule, Rfl: 3 .  Krill Oil 1000 MG CAPS, Take 1,000 mg by mouth daily. , Disp: , Rfl:  .  MYRBETRIQ 50 MG TB24 tablet, Take 1 tablet by mouth once daily, Disp: 90 tablet, Rfl: 0 .  omeprazole (PRILOSEC) 20 MG capsule, Take 20 mg by mouth daily. , Disp: , Rfl:  .  phentermine 37.5 MG capsule, Take 1 capsule by mouth in the morning, Disp: 30 capsule, Rfl: 5 .  traZODone (DESYREL) 100 MG tablet, Take 1 tablet (100 mg total) by mouth at bedtime., Disp: 90 tablet, Rfl: 4 .  TURMERIC PO, Take 1 capsule by mouth every morning. , Disp: , Rfl:  .  venlafaxine XR (EFFEXOR XR) 150 MG 24 hr capsule, Take 2 capsules by mouth daily., Disp: , Rfl:  .  valACYclovir (VALTREX) 500 MG tablet, Take 1 tablet (500mg ) twice daily for the next 8 weeks (Patient not taking: Reported on 01/03/2019), Disp: 60 tablet, Rfl: 1  PAST MEDICAL HISTORY: Past Medical History:  Diagnosis Date  . Ankle fracture   . Osteopenia   . Scarlet fever   . Shingles   . Sleep apnea   . Spina bifida   . Vitamin D deficiency     PAST SURGICAL HISTORY: Past Surgical History:  Procedure Laterality Date  . ABDOMINAL HYSTERECTOMY      FAMILY HISTORY: No  family history on file.  SOCIAL HISTORY:  Social History   Socioeconomic History  . Marital status: Divorced    Spouse name: Not on file   . Number of children: Not on file  . Years of education: Not on file  . Highest education level: Not on file  Occupational History  . Not on file  Tobacco Use  . Smoking status: Current Some Day Smoker  . Smokeless tobacco: Never Used  Substance and Sexual Activity  . Alcohol use: No  . Drug use: No  . Sexual activity: Not on file  Other Topics Concern  . Not on file  Social History Narrative  . Not on file   Social Determinants of Health   Financial Resource Strain:   . Difficulty of Paying Living Expenses: Not on file  Food Insecurity:   . Worried About Charity fundraiser in the Last Year: Not on file  . Ran Out of Food in the Last Year: Not on file  Transportation Needs:   . Lack of Transportation (Medical): Not on file  . Lack of Transportation (Non-Medical): Not on file  Physical Activity:   . Days of Exercise per Week: Not on file  . Minutes of Exercise per Session: Not on file  Stress:   . Feeling of Stress : Not on file  Social Connections:   . Frequency of Communication with Friends and Family: Not on file  . Frequency of Social Gatherings with Friends and Family: Not on file  . Attends Religious Services: Not on file  . Active Member of Clubs or Organizations: Not on file  . Attends Archivist Meetings: Not on file  . Marital Status: Not on file  Intimate Partner Violence:   . Fear of Current or Ex-Partner: Not on file  . Emotionally Abused: Not on file  . Physically Abused: Not on file  . Sexually Abused: Not on file     PHYSICAL EXAM  Vitals:   06/05/19 1525  BP: 129/85  Pulse: 96  Temp: (!) 97.5 F (36.4 C)  Weight: 212 lb (96.2 kg)  Height: 5\' 4"  (1.626 m)    Body mass index is 36.39 kg/m.   General: The patient is well-developed and well-nourished and in no acute distress  Neck: The neck is supple with good ROM and nontender.      Neurologic Exam  Mental status: The patient is alert and oriented x 3 at the time of the  examination.  During the interaction today, she has apparent normal recent and remote memory, with an apparently normal attention span and concentration ability.   Speech is normal.  Cranial nerves: Extraocular movements appear normal.  Facial strength was normal.  Trapezius strength is normal.  No dysarthria is noted.  No obvious hearing deficits are noted.  Motor:  Muscle bulk is normal.   Tone is normal. Strength is  5 / 5 in all 4 extremities.   Sensory: She reports slightly reduced sensation to vibration in the left foot but symmetric at knee.    Coordination:  finger-nose-finger is performed well.  Heel-to-shin is performed well.  Gait and station: Station is normal.  Gait is minimally wide and the tandem gait is mildly wide.  Romberg sign is negative.  Reflexes: Deep tendon reflexes are symmetric and 2+ arms and 3+ knees with spread.   There is no ankle clonus..    ASSESSMENT AND PLAN    1. Multiple sclerosis ([place])   2. High risk  medication use   3. Gait disturbance   4. OSA treated with BiPAP   5. Other fatigue     1.  Her MS appears to be stable.  She is due for her second year of [place].  We had a discussion about the COVID-19 vaccination.  It is possible that the vaccination would be less effective if she gets it shortly after starting [place].  Therefore, it is reasonable to have her get the vaccination (second course) and hold off on the [place] until 10 to 14 days later.  We will go ahead and check blood work for the chronic infections that need to be ruled out. 2.   Mood is stable on the current medications.. 3.   Continue BiPAP.  She uses it nightly.   4.   Continue Phentermine 37.5 mg in the morning for sleepiness, weight loss and fatigue.  Return in 3 months or sooner if there are new or worsening neurologic symptoms.   Richard A. Felecia Shelling, MD, PhD [id number], [id number] PM Certified in Neurology, Clinical Neurophysiology, Sleep Medicine, Pain Medicine and  Neuroimaging . [facility] [address], [place], [place] [address] [phone]

## 2019-06-05 NOTE — Telephone Encounter (Signed)
Placed JCV lab in [facility] lock box for routine lab pick up. Results pending. 

## 2019-06-06 ENCOUNTER — Telehealth: Payer: Self-pay | Admitting: *Deleted

## 2019-06-06 LAB — COMPREHENSIVE METABOLIC PANEL
ALT: 37 IU/L — ABNORMAL HIGH (ref 0–32)
AST: 28 IU/L (ref 0–40)
Albumin/Globulin Ratio: 2.7 — ABNORMAL HIGH (ref 1.2–2.2)
Albumin: 4.6 g/dL (ref 3.8–4.9)
Alkaline Phosphatase: 64 IU/L (ref 39–117)
BUN/Creatinine Ratio: 16 (ref 12–28)
BUN: 16 mg/dL (ref 8–27)
Bilirubin Total: 0.6 mg/dL (ref 0.0–1.2)
CO2: 23 mmol/L (ref 20–29)
Calcium: 9.3 mg/dL (ref 8.7–10.3)
Chloride: 101 mmol/L (ref 96–106)
Creatinine, Ser: 0.98 mg/dL (ref 0.57–1.00)
GFR calc Af Amer: 73 mL/min/{1.73_m2} (ref >59)
GFR calc non Af Amer: 63 mL/min/{1.73_m2} (ref >59)
Globulin, Total: 1.7 g/dL (ref 1.5–4.5)
Glucose: 101 mg/dL — ABNORMAL HIGH (ref 65–99)
Potassium: 4 mmol/L (ref 3.5–5.2)
Sodium: 142 mmol/L (ref 134–144)
Total Protein: 6.3 g/dL (ref 6.0–8.5)

## 2019-06-06 LAB — CBC WITH DIFFERENTIAL/PLATELET
Basophils Absolute: 0 10*3/uL (ref 0.0–0.2)
Basos: 0 %
EOS (ABSOLUTE): 0.1 10*3/uL (ref 0.0–0.4)
Eos: 1 %
Hematocrit: 39.5 % (ref 34.0–46.6)
Hemoglobin: 13.7 g/dL (ref 11.1–15.9)
Immature Grans (Abs): 0 10*3/uL (ref 0.0–0.1)
Immature Granulocytes: 1 %
Lymphocytes Absolute: 0.7 10*3/uL (ref 0.7–3.1)
Lymphs: 11 %
MCH: 32.8 pg (ref 26.6–33.0)
MCHC: 34.7 g/dL (ref 31.5–35.7)
MCV: 95 fL (ref 79–97)
Monocytes Absolute: 0.4 10*3/uL (ref 0.1–0.9)
Monocytes: 7 %
Neutrophils Absolute: 4.7 10*3/uL (ref 1.4–7.0)
Neutrophils: 80 %
Platelets: 145 10*3/uL — ABNORMAL LOW (ref 150–450)
RBC: 4.18 x10E6/uL (ref 3.77–5.28)
RDW: 12.7 % (ref 11.7–15.4)
WBC: 5.9 10*3/uL (ref 3.4–10.8)

## 2019-06-06 LAB — THYROID PANEL WITH TSH
Free Thyroxine Index: 1.1 — ABNORMAL LOW (ref 1.2–4.9)
T3 Uptake Ratio: 21 % — ABNORMAL LOW (ref 24–39)
T4, Total: 5.3 ug/dL (ref 4.5–12.0)
TSH: 6.22 u[IU]/mL — ABNORMAL HIGH (ref 0.450–4.500)

## 2019-06-06 LAB — HEPATITIS B SURFACE ANTIBODY,QUALITATIVE: Hep B Surface Ab, Qual: NONREACTIVE

## 2019-06-06 LAB — HIV ANTIBODY (ROUTINE TESTING W REFLEX): HIV Screen 4th Generation wRfx: NONREACTIVE

## 2019-06-06 LAB — HEPATITIS B SURFACE ANTIGEN: Hepatitis B Surface Ag: NEGATIVE

## 2019-06-06 LAB — HEPATITIS B CORE ANTIBODY, TOTAL: Hep B Core Total Ab: NEGATIVE

## 2019-06-06 NOTE — Telephone Encounter (Signed)
Faxed back clearance to [employer] stating pt can start [place] immediately per Dr. Felecia Shelling. [phone]. Received fax confirmation.

## 2019-06-10 NOTE — Telephone Encounter (Signed)
(  Pharmacy tech)[email or web address] has called asking to verify the prescription for [facility] , the call can be returned to [phone]

## 2019-06-10 NOTE — Telephone Encounter (Signed)
[place] back to verify dose for Mavenclad. Spoke with Juliann Pulse. She placed me on hold but called dropped on their end after holding for several min. I called right back. Pt [id number] Spoke with Oris Drone. She placed me on hold to have me speak with technician. Spoke with [employer]. He transferred me to pharmacist, Vicente Serene. I verified dosing below. He verbalized understanding and nothing further needed.  Month one: 10mg  tablet (2 tabs/day for days 1-4, and 1 tab on day 5.  Total tabs: 9 Month two: 10mg  tablet (2tabs/day for days 1-3, 1 tab/day days 4-5). Total tabs: 8

## 2019-06-12 ENCOUNTER — Telehealth: Payer: Self-pay | Admitting: *Deleted

## 2019-06-12 NOTE — Telephone Encounter (Signed)
I called pt and relayed results per Dr. Felecia Shelling note. She verbalized understanding.  Forwarded results to PCP and instructed pt to f/u with them about abnormal thyroid labs.

## 2019-06-12 NOTE — Telephone Encounter (Signed)
-----   Message from Britt Bottom, MD sent at 06/12/2019  1:54 PM EST ----- Please let her know that the lab work showed that she might have a mild hypothyroidism but was otherwise fine.  If she wants we can send the lab results to her primary care provider.  The labs for [facility] were fine and she should be able to take her next dose about 2 weeks after her second shot for the COVID-19  vaccination

## 2019-06-12 NOTE — Telephone Encounter (Signed)
JCV ab drawn on 06/05/19 positive, index: 0.75

## 2019-06-20 ENCOUNTER — Other Ambulatory Visit: Payer: Self-pay | Admitting: Neurology

## 2019-06-24 ENCOUNTER — Other Ambulatory Visit: Payer: Self-pay | Admitting: *Deleted

## 2019-06-24 MED ORDER — BUSPIRONE HCL 15 MG PO TABS: 15.0000 mg | ORAL_TABLET | Freq: Two times a day (BID) | ORAL | 1 refills | Status: DC

## 2019-07-06 ENCOUNTER — Ambulatory Visit: Payer: Medicare HMO | Attending: Internal Medicine

## 2019-07-06 DIAGNOSIS — Z23 Encounter for immunization: Secondary | ICD-10-CM | POA: Insufficient documentation

## 2019-07-06 NOTE — Progress Notes (Signed)
   Covid-19 Vaccination Clinic  Name:  Helen Sandoval    MRN: [id number] DOB: 11/25/1960  07/06/2019  [name] was observed post Covid-19 immunization for 15 minutes without incidence. She was provided with Vaccine Information Sheet and instruction to access the V-Safe system.   [name] was instructed to call 911 with any severe reactions post vaccine: Marland Kitchen Difficulty breathing  . Swelling of your face and throat  . A fast heartbeat  . A bad rash all over your body  . Dizziness and weakness    Immunizations Administered    Name Date Dose VIS Date Route   Pfizer COVID-19 Vaccine 07/06/2019  8:34 AM 0.3 mL 05/03/2019 Intramuscular   Manufacturer: [place]   Lot: [id number]   [place]: [id number]

## 2019-07-28 ENCOUNTER — Ambulatory Visit: Payer: Medicare HMO | Attending: Internal Medicine

## 2019-07-28 DIAGNOSIS — Z23 Encounter for immunization: Secondary | ICD-10-CM | POA: Insufficient documentation

## 2019-07-28 NOTE — Progress Notes (Signed)
   Covid-19 Vaccination Clinic  Name:  Helen Sandoval    MRN: [id number] DOB: 09-27-1960  07/28/2019  [name] was observed post Covid-19 immunization for 15 minutes without incident. She was provided with Vaccine Information Sheet and instruction to access the V-Safe system.   [name] was instructed to call 911 with any severe reactions post vaccine: Marland Kitchen Difficulty breathing  . Swelling of face and throat  . A fast heartbeat  . A bad rash all over body  . Dizziness and weakness   Immunizations Administered    Name Date Dose VIS Date Route   Pfizer COVID-19 Vaccine 07/28/2019 10:30 AM 0.3 mL 05/03/2019 Intramuscular   Manufacturer: [place]   Lot: [id number]   [place]: [id number]

## 2019-07-30 ENCOUNTER — Other Ambulatory Visit: Payer: Self-pay | Admitting: Neurology

## 2019-08-01 DIAGNOSIS — G4733 Obstructive sleep apnea (adult) (pediatric): Secondary | ICD-10-CM | POA: Diagnosis not present

## 2019-08-22 ENCOUNTER — Other Ambulatory Visit: Payer: Self-pay | Admitting: Neurology

## 2019-09-03 ENCOUNTER — Ambulatory Visit: Payer: Medicare HMO | Admitting: Neurology

## 2019-09-30 ENCOUNTER — Telehealth: Payer: Self-pay | Admitting: *Deleted

## 2019-09-30 NOTE — Telephone Encounter (Signed)
Received fax from [facility] that pt has completed second yr of [place]. Last day of treatment: 09/13/2019. [place] lifelines ID [id number]. Phone: [phone].

## 2019-10-06 ENCOUNTER — Encounter: Payer: Self-pay | Admitting: Neurology

## 2019-10-07 ENCOUNTER — Other Ambulatory Visit: Payer: Self-pay

## 2019-10-07 ENCOUNTER — Ambulatory Visit: Payer: Medicare HMO | Admitting: Neurology

## 2019-10-07 ENCOUNTER — Encounter: Payer: Self-pay | Admitting: Neurology

## 2019-10-07 ENCOUNTER — Other Ambulatory Visit: Payer: Self-pay | Admitting: *Deleted

## 2019-10-07 VITALS — BP 138/84 | HR 93 | Temp 97.0°F | Ht 64.0 in | Wt 213.5 lb

## 2019-10-07 DIAGNOSIS — G35 Multiple sclerosis: Secondary | ICD-10-CM | POA: Diagnosis not present

## 2019-10-07 DIAGNOSIS — G4733 Obstructive sleep apnea (adult) (pediatric): Secondary | ICD-10-CM

## 2019-10-07 DIAGNOSIS — Z79899 Other long term (current) drug therapy: Secondary | ICD-10-CM

## 2019-10-07 DIAGNOSIS — R5383 Other fatigue: Secondary | ICD-10-CM | POA: Diagnosis not present

## 2019-10-07 NOTE — Progress Notes (Signed)
Faxed signed order to [employer] for bipap pressure setting to be changed from 13/8 to 15/10cm. Asked that download in 30 days be faxed to [phone] attn: Dr. Felecia Shelling. Fax: [phone]. Received fax confirmation.

## 2019-10-07 NOTE — Progress Notes (Addendum)
[facility]  PATIENT: Helen Sandoval DOB: [birth year]  REFERRING DOCTOR OR PCP:  Otto Herb. ([facility])   Sinclair Ship (new PCP at [facility]) SOURCE: patient and records from [facility]  _________________________________   HISTORICAL  CHIEF COMPLAINT:  Chief Complaint  Patient presents with  . Follow-up    RM 13, alone. Last seen 06/05/2019. Doing well, no new sx.   . Multiple Sclerosis    On Mavenclad. Completed her 2nd yr. Last day of treatment was 09/13/2019.   Marland Kitchen Sleep Apnea    DME: [employer]. Uses bipap machine, was able to pull compliance report from [facility]med. She thinks mask is too big, not fitting correctly. Takes phentermine.    HISTORY OF PRESENT ILLNESS:  Helen Sandoval is a 59 yo woman who was diagnosed with relapsing remitting MS in 2015.     Update 10/07/2019: She is about 8 weeks out from the first day of her second year course.   She tolerated it very well.     She is on BiPAP.     D/L shows 93% compliance.   AHI was elevated at 19.   Of note,  she was 3.2 in 2017 (on 11/6 BiPAP) and 5.6 in 2018.     Phentermine helps her fatigue and she has lost weight  Update 06/05/2019: Her MS is stable and no new symptoms.   She does feel a little more tired the past week.   Gait and strength are fine.   Sensation is doing well.   She has new glasses which helped vision.   Bladder function is good.     She has a little more fatigue, phentermine has helped.   She sleeps well most nights.   No mood issues.   She lives with her mom (age 72).   The mom gets her Covid 41 vaccination tomorrow  She had her two courses of [place] in February and March 2020 and tolerated them well.    She is wanting to continue and take the second year soon.   Her lymphocyte counts were low at 0.3 at 6 months (0.2 at 2 months).     She did have an ultrasound and has two small masses one that is solid may undergo a fine needle aspiration.     She had her shingles  vaccination in November and December and we discussed getting the Covid 19 vaccination --- may want to hold off on second year until after she has done so (10 days after 2nd shot)  She has headaches located in forehead/temple region.    These occur near daily.     No nausea or photophobia.   Ibuprofen helps.     Update 01/03/2019: She is 6 months out from her first dose of Mavenclad.  At month 2, the lymphocyte count was 0.2.  Platelet count was 135.  We will repeat blood work today.  She denies any definite exacerbations though does have a list of medical complaints.  She feels her balance is worse and she has 3 falls since her last visit.     She notes more confusion at times.   Insomnia is worse.   She also notes more depression.  She also notes more fatigue.   She has been taking more naps.   She has OSA and uses BiPAP.  Her sister tells her she snores through her mask.   Of note, weight is increased.    She has more symptoms including headaches, joint pain  and increased sweating.     She notes more depression.   Viibryd was prescribed but poorly covered by insurance so she stayed on Effexor 300 mg .   She notes anxiety as well.    Update 08/29/2018: She had her 1st and 2nd months of Glide in February and March.   She noted mild headache and fatigue the weeks she took the [facility]   Headaches and fatigue are now better.    Gait is doing well with some imbalance but no falls.   Strength and sensation are unchanged.   Her urinary urge and urge incontinence are much better with Myrbetriq.    Mood is ok.   She has mild stable depression.       She is sleeping well at night taking trazodone.  She is on CPAP for OSA.     Adderall has not helped fatigue or focus/attention .    Update 07/05/2018: She feels she is doing about the same as she was at her last visit with no new MS exacerbations.  She does feel more fatigued.  Her gait does fairly well and she is able to walk a mile and keep up with  her friends.  She denies significant weakness or spasticity.  She does note some numbness at times on the left.  She has reduced color vision out of the left eye and mildly more blurry vision out of that eye.  She has mild bladder symptoms that are well controlled by medications.  Her main problems are fatigue and cognitive/attention.  She has had some difficulty with verbal fluency and with executive function such as following recipes.   Today, she will be starting Ignacio and her drug is been shipped to her.  She will be followed in the MASTER study.   Update1/10/2018 Her MS has been stable.  Unfortunately, her risk of PML has increased.  Her JCV Ab titer increased to 2.32 and I discussed changes in therapy.    She is here today to discuss in more detail.   Her last Tysabri was late December.  She had MRI of the brain and spine performed towards the end of December.  I personally reviewed the images with her today.  The MRI of the brain shows multiple T2/flair hyperintense foci in a stable pattern consistent with chronic demyelination (probably with chronic microvascular ischemic change mixed in).  MRI of the cervical spine did not show any new lesions in the spine.  She does have some degenerative changes above and below her fusion.  She continues to report some pain going down the left arm.  Since September, she reports a sinus infection.  She was treated with doxycycline and a Z-pak.   She is taking guafinescan and trying to reduce smoking (now 1/4 ppd).   She has Dulera and ventolin.   She was told a CXR was normal.  She has not had PFT's.    MRI was reviewed and no definite sinusitis noted.  We had a long conversation about different disease modifying therapies for her MS. She was interested in [place], [place] or [place].   Because she appears to have frequent respiratory infections, Ocrevus may have more risk for her.  We went into details about [place] and [place] and she is potentially  interested in these and will think about this more over the next week while we are waiting for blood work to return.  Update 04/30/2018: She is on Tysabri every 6 weeks (had low positive JCV  antibody 0.61).  She tolerates it well and has not had any exacerbations.  However, she felt much better when she took it every 4 weeks experiencing less joint aches and fatigue.   She also has more headaches and more tremors.     Her HA are starting in her neck and they radiate up.       Her gait is doing well.   Occasional stumbles but no falls to the ground.    Bladder is doing well.   Vision is ok.   She has had a little vertigo with mild diplopia at times    She was looking outside and thought she saw squirrels.   She has difficulty focusing while driving.   She is sleepy and will doze off some afternoons and evenings.  Her insomnia is doing well.  She has OSA and uses BiPAP.   For ADD and sleepiness symptoms, she takes Adderall 10 mg po tid.     Update 11/09/2017: She is on Tysabri.  She tolerates the infusions well.   Her last JCV antibody was 04/25/2017.  It was indeterminate at 0.28.  However, the inhibition assay was positive.  This would imply a risk of Tysabri possibly in the 1: 2000 range.  Most of her symptoms are mostly the same.  However, she notes a little more difficulty with her balance and she has had a couple falls.  One fall hurt her knee and she had fluid drained.   Both falls were forward.   She did not trip.   She hit her head with the one fall but no LOc.   She has some difficulty with vision.  Bladder function is fine.   She has fast low amplitude tremor, worse with intention.  Her mom had a tremor.    Her fatigue has done fairly well.  She is sleeping well with BiPAP.  She has had excellent compliance and efficacy on download.  She denies any significant depression or anxiety.  She notes mild cognitive problems like reduced focus and attention, STM and processing.  Adderall has helped the  fatigue and the attentional deficits.   She takes 20-30 mg daily.   She has tramadol but tries to avoid it if she can.     She is also on Wellbutrin and knows not to take more than 2 tramadol's in any 24-hour period.   She has noted some neck pain and also     She notes more sweating  Update 05/11/2017: She feels that her MS has been mostly stable. She continues on Tysabri and tolerates it well. Last JCV antibody showed a titer of 0.28 and the inhib assay was positive. This is similar to her previous one from last year. She notes that the gait is stable. She did have one fall and hitting a coffee table. She notes slight weakness in the right leg. There is no numbness. Bladder function is fine. She does note that vision is doing worse and she plans on seeing her ophthalmologist.  She feels that her fatigue is doing well. She is on BiPAP and sleeps well most nights. She brought in her chip in the download shows 90% compliance with at greater than 4 hours at night. The AHI was 5.8 the feels that mood is doing well on her current regimen. She doesn't note any significant cognitive problems. Adderall has helped her fatigue and focus and attention. She takes tramadol now and then for her leg pain. She never takes more  than 2 in any day. We discussed that the combination of tramadol and Wellbutrin can increased risk of seizures that she should never take more than 2 tramadol any day.   From 11/09/2016: MS:   She has noted more trouble with her vision but otherwise feels about the same as the last visit. Marland Kitchen   MRI of the spine and brain 05/2016 comparedt to 2016 MRI is essentially unchanged.   The MRI of the cervical spine shows C4-C6 ACDF and has 2-3 small spine spots.   She is on Tysabri. She tolerates it well and has not had any exacerbations on it. Her JCV antibody status was indeterminate at last check (0.33 05/11/2016)   Gait/strength/sensation:   Her gait is stable but she feels off balance at times. She has  had a few falls hurting her knee with a recent one. Her right leg feels mildly weaker than the left leg and she drags it when she is tired.   She denies any paresthesias.  Bladder/bowel:     Her urinary urgency and incontinence is doing better with Myrbetriq   No recent bowel incontinence.  Vision:   She feels vision is worse and she notes more diplopia and blurry vision.  This occurs randomly, not just if hot or tired.     . No eye pain.    Fatigue/sleep:   She feels that her fatigue is doing fairly well. Is mild most days but worse with heat and if she has a more active day. The fatigue is both physical and cognitive. She is sleeping fairly well most nights.    Adderall helps fatigue and also slightly helps cognitive issues.    She has OSA and is on BiPAP (Since 2012)   She was told OSA was severe.     She uses BiPAP every night for > 4 hours.        She denies excessive daytime sleepiness but has fatigue.   Her mom noted that shsnored some through her mask.   No recent download but compliance download  11/08/15 shows excellent compliance of 97% and efficacy with AHI only 3.2 on BiPAP 11/6.  Mood/cognition:  Effexor and Wellbutrin helped mood at first but she was more depressed last year and she changed Effexor to [place] is mild.   She notes difficulty with focus and has had some difficulty with verbal fluency and short term memory.      Adderall only helps a little bit.  MS History:   In 2008, she had headaches and poor balance and was told the MRI had some white matter foci at that time.    She had spinal stenosis and needed surgery in 2014.  The surgeon felt her gait and other issues were decreased out of proportion to the extent of spine disease and referred her to Neurology.   Besides gait issues, she also had double vision, blurry vision, cognitive difficulty and incontinence in early 2015. She saw Dr. Pamalee Leyden in [place]. An MRI of the brain, CSF and visual evoked potentials were  consistent with multiple sclerosis.    Initially, she was placed on Tecfidera. Due to progression with more neurologic symptoms, she was switched to Tysabri.   Her last MRI was done 10/2014 but we do not have the images.     REVIEW OF SYSTEMS: Constitutional: No fevers, chills, sweats, or change in appetite.   She notes fatigue Eyes: Reports visual changes and double vision.  No eye pain  Ear, nose and throat: No hearing loss, ear pain, nasal congestion, sore throat Cardiovascular: No chest pain, palpitations Respiratory: No shortness of breath at rest or with exertion.   She has snoring and coughing but no wheezes GastrointestinaI: No nausea, vomiting, abdominal pain.  She notes diarrhea and some fecal incontinence Genitourinary: No dysuria, urinary retention or frequency. She has had some incontinence Musculoskeletal: No neck pain, back pain Integumentary: No rash, pruritus, skin lesions Neurological: as above Psychiatric: Some depression at this time.  Some anxiety Endocrine: No palpitations, diaphoresis, change in appetite, change in weigh or increased thirst Hematologic/Lymphatic: No anemia, purpura, petechiae. Allergic/Immunologic: No itchy/runny eyes, nasal congestion, recent allergic reactions, rashes  ALLERGIES: Allergies  Allergen Reactions  . Clindamycin Anaphylaxis  . Indomethacin Hives  . Levofloxacin Hives  . Penicillins Anaphylaxis  . Penicillins Cross Reactors Anaphylaxis  . Sulfa Antibiotics Swelling    Blister (steven john syndrome)  . Levaquin [Levofloxacin Hemihydrate] Hives  . Lipitor  [Atorvastatin Calcium]   . Sulfamethoxazole-Trimethoprim   . Zocor [Simvastatin] Other (See Comments)    Other reaction(s): MUSCLE PAIN Muscle aches    HOME MEDICATIONS:  Current Outpatient Medications:  .  alendronate (FOSAMAX) 70 MG tablet, Take 70 mg by mouth once a week. Take with a full glass of water on an empty stomach., Disp: , Rfl:  .  aspirin EC 81 MG tablet,  Take 81 mg by mouth every other day.  , Disp: , Rfl:  .  Biotin 10000 MCG TABS, Take by mouth daily. , Disp: , Rfl:  .  buPROPion (WELLBUTRIN SR) 150 MG 12 hr tablet, Take 150 mg by mouth 2 (two) times daily. , Disp: , Rfl:  .  busPIRone (BUSPAR) 15 MG tablet, Take 1 tablet (15 mg total) by mouth 2 (two) times daily., Disp: 180 tablet, Rfl: 1 .  Chlorphen-PE-Acetaminophen (NOREL AD) 4-10-325 MG TABS, Take 1 tablet by mouth as needed., Disp: , Rfl:  .  cholecalciferol (VITAMIN D) 1000 UNITS tablet, Take 5,000 Units by mouth daily.  , Disp: , Rfl:  .  Cladribine (MAVENCLAD, 9 TABS, PO), Take by mouth., Disp: , Rfl:  .  co-enzyme Q-10 30 MG capsule, Take 30 mg by mouth daily. , Disp: , Rfl:  .  cyclobenzaprine (FLEXERIL) 5 MG tablet, Take 1 tablet by mouth three times daily as needed, Disp: 90 tablet, Rfl: 5 .  DULERA 200-5 MCG/ACT AERO, Inhale 2 puffs into the lungs daily. , Disp: , Rfl:  .  estradiol (CLIMARA - DOSED IN MG/24 HR) 0.0375 mg/24hr patch, estradiol 0.0375 mg/24 hr weekly transdermal patch, Disp: , Rfl:  .  ezetimibe (ZETIA) 10 MG tablet, Take 10 mg by mouth daily. , Disp: , Rfl:  .  gabapentin (NEURONTIN) 300 MG capsule, TAKE 1 CAPSULE BY MOUTH THREE TIMES DAILY, Disp: 270 capsule, Rfl: 3 .  Krill Oil 1000 MG CAPS, Take 1,000 mg by mouth daily. , Disp: , Rfl:  .  MYRBETRIQ 50 MG TB24 tablet, Take 1 tablet by mouth once daily, Disp: 90 tablet, Rfl: 3 .  omeprazole (PRILOSEC) 20 MG capsule, Take 20 mg by mouth daily. , Disp: , Rfl:  .  phentermine 37.5 MG capsule, Take 1 capsule by mouth in the morning, Disp: 30 capsule, Rfl: 5 .  traZODone (DESYREL) 100 MG tablet, Take 1 tablet (100 mg total) by mouth at bedtime., Disp: 90 tablet, Rfl: 4 .  TURMERIC PO, Take 1 capsule by mouth every morning. , Disp: , Rfl:  .  venlafaxine XR (EFFEXOR XR) 150 MG 24 hr capsule, Take 2 capsules by mouth daily., Disp: , Rfl:  .  valACYclovir (VALTREX) 500 MG tablet, Take 1 tablet (500mg ) twice daily for  the next 8 weeks (Patient not taking: Reported on 01/03/2019), Disp: 60 tablet, Rfl: 1  PAST MEDICAL HISTORY: Past Medical History:  Diagnosis Date  . Ankle fracture   . Osteopenia   . Scarlet fever   . Shingles   . Sleep apnea   . Spina bifida   . Vitamin D deficiency     PAST SURGICAL HISTORY: Past Surgical History:  Procedure Laterality Date  . ABDOMINAL HYSTERECTOMY      FAMILY HISTORY: History reviewed. No pertinent family history.  SOCIAL HISTORY:  Social History   Socioeconomic History  . Marital status: Divorced    Spouse name: Not on file  . Number of children: Not on file  . Years of education: Not on file  . Highest education level: Not on file  Occupational History  . Not on file  Tobacco Use  . Smoking status: Current Some Day Smoker  . Smokeless tobacco: Never Used  Substance and Sexual Activity  . Alcohol use: No  . Drug use: No  . Sexual activity: Not on file  Other Topics Concern  . Not on file  Social History Narrative  . Not on file   Social Determinants of Health   Financial Resource Strain:   . Difficulty of Paying Living Expenses:   Food Insecurity:   . Worried About Charity fundraiser in the Last Year:   . Arboriculturist in the Last Year:   Transportation Needs:   . Film/video editor (Medical):   Marland Kitchen Lack of Transportation (Non-Medical):   Physical Activity:   . Days of Exercise per Week:   . Minutes of Exercise per Session:   Stress:   . Feeling of Stress :   Social Connections:   . Frequency of Communication with Friends and Family:   . Frequency of Social Gatherings with Friends and Family:   . Attends Religious Services:   . Active Member of Clubs or Organizations:   . Attends Archivist Meetings:   Marland Kitchen Marital Status:   Intimate Partner Violence:   . Fear of Current or Ex-Partner:   . Emotionally Abused:   Marland Kitchen Physically Abused:   . Sexually Abused:      PHYSICAL EXAM  Vitals:   10/07/19 1326  BP:  138/84  Pulse: 93  Temp: (!) 97 F (36.1 C)  Weight: 213 lb 8 oz (96.8 kg)  Height: 5\' 4"  (1.626 m)    Body mass index is 36.65 kg/m.   General: The patient is well-developed and well-nourished and in no acute distress  Neck: The neck is supple with good ROM and nontender.      Neurologic Exam  Mental status: The patient is alert and oriented x 3 at the time of the examination.  During the interaction today, she has apparent normal recent and remote memory, with an apparently normal attention span and concentration ability.   Speech is normal.  Cranial nerves: Extraocular movements appear normal.  Facial strength was normal.  Trapezius strength is normal.  No dysarthria is noted.  Hearing appears normal and symmetric.  Motor:  Muscle bulk is normal.   Tone is normal. Strength is  5 / 5 in all 4 extremities.   Sensory: She reports slightly reduced sensation to vibration in the right  leg    Coordination:  finger-nose-finger is performed well.  Heel-to-shin is performed well.  Gait and station: Station is normal.  The gait is narrow and wide.  The tandem gait is mildly wide.  Romberg sign is negative.  Reflexes: Deep tendon reflexes are symmetric and 2+ arms and 3+ knees with spread.   There is no ankle clonus..    ASSESSMENT AND PLAN    1. Multiple sclerosis ([place])   2. High risk medication use   3. Other fatigue   4. OSA treated with BiPAP     1.  She completed her second course of [place] without incident.  We will check some blood work today. 2.   Continue BiPAP but change to 15/10  She uses it nightly.   3.   Continue Phentermine 37.5 mg in the morning for sleepiness, weight loss and fatigue.  Return in 4 months or sooner if there are new or worsening neurologic symptoms.   Durga Saldarriaga A. Felecia Shelling, MD, PhD [id number], [id number] PM Certified in Neurology, Clinical Neurophysiology, Sleep Medicine, Pain Medicine and Neuroimaging . [facility] [address]  [place], [place] [id number] [phone]

## 2019-10-08 ENCOUNTER — Telehealth: Payer: Self-pay | Admitting: *Deleted

## 2019-10-08 LAB — CBC WITH DIFFERENTIAL/PLATELET
Basophils Absolute: 0 10*3/uL (ref 0.0–0.2)
Basos: 1 %
EOS (ABSOLUTE): 0 10*3/uL (ref 0.0–0.4)
Eos: 1 %
Hematocrit: 37.5 % (ref 34.0–46.6)
Hemoglobin: 13.1 g/dL (ref 11.1–15.9)
Immature Grans (Abs): 0 10*3/uL (ref 0.0–0.1)
Immature Granulocytes: 1 %
Lymphocytes Absolute: 0.1 10*3/uL — ABNORMAL LOW (ref 0.7–3.1)
Lymphs: 4 %
MCH: 33.6 pg — ABNORMAL HIGH (ref 26.6–33.0)
MCHC: 34.9 g/dL (ref 31.5–35.7)
MCV: 96 fL (ref 79–97)
Monocytes Absolute: 0.4 10*3/uL (ref 0.1–0.9)
Monocytes: 9 %
Neutrophils Absolute: 3.4 10*3/uL (ref 1.4–7.0)
Neutrophils: 84 %
Platelets: 102 10*3/uL — ABNORMAL LOW (ref 150–450)
RBC: 3.9 x10E6/uL (ref 3.77–5.28)
RDW: 14.7 % (ref 11.7–15.4)
WBC: 4 10*3/uL (ref 3.4–10.8)

## 2019-10-08 LAB — HEPATIC FUNCTION PANEL
ALT: 52 IU/L — ABNORMAL HIGH (ref 0–32)
AST: 38 IU/L (ref 0–40)
Albumin: 4.7 g/dL (ref 3.8–4.8)
Alkaline Phosphatase: 74 IU/L (ref 48–121)
Bilirubin Total: 0.5 mg/dL (ref 0.0–1.2)
Bilirubin, Direct: 0.16 mg/dL (ref 0.00–0.40)
Total Protein: 6.2 g/dL (ref 6.0–8.5)

## 2019-10-08 LAB — THYROID PANEL WITH TSH
Free Thyroxine Index: 1.2 (ref 1.2–4.9)
T3 Uptake Ratio: 22 % — ABNORMAL LOW (ref 24–39)
T4, Total: 5.4 ug/dL (ref 4.5–12.0)
TSH: 6.09 u[IU]/mL — ABNORMAL HIGH (ref 0.450–4.500)

## 2019-10-08 MED ORDER — VALACYCLOVIR HCL 500 MG PO TABS: 500.0000 mg | ORAL_TABLET | Freq: Two times a day (BID) | ORAL | 0 refills | Status: DC

## 2019-10-08 NOTE — Telephone Encounter (Signed)
-----   Message from Britt Bottom, MD sent at 10/08/2019 10:51 AM EDT ----- Please let her know that the lymphocyte count was fairly low at 0.1.  Therefore, I would like her to do 3 months of Valtrex 500 mg twice daily (can send in One 21-month prescription for 180 tablets).  Sometimes the lymphocytes will be lower the second year than the first year and about 6% of people need to do Valtrex.  The one liver test was slightly high but not high enough to worry about  The TSH was elevated which can be seen with hypothyroidism.  Would you like [place] to send the lab work to her primary care physician?

## 2019-10-09 DIAGNOSIS — G4733 Obstructive sleep apnea (adult) (pediatric): Secondary | ICD-10-CM | POA: Diagnosis not present

## 2019-10-09 DIAGNOSIS — E041 Nontoxic single thyroid nodule: Secondary | ICD-10-CM | POA: Diagnosis not present

## 2019-10-09 DIAGNOSIS — E782 Mixed hyperlipidemia: Secondary | ICD-10-CM | POA: Diagnosis not present

## 2019-10-16 DIAGNOSIS — F321 Major depressive disorder, single episode, moderate: Secondary | ICD-10-CM | POA: Diagnosis not present

## 2019-10-16 DIAGNOSIS — E6609 Other obesity due to excess calories: Secondary | ICD-10-CM | POA: Diagnosis not present

## 2019-10-16 DIAGNOSIS — G4733 Obstructive sleep apnea (adult) (pediatric): Secondary | ICD-10-CM | POA: Diagnosis not present

## 2019-10-16 DIAGNOSIS — J449 Chronic obstructive pulmonary disease, unspecified: Secondary | ICD-10-CM | POA: Diagnosis not present

## 2019-10-16 DIAGNOSIS — E782 Mixed hyperlipidemia: Secondary | ICD-10-CM | POA: Diagnosis not present

## 2019-10-16 DIAGNOSIS — G35 Multiple sclerosis: Secondary | ICD-10-CM | POA: Diagnosis not present

## 2019-11-01 DIAGNOSIS — G4733 Obstructive sleep apnea (adult) (pediatric): Secondary | ICD-10-CM | POA: Diagnosis not present

## 2019-11-03 ENCOUNTER — Other Ambulatory Visit: Payer: Self-pay | Admitting: Neurology

## 2019-11-24 ENCOUNTER — Other Ambulatory Visit: Payer: Self-pay | Admitting: Neurology

## 2019-12-09 ENCOUNTER — Ambulatory Visit
Admission: RE | Admit: 2019-12-09 | Discharge: 2019-12-09 | Disposition: A | Discharge status: Home / Self Care | Payer: Medicare HMO | Source: Ambulatory Visit | Attending: Internal Medicine | Admitting: Internal Medicine

## 2019-12-09 DIAGNOSIS — E041 Nontoxic single thyroid nodule: Secondary | ICD-10-CM

## 2019-12-09 IMAGING — US US THYROID
1 series · 14 of 25 positions shown · non-contrast
Comparison: [DATE]

CLINICAL DATA: Thyroid nodule.

EXAM:
THYROID ULTRASOUND
TECHNIQUE: Ultrasound examination of the thyroid gland and adjacent soft
tissues was performed.

[Series 1: us thyroid · 0.08mm/px · 46 acquisitions, 14 frames shown]
[im 1/46]
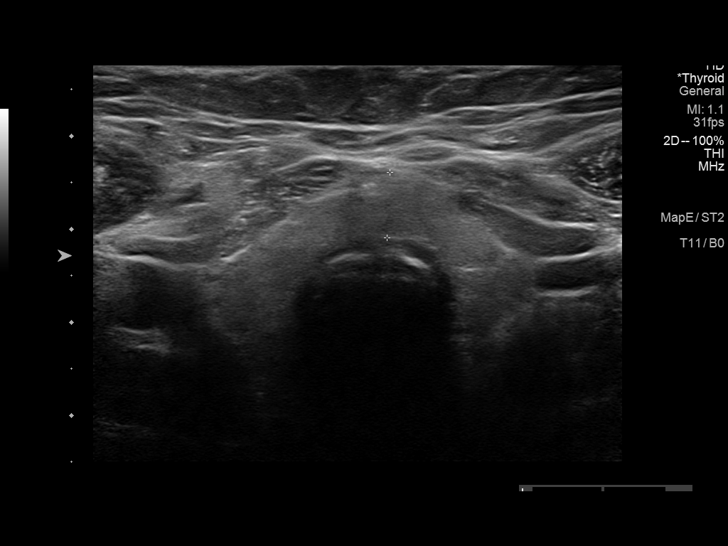
[im 4/46]
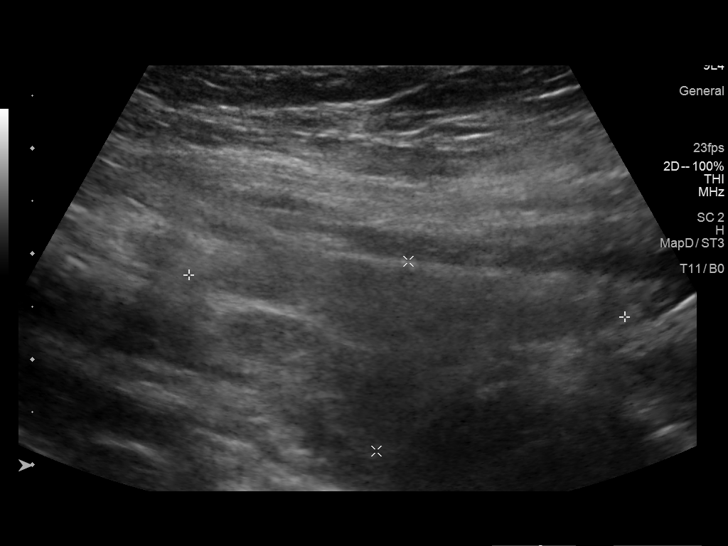
[im 8/46]
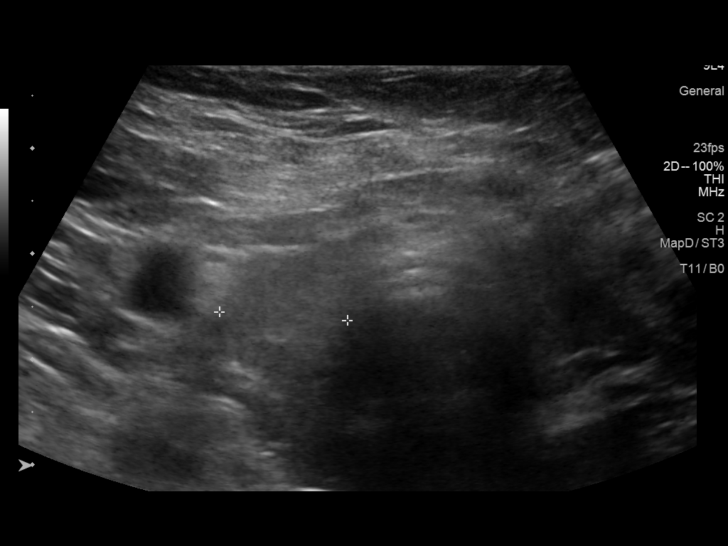
[im 12/46]
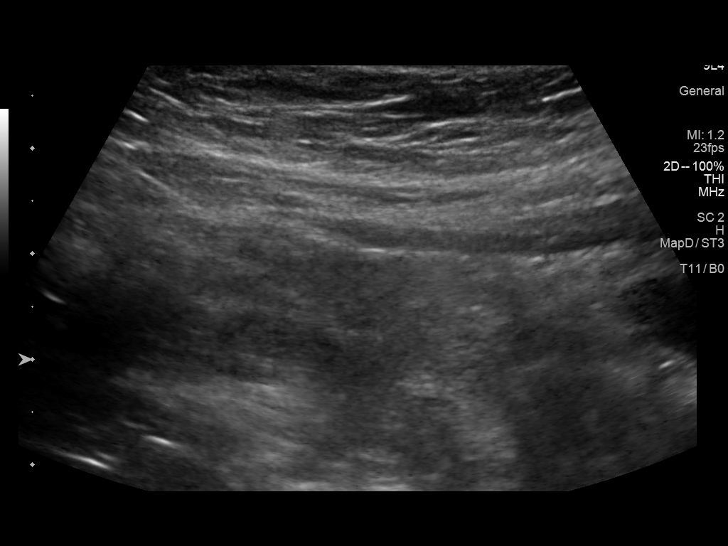
[im 16/46]
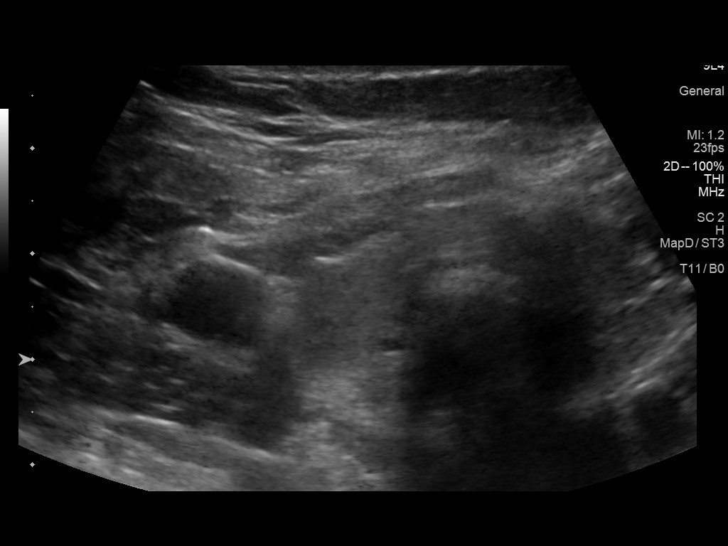
[im 17/46]
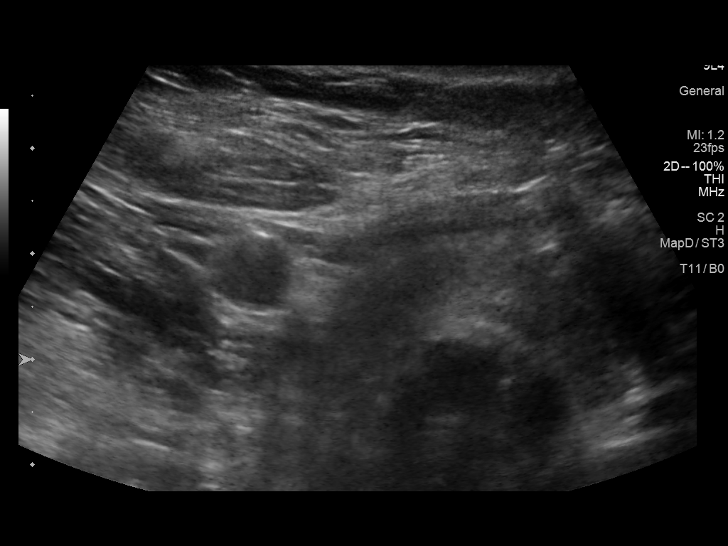
[im 21/46]
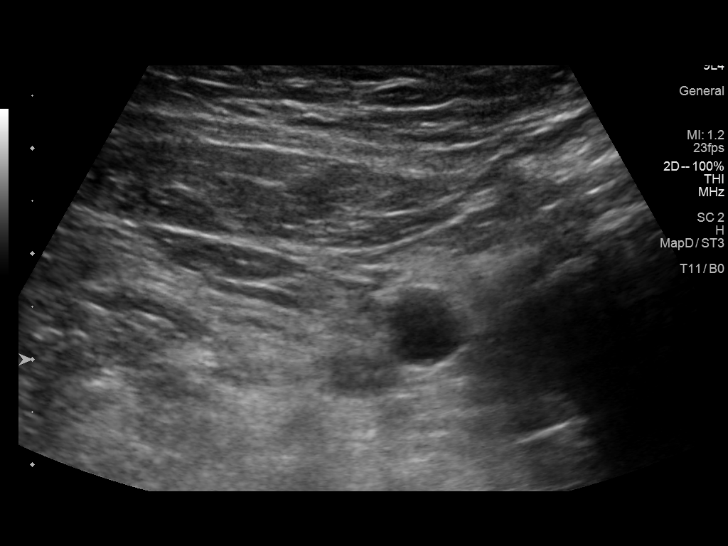
[im 25/46]
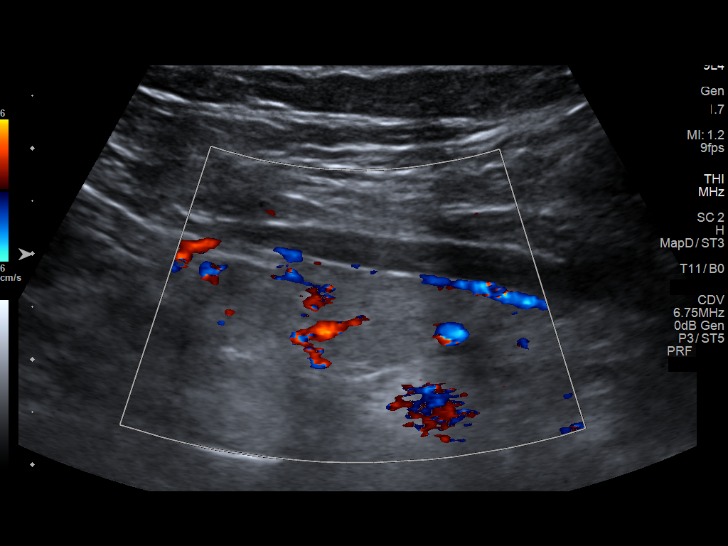
[im 29/46]
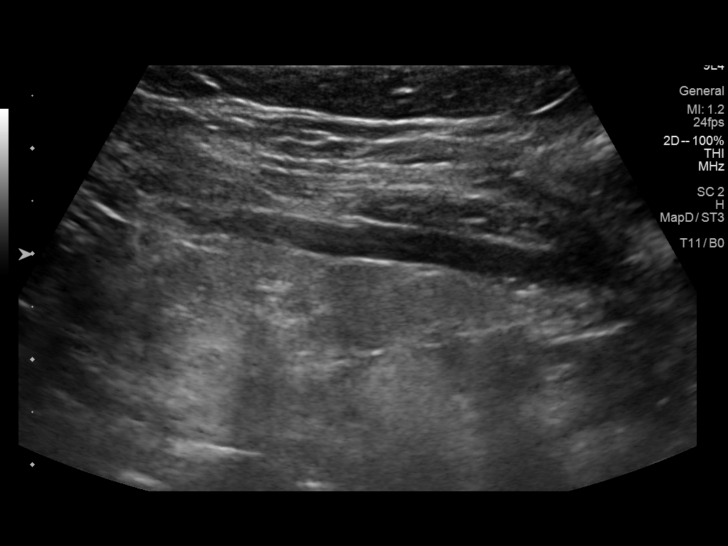
[im 31/46]
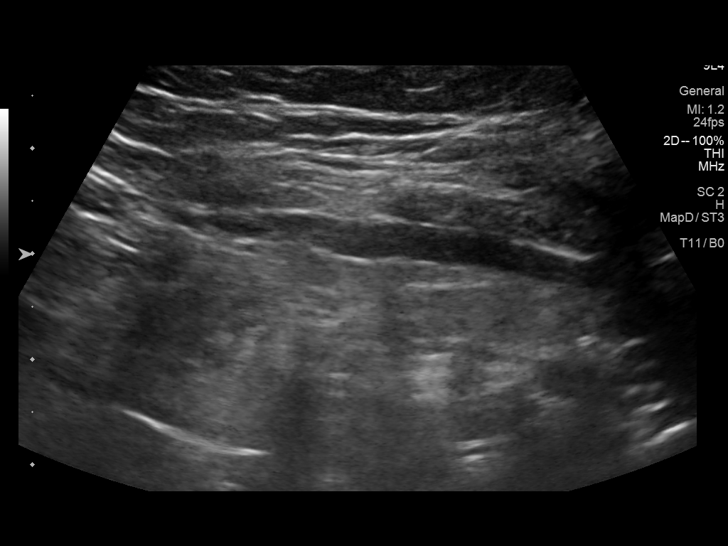
[im 34/46]
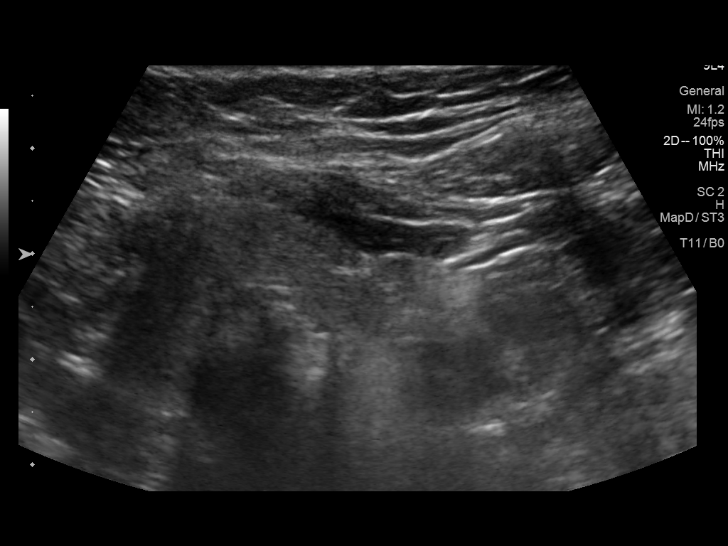
[im 38/46]
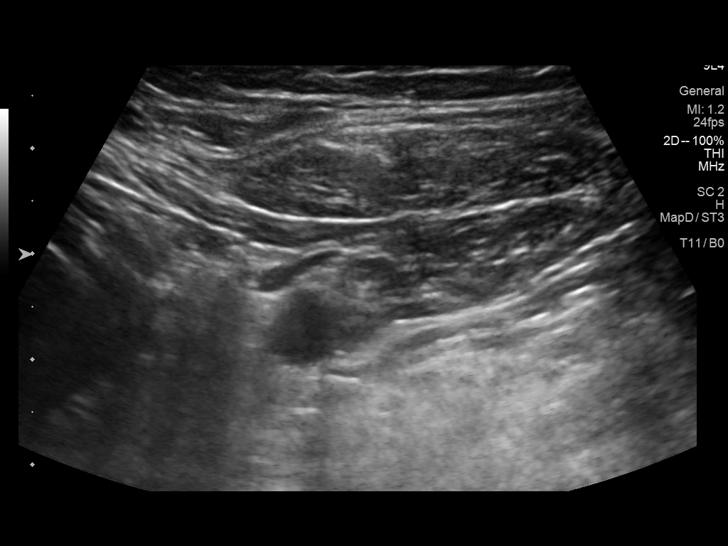
[im 42/46]
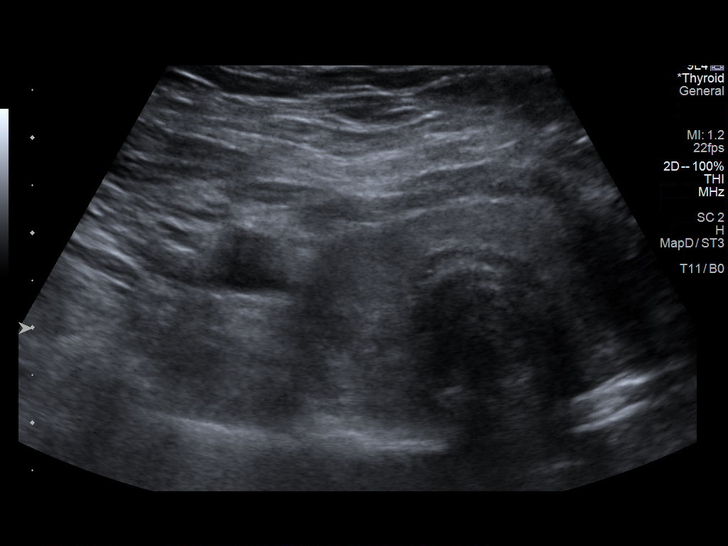
[im 46/46]
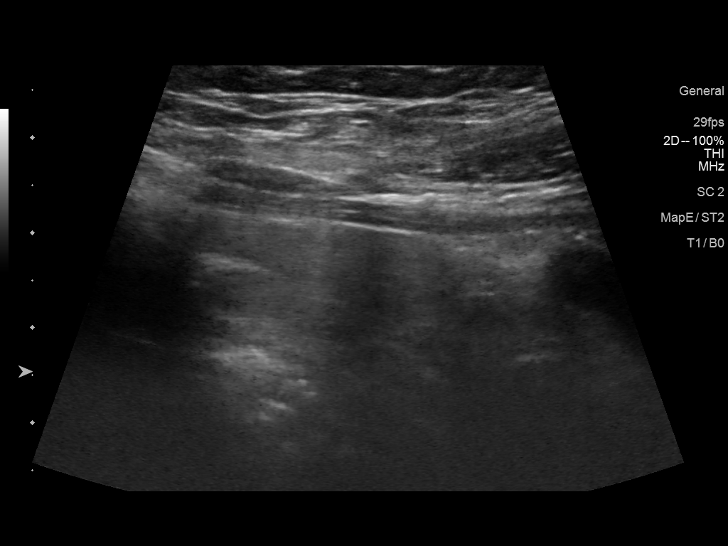

[14 of 25 positions shown; findings below may reference images not displayed]

FINDINGS: Parenchymal Echotexture: Moderately heterogenous

Isthmus: 0.7 cm

Right lobe: 4.2 x 1.8 x 1.2 cm

Left lobe: 3.5 x 1.1 x 1.1 cm

_________________________________________________________

Estimated total number of nodules >/= 1 cm: 0

Number of spongiform nodules >/=  2 cm not described below (TR1): 0

Number of mixed cystic and solid nodules >/= 1.5 cm not described
below (TR2): 0

_________________________________________________________

No discrete nodules are seen within the thyroid gland.
IMPRESSION: No thyroid nodule identified on today's study. The previously
demonstrated abnormality in the right hemi thyroid likely
represented a pseudo nodule.

The above is in keeping with the ACR TI-RADS recommendations - [HOSPITAL] [ET];[DATE].

## 2019-12-23 ENCOUNTER — Other Ambulatory Visit: Payer: Self-pay | Admitting: Neurology

## 2020-01-07 ENCOUNTER — Other Ambulatory Visit: Payer: Self-pay | Admitting: Internal Medicine

## 2020-01-07 DIAGNOSIS — Z1231 Encounter for screening mammogram for malignant neoplasm of breast: Secondary | ICD-10-CM

## 2020-01-26 ENCOUNTER — Other Ambulatory Visit: Payer: Self-pay | Admitting: Neurology

## 2020-01-28 ENCOUNTER — Other Ambulatory Visit: Payer: Self-pay | Admitting: *Deleted

## 2020-01-28 MED ORDER — CYCLOBENZAPRINE HCL 5 MG PO TABS: 5.0000 mg | ORAL_TABLET | Freq: Three times a day (TID) | ORAL | 5 refills | Status: DC | PRN

## 2020-01-30 DIAGNOSIS — Z23 Encounter for immunization: Secondary | ICD-10-CM | POA: Diagnosis not present

## 2020-02-01 ENCOUNTER — Other Ambulatory Visit: Payer: Self-pay | Admitting: Neurology

## 2020-02-03 DIAGNOSIS — G4733 Obstructive sleep apnea (adult) (pediatric): Secondary | ICD-10-CM | POA: Diagnosis not present

## 2020-02-12 ENCOUNTER — Other Ambulatory Visit: Payer: Self-pay

## 2020-02-12 ENCOUNTER — Telehealth: Payer: Self-pay | Admitting: Neurology

## 2020-02-12 ENCOUNTER — Encounter: Payer: Self-pay | Admitting: Neurology

## 2020-02-12 ENCOUNTER — Ambulatory Visit: Payer: Medicare HMO | Admitting: Neurology

## 2020-02-12 VITALS — BP 118/78 | HR 96 | Ht 64.0 in | Wt 213.5 lb

## 2020-02-12 DIAGNOSIS — F329 Major depressive disorder, single episode, unspecified: Secondary | ICD-10-CM | POA: Diagnosis not present

## 2020-02-12 DIAGNOSIS — G35 Multiple sclerosis: Secondary | ICD-10-CM | POA: Diagnosis not present

## 2020-02-12 DIAGNOSIS — F419 Anxiety disorder, unspecified: Secondary | ICD-10-CM | POA: Diagnosis not present

## 2020-02-12 DIAGNOSIS — R269 Unspecified abnormalities of gait and mobility: Secondary | ICD-10-CM

## 2020-02-12 DIAGNOSIS — Z5181 Encounter for therapeutic drug level monitoring: Secondary | ICD-10-CM | POA: Diagnosis not present

## 2020-02-12 DIAGNOSIS — G4733 Obstructive sleep apnea (adult) (pediatric): Secondary | ICD-10-CM

## 2020-02-12 DIAGNOSIS — Z79899 Other long term (current) drug therapy: Secondary | ICD-10-CM | POA: Diagnosis not present

## 2020-02-12 NOTE — Telephone Encounter (Signed)
[employer] pending  

## 2020-02-12 NOTE — Progress Notes (Signed)
[facility]  PATIENT: Helen Sandoval DOB: 1961/01/18  REFERRING DOCTOR OR PCP:  Otto Herb. ([facility])   Sinclair Ship (new PCP at [facility]) SOURCE: patient and records from [facility]  _________________________________   HISTORICAL  CHIEF COMPLAINT:  Chief Complaint  Patient presents with  . Follow-up    RM 12, alone. Last seen 10/07/19. Been sweating a lot more starting about a month ago. Feels more unsteady/shaky. denies any falls.   . Multiple Sclerosis    DMT- Mavenclad.   . Sleep Apnea    On bipap, dme: [employer]    HISTORY OF PRESENT ILLNESS:  Helen Sandoval is a 59 yo woman who was diagnosed with relapsing remitting MS in 2015.     Update 02/12/2020: She took her second year.      She tolerated it very well.   Her lymphocytes were only 0.1 so she took Valtrex x 2 months.   TSH was elevated and T3U decreased.   She has seen PCP who will follow .  She had an U/S showing a pseudo nodule.    Gait and strength are fine.   Sensation is doing the same with mild leg dysesthesia. .  She just takes gabapentin bid now for the leg pain.   She has new glasses which helped vision.   Bladder function is good.    She is noting more fatigue.   Her 60 yo mother (post-op colon surgery) lives with her and she is doing all her chores.  She is sleeping worse but gets 6 hours most nights.  She has an occasioanl day she sleeps much more.  She uses BIPAP nightly Phentermine may help her fatigue and she has lost weight.   She has had some tremors and sweating (day and night).   No change in medications.   We will check TSH/T4 today.    She had her two Covid vaccinations (Feb/March) plus a booster Therapist, music).   She tolerated them well.    Update 06/05/2019: Her MS is stable and no new symptoms.   She does feel a little more tired the past week.       She has a little more fatigue, phentermine has helped.   She sleeps well most nights.   No mood issues.   She lives with  her mom (age 61).   The mom gets her Covid 61 vaccination tomorrow  She had her two courses of [place] in February and March 2020 and tolerated them well.    She is wanting to continue and take the second year soon.   Her lymphocyte counts were low at 0.3 at 6 months (0.2 at 2 months).     She did have an ultrasound and has two small masses one that is solid may undergo a fine needle aspiration.     She had her shingles vaccination in November and December and we discussed getting the Covid 19 vaccination --- may want to hold off on second year until after she has done so (10 days after 2nd shot)  She has headaches located in forehead/temple region.    These occur near daily.     No nausea or photophobia.   Ibuprofen helps.     Update 01/03/2019: She is 6 months out from her first dose of [place]clad.  At month 2, the lymphocyte count was 0.2.  Platelet count was 135.  We will repeat blood work today.  She denies any definite exacerbations though does have a  list of medical complaints.  She feels her balance is worse and she has 3 falls since her last visit.     She notes more confusion at times.   Insomnia is worse.   She also notes more depression.  She also notes more fatigue.   She has been taking more naps.   She has OSA and uses BiPAP.  Her sister tells her she snores through her mask.   Of note, weight is increased.    She has more symptoms including headaches, joint pain and increased sweating.     She notes more depression.   Viibryd was prescribed but poorly covered by insurance so she stayed on Effexor 300 mg .   She notes anxiety as well.    Update 08/29/2018: She had her 1st and 2nd months of [place] in February and March.   She noted mild headache and fatigue the weeks she took the [facility].   Headaches and fatigue are now better.    Gait is doing well with some imbalance but no falls.   Strength and sensation are unchanged.   Her urinary urge and urge incontinence are much better  with Myrbetriq.    Mood is ok.   She has mild stable depression.       She is sleeping well at night taking trazodone.  She is on CPAP for OSA.     Adderall has not helped fatigue or focus/attention .    Update 07/05/2018: She feels she is doing about the same as she was at her last visit with no new MS exacerbations.  She does feel more fatigued.  Her gait does fairly well and she is able to walk a mile and keep up with her friends.  She denies significant weakness or spasticity.  She does note some numbness at times on the left.  She has reduced color vision out of the left eye and mildly more blurry vision out of that eye.  She has mild bladder symptoms that are well controlled by medications.  Her main problems are fatigue and cognitive/attention.  She has had some difficulty with verbal fluency and with executive function such as following recipes.   Today, she will be starting [place] and her drug is been shipped to her.  She will be followed in the MASTER study.   Update1/10/2018 Her MS has been stable.  Unfortunately, her risk of PML has increased.  Her JCV Ab titer increased to 2.32 and I discussed changes in therapy.    She is here today to discuss in more detail.   Her last Tysabri was late December.  She had MRI of the brain and spine performed towards the end of December.  I personally reviewed the images with her today.  The MRI of the brain shows multiple T2/flair hyperintense foci in a stable pattern consistent with chronic demyelination (probably with chronic microvascular ischemic change mixed in).  MRI of the cervical spine did not show any new lesions in the spine.  She does have some degenerative changes above and below her fusion.  She continues to report some pain going down the left arm.  Since September, she reports a sinus infection.  She was treated with doxycycline and a Z-pak.   She is taking guafinescan and trying to reduce smoking (now 1/4 ppd).   She has Dulera and  ventolin.   She was told a CXR was normal.  She has not had PFT's.    MRI was reviewed and  no definite sinusitis noted.  We had a long conversation about different disease modifying therapies for her MS. She was interested in [place], [place] or [place].   Because she appears to have frequent respiratory infections, Ocrevus may have more risk for her.  We went into details about [place] and [place] and she is potentially interested in these and will think about this more over the next week while we are waiting for blood work to return.  Update 04/30/2018: She is on Tysabri every 6 weeks (had low positive JCV antibody 0.61).  She tolerates it well and has not had any exacerbations.  However, she felt much better when she took it every 4 weeks experiencing less joint aches and fatigue.   She also has more headaches and more tremors.     Her HA are starting in her neck and they radiate up.       Her gait is doing well.   Occasional stumbles but no falls to the ground.    Bladder is doing well.   Vision is ok.   She has had a little vertigo with mild diplopia at times    She was looking outside and thought she saw squirrels.   She has difficulty focusing while driving.   She is sleepy and will doze off some afternoons and evenings.  Her insomnia is doing well.  She has OSA and uses BiPAP.   For ADD and sleepiness symptoms, she takes Adderall 10 mg po tid.     Update 11/09/2017: She is on Tysabri.  She tolerates the infusions well.   Her last JCV antibody was 04/25/2017.  It was indeterminate at 0.28.  However, the inhibition assay was positive.  This would imply a risk of Tysabri possibly in the 1: 2000 range.  Most of her symptoms are mostly the same.  However, she notes a little more difficulty with her balance and she has had a couple falls.  One fall hurt her knee and she had fluid drained.   Both falls were forward.   She did not trip.   She hit her head with the one fall but no LOc.   She has some  difficulty with vision.  Bladder function is fine.   She has fast low amplitude tremor, worse with intention.  Her mom had a tremor.    Her fatigue has done fairly well.  She is sleeping well with BiPAP.  She has had excellent compliance and efficacy on download.  She denies any significant depression or anxiety.  She notes mild cognitive problems like reduced focus and attention, STM and processing.  Adderall has helped the fatigue and the attentional deficits.   She takes 20-30 mg daily.   She has tramadol but tries to avoid it if she can.     She is also on Wellbutrin and knows not to take more than 2 tramadol's in any 24-hour period.   She has noted some neck pain and also     She notes more sweating  Update 05/11/2017: She feels that her MS has been mostly stable. She continues on Tysabri and tolerates it well. Last JCV antibody showed a titer of 0.28 and the inhib assay was positive. This is similar to her previous one from last year. She notes that the gait is stable. She did have one fall and hitting a coffee table. She notes slight weakness in the right leg. There is no numbness. Bladder function is fine. She does note that vision  is doing worse and she plans on seeing her ophthalmologist.  She feels that her fatigue is doing well. She is on BiPAP and sleeps well most nights. She brought in her chip in the download shows 90% compliance with at greater than 4 hours at night. The AHI was 5.8 the feels that mood is doing well on her current regimen. She doesn't note any significant cognitive problems. Adderall has helped her fatigue and focus and attention. She takes tramadol now and then for her leg pain. She never takes more than 2 in any day. We discussed that the combination of tramadol and Wellbutrin can increased risk of seizures that she should never take more than 2 tramadol any day.   From 11/09/2016: MS:   She has noted more trouble with her vision but otherwise feels about the same as the  last visit. Marland Kitchen   MRI of the spine and brain 05/2016 comparedt to 2016 MRI is essentially unchanged.   The MRI of the cervical spine shows C4-C6 ACDF and has 2-3 small spine spots.   She is on Tysabri. She tolerates it well and has not had any exacerbations on it. Her JCV antibody status was indeterminate at last check (0.33 05/11/2016)   Gait/strength/sensation:   Her gait is stable but she feels off balance at times. She has had a few falls hurting her knee with a recent one. Her right leg feels mildly weaker than the left leg and she drags it when she is tired.   She denies any paresthesias.  Bladder/bowel:     Her urinary urgency and incontinence is doing better with Myrbetriq   No recent bowel incontinence.  Vision:   She feels vision is worse and she notes more diplopia and blurry vision.  This occurs randomly, not just if hot or tired.     . No eye pain.    Fatigue/sleep:   She feels that her fatigue is doing fairly well. Is mild most days but worse with heat and if she has a more active day. The fatigue is both physical and cognitive. She is sleeping fairly well most nights.    Adderall helps fatigue and also slightly helps cognitive issues.    She has OSA and is on BiPAP (Since 2012)   She was told OSA was severe.     She uses BiPAP every night for > 4 hours.        She denies excessive daytime sleepiness but has fatigue.   Her mom noted that shsnored some through her mask.   No recent download but compliance download  11/08/15 shows excellent compliance of 97% and efficacy with AHI only 3.2 on BiPAP 11/6.  Mood/cognition:  Effexor and Wellbutrin helped mood at first but she was more depressed last year and she changed Effexor to [place] is mild.   She notes difficulty with focus and has had some difficulty with verbal fluency and short term memory.      Adderall only helps a little bit.  MS History:   In 2008, she had headaches and poor balance and was told the MRI had some white matter  foci at that time.    She had spinal stenosis and needed surgery in 2014.  The surgeon felt her gait and other issues were decreased out of proportion to the extent of spine disease and referred her to Neurology.   Besides gait issues, she also had double vision, blurry vision, cognitive difficulty and incontinence  in early 2015. She saw Dr. Pamalee Leyden in [place]. An MRI of the brain, CSF and visual evoked potentials were consistent with multiple sclerosis.    Initially, she was placed on Tecfidera. Due to progression with more neurologic symptoms, she was switched to Tysabri.   Her last MRI was done 10/2014 but we do not have the images.     REVIEW OF SYSTEMS: Constitutional: No fevers, chills, sweats, or change in appetite.   She notes fatigue Eyes: Reports visual changes and double vision.  No eye pain Ear, nose and throat: No hearing loss, ear pain, nasal congestion, sore throat Cardiovascular: No chest pain, palpitations Respiratory: No shortness of breath at rest or with exertion.   She has snoring and coughing but no wheezes GastrointestinaI: No nausea, vomiting, abdominal pain.  She notes diarrhea and some fecal incontinence Genitourinary: No dysuria, urinary retention or frequency. She has had some incontinence Musculoskeletal: No neck pain, back pain Integumentary: No rash, pruritus, skin lesions Neurological: as above Psychiatric: Some depression at this time.  Some anxiety Endocrine: No palpitations, diaphoresis, change in appetite, change in weigh or increased thirst Hematologic/Lymphatic: No anemia, purpura, petechiae. Allergic/Immunologic: No itchy/runny eyes, nasal congestion, recent allergic reactions, rashes  ALLERGIES: Allergies  Allergen Reactions  . Clindamycin Anaphylaxis  . Indomethacin Hives  . Levofloxacin Hives  . Penicillins Anaphylaxis  . Penicillins Cross Reactors Anaphylaxis  . Sulfa Antibiotics Swelling    Blister (steven john syndrome)  . Levaquin  [Levofloxacin Hemihydrate] Hives  . Lipitor  [Atorvastatin Calcium]   . Sulfamethoxazole-Trimethoprim   . Zocor [Simvastatin] Other (See Comments)    Other reaction(s): MUSCLE PAIN Muscle aches    HOME MEDICATIONS:  Current Outpatient Medications:  .  alendronate (FOSAMAX) 70 MG tablet, Take 70 mg by mouth once a week. Take with a full glass of water on an empty stomach., Disp: , Rfl:  .  APPLE CIDER VINEGAR PO, Take 625 mg by mouth daily. 2 caps per day, Disp: , Rfl:  .  aspirin EC 81 MG tablet, Take 81 mg by mouth every other day.  , Disp: , Rfl:  .  Biotin 10000 MCG TABS, Take by mouth daily. , Disp: , Rfl:  .  buPROPion (WELLBUTRIN SR) 150 MG 12 hr tablet, Take 150 mg by mouth 2 (two) times daily. , Disp: , Rfl:  .  busPIRone (BUSPAR) 15 MG tablet, Take 1 tablet by mouth twice daily, Disp: 180 tablet, Rfl: 1 .  Chlorphen-PE-Acetaminophen (NOREL AD) 4-10-325 MG TABS, Take 1 tablet by mouth as needed., Disp: , Rfl:  .  cholecalciferol (VITAMIN D) 1000 UNITS tablet, Take 5,000 Units by mouth daily.  , Disp: , Rfl:  .  Cladribine (MAVENCLAD, 9 TABS, PO), Take by mouth., Disp: , Rfl:  .  co-enzyme Q-10 30 MG capsule, Take 100 mg by mouth daily. , Disp: , Rfl:  .  cyclobenzaprine (FLEXERIL) 5 MG tablet, Take 1 tablet (5 mg total) by mouth 3 (three) times daily as needed., Disp: 90 tablet, Rfl: 5 .  DULERA 200-5 MCG/ACT AERO, Inhale 2 puffs into the lungs daily. , Disp: , Rfl:  .  estradiol (CLIMARA - DOSED IN MG/24 HR) 0.0375 mg/24hr patch, estradiol 0.0375 mg/24 hr weekly transdermal patch, Disp: , Rfl:  .  ezetimibe (ZETIA) 10 MG tablet, Take 10 mg by mouth daily. , Disp: , Rfl:  .  gabapentin (NEURONTIN) 300 MG capsule, TAKE 1 CAPSULE BY MOUTH THREE TIMES DAILY, Disp: 270 capsule, Rfl: 3 .  Javier Docker  Oil 1000 MG CAPS, Take 1,000 mg by mouth daily. , Disp: , Rfl:  .  MYRBETRIQ 50 MG TB24 tablet, Take 1 tablet by mouth once daily, Disp: 90 tablet, Rfl: 3 .  omeprazole (PRILOSEC) 20 MG capsule,  Take 20 mg by mouth daily. , Disp: , Rfl:  .  phentermine 37.5 MG capsule, Take 1 capsule by mouth in the morning, Disp: 30 capsule, Rfl: 5 .  traZODone (DESYREL) 100 MG tablet, TAKE 1 TABLET BY MOUTH AT BEDTIME, Disp: 90 tablet, Rfl: 1 .  TURMERIC PO, Take 1 capsule by mouth every morning. +[place] 600mg /5mg , Disp: , Rfl:  .  valACYclovir (VALTREX) 500 MG tablet, Take 1 tablet (500 mg total) by mouth 2 (two) times daily., Disp: 180 tablet, Rfl: 0 .  venlafaxine XR (EFFEXOR XR) 150 MG 24 hr capsule, Take 2 capsules by mouth daily., Disp: , Rfl:   PAST MEDICAL HISTORY: Past Medical History:  Diagnosis Date  . Ankle fracture   . Osteopenia   . Scarlet fever   . Shingles   . Sleep apnea   . Spina bifida   . Vitamin D deficiency     PAST SURGICAL HISTORY: Past Surgical History:  Procedure Laterality Date  . ABDOMINAL HYSTERECTOMY      FAMILY HISTORY: No family history on file.  SOCIAL HISTORY:  Social History   Socioeconomic History  . Marital status: Divorced    Spouse name: Not on file  . Number of children: Not on file  . Years of education: Not on file  . Highest education level: Not on file  Occupational History  . Not on file  Tobacco Use  . Smoking status: Current Some Day Smoker  . Smokeless tobacco: Never Used  Substance and Sexual Activity  . Alcohol use: No  . Drug use: No  . Sexual activity: Not on file  Other Topics Concern  . Not on file  Social History Narrative  . Not on file   Social Determinants of Health   Financial Resource Strain:   . Difficulty of Paying Living Expenses: Not on file  Food Insecurity:   . Worried About Charity fundraiser in the Last Year: Not on file  . Ran Out of Food in the Last Year: Not on file  Transportation Needs:   . Lack of Transportation (Medical): Not on file  . Lack of Transportation (Non-Medical): Not on file  Physical Activity:   . Days of Exercise per Week: Not on file  . Minutes of Exercise per  Session: Not on file  Stress:   . Feeling of Stress : Not on file  Social Connections:   . Frequency of Communication with Friends and Family: Not on file  . Frequency of Social Gatherings with Friends and Family: Not on file  . Attends Religious Services: Not on file  . Active Member of Clubs or Organizations: Not on file  . Attends Archivist Meetings: Not on file  . Marital Status: Not on file  Intimate Partner Violence:   . Fear of Current or Ex-Partner: Not on file  . Emotionally Abused: Not on file  . Physically Abused: Not on file  . Sexually Abused: Not on file     PHYSICAL EXAM  Vitals:   02/12/20 1255  BP: 118/78  Pulse: 96  SpO2: 96%  Weight: 213 lb 8 oz (96.8 kg)  Height: 5\' 4"  (1.626 m)    Body mass index is 36.65 kg/m.   General: The  patient is well-developed and well-nourished and in no acute distress  Neck: The neck is supple with good ROM and nontender.      Neurologic Exam  Mental status: The patient is alert and oriented x 3 at the time of the examination.  During the interaction today, she has apparent normal recent and remote memory, with an apparently normal attention span and concentration ability.   Speech is normal.  Cranial nerves: Extraocular movements appear normal.  Facial strength was normal.  Trapezius strength is normal.  No dysarthria is noted.  Hearing appears normal and symmetric.  Motor:  Muscle bulk is normal.   Tone is normal. Strength is  5 / 5 in all 4 extremities.   Sensory: She reports slightly reduced sensation to vibration in the right leg    Coordination:  finger-nose-finger is performed well.  Heel-to-shin is performed well.  Gait and station: Station is normal.  Gait has a mildly reduced stride and is mildly wide.  Tandem is wide.  Romberg sign is negative.  Reflexes: Deep tendon reflexes are symmetric and 2+ arms and 3+ knees with spread.   There is no ankle clonus..    ASSESSMENT AND PLAN    1.  Multiple sclerosis ([place])   2. Encounter for monitoring immunomodulating therapy   3. OSA treated with BiPAP   4. Gait disturbance   5. Anxiety and depression     1.  She completed her second course of [place] .  The lymphocyte count was low at 2 months.  We will recheck the CBC with differential and also check CMP and TSH today.  To make sure that she has had an adequate response to vaccination I will also check the SARS-CoV-2 IgG.  We will also check an MRI of the brain later this year to determine if she is having subclinical activity.  If this is occurring we will need to consider adding a disease modifying therapy. 2.   Continue BiPAP but change to 15/10  She uses it nightly.   3.   Continue Phentermine 37.5 mg in the morning for sleepiness, weight loss and fatigue.  Return in 6 months or sooner if there are new or worsening neurologic symptoms.   Kiffany Schelling A. Felecia Shelling, MD, PhD 1/61/0960, 4:54 PM Certified in Neurology, Clinical Neurophysiology, Sleep Medicine, Pain Medicine and Neuroimaging . [facility] [address], [place], [place] [address] [phone]

## 2020-02-13 ENCOUNTER — Other Ambulatory Visit: Payer: Self-pay | Admitting: *Deleted

## 2020-02-13 ENCOUNTER — Encounter: Payer: Self-pay | Admitting: *Deleted

## 2020-02-13 DIAGNOSIS — Z79899 Other long term (current) drug therapy: Secondary | ICD-10-CM

## 2020-02-13 DIAGNOSIS — G35 Multiple sclerosis: Secondary | ICD-10-CM

## 2020-02-13 DIAGNOSIS — R899 Unspecified abnormal finding in specimens from other organs, systems and tissues: Secondary | ICD-10-CM

## 2020-02-13 LAB — CBC WITH DIFFERENTIAL/PLATELET
Basophils Absolute: 0 10*3/uL (ref 0.0–0.2)
Basos: 0 %
EOS (ABSOLUTE): 0 10*3/uL (ref 0.0–0.4)
Eos: 1 %
Hematocrit: 37 % (ref 34.0–46.6)
Hemoglobin: 12.6 g/dL (ref 11.1–15.9)
Immature Grans (Abs): 0 10*3/uL (ref 0.0–0.1)
Immature Granulocytes: 1 %
Lymphocytes Absolute: 0.3 10*3/uL — ABNORMAL LOW (ref 0.7–3.1)
Lymphs: 7 %
MCH: 34.2 pg — ABNORMAL HIGH (ref 26.6–33.0)
MCHC: 34.1 g/dL (ref 31.5–35.7)
MCV: 101 fL — ABNORMAL HIGH (ref 79–97)
Monocytes Absolute: 0.3 10*3/uL (ref 0.1–0.9)
Monocytes: 8 %
Neutrophils Absolute: 3.4 10*3/uL (ref 1.4–7.0)
Neutrophils: 83 %
Platelets: 89 10*3/uL — CL (ref 150–450)
RBC: 3.68 x10E6/uL — ABNORMAL LOW (ref 3.77–5.28)
RDW: 12.5 % (ref 11.7–15.4)
WBC: 4.1 10*3/uL (ref 3.4–10.8)

## 2020-02-13 LAB — COMPREHENSIVE METABOLIC PANEL
ALT: 38 IU/L — ABNORMAL HIGH (ref 0–32)
AST: 28 IU/L (ref 0–40)
Albumin/Globulin Ratio: 2.7 — ABNORMAL HIGH (ref 1.2–2.2)
Albumin: 4.6 g/dL (ref 3.8–4.8)
Alkaline Phosphatase: 81 IU/L (ref 44–121)
BUN/Creatinine Ratio: 15 (ref 12–28)
BUN: 16 mg/dL (ref 8–27)
Bilirubin Total: 0.6 mg/dL (ref 0.0–1.2)
CO2: 24 mmol/L (ref 20–29)
Calcium: 9.2 mg/dL (ref 8.7–10.3)
Chloride: 103 mmol/L (ref 96–106)
Creatinine, Ser: 1.08 mg/dL — ABNORMAL HIGH (ref 0.57–1.00)
GFR calc Af Amer: 64 mL/min/{1.73_m2} (ref >59)
GFR calc non Af Amer: 56 mL/min/{1.73_m2} — ABNORMAL LOW (ref >59)
Globulin, Total: 1.7 g/dL (ref 1.5–4.5)
Glucose: 142 mg/dL — ABNORMAL HIGH (ref 65–99)
Potassium: 4.4 mmol/L (ref 3.5–5.2)
Sodium: 141 mmol/L (ref 134–144)
Total Protein: 6.3 g/dL (ref 6.0–8.5)

## 2020-02-13 LAB — SAR COV2 SEROLOGY (COVID19)AB(IGG),IA: DiaSorin SARS-CoV-2 Ab, IgG: POSITIVE

## 2020-02-13 LAB — THYROID PANEL WITH TSH
Free Thyroxine Index: 1.1 — ABNORMAL LOW (ref 1.2–4.9)
T3 Uptake Ratio: 20 % — ABNORMAL LOW (ref 24–39)
T4, Total: 5.7 ug/dL (ref 4.5–12.0)
TSH: 3.94 u[IU]/mL (ref 0.450–4.500)

## 2020-02-13 NOTE — Progress Notes (Signed)
Please call patient and advise her that her labs show reduced red blood cell count and also reduction in her platelets.  Numbers have been trending down, could be from her MS medication.  It would be worth rechecking in about 2 to 3 weeks.  The platelets are not low enough to be concerned about spontaneous bleeding but she could be at risk of excess bleeding if she were to have a cut or so.  Please remind her to be mindful of any such issues with increased bleeding.  Blood sugar was a little elevated as well.  Kidney function is also something that should be monitored, her creatinine level was slightly increased.  Thyroid panel fairly normal.  COVID-19 antibodies positive, I am assuming she is vaccinated.

## 2020-02-13 NOTE — Telephone Encounter (Signed)
Mcarthur Rossetti Josem Kaufmann: [phone] (exp. 02/12/20 to 03/13/20) order sent to GI. They will reach out to the patient to schedule.

## 2020-02-14 ENCOUNTER — Other Ambulatory Visit: Payer: Self-pay

## 2020-02-14 ENCOUNTER — Ambulatory Visit
Admission: RE | Admit: 2020-02-14 | Discharge: 2020-02-14 | Disposition: A | Discharge status: Home / Self Care | Payer: Medicare HMO | Source: Ambulatory Visit

## 2020-02-14 DIAGNOSIS — Z1231 Encounter for screening mammogram for malignant neoplasm of breast: Secondary | ICD-10-CM

## 2020-02-14 IMAGING — MG DIGITAL SCREENING BILAT W/ TOMO W/ CAD
8 series · 8 of 24 positions shown · non-contrast
Comparison: Previous exam(s).

CLINICAL DATA: Screening.

EXAM:
DIGITAL SCREENING BILATERAL MAMMOGRAM WITH TOMO AND CAD

[L MLO synth-2D]
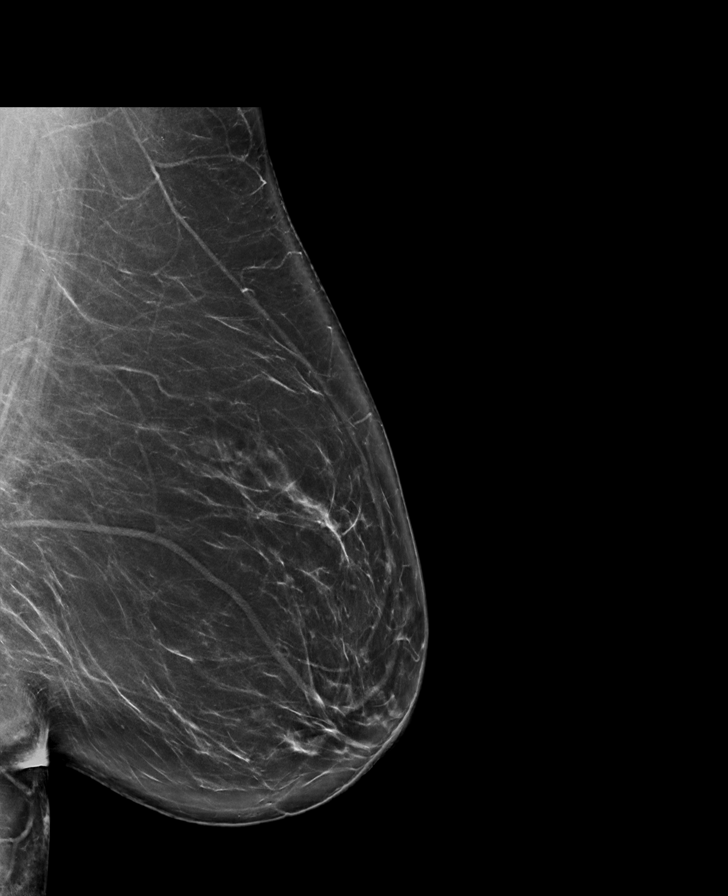

[L CC synth-2D]
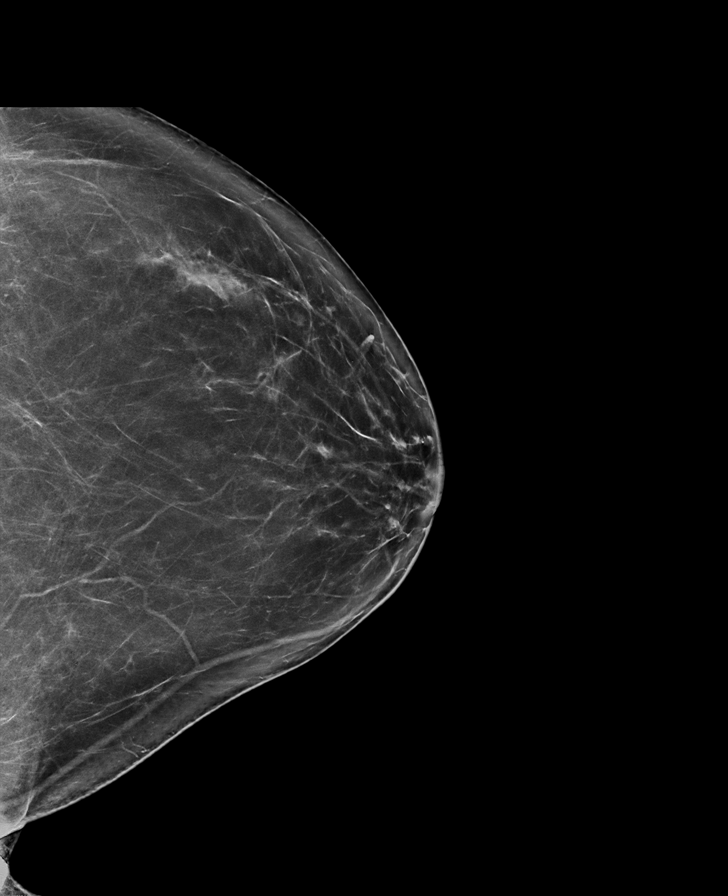

[R CC synth-2D]
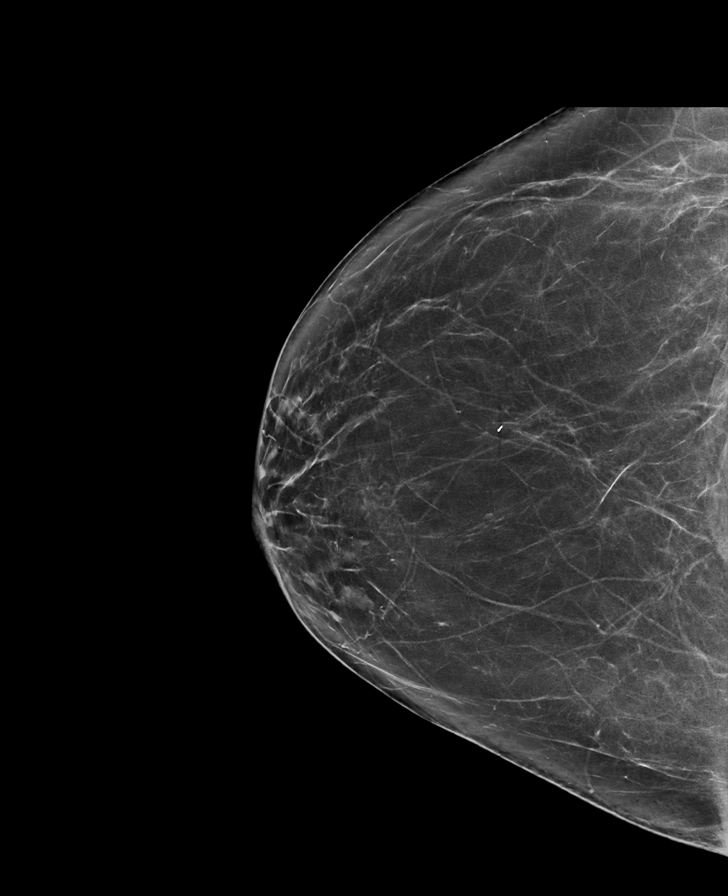

[R MLO synth-2D]
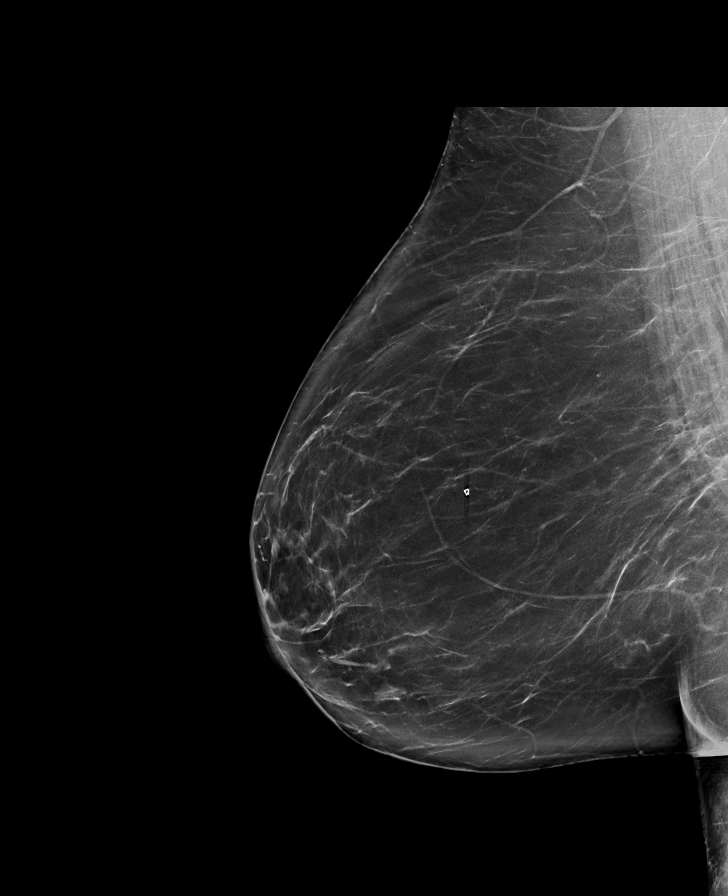

[R CC tomo · tomo slice 37/74.0]
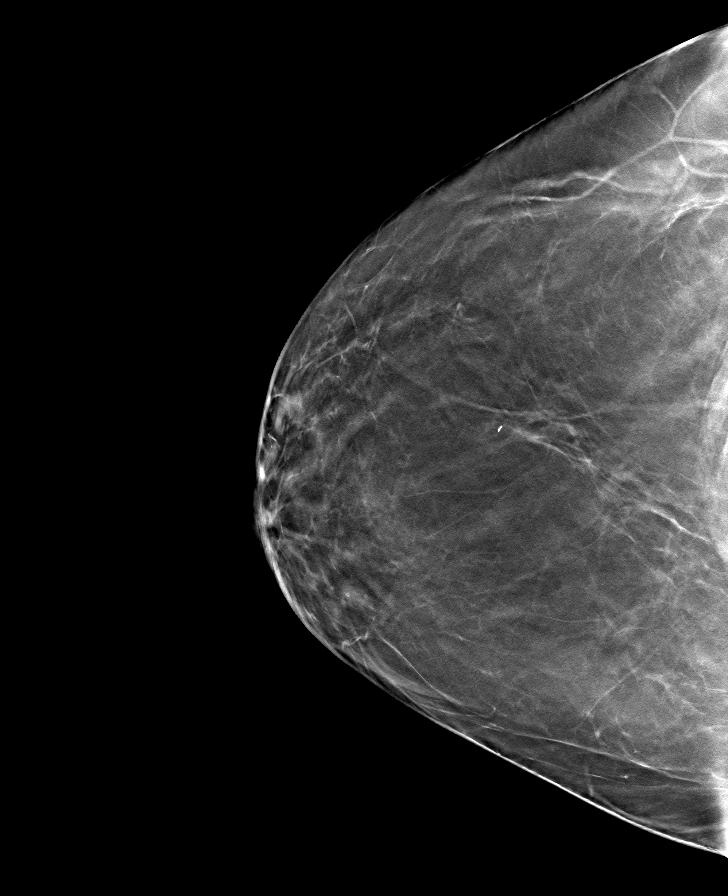

[R MLO tomo · tomo slice 45/89.0]
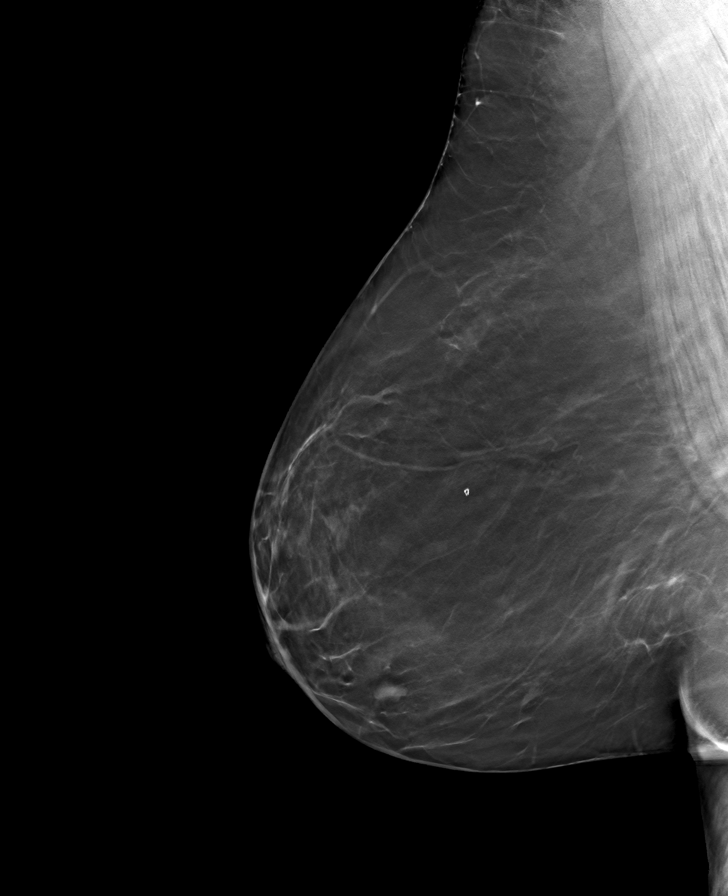

[L CC tomo · tomo slice 39/77.0]
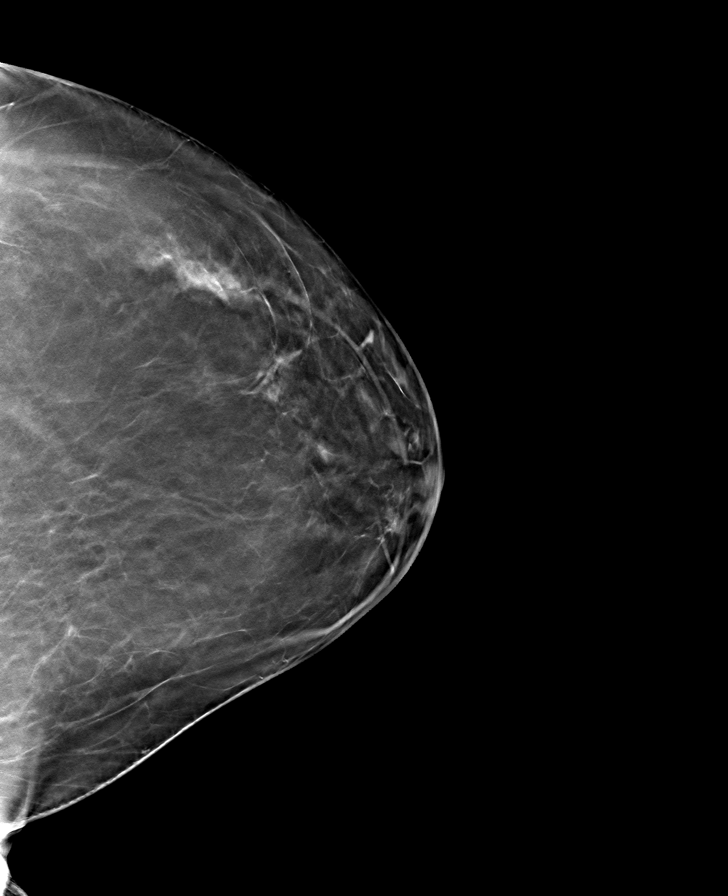

[L MLO tomo · tomo slice 44/87.0]
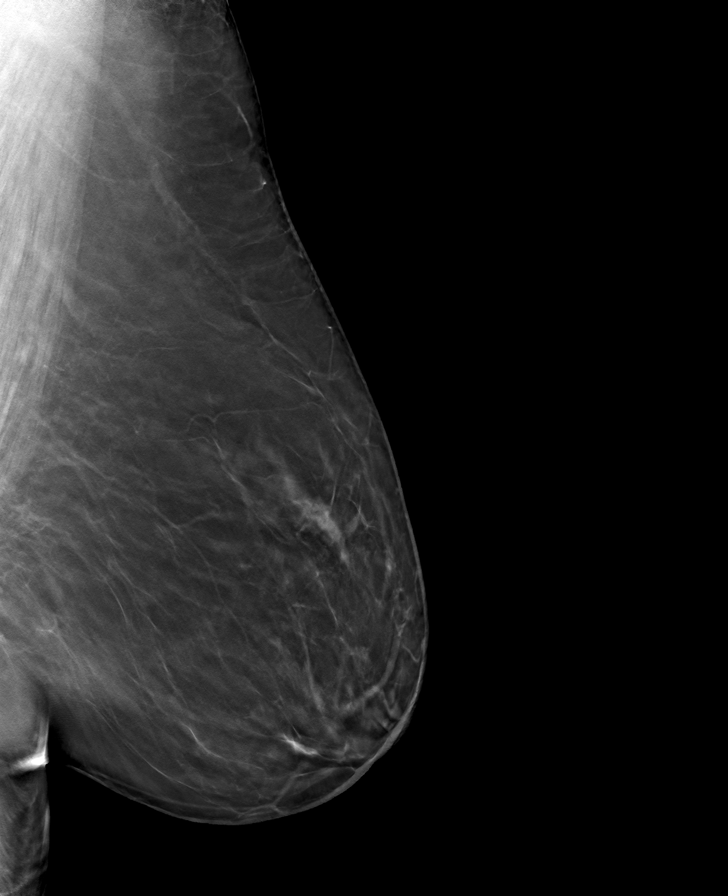

[8 of 24 positions shown; findings below may reference images not displayed]

ACR Breast Density Category b: There are scattered areas of
fibroglandular density.
FINDINGS: There are no findings suspicious for malignancy. Images were
processed with CAD.
IMPRESSION: No mammographic evidence of malignancy. A result letter of this
screening mammogram will be mailed directly to the patient.

RECOMMENDATION:
Screening mammogram in one year. (Code:[TQ])

BI-RADS CATEGORY  1: Negative.

## 2020-02-24 DIAGNOSIS — Z0000 Encounter for general adult medical examination without abnormal findings: Secondary | ICD-10-CM | POA: Diagnosis not present

## 2020-02-24 DIAGNOSIS — G35 Multiple sclerosis: Secondary | ICD-10-CM | POA: Diagnosis not present

## 2020-02-24 DIAGNOSIS — E041 Nontoxic single thyroid nodule: Secondary | ICD-10-CM | POA: Diagnosis not present

## 2020-02-24 DIAGNOSIS — G4733 Obstructive sleep apnea (adult) (pediatric): Secondary | ICD-10-CM | POA: Diagnosis not present

## 2020-02-24 DIAGNOSIS — D696 Thrombocytopenia, unspecified: Secondary | ICD-10-CM | POA: Diagnosis not present

## 2020-02-24 DIAGNOSIS — E782 Mixed hyperlipidemia: Secondary | ICD-10-CM | POA: Diagnosis not present

## 2020-02-24 DIAGNOSIS — Z79899 Other long term (current) drug therapy: Secondary | ICD-10-CM | POA: Diagnosis not present

## 2020-02-24 DIAGNOSIS — T148XXA Other injury of unspecified body region, initial encounter: Secondary | ICD-10-CM | POA: Diagnosis not present

## 2020-02-25 ENCOUNTER — Other Ambulatory Visit: Payer: Self-pay | Admitting: Neurology

## 2020-02-27 ENCOUNTER — Other Ambulatory Visit: Payer: Self-pay

## 2020-02-27 ENCOUNTER — Other Ambulatory Visit (INDEPENDENT_AMBULATORY_CARE_PROVIDER_SITE_OTHER): Payer: Self-pay

## 2020-02-27 DIAGNOSIS — R899 Unspecified abnormal finding in specimens from other organs, systems and tissues: Secondary | ICD-10-CM | POA: Diagnosis not present

## 2020-02-27 DIAGNOSIS — G35 Multiple sclerosis: Secondary | ICD-10-CM

## 2020-02-27 DIAGNOSIS — Z0289 Encounter for other administrative examinations: Secondary | ICD-10-CM

## 2020-02-27 DIAGNOSIS — Z79899 Other long term (current) drug therapy: Secondary | ICD-10-CM | POA: Diagnosis not present

## 2020-02-28 LAB — CBC WITH DIFFERENTIAL/PLATELET
Basophils Absolute: 0 10*3/uL (ref 0.0–0.2)
Basos: 0 %
EOS (ABSOLUTE): 0 10*3/uL (ref 0.0–0.4)
Eos: 1 %
Hematocrit: 38.1 % (ref 34.0–46.6)
Hemoglobin: 12.6 g/dL (ref 11.1–15.9)
Immature Grans (Abs): 0 10*3/uL (ref 0.0–0.1)
Immature Granulocytes: 1 %
Lymphocytes Absolute: 0.3 10*3/uL — ABNORMAL LOW (ref 0.7–3.1)
Lymphs: 7 %
MCH: 33.4 pg — ABNORMAL HIGH (ref 26.6–33.0)
MCHC: 33.1 g/dL (ref 31.5–35.7)
MCV: 101 fL — ABNORMAL HIGH (ref 79–97)
Monocytes Absolute: 0.2 10*3/uL (ref 0.1–0.9)
Monocytes: 5 %
Neutrophils Absolute: 3.5 10*3/uL (ref 1.4–7.0)
Neutrophils: 86 %
Platelets: 91 10*3/uL — CL (ref 150–450)
RBC: 3.77 x10E6/uL (ref 3.77–5.28)
RDW: 12.4 % (ref 11.7–15.4)
WBC: 4 10*3/uL (ref 3.4–10.8)

## 2020-02-28 LAB — COMPREHENSIVE METABOLIC PANEL
ALT: 31 IU/L (ref 0–32)
AST: 22 IU/L (ref 0–40)
Albumin/Globulin Ratio: 2.5 — ABNORMAL HIGH (ref 1.2–2.2)
Albumin: 4.5 g/dL (ref 3.8–4.8)
Alkaline Phosphatase: 82 IU/L (ref 44–121)
BUN/Creatinine Ratio: 17 (ref 12–28)
BUN: 16 mg/dL (ref 8–27)
Bilirubin Total: 0.5 mg/dL (ref 0.0–1.2)
CO2: 26 mmol/L (ref 20–29)
Calcium: 9 mg/dL (ref 8.7–10.3)
Chloride: 102 mmol/L (ref 96–106)
Creatinine, Ser: 0.95 mg/dL (ref 0.57–1.00)
GFR calc Af Amer: 75 mL/min/{1.73_m2} (ref >59)
GFR calc non Af Amer: 65 mL/min/{1.73_m2} (ref >59)
Globulin, Total: 1.8 g/dL (ref 1.5–4.5)
Glucose: 206 mg/dL — ABNORMAL HIGH (ref 65–99)
Potassium: 4.3 mmol/L (ref 3.5–5.2)
Sodium: 140 mmol/L (ref 134–144)
Total Protein: 6.3 g/dL (ref 6.0–8.5)

## 2020-03-02 DIAGNOSIS — Z0000 Encounter for general adult medical examination without abnormal findings: Secondary | ICD-10-CM | POA: Diagnosis not present

## 2020-03-02 DIAGNOSIS — G35 Multiple sclerosis: Secondary | ICD-10-CM | POA: Diagnosis not present

## 2020-03-02 DIAGNOSIS — J449 Chronic obstructive pulmonary disease, unspecified: Secondary | ICD-10-CM | POA: Diagnosis not present

## 2020-03-02 DIAGNOSIS — E6609 Other obesity due to excess calories: Secondary | ICD-10-CM | POA: Diagnosis not present

## 2020-03-02 DIAGNOSIS — F321 Major depressive disorder, single episode, moderate: Secondary | ICD-10-CM | POA: Diagnosis not present

## 2020-03-03 ENCOUNTER — Telehealth: Payer: Self-pay | Admitting: *Deleted

## 2020-03-03 ENCOUNTER — Other Ambulatory Visit: Payer: Self-pay | Admitting: *Deleted

## 2020-03-03 ENCOUNTER — Other Ambulatory Visit: Payer: Self-pay | Admitting: Neurology

## 2020-03-03 DIAGNOSIS — Z79899 Other long term (current) drug therapy: Secondary | ICD-10-CM

## 2020-03-03 DIAGNOSIS — G35 Multiple sclerosis: Secondary | ICD-10-CM

## 2020-03-03 DIAGNOSIS — M542 Cervicalgia: Secondary | ICD-10-CM

## 2020-03-03 NOTE — Telephone Encounter (Signed)
-----   Message from Britt Bottom, MD sent at 03/02/2020  5:56 PM EDT ----- Her lymphocyte and platelet counts are still low, about the same as they were a couple weeks ago.      It is good that they are not any lower.  I would like to recheck a CBC with differential in 4 weeks.

## 2020-03-04 NOTE — Telephone Encounter (Signed)
Helen Sandoval Helen Sandoval: [id number] (exp. 03/04/20 to 04/03/20) order sent to GI. They will reach out to the patient to schedule.

## 2020-03-09 NOTE — Telephone Encounter (Signed)
I got an extension on both CPT codes [phone] & [phone] from 05/29/20 to 06/13/20) patient is scheduled at GI for 29-May-2020.

## 2020-03-24 DIAGNOSIS — M810 Age-related osteoporosis without current pathological fracture: Secondary | ICD-10-CM | POA: Diagnosis not present

## 2020-03-24 DIAGNOSIS — Z79890 Hormone replacement therapy (postmenopausal): Secondary | ICD-10-CM | POA: Diagnosis not present

## 2020-03-24 DIAGNOSIS — Z124 Encounter for screening for malignant neoplasm of cervix: Secondary | ICD-10-CM | POA: Diagnosis not present

## 2020-03-31 ENCOUNTER — Other Ambulatory Visit (INDEPENDENT_AMBULATORY_CARE_PROVIDER_SITE_OTHER): Payer: Self-pay

## 2020-03-31 DIAGNOSIS — Z79899 Other long term (current) drug therapy: Secondary | ICD-10-CM

## 2020-03-31 DIAGNOSIS — G35 Multiple sclerosis: Secondary | ICD-10-CM

## 2020-03-31 DIAGNOSIS — Z0289 Encounter for other administrative examinations: Secondary | ICD-10-CM

## 2020-04-01 ENCOUNTER — Telehealth: Payer: Self-pay | Admitting: *Deleted

## 2020-04-01 LAB — CBC WITH DIFFERENTIAL/PLATELET
Basophils Absolute: 0 10*3/uL (ref 0.0–0.2)
Basos: 0 %
EOS (ABSOLUTE): 0.1 10*3/uL (ref 0.0–0.4)
Eos: 1 %
Hematocrit: 37.9 % (ref 34.0–46.6)
Hemoglobin: 13 g/dL (ref 11.1–15.9)
Immature Grans (Abs): 0 10*3/uL (ref 0.0–0.1)
Immature Granulocytes: 0 %
Lymphocytes Absolute: 0.3 10*3/uL — ABNORMAL LOW (ref 0.7–3.1)
Lymphs: 7 %
MCH: 33.9 pg — ABNORMAL HIGH (ref 26.6–33.0)
MCHC: 34.3 g/dL (ref 31.5–35.7)
MCV: 99 fL — ABNORMAL HIGH (ref 79–97)
Monocytes Absolute: 0.2 10*3/uL (ref 0.1–0.9)
Monocytes: 5 %
Neutrophils Absolute: 4.1 10*3/uL (ref 1.4–7.0)
Neutrophils: 87 %
Platelets: 91 10*3/uL — CL (ref 150–450)
RBC: 3.84 x10E6/uL (ref 3.77–5.28)
RDW: 12.6 % (ref 11.7–15.4)
WBC: 4.7 10*3/uL (ref 3.4–10.8)

## 2020-04-01 NOTE — Progress Notes (Signed)
Platelets at the same level for month- red blood cell count has recovered. Macrocytic anemia has been present for 5 month. Nothing unusual or new.   Does this patient take any folic acid, B 12 supplements? Please share with PCP.

## 2020-04-01 NOTE — Telephone Encounter (Signed)
-----   Message from Larey Seat, MD sent at 04/01/2020  1:12 PM EST ----- Platelets at the same level for month- red blood cell count has recovered. Macrocytic anemia has been present for 5 month. Nothing unusual or new.   Does this patient take any folic acid, B 12 supplements? Please share with PCP.

## 2020-04-06 ENCOUNTER — Other Ambulatory Visit: Payer: Self-pay | Admitting: Obstetrics & Gynecology

## 2020-04-06 ENCOUNTER — Other Ambulatory Visit: Payer: Self-pay | Admitting: *Deleted

## 2020-04-06 ENCOUNTER — Telehealth: Payer: Self-pay | Admitting: *Deleted

## 2020-04-06 DIAGNOSIS — G35 Multiple sclerosis: Secondary | ICD-10-CM

## 2020-04-06 DIAGNOSIS — M810 Age-related osteoporosis without current pathological fracture: Secondary | ICD-10-CM

## 2020-04-06 DIAGNOSIS — Z79899 Other long term (current) drug therapy: Secondary | ICD-10-CM

## 2020-04-06 NOTE — Telephone Encounter (Signed)
-----   Message from Britt Bottom, MD sent at 04/04/2020 11:09 AM EST ----- Labs are about the same as last month.  Platelet count is still low but not any worse.  Lymphocyte count is still low but that is more expected after [place] for a while.  I would like to check again in about a month just to make sure that the labs are stable or improving

## 2020-04-07 DIAGNOSIS — H5213 Myopia, bilateral: Secondary | ICD-10-CM | POA: Diagnosis not present

## 2020-04-07 DIAGNOSIS — H52209 Unspecified astigmatism, unspecified eye: Secondary | ICD-10-CM | POA: Diagnosis not present

## 2020-04-13 ENCOUNTER — Ambulatory Visit: Payer: Medicare HMO | Admitting: Neurology

## 2020-05-05 DIAGNOSIS — G4733 Obstructive sleep apnea (adult) (pediatric): Secondary | ICD-10-CM | POA: Diagnosis not present

## 2020-05-06 ENCOUNTER — Other Ambulatory Visit (INDEPENDENT_AMBULATORY_CARE_PROVIDER_SITE_OTHER): Payer: Self-pay

## 2020-05-06 ENCOUNTER — Other Ambulatory Visit: Payer: Self-pay

## 2020-05-06 DIAGNOSIS — G35 Multiple sclerosis: Secondary | ICD-10-CM | POA: Diagnosis not present

## 2020-05-06 DIAGNOSIS — Z79899 Other long term (current) drug therapy: Secondary | ICD-10-CM | POA: Diagnosis not present

## 2020-05-06 DIAGNOSIS — Z0289 Encounter for other administrative examinations: Secondary | ICD-10-CM

## 2020-05-06 DIAGNOSIS — G35D Multiple sclerosis, unspecified: Secondary | ICD-10-CM

## 2020-05-07 LAB — CBC WITH DIFFERENTIAL/PLATELET
Basophils Absolute: 0 10*3/uL (ref 0.0–0.2)
Basos: 0 %
EOS (ABSOLUTE): 0.1 10*3/uL (ref 0.0–0.4)
Eos: 1 %
Hematocrit: 39.4 % (ref 34.0–46.6)
Hemoglobin: 13.1 g/dL (ref 11.1–15.9)
Immature Grans (Abs): 0 10*3/uL (ref 0.0–0.1)
Immature Granulocytes: 0 %
Lymphocytes Absolute: 0.4 10*3/uL — ABNORMAL LOW (ref 0.7–3.1)
Lymphs: 8 %
MCH: 32.7 pg (ref 26.6–33.0)
MCHC: 33.2 g/dL (ref 31.5–35.7)
MCV: 98 fL — ABNORMAL HIGH (ref 79–97)
Monocytes Absolute: 0.2 10*3/uL (ref 0.1–0.9)
Monocytes: 5 %
Neutrophils Absolute: 3.9 10*3/uL (ref 1.4–7.0)
Neutrophils: 86 %
Platelets: 96 10*3/uL — CL (ref 150–450)
RBC: 4.01 x10E6/uL (ref 3.77–5.28)
RDW: 13 % (ref 11.7–15.4)
WBC: 4.6 10*3/uL (ref 3.4–10.8)

## 2020-05-14 ENCOUNTER — Ambulatory Visit
Admission: RE | Admit: 2020-05-14 | Discharge: 2020-05-14 | Disposition: A | Discharge status: Home / Self Care | Payer: Medicare HMO | Source: Ambulatory Visit | Attending: Neurology | Admitting: Neurology

## 2020-05-14 ENCOUNTER — Other Ambulatory Visit: Payer: Self-pay

## 2020-05-14 DIAGNOSIS — M542 Cervicalgia: Secondary | ICD-10-CM

## 2020-05-14 DIAGNOSIS — G35 Multiple sclerosis: Secondary | ICD-10-CM | POA: Diagnosis not present

## 2020-05-14 MED ORDER — GADOBENATE DIMEGLUMINE 529 MG/ML IV SOLN
19.0000 mL | Freq: Once | INTRAVENOUS | Status: AC | PRN
  Administered 2020-05-14: 14:00:00 19 mL via INTRAVENOUS

## 2020-05-20 DIAGNOSIS — Z1159 Encounter for screening for other viral diseases: Secondary | ICD-10-CM | POA: Diagnosis not present

## 2020-05-25 DIAGNOSIS — Z800 Family history of malignant neoplasm of digestive organs: Secondary | ICD-10-CM | POA: Diagnosis not present

## 2020-05-25 DIAGNOSIS — D128 Benign neoplasm of rectum: Secondary | ICD-10-CM | POA: Diagnosis not present

## 2020-05-25 DIAGNOSIS — Z86010 Personal history of colonic polyps: Secondary | ICD-10-CM | POA: Diagnosis not present

## 2020-05-25 DIAGNOSIS — D123 Benign neoplasm of transverse colon: Secondary | ICD-10-CM | POA: Diagnosis not present

## 2020-05-25 DIAGNOSIS — D122 Benign neoplasm of ascending colon: Secondary | ICD-10-CM | POA: Diagnosis not present

## 2020-05-25 DIAGNOSIS — K5730 Diverticulosis of large intestine without perforation or abscess without bleeding: Secondary | ICD-10-CM | POA: Diagnosis not present

## 2020-05-27 DIAGNOSIS — D122 Benign neoplasm of ascending colon: Secondary | ICD-10-CM | POA: Diagnosis not present

## 2020-05-27 DIAGNOSIS — D128 Benign neoplasm of rectum: Secondary | ICD-10-CM | POA: Diagnosis not present

## 2020-05-27 DIAGNOSIS — D123 Benign neoplasm of transverse colon: Secondary | ICD-10-CM | POA: Diagnosis not present

## 2020-06-22 ENCOUNTER — Other Ambulatory Visit: Payer: Self-pay | Admitting: Neurology

## 2020-06-23 ENCOUNTER — Ambulatory Visit
Admission: RE | Admit: 2020-06-23 | Discharge: 2020-06-23 | Disposition: A | Discharge status: Home / Self Care | Payer: Medicare HMO | Source: Ambulatory Visit | Attending: Obstetrics & Gynecology | Admitting: Obstetrics & Gynecology

## 2020-06-23 ENCOUNTER — Other Ambulatory Visit: Payer: Self-pay

## 2020-06-23 DIAGNOSIS — M810 Age-related osteoporosis without current pathological fracture: Secondary | ICD-10-CM

## 2020-06-23 DIAGNOSIS — M85852 Other specified disorders of bone density and structure, left thigh: Secondary | ICD-10-CM | POA: Diagnosis not present

## 2020-08-03 DIAGNOSIS — G4733 Obstructive sleep apnea (adult) (pediatric): Secondary | ICD-10-CM | POA: Diagnosis not present

## 2020-08-05 ENCOUNTER — Other Ambulatory Visit: Payer: Self-pay | Admitting: Neurology

## 2020-08-12 ENCOUNTER — Encounter: Payer: Self-pay | Admitting: Neurology

## 2020-08-12 ENCOUNTER — Ambulatory Visit: Payer: Medicare HMO | Admitting: Neurology

## 2020-08-12 VITALS — BP 151/97 | HR 93 | Ht 64.0 in | Wt 221.5 lb

## 2020-08-12 DIAGNOSIS — R5383 Other fatigue: Secondary | ICD-10-CM | POA: Diagnosis not present

## 2020-08-12 DIAGNOSIS — E559 Vitamin D deficiency, unspecified: Secondary | ICD-10-CM | POA: Diagnosis not present

## 2020-08-12 DIAGNOSIS — Z79899 Other long term (current) drug therapy: Secondary | ICD-10-CM | POA: Diagnosis not present

## 2020-08-12 DIAGNOSIS — G35D Multiple sclerosis, unspecified: Secondary | ICD-10-CM

## 2020-08-12 DIAGNOSIS — G4733 Obstructive sleep apnea (adult) (pediatric): Secondary | ICD-10-CM | POA: Diagnosis not present

## 2020-08-12 DIAGNOSIS — G35 Multiple sclerosis: Secondary | ICD-10-CM

## 2020-08-12 MED ORDER — BUSPIRONE HCL 15 MG PO TABS: 15.0000 mg | ORAL_TABLET | Freq: Two times a day (BID) | ORAL | 3 refills | Status: DC

## 2020-08-12 MED ORDER — PHENTERMINE HCL 37.5 MG PO CAPS: 37.5000 mg | ORAL_CAPSULE | Freq: Every morning | ORAL | 5 refills | Status: DC

## 2020-08-12 NOTE — Progress Notes (Signed)
[facility]  PATIENT: Helen Sandoval DOB: [birth year]  REFERRING DOCTOR OR PCP:  Otto Herb. ([facility])   Sinclair Ship (new PCP at [facility]) SOURCE: patient and records from [facility]  _________________________________   HISTORICAL  CHIEF COMPLAINT:  Chief Complaint  Patient presents with  . Follow-up    RM 12, alone. Last seen 02/12/20. On [place] for MS. Sleep apnea- bipap ([employer]). Having more joint pain/balance issues.     HISTORY OF PRESENT ILLNESS:  Helen Sandoval is a 61 yo woman who was diagnosed with relapsing remitting MS in 2015.     Update 08/12/2021: She has completed the second year of [place] in April 2021.   She did well but lymphocyte count was low at 0.1.  Therefore, she did Valtrex for few months.  Lymphocyte counts December 2022 was 0.4.   She now reports tremors and fatigue are worse.     Her balance is a little worse.   Tremors come and goes   She had colonoscopy and had one large and 10 small polyps (all benign)/    She will have a repeat study in a year.    Tremors are a little worse.  Last visit we checked and TSH was fine though T3U was mildly reduced   Her mom has had tremors since her 80's.   TSH had been mildly elevated earlier 2021.   She is taking B12 an Vit D.     She has osteoporosis on recent Bone density.     Her headaches are doing wore again.   They are located in the back of the head.    She is on BiPAP 15/10 and has excellent compliance on the d/l though AHI was mildly high at 7.1.    Gait and strength are fine.   Sensation is doing the same with mild leg dysesthesia. .  She just takes gabapentin bid now for the leg pain.   She has new glasses which helped vision.   Bladder function is good.    She is noting more fatigue.   Her 11 yo mother (post-op colon surgery) lives with her and she is doing all her chores.  She is sleeping worse but gets 6 hours most nights.  She has an occasioanl day she sleeps much  more.  She uses BIPAP nightly Phentermine may help her fatigue and she has lost weight.   She has had some tremors and sweating (day and night).   No change in medications.   We will check TSH/T4 today.    She had her two Covid vaccinations (Feb/March) plus a booster Therapist, music).       MS History:    In 2008, she had headaches and poor balance and was told the MRI had some white matter foci at that time.    She had spinal stenosis and needed surgery in 2014.  The surgeon felt her gait and other issues were decreased out of proportion to the extent of spine disease and referred her to Neurology.   Besides gait issues, she also had double vision, blurry vision, cognitive difficulty and incontinence in early 2015. She saw Dr. Pamalee Leyden in [place]. An MRI of the brain, CSF and visual evoked potentials were consistent with multiple sclerosis.    Initially, she was placed on Tecfidera. Due to progression with more neurologic symptoms, she was switched to Tysabri.   Her last MRI was done 10/2014 but we do not have the images.    Imaging:  MRI brain 05/14/2020 shows T2/FLAIR hyperintense foci in the hemispheres, pons and left basal ganglia.  Most of the foci are nonspecific.  A few are periventricular and radially oriented.  The focus in the left basal ganglia could also represent ischemic sequela.   None of the foci appear to be acute and they do not enhance.   There are no new lesions compared with 05/12/2018 MRI.  MRI of the cervical spine shows 1 or 2 small subtle foci within the spinal cord adjacent to C4-C5 and C6.  These have been seen on previous MRIs.  Though nonspecific, this is consistent with a chronic demyelinating plaque.  Minimal chronic compressive myelopathic signal cannot be ruled out. There are no new lesions and no enhancing lesions. Prior C4-C6 ACDF. There is no nerve root compression or spinal stenosis at these levels. Mild spinal stenosis and mild foraminal narrowing at C3-C4 and C6-C7  that does not lead to any nerve root compression.  Degenerative changes are stable compared to the 2020 MRI.  REVIEW OF SYSTEMS: Constitutional: No fevers, chills, sweats, or change in appetite.   She notes fatigue Eyes: Reports visual changes and double vision.  No eye pain Ear, nose and throat: No hearing loss, ear pain, nasal congestion, sore throat Cardiovascular: No chest pain, palpitations Respiratory: No shortness of breath at rest or with exertion.   She has snoring and coughing but no wheezes GastrointestinaI: No nausea, vomiting, abdominal pain.  She notes diarrhea and some fecal incontinence Genitourinary: No dysuria, urinary retention or frequency. She has had some incontinence Musculoskeletal: No neck pain, back pain Integumentary: No rash, pruritus, skin lesions Neurological: as above Psychiatric: Some depression at this time.  Some anxiety Endocrine: No palpitations, diaphoresis, change in appetite, change in weigh or increased thirst Hematologic/Lymphatic: No anemia, purpura, petechiae. Allergic/Immunologic: No itchy/runny eyes, nasal congestion, recent allergic reactions, rashes  ALLERGIES: Allergies  Allergen Reactions  . Clindamycin Anaphylaxis  . Indomethacin Hives  . Levofloxacin Hives  . Penicillins Anaphylaxis  . Penicillins Cross Reactors Anaphylaxis  . Sulfa Antibiotics Swelling    Blister (steven john syndrome)  . Levaquin [Levofloxacin Hemihydrate] Hives  . Lipitor  [Atorvastatin Calcium]   . Sulfamethoxazole-Trimethoprim   . Zocor [Simvastatin] Other (See Comments)    Other reaction(s): MUSCLE PAIN Muscle aches    HOME MEDICATIONS:  Current Outpatient Medications:  .  alendronate (FOSAMAX) 70 MG tablet, Take 70 mg by mouth once a week. Take with a full glass of water on an empty stomach., Disp: , Rfl:  .  APPLE CIDER VINEGAR PO, Take 625 mg by mouth daily. 2 caps per day, Disp: , Rfl:  .  aspirin EC 81 MG tablet, Take 81 mg by mouth every other  day., Disp: , Rfl:  .  Biotin 10000 MCG TABS, Take by mouth daily. , Disp: , Rfl:  .  buPROPion (WELLBUTRIN SR) 150 MG 12 hr tablet, Take 150 mg by mouth 2 (two) times daily. , Disp: , Rfl:  .  Chlorphen-PE-Acetaminophen (NOREL AD) 4-10-325 MG TABS, Take 1 tablet by mouth as needed., Disp: , Rfl:  .  cholecalciferol (VITAMIN D) 1000 UNITS tablet, Take 5,000 Units by mouth daily., Disp: , Rfl:  .  Cladribine (MAVENCLAD, 9 TABS, PO), Take by mouth., Disp: , Rfl:  .  co-enzyme Q-10 30 MG capsule, Take 100 mg by mouth daily. , Disp: , Rfl:  .  cyclobenzaprine (FLEXERIL) 5 MG tablet, Take 1 tablet (5 mg total) by mouth 3 (three) times daily  as needed., Disp: 90 tablet, Rfl: 5 .  DULERA 200-5 MCG/ACT AERO, Inhale 2 puffs into the lungs as needed., Disp: , Rfl:  .  estradiol (CLIMARA - DOSED IN MG/24 HR) 0.0375 mg/24hr patch, estradiol 0.0375 mg/24 hr weekly transdermal patch, Disp: , Rfl:  .  ezetimibe (ZETIA) 10 MG tablet, Take 10 mg by mouth daily. , Disp: , Rfl:  .  gabapentin (NEURONTIN) 300 MG capsule, TAKE 1 CAPSULE BY MOUTH THREE TIMES DAILY, Disp: 270 capsule, Rfl: 3 .  IRON-FOLIC ACID-VIT S28 PO, Take by mouth., Disp: , Rfl:  .  Krill Oil 1000 MG CAPS, Take 1,000 mg by mouth daily. , Disp: , Rfl:  .  MILK THISTLE PO, Take by mouth., Disp: , Rfl:  .  MYRBETRIQ 50 MG TB24 tablet, Take 1 tablet by mouth once daily, Disp: 90 tablet, Rfl: 3 .  omeprazole (PRILOSEC) 20 MG capsule, Take 20 mg by mouth daily. , Disp: , Rfl:  .  PSYLLIUM HUSK PO, Take by mouth., Disp: , Rfl:  .  traZODone (DESYREL) 100 MG tablet, TAKE 1 TABLET BY MOUTH AT BEDTIME, Disp: 90 tablet, Rfl: 1 .  TURMERIC PO, Take 1 capsule by mouth every morning. +Blackpepper 600mg /5mg , Disp: , Rfl:  .  venlafaxine XR (EFFEXOR-XR) 150 MG 24 hr capsule, Take 2 capsules by mouth daily., Disp: , Rfl:  .  busPIRone (BUSPAR) 15 MG tablet, Take 1 tablet (15 mg total) by mouth 2 (two) times daily., Disp: 180 tablet, Rfl: 3 .  phentermine 37.5 MG  capsule, Take 1 capsule (37.5 mg total) by mouth every morning., Disp: 30 capsule, Rfl: 5  PAST MEDICAL HISTORY: Past Medical History:  Diagnosis Date  . Ankle fracture   . Osteopenia   . Scarlet fever   . Shingles   . Sleep apnea   . Spina bifida   . Vitamin D deficiency     PAST SURGICAL HISTORY: Past Surgical History:  Procedure Laterality Date  . ABDOMINAL HYSTERECTOMY    . BREAST BIOPSY Right    20+ yrs ago    FAMILY HISTORY: No family history on file.  SOCIAL HISTORY:  Social History   Socioeconomic History  . Marital status: Divorced    Spouse name: Not on file  . Number of children: Not on file  . Years of education: Not on file  . Highest education level: Not on file  Occupational History  . Not on file  Tobacco Use  . Smoking status: Current Some Day Smoker  . Smokeless tobacco: Never Used  Substance and Sexual Activity  . Alcohol use: No  . Drug use: No  . Sexual activity: Not on file  Other Topics Concern  . Not on file  Social History Narrative  . Not on file   Social Determinants of Health   Financial Resource Strain: Not on file  Food Insecurity: Not on file  Transportation Needs: Not on file  Physical Activity: Not on file  Stress: Not on file  Social Connections: Not on file  Intimate Partner Violence: Not on file     PHYSICAL EXAM  Vitals:   08/12/20 1248  BP: (!) 151/97  Pulse: 93  Weight: 221 lb 8 oz (100.5 kg)  Height: 5\' 4"  (1.626 m)    Body mass index is 38.02 kg/m.   General: The patient is well-developed and well-nourished and in no acute distress  Neck: The neck is supple with good ROM and nontender.      Neurologic Exam  Mental status: The patient is alert and oriented x 3 at the time of the examination.  During the interaction today, she has apparent normal recent and remote memory, with an apparently normal attention span and concentration ability.   Speech is normal.  Cranial nerves: Extraocular movements  appear normal.  Facial strength was normal.  Trapezius strength is normal.  No dysarthria is noted.  Hearing appears normal and symmetric.  Motor: She has a low amplitude fast tremor in the hands.  Muscle bulk is normal.   Tone is normal. Strength is  5 / 5 in all 4 extremities.   Sensory: She reports slightly reduced sensation to vibration in the right leg    Coordination:  finger-nose-finger is performed well.  Heel-to-shin is performed well.  Gait and station: Station is normal.  She has a mildly reduced stride..  Tandem is wide.  Romberg sign is negative.  Reflexes: Deep tendon reflexes are symmetric and 2+ arms and 3+ knees with spread.   There is no ankle clonus..    ASSESSMENT AND PLAN    1. Multiple sclerosis ([place])   2. High risk medication use   3. Other fatigue   4. Vitamin D deficiency   5. OSA treated with BiPAP     1.  She completed her second course of [place] April 2021..  The lymphocyte count was low at 2 months and she went on Valtrex for few months..  We will recheck the CBC with differential and also check CMP and TSH today.  MRI of the brain and cervical spine December 2021 was stable.  She is experiencing some new symptoms but they do not appear to be due to an exacerbation.   2.   Continue BiPAP 15/10  She uses it nightly.  Advised to try to lose some weight. 3.   Continue Phentermine 37.5 mg in the morning for sleepiness, weight loss and fatigue.  Return in 6 months or sooner if there are new or worsening neurologic symptoms.   Richard A. Felecia Shelling, MD, PhD 02/11/1942, 7:40 PM Certified in Neurology, Clinical Neurophysiology, Sleep Medicine, Pain Medicine and Neuroimaging . [facility] [address] [place], [place] [address] [phone]

## 2020-08-13 ENCOUNTER — Telehealth: Payer: Self-pay | Admitting: *Deleted

## 2020-08-13 DIAGNOSIS — D696 Thrombocytopenia, unspecified: Secondary | ICD-10-CM

## 2020-08-13 LAB — COMPREHENSIVE METABOLIC PANEL
ALT: 30 IU/L (ref 0–32)
AST: 23 IU/L (ref 0–40)
Albumin/Globulin Ratio: 2.8 — ABNORMAL HIGH (ref 1.2–2.2)
Albumin: 4.4 g/dL (ref 3.8–4.8)
Alkaline Phosphatase: 89 IU/L (ref 44–121)
BUN/Creatinine Ratio: 12 (ref 12–28)
BUN: 13 mg/dL (ref 8–27)
Bilirubin Total: 0.4 mg/dL (ref 0.0–1.2)
CO2: 21 mmol/L (ref 20–29)
Calcium: 8.8 mg/dL (ref 8.7–10.3)
Chloride: 100 mmol/L (ref 96–106)
Creatinine, Ser: 1.06 mg/dL — ABNORMAL HIGH (ref 0.57–1.00)
Globulin, Total: 1.6 g/dL (ref 1.5–4.5)
Glucose: 256 mg/dL — ABNORMAL HIGH (ref 65–99)
Potassium: 4.1 mmol/L (ref 3.5–5.2)
Sodium: 139 mmol/L (ref 134–144)
Total Protein: 6 g/dL (ref 6.0–8.5)
eGFR: 59 mL/min/{1.73_m2} — ABNORMAL LOW (ref >59)

## 2020-08-13 LAB — CBC WITH DIFFERENTIAL/PLATELET
Basophils Absolute: 0 10*3/uL (ref 0.0–0.2)
Basos: 0 %
EOS (ABSOLUTE): 0.1 10*3/uL (ref 0.0–0.4)
Eos: 2 %
Hematocrit: 35.7 % (ref 34.0–46.6)
Hemoglobin: 12 g/dL (ref 11.1–15.9)
Immature Grans (Abs): 0 10*3/uL (ref 0.0–0.1)
Immature Granulocytes: 1 %
Lymphocytes Absolute: 0.4 10*3/uL — ABNORMAL LOW (ref 0.7–3.1)
Lymphs: 10 %
MCH: 33.1 pg — ABNORMAL HIGH (ref 26.6–33.0)
MCHC: 33.6 g/dL (ref 31.5–35.7)
MCV: 99 fL — ABNORMAL HIGH (ref 79–97)
Monocytes Absolute: 0.3 10*3/uL (ref 0.1–0.9)
Monocytes: 6 %
Neutrophils Absolute: 3.7 10*3/uL (ref 1.4–7.0)
Neutrophils: 81 %
Platelets: 78 10*3/uL — CL (ref 150–450)
RBC: 3.62 x10E6/uL — ABNORMAL LOW (ref 3.77–5.28)
RDW: 13.3 % (ref 11.7–15.4)
WBC: 4.5 10*3/uL (ref 3.4–10.8)

## 2020-08-13 LAB — THYROID PANEL WITH TSH
Free Thyroxine Index: 1.5 (ref 1.2–4.9)
T3 Uptake Ratio: 25 % (ref 24–39)
T4, Total: 5.8 ug/dL (ref 4.5–12.0)
TSH: 4.76 u[IU]/mL — ABNORMAL HIGH (ref 0.450–4.500)

## 2020-08-13 LAB — VITAMIN D 25 HYDROXY (VIT D DEFICIENCY, FRACTURES): Vit D, 25-Hydroxy: 43 ng/mL (ref 30.0–100.0)

## 2020-08-13 NOTE — Telephone Encounter (Signed)
-----   Message from Britt Bottom, MD sent at 08/13/2020  3:00 PM EDT ----- The lymphocyte count is okay, still little low but similar to last time we checked.  He can take up to a year and a half for lymphocytes to get back into the normal range after the second year.  The platelet count is still reduced and actually a little lower than last time.  This is most likely from [facility] but I would like her to see hematology to make sure that there is not an alternative explanation or if they would recommend doing anything differently.  Referral for "persistent thrombocytopenia following cladribine therapy)  Glucose was elevated, higher than last time.  We can send labs to her primary care physician as he may want to do further testing for diabetes.  Vitamin D was fine.  The TSH was slightly high which can be seen with hypothyroidism but it is better than it was a couple times ago

## 2020-08-14 ENCOUNTER — Telehealth: Payer: Self-pay | Admitting: Physician Assistant

## 2020-08-14 NOTE — Telephone Encounter (Signed)
Received a new hem referral from Dr. Felecia Shelling for thrombocytopenia. Helen Sandoval has been cld and scheduled to see Murray Hodgkins on 3/28 at 1pm. Pt aware to arrive 20 minutes early.

## 2020-08-17 ENCOUNTER — Inpatient Hospital Stay: Payer: Medicare HMO | Admitting: Physician Assistant

## 2020-08-17 ENCOUNTER — Encounter: Payer: Self-pay | Admitting: Physician Assistant

## 2020-08-17 ENCOUNTER — Other Ambulatory Visit: Payer: Self-pay

## 2020-08-17 ENCOUNTER — Inpatient Hospital Stay: Payer: Medicare HMO | Attending: Physician Assistant

## 2020-08-17 VITALS — BP 149/81 | HR 86 | Temp 98.4°F | Resp 12 | Wt 221.0 lb

## 2020-08-17 DIAGNOSIS — G35 Multiple sclerosis: Secondary | ICD-10-CM | POA: Diagnosis not present

## 2020-08-17 DIAGNOSIS — E559 Vitamin D deficiency, unspecified: Secondary | ICD-10-CM | POA: Diagnosis not present

## 2020-08-17 DIAGNOSIS — D696 Thrombocytopenia, unspecified: Secondary | ICD-10-CM | POA: Insufficient documentation

## 2020-08-17 DIAGNOSIS — F17210 Nicotine dependence, cigarettes, uncomplicated: Secondary | ICD-10-CM | POA: Diagnosis not present

## 2020-08-17 DIAGNOSIS — M810 Age-related osteoporosis without current pathological fracture: Secondary | ICD-10-CM

## 2020-08-17 LAB — CBC WITH DIFFERENTIAL (CANCER CENTER ONLY)
Abs Immature Granulocytes: 0.03 10*3/uL (ref 0.00–0.07)
Basophils Absolute: 0 10*3/uL (ref 0.0–0.1)
Basophils Relative: 0 %
Eosinophils Absolute: 0.1 10*3/uL (ref 0.0–0.5)
Eosinophils Relative: 1 %
HCT: 34.2 % — ABNORMAL LOW (ref 36.0–46.0)
Hemoglobin: 12.1 g/dL (ref 12.0–15.0)
Immature Granulocytes: 1 %
Lymphocytes Relative: 10 %
Lymphs Abs: 0.5 10*3/uL — ABNORMAL LOW (ref 0.7–4.0)
MCH: 33.5 pg (ref 26.0–34.0)
MCHC: 35.4 g/dL (ref 30.0–36.0)
MCV: 94.7 fL (ref 80.0–100.0)
Monocytes Absolute: 0.3 10*3/uL (ref 0.1–1.0)
Monocytes Relative: 6 %
Neutro Abs: 3.9 10*3/uL (ref 1.7–7.7)
Neutrophils Relative %: 82 %
Platelet Count: 83 10*3/uL — ABNORMAL LOW (ref 150–400)
RBC: 3.61 MIL/uL — ABNORMAL LOW (ref 3.87–5.11)
RDW: 13.1 % (ref 11.5–15.5)
WBC Count: 4.8 10*3/uL (ref 4.0–10.5)
nRBC: 0 % (ref 0.0–0.2)

## 2020-08-17 LAB — VITAMIN B12: Vitamin B-12: 651 pg/mL (ref 180–914)

## 2020-08-17 LAB — FOLATE: Folate: 23.1 ng/mL (ref >5.9)

## 2020-08-17 LAB — SAVE SMEAR(SSMR), FOR PROVIDER SLIDE REVIEW

## 2020-08-17 LAB — PLATELET BY CITRATE

## 2020-08-17 NOTE — Progress Notes (Unsigned)
[place] Telephone:([phone]   Fax:([phone]) [place] NOTE  Patient Care Team: Merrilee Seashore, MD as PCP - General (Internal Medicine)  Hematological/Oncological History 1) Labs since starting Cladribine [facility]) for multiple sclerosis in February 2020:  08/29/2018: WBC 5.2, Hgb 13.1, Plt 135K, Lymphocyte 0.2 01/03/2019: WBC 4.5, Hgb 13.8, Plt 147K, Lymphocyte 0.3 06/05/2019: WBC 5.9, Hgb 13.7, Plt 145K, Lymphocyte 0.7 10/07/2019: WBC 4.0, Hgb 13.1, Plt 102K, Lymphocyte 0.1 02/12/2020: WBC 4.1, Hgb 12.6, Plt 89K, Lymphocyte 0.3 02/27/2020: WBC 4.0, Hgb 12.6, Plt 91K, Lymphocyte 0.3 03/31/2020: WBC 4.7, Hgb 13.0, Plt 91K, Lymphocyte 0.3 05/06/2020: WBC 4.6, Hgb 13.1, Plt 98K, Lymphocyte 0.4 08/12/2020: WBC 4.5, Hgb 12.0, Pkt 78K, Lymphocyte 0.4  CHIEF COMPLAINTS/PURPOSE OF CONSULTATION:  "Thrombocytopenia "  HISTORY OF PRESENTING ILLNESS:  Cassandria Drew 60 y.o. female with medical history significant for multiple sclerosis, osteoporosis, obstructive sleep apnea and vitamin D deficiency. Patient is unaccompanied for this visit.   On review of the previous records, patient was found to have thrombocytopenia and lymphocytopenia following initiation of Cladribine ([place]) for multiple sclerosis. She has completed the second year of Cladribine ([place]) in April 2021.   On exam today , she reports that her energy is low although she can complete her ADLs on her own. She requires frequent resting. She has a good appetite and denies any significant weight loss. She denies any nausea, vomiting or abdominal pain. She denies any changes to her bowel habits. She reports intermittent episodes of nose bleedings that resolve on its own. She denies any other signs of bleeding including hematochezia, melena, hematuria or gum bleeding. She endorses easy bruising mainly in the upper and lower extremities. Patient reports that her tremors and gait has worsened over  the last few months. She denies any recent falls. Patient denies any fevers, chills, shortness of breath, chest pain, cough, neuropathy or skin changes. She has no other complaints. Rest of the 10 point ROS is below.   MEDICAL HISTORY:  Past Medical History:  Diagnosis Date  . Ankle fracture   . Osteopenia   . Scarlet fever   . Shingles   . Sleep apnea   . Spina bifida   . Vitamin D deficiency     SURGICAL HISTORY: Past Surgical History:  Procedure Laterality Date  . ABDOMINAL HYSTERECTOMY    . BREAST BIOPSY Right    20+ yrs ago    SOCIAL HISTORY: Social History   Socioeconomic History  . Marital status: Divorced    Spouse name: Not on file  . Number of children: Not on file  . Years of education: Not on file  . Highest education level: Not on file  Occupational History  . Not on file  Tobacco Use  . Smoking status: Current Some Day Smoker  . Smokeless tobacco: Never Used  . Tobacco comment: 1 pk per week  Substance and Sexual Activity  . Alcohol use: No  . Drug use: No  . Sexual activity: Not on file  Other Topics Concern  . Not on file  Social History Narrative  . Not on file   Social Determinants of Health   Financial Resource Strain: Not on file  Food Insecurity: Not on file  Transportation Needs: Not on file  Physical Activity: Not on file  Stress: Not on file  Social Connections: Not on file  Intimate Partner Violence: Not on file    FAMILY HISTORY: No family history on file.  ALLERGIES:  is allergic to  clindamycin, indomethacin, levofloxacin, penicillins, penicillins cross reactors, sulfa antibiotics, levaquin [levofloxacin hemihydrate], lipitor  [atorvastatin calcium], sulfamethoxazole-trimethoprim, and zocor [simvastatin].  MEDICATIONS:  Current Outpatient Medications  Medication Sig Dispense Refill  . alendronate (FOSAMAX) 70 MG tablet Take 70 mg by mouth once a week. Take with a full glass of water on an empty stomach.    . APPLE CIDER  VINEGAR PO Take 625 mg by mouth daily. 2 caps per day    . aspirin EC 81 MG tablet Take 81 mg by mouth every other day.    . Biotin 10000 MCG TABS Take by mouth daily.     Marland Kitchen buPROPion (WELLBUTRIN SR) 150 MG 12 hr tablet Take 150 mg by mouth 2 (two) times daily.     . busPIRone (BUSPAR) 15 MG tablet Take 1 tablet (15 mg total) by mouth 2 (two) times daily. 180 tablet 3  . Chlorphen-PE-Acetaminophen (NOREL AD) 4-10-325 MG TABS Take 1 tablet by mouth as needed.    . cholecalciferol (VITAMIN D) 1000 UNITS tablet Take 5,000 Units by mouth daily.    . Cladribine (MAVENCLAD, 9 TABS, PO) Take by mouth.    . co-enzyme Q-10 30 MG capsule Take 100 mg by mouth daily.     . cyclobenzaprine (FLEXERIL) 5 MG tablet Take 1 tablet (5 mg total) by mouth 3 (three) times daily as needed. 90 tablet 5  . DULERA 200-5 MCG/ACT AERO Inhale 2 puffs into the lungs as needed.    Marland Kitchen estradiol (CLIMARA - DOSED IN MG/24 HR) 0.0375 mg/24hr patch estradiol 0.0375 mg/24 hr weekly transdermal patch    . ezetimibe (ZETIA) 10 MG tablet Take 10 mg by mouth daily.     Marland Kitchen gabapentin (NEURONTIN) 300 MG capsule TAKE 1 CAPSULE BY MOUTH THREE TIMES DAILY 270 capsule 3  . IRON-FOLIC ACID-VIT W09 PO Take by mouth.    Javier Docker Oil 1000 MG CAPS Take 1,000 mg by mouth daily.     Marland Kitchen MILK THISTLE PO Take by mouth.    Marland Kitchen MYRBETRIQ 50 MG TB24 tablet Take 1 tablet by mouth once daily 90 tablet 3  . omeprazole (PRILOSEC) 20 MG capsule Take 20 mg by mouth daily.     Marland Kitchen OVER THE COUNTER MEDICATION Liver Detox    . phentermine 37.5 MG capsule Take 1 capsule (37.5 mg total) by mouth every morning. 30 capsule 5  . PSYLLIUM HUSK PO Take by mouth.    . traZODone (DESYREL) 100 MG tablet TAKE 1 TABLET BY MOUTH AT BEDTIME 90 tablet 1  . TURMERIC PO Take 1 capsule by mouth every morning. +Blackpepper 600mg /5mg     . venlafaxine XR (EFFEXOR-XR) 150 MG 24 hr capsule Take 2 capsules by mouth daily.     No current facility-administered medications for this visit.     REVIEW OF SYSTEMS:   Constitutional: ( - ) fevers, ( - )  chills , ( - ) night sweats Eyes: ( - ) blurriness of vision, ( - ) double vision, ( - ) watery eyes Ears, nose, mouth, throat, and face: ( - ) mucositis, ( - ) sore throat Respiratory: ( - ) cough, ( - ) dyspnea, ( - ) wheezes Cardiovascular: ( - ) palpitation, ( - ) chest discomfort, ( - ) lower extremity swelling Gastrointestinal:  ( - ) nausea, ( - ) heartburn, ( - ) change in bowel habits Skin: ( - ) abnormal skin rashes Lymphatics: ( - ) new lymphadenopathy, ( + ) easy bruising Neurological: ( - )  numbness, ( - ) tingling, ( - ) new weaknesses Behavioral/Psych: ( - ) mood change, ( - ) new changes  All other systems were reviewed with the patient and are negative.  PHYSICAL EXAMINATION: ECOG PERFORMANCE STATUS: 1 - Symptomatic but completely ambulatory  There were no vitals filed for this visit. There were no vitals filed for this visit.  GENERAL: well appearing female in NAD  SKIN: skin color, texture, turgor are normal, or significant lesions. Erythematous skin involving the cheeks.   EYES: conjunctiva are pink and non-injected, sclera clear OROPHARYNX: no exudate, no erythema; lips, buccal mucosa, and tongue normal  NECK: supple, non-tender LYMPH:  no palpable lymphadenopathy in the cervical, axillary or supraclavicular lymph nodes.  LUNGS: clear to auscultation and percussion with normal breathing effort HEART: regular rate & rhythm and no murmurs and no lower extremity edema ABDOMEN: soft, non-tender, non-distended, normal bowel sounds Musculoskeletal: no cyanosis of digits and no clubbing  PSYCH: alert & oriented x 3, fluent speech NEURO: no focal motor/sensory deficits  LABORATORY DATA:  I have reviewed the data as listed CBC Latest Ref Rng & Units 08/12/2020 05/06/2020 03/31/2020  WBC 3.4 - 10.8 x10E3/uL 4.5 4.6 4.7  Hemoglobin 11.1 - 15.9 g/dL 12.0 13.1 13.0  Hematocrit 34.0 - 46.6 % 35.7 39.4 37.9   Platelets 150 - 450 x10E3/uL 78(LL) 96(LL) 91(LL)    CMP Latest Ref Rng & Units 08/12/2020 02/27/2020 02/12/2020  Glucose 65 - 99 mg/dL 256(H) 206(H) 142(H)  BUN 8 - 27 mg/dL 13 16 16   Creatinine 0.57 - 1.00 mg/dL 1.06(H) 0.95 1.08(H)  Sodium 134 - 144 mmol/L 139 140 141  Potassium 3.5 - 5.2 mmol/L 4.1 4.3 4.4  Chloride 96 - 106 mmol/L 100 102 103  CO2 20 - 29 mmol/L 21 26 24   Calcium 8.7 - 10.3 mg/dL 8.8 9.0 9.2  Total Protein 6.0 - 8.5 g/dL 6.0 6.3 6.3  Total Bilirubin 0.0 - 1.2 mg/dL 0.4 0.5 0.6  Alkaline Phos 44 - 121 IU/L 89 82 81  AST 0 - 40 IU/L 23 22 28   ALT 0 - 32 IU/L 30 31 38(H)   ASSESSMENT & PLAN Baby Gieger is a 60 y.o. female presenting to the clinic for evaluation for thrombocytopenia. After review of her records, the most likely cause is administration of Cladribine [facility]) for multiple sclerosis in February 2020. The platelet count started to decline shortly after in April 2020. We will further evaluate other etiologies including splenomegaly, liver disease and nutritional anemias.   #Thrombocytopenia likely 2/2 medication:  --Likely secondary to Cladribine [facility]) for multiple sclerosis  --Will rule out other etiologies with labs including CBC, B12, folate, platelet by citrate and save smear. In addition, we will rule out liver disease and splenomegaly with an abdominal US.  --RTC pending unless diagnostic workup reveal further intervention.    No orders of the defined types were placed in this encounter.   All questions were answered. The patient knows to call the clinic with any problems, questions or concerns.  A total of more than 60 minutes were spent on this encounter and over half of that time was spent on counseling and coordination of care as outlined above.    Dede Query, PA-C Department of Hematology/Oncology [place] at [facility] Phone: [phone]  Patient was seen with Dr. Lorenso Courier.   I have read the  above note and personally examined the patient. I agree with the assessment and plan as noted above.  Briefly Mrs. Wattley is a 60 year old female with medical history significant for MS who presents for evaluation of thrombocytopenia. The timeline of her thrombocytopenia correlates well with the start of Cladribine for her MS. This is the most likely etiology, however in the interest of being thorough we will work up her thrombocyopenia with nutritional workup, liver US, and smear review. Prior hepatitis B/C and HIV workup in the last year was negative.    Ledell Peoples, MD Department of Hematology/Oncology [place] at [facility] Phone: [phone] Pager: [phone] Email: [email or web address]

## 2020-08-18 DIAGNOSIS — D696 Thrombocytopenia, unspecified: Secondary | ICD-10-CM | POA: Insufficient documentation

## 2020-08-20 DIAGNOSIS — M5030 Other cervical disc degeneration, unspecified cervical region: Secondary | ICD-10-CM | POA: Diagnosis not present

## 2020-08-20 DIAGNOSIS — E782 Mixed hyperlipidemia: Secondary | ICD-10-CM | POA: Diagnosis not present

## 2020-08-20 DIAGNOSIS — G35 Multiple sclerosis: Secondary | ICD-10-CM | POA: Diagnosis not present

## 2020-08-24 DIAGNOSIS — E782 Mixed hyperlipidemia: Secondary | ICD-10-CM | POA: Diagnosis not present

## 2020-08-24 DIAGNOSIS — J4530 Mild persistent asthma, uncomplicated: Secondary | ICD-10-CM | POA: Diagnosis not present

## 2020-08-24 DIAGNOSIS — E6609 Other obesity due to excess calories: Secondary | ICD-10-CM | POA: Diagnosis not present

## 2020-08-26 ENCOUNTER — Other Ambulatory Visit: Payer: Self-pay

## 2020-08-26 ENCOUNTER — Ambulatory Visit (HOSPITAL_COMMUNITY)
Admission: RE | Admit: 2020-08-26 | Discharge: 2020-08-26 | Disposition: A | Discharge status: Home / Self Care | Payer: Medicare HMO | Source: Ambulatory Visit | Attending: Physician Assistant | Admitting: Physician Assistant

## 2020-08-26 DIAGNOSIS — D696 Thrombocytopenia, unspecified: Secondary | ICD-10-CM | POA: Insufficient documentation

## 2020-08-26 DIAGNOSIS — K760 Fatty (change of) liver, not elsewhere classified: Secondary | ICD-10-CM | POA: Diagnosis not present

## 2020-08-26 DIAGNOSIS — R161 Splenomegaly, not elsewhere classified: Secondary | ICD-10-CM | POA: Diagnosis not present

## 2020-08-26 IMAGING — US US ABDOMEN COMPLETE
1 series · 13 of 25 positions shown · non-contrast
Comparison: None.

CLINICAL DATA: Thrombocytopenia, evaluate for cirrhosis and
splenomegaly.

EXAM:
ABDOMEN ULTRASOUND COMPLETE

[Series 1: us abdomen complete · 13 of 115 slices shown]
[im 1/115]
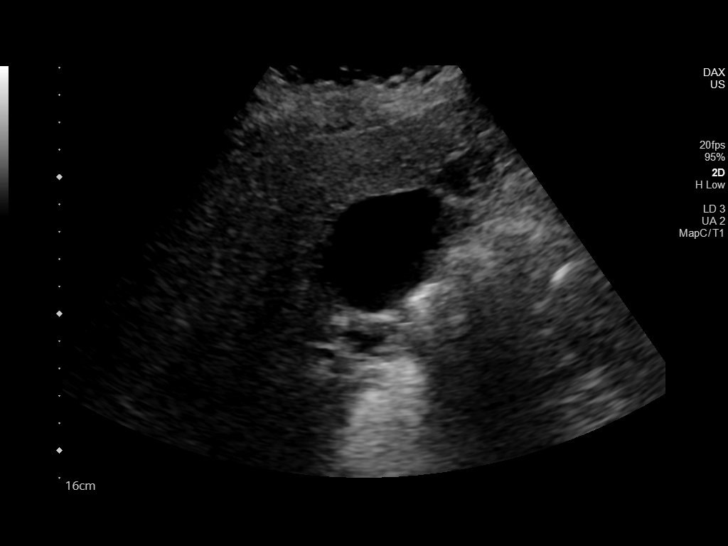
[im 10/115]
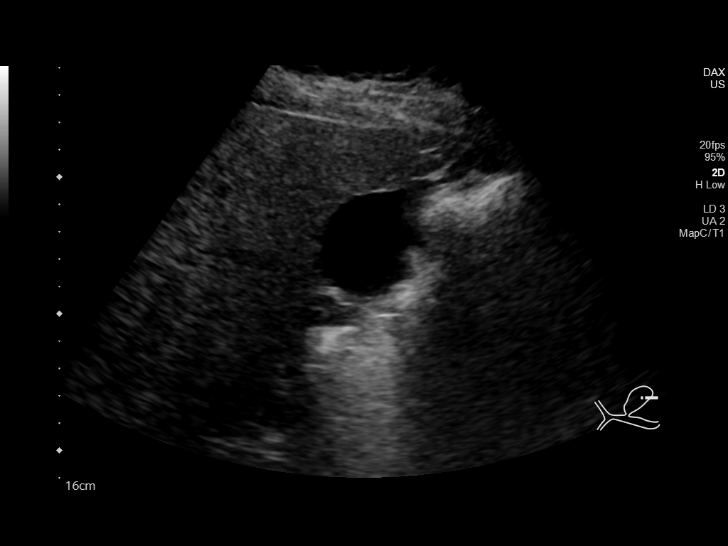
[im 20/115]
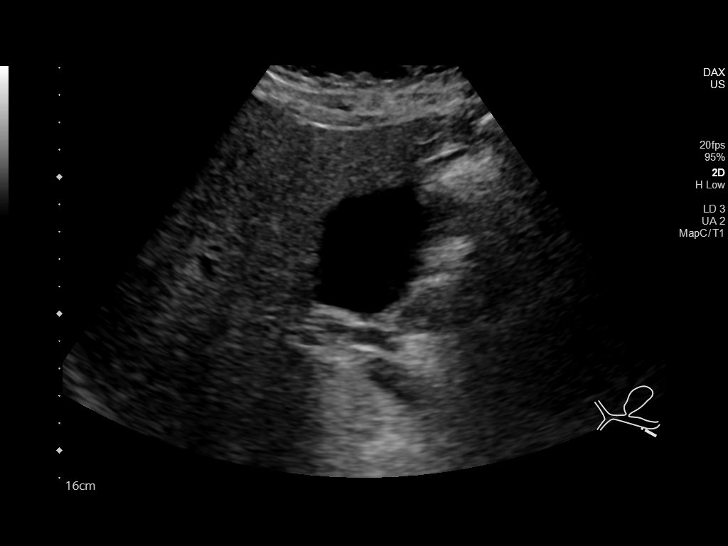
[im 29/115]
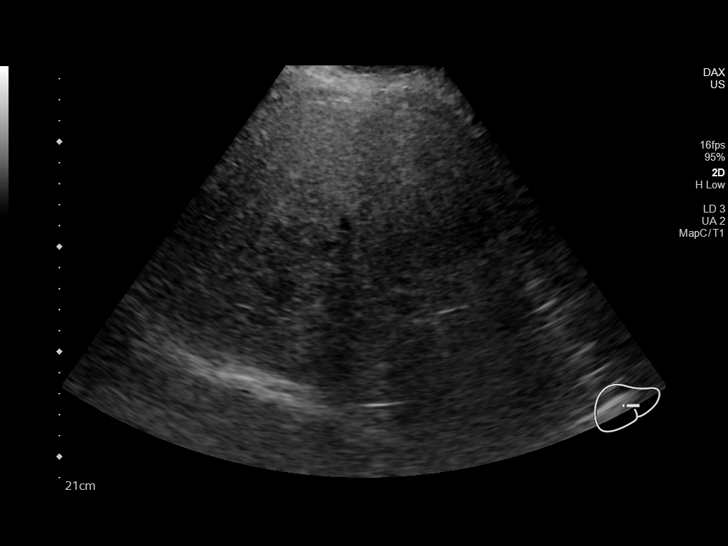
[im 39/115]
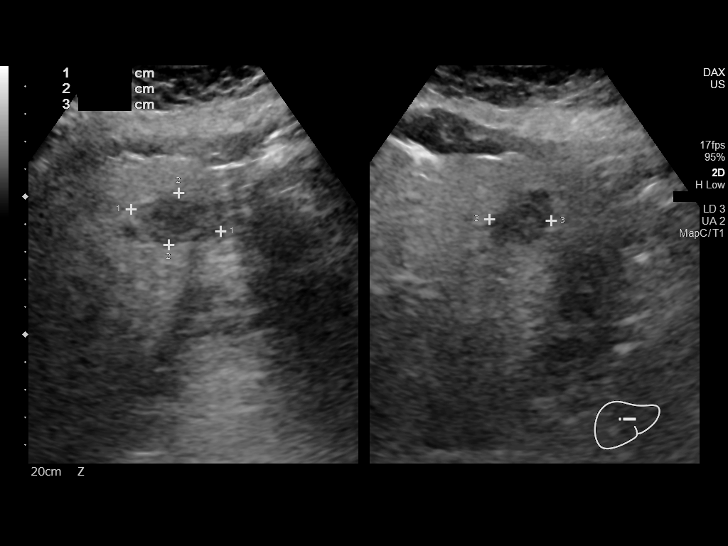
[im 48/115]
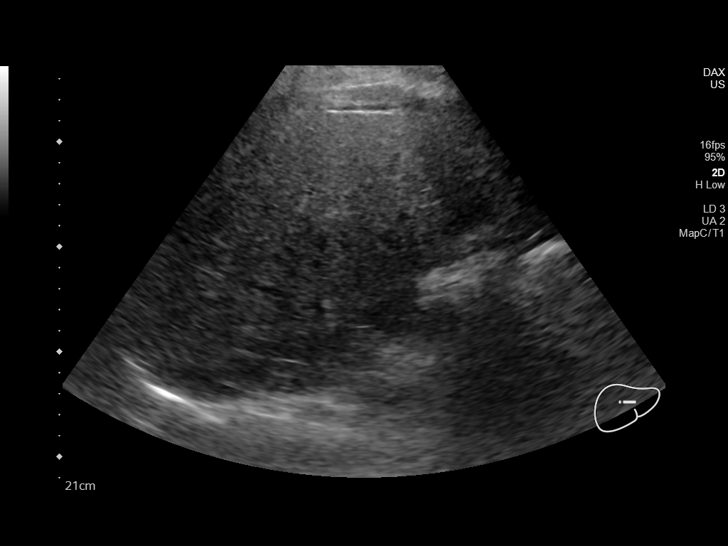
[im 58/115]
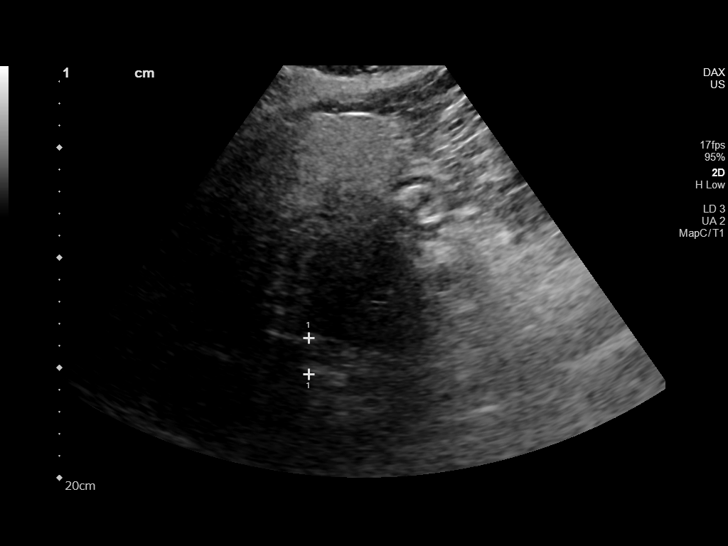
[im 67/115]
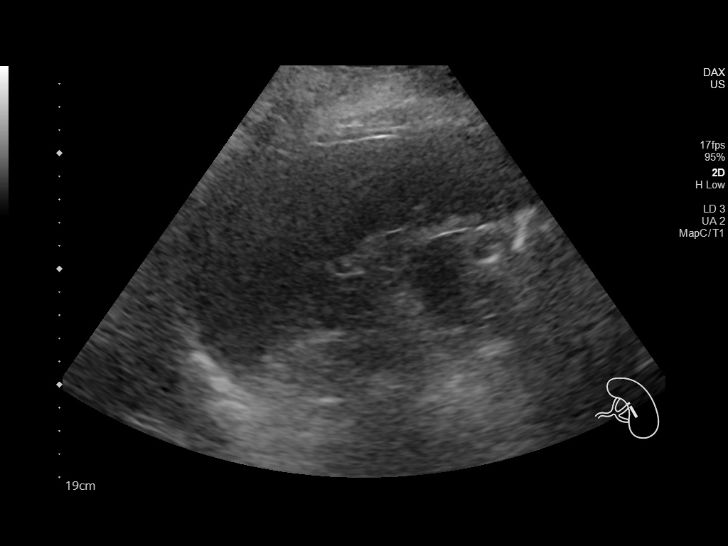
[im 77/115]
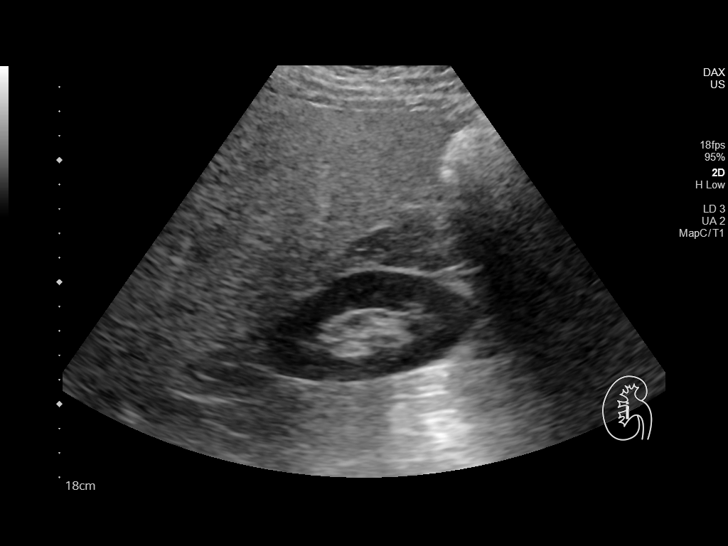
[im 86/115]
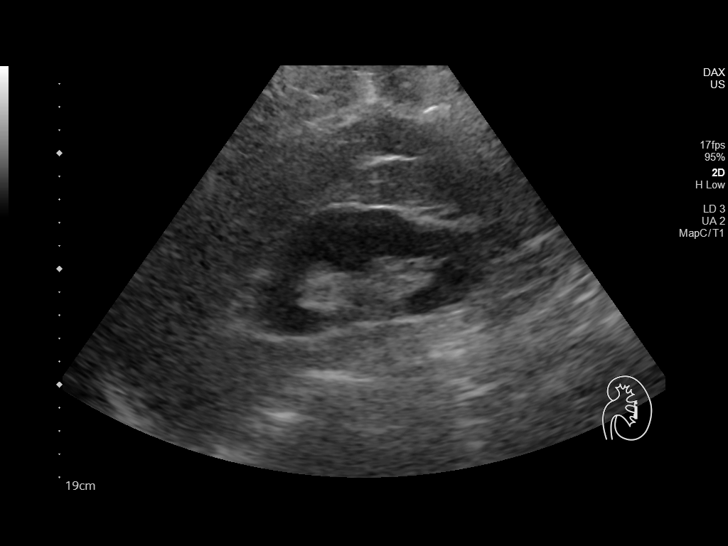
[im 96/115]
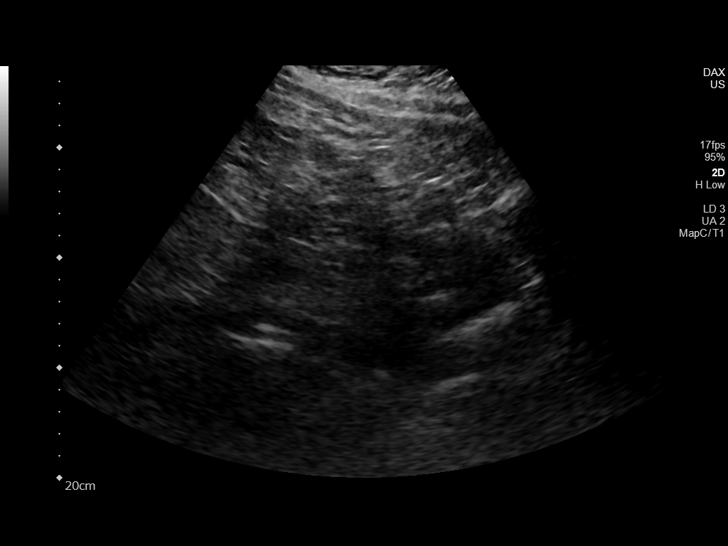
[im 105/115]
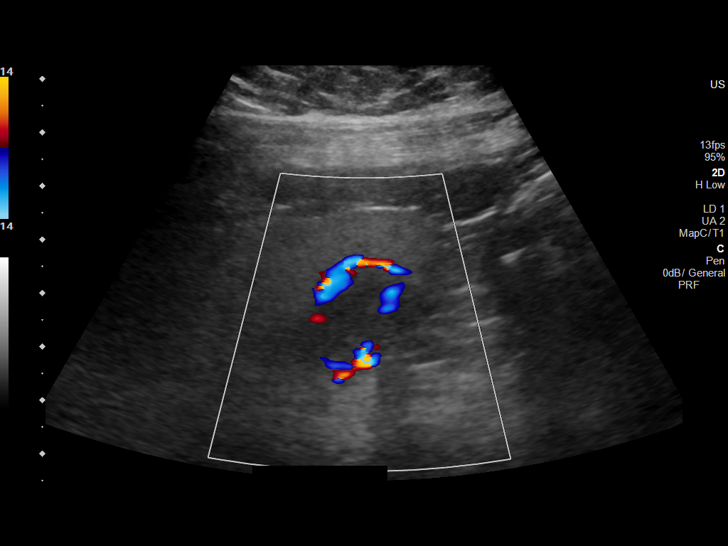
[im 115/115]
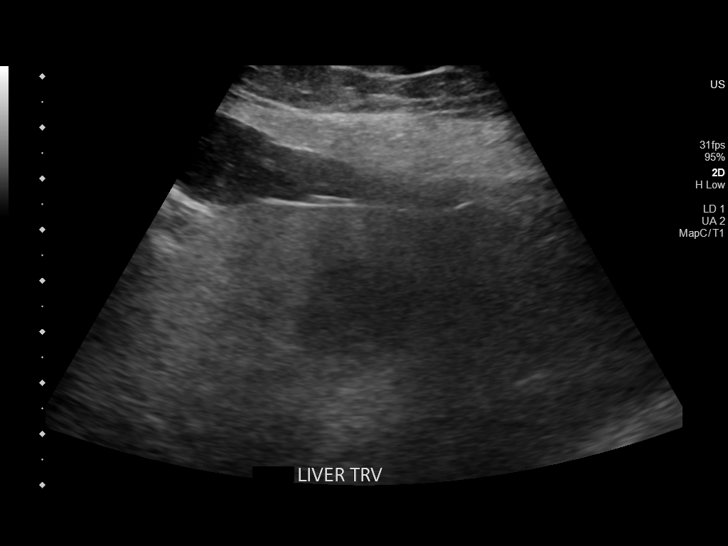

[13 of 25 positions shown; findings below may reference images not displayed]

FINDINGS: Gallbladder: No gallstones or wall thickening visualized. No
sonographic Murphy sign noted by sonographer.

Common bile duct: Diameter: 5 mm

Liver: Diffusely increased parenchymal echogenicity. Hypoechoic
x 2.7 x 2.3 cm area in the left lobe of the liver portal vein is
patent on color Doppler imaging with normal direction of blood flow
towards the liver.

IVC: No abnormality visualized.

Pancreas: Visualized portion unremarkable.

Spleen: Enlarged measuring 14.7 x 15.8 x 5.9 cm with a volume of 712
cc.

Right Kidney: Length: 9.7 cm. Echogenicity within normal limits. No
mass or hydronephrosis visualized.

Left Kidney: Length: 11.3 cm. Echogenicity within normal limits. No
mass or hydronephrosis visualized.

Abdominal aorta: No aneurysm visualized.

Other findings: None.
IMPRESSION: 1. Splenomegaly.
2. Fatty infiltration of the liver with a 3.1 cm hypoechoic area in
the left lobe of the liver, which may represent focal fatty
infiltration but is indeterminate. Further characterization with
liver protocol abdominal MRI with and without contrast is suggested.

These results will be called to the ordering clinician or
representative by the Radiologist Assistant, and communication
documented in the PACS or [REDACTED].

## 2020-08-27 ENCOUNTER — Telehealth: Payer: Self-pay | Admitting: Physician Assistant

## 2020-08-27 DIAGNOSIS — D696 Thrombocytopenia, unspecified: Secondary | ICD-10-CM

## 2020-08-27 DIAGNOSIS — K769 Liver disease, unspecified: Secondary | ICD-10-CM

## 2020-08-27 NOTE — Telephone Encounter (Signed)
I called patient to have reviewed the abdominal ultrasound results from yesterday, 08/26/2020.  Results indicated splenomegaly along with a fatty infiltration of the liver with a 3.1 cm hypoechoic area in the left lobe of the liver.  I explained that thrombocytopenia can be secondary to splenomegaly along with Cladribine administration. Since platelet count is stable at 83K, we can monitor for now.   We recommend obtaining a MRI to further evaluate the hypoechoic area in the left lobe of the liver.   Patient was in agreement with the plan. I will follow up with the patient to review the MRI results.

## 2020-09-04 ENCOUNTER — Encounter: Payer: Self-pay | Admitting: Physician Assistant

## 2020-09-08 ENCOUNTER — Other Ambulatory Visit: Payer: Self-pay | Admitting: Physician Assistant

## 2020-09-08 DIAGNOSIS — N182 Chronic kidney disease, stage 2 (mild): Secondary | ICD-10-CM | POA: Diagnosis not present

## 2020-09-08 DIAGNOSIS — D696 Thrombocytopenia, unspecified: Secondary | ICD-10-CM | POA: Diagnosis not present

## 2020-09-08 DIAGNOSIS — G35 Multiple sclerosis: Secondary | ICD-10-CM | POA: Diagnosis not present

## 2020-09-08 DIAGNOSIS — E782 Mixed hyperlipidemia: Secondary | ICD-10-CM | POA: Diagnosis not present

## 2020-09-08 DIAGNOSIS — J449 Chronic obstructive pulmonary disease, unspecified: Secondary | ICD-10-CM | POA: Diagnosis not present

## 2020-09-08 DIAGNOSIS — J42 Unspecified chronic bronchitis: Secondary | ICD-10-CM | POA: Diagnosis not present

## 2020-09-08 DIAGNOSIS — E1165 Type 2 diabetes mellitus with hyperglycemia: Secondary | ICD-10-CM | POA: Diagnosis not present

## 2020-09-08 DIAGNOSIS — K769 Liver disease, unspecified: Secondary | ICD-10-CM

## 2020-09-11 ENCOUNTER — Other Ambulatory Visit: Payer: Self-pay

## 2020-09-11 ENCOUNTER — Ambulatory Visit
Admission: RE | Admit: 2020-09-11 | Discharge: 2020-09-11 | Disposition: A | Discharge status: Home / Self Care | Payer: Medicare HMO | Source: Ambulatory Visit | Attending: Physician Assistant | Admitting: Physician Assistant

## 2020-09-11 DIAGNOSIS — K7689 Other specified diseases of liver: Secondary | ICD-10-CM | POA: Diagnosis not present

## 2020-09-11 DIAGNOSIS — K769 Liver disease, unspecified: Secondary | ICD-10-CM

## 2020-09-11 IMAGING — MR MR ABDOMEN WO/W CM
10 of 17 series · 27 of 48 positions shown · IV contrast (20ml multihance)
Comparison: Ultrasound exam [DATE]

CLINICAL DATA: Liver lesion on ultrasound.

EXAM:
MRI ABDOMEN WITHOUT AND WITH CONTRAST
TECHNIQUE: Multiplanar multisequence MR imaging of the abdomen was performed
both before and after the administration of intravenous contrast.
CONTRAST:  20mL MULTIHANCE GADOBENATE DIMEGLUMINE 529 MG/ML IV SOLN

[Series 3: T2 fat-sat · axial · 6.0mm · 1.25mm/px · z∈[-149,+96]mm · 2 of 35 slices shown]
[im 1/35]
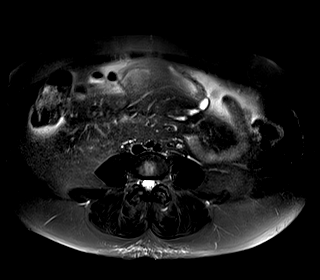
[im 35/35]
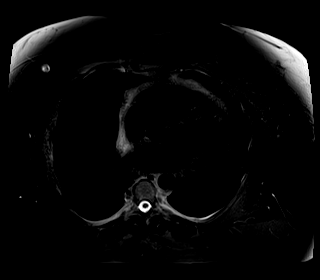

[Series 4: cor haste · coronal · 5.0mm · 0.78mm/px · 2 of 43 slices shown]
[im 1/43]
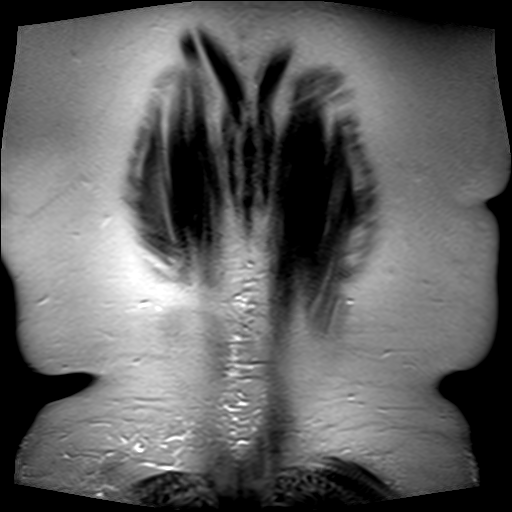
[im 43/43]
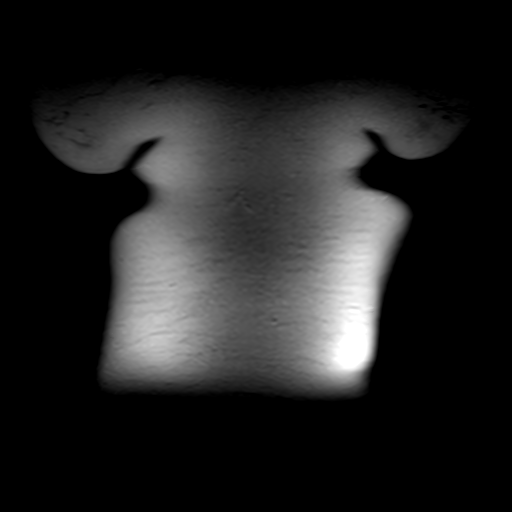

[Series 5: axial haste · axial · 6.0mm · 0.78mm/px · z∈[-163,+81]mm · 2 of 38 slices shown]
[im 1/38]
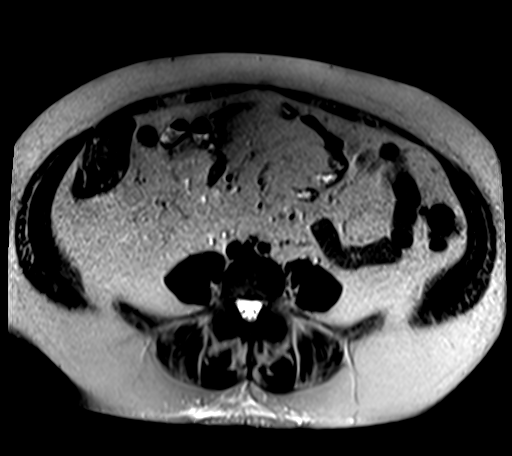
[im 38/38]
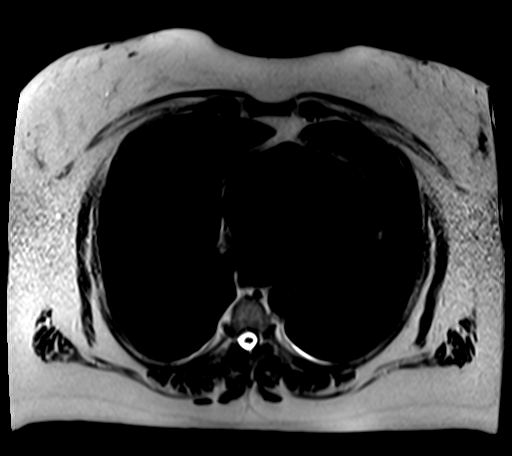

[Series 6: [id number]_500_[id number]_trig · axial · 6.0mm · 2.08mm/px · z∈[-165,+87]mm · 5 of 108 slices shown]
[im 1/108]
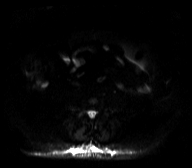
[im 27/108]
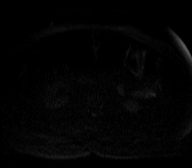
[im 54/108]
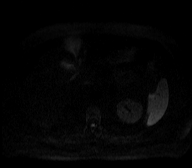
[im 81/108]
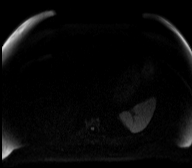
[im 108/108]
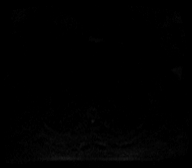

[Series 7: [id number]_trig_adc · axial · 6.0mm · 2.08mm/px · z∈[-165,+87]mm · 2 of 36 slices shown]
[im 1/36]
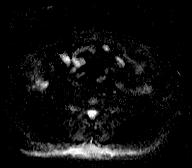
[im 36/36]
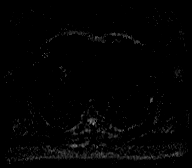

[Series 8: T1 · axial · 6.0mm · 0.78mm/px · z∈[-163,+81]mm · 3 of 76 slices shown]
[im 1/76]
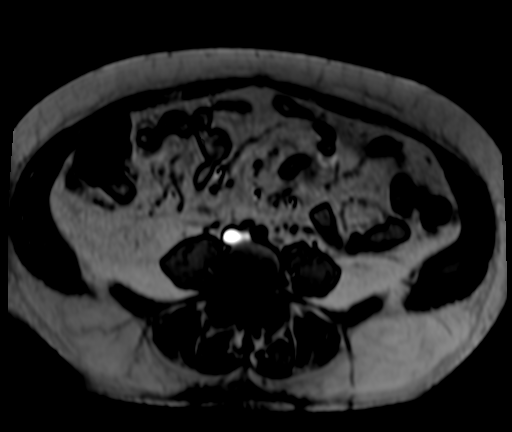
[im 38/76]
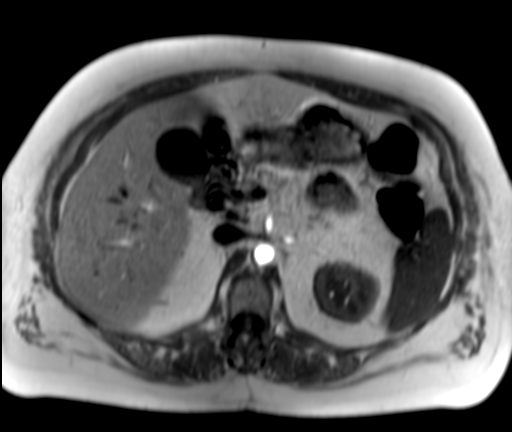
[im 76/76]
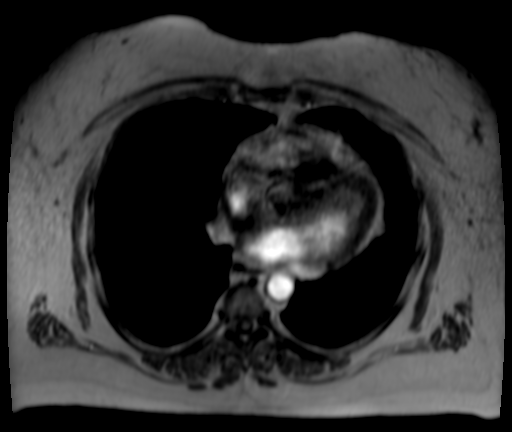

[Series 9: bSSFP · axial · 4.0mm · 0.78mm/px · z∈[-161,+79]mm · 2 of 61 slices shown]
[im 1/61]
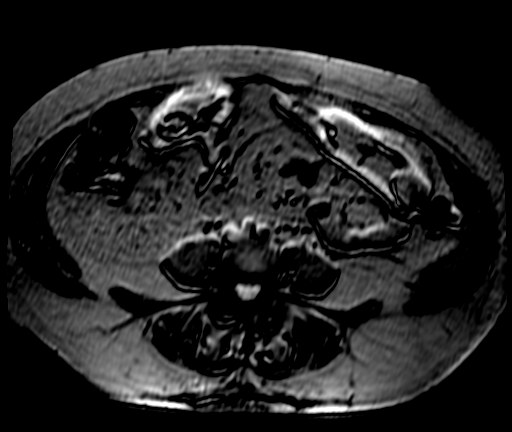
[im 61/61]
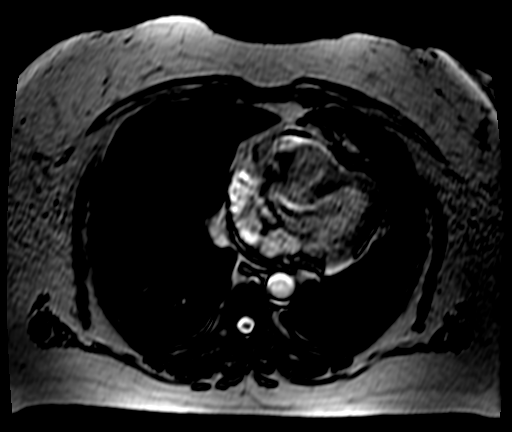

[Series 10: T1 dynamic · axial · non-contrast · 2.5mm · 0.78mm/px · z∈[-179,+79]mm · 3 of 104 slices shown]
[im 1/104]
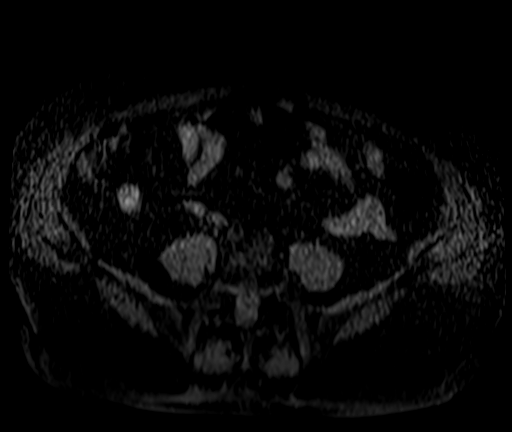
[im 52/104]
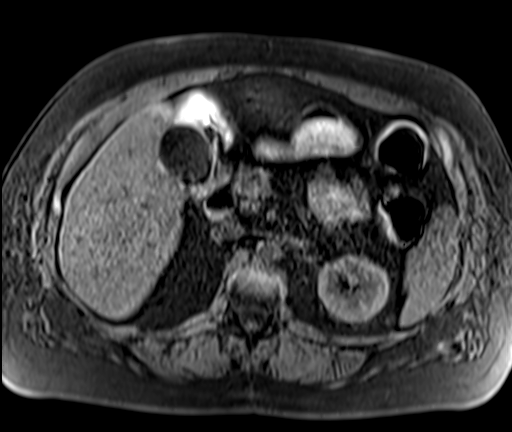
[im 104/104]
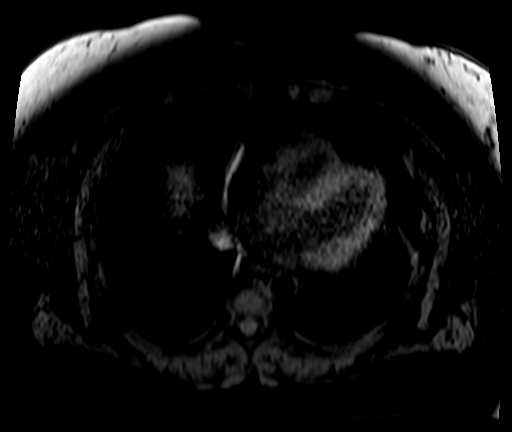

[Series 11: T1 dynamic post-contrast · axial · 2.5mm · 0.78mm/px · z∈[-179,+79]mm · 3 of 104 slices shown (1 of 2)]
[im 1/104]
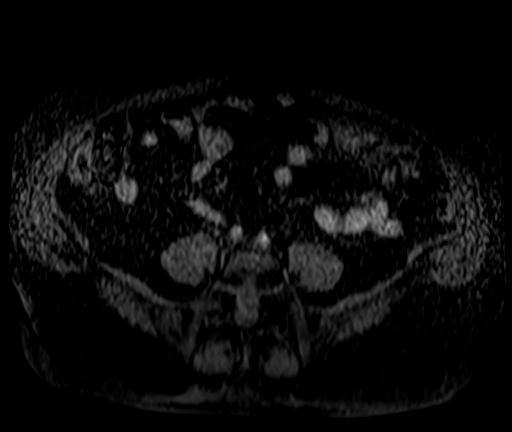
[im 52/104]
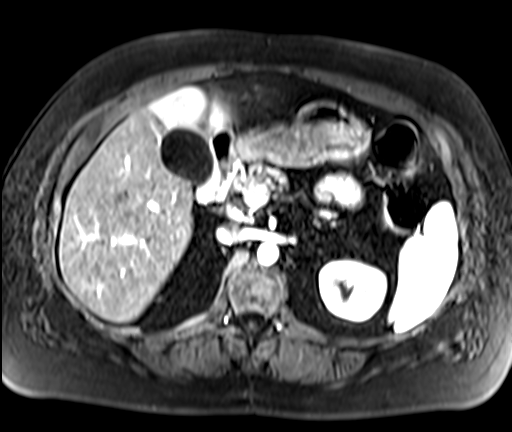
[im 104/104]
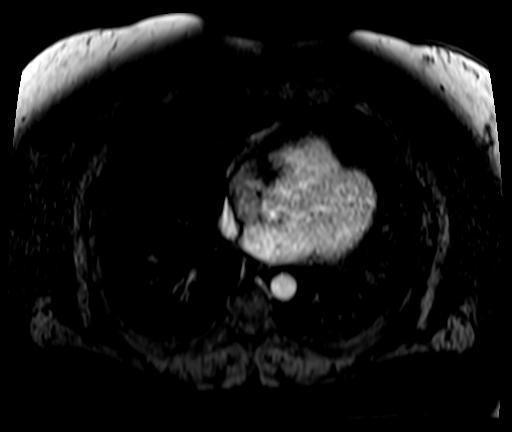

[Series 12: T1 dynamic post-contrast · axial · 2.5mm · 0.78mm/px · z∈[-179,+79]mm · 3 of 104 slices shown (2 of 2)]
[im 1/104]
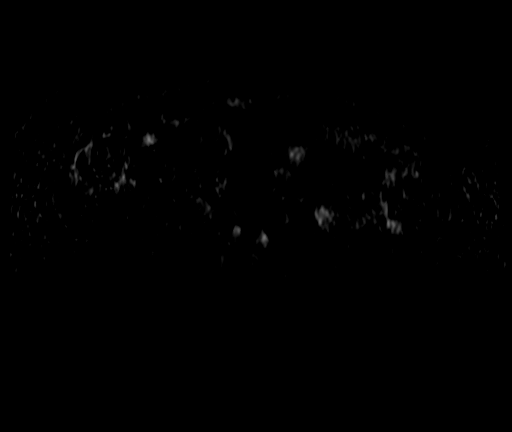
[im 52/104]
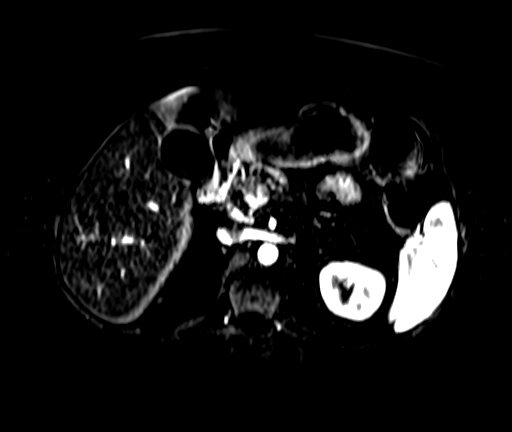
[im 104/104]
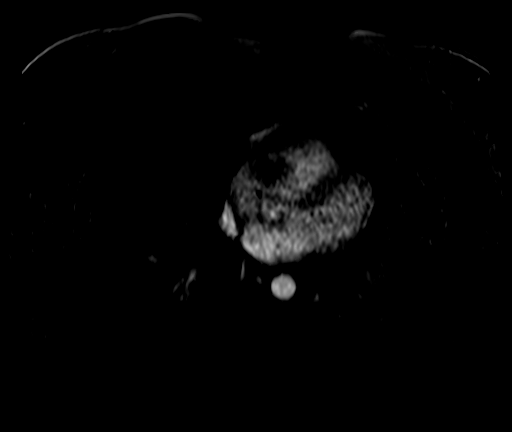

[27 of 48 positions shown; findings below may reference images not displayed]

FINDINGS: Lower chest: Unremarkable.

Hepatobiliary: 2.5 x 2.4 x 2.3 cm subcapsular lesion is identified
in the inferior aspect of the lateral segment left liver (segment
III). This lesion has low signal intensity on T1 imaging and
intermediate to high signal intensity on T2 imaging. No evidence for
restricted diffusion. After IV contrast administration, there is
early irregular peripheral enhancement in the lesion. Also noted on
arterial phase imaging is differentially increased enhancement in
the left liver with early filling of the left hepatic vein
consistent with shunting phenomenon. The left hepatic lesion fills
in diffusely with contrast material by 45 seconds after contrast
administration and remains hyperenhancing relative to liver
parenchyma on delayed imaging.

No other focal abnormality is identified within the hepatic
parenchyma. Diffuse loss of signal intensity in the liver parenchyma
on out of phase T1 imaging is compatible with fatty deposition
although there is some degree of sparing in the left hepatic lobe.
There is no evidence for gallstones, gallbladder wall thickening, or
pericholecystic fluid. No intrahepatic or extrahepatic biliary
dilation.

Pancreas: No focal mass lesion. No dilatation of the main duct. No
intraparenchymal cyst. No peripancreatic edema.

Spleen:  No splenomegaly. No focal mass lesion.

Adrenals/Urinary Tract: No adrenal nodule or mass. Kidneys
unremarkable.

Stomach/Bowel: Stomach is unremarkable. No gastric wall thickening.
No evidence of outlet obstruction. Duodenum is normally positioned
as is the ligament of Treitz. Duodenal diverticulum noted. No small
bowel or colonic dilatation within the visualized abdomen.

Vascular/Lymphatic: No abdominal aortic aneurysm. There is no
gastrohepatic or hepatoduodenal ligament lymphadenopathy. No
retroperitoneal or mesenteric lymphadenopathy.

Other:  No intraperitoneal free fluid.

Musculoskeletal: No focal suspicious marrow enhancement within the
visualized bony anatomy.
IMPRESSION: 2.5 x 2.4 x 2.3 cm subcapsular lesion in the inferior aspect of the
lateral segment left liver (segment III). This lesion cannot be
definitively characterized, but no overtly concerning features by
MRI today and imaging appearance suggests that it is likely an
atypical hemangioma with intralesional shunting. Follow-up MRI in 3
months recommended to ensure stability.

Marked fatty deposition within the liver parenchyma noted with some
sparing of the left liver.

## 2020-09-11 MED ORDER — GADOBENATE DIMEGLUMINE 529 MG/ML IV SOLN
20.0000 mL | Freq: Once | INTRAVENOUS | Status: AC | PRN
  Administered 2020-09-11: 20 mL via INTRAVENOUS

## 2020-09-14 ENCOUNTER — Telehealth: Payer: Self-pay | Admitting: Physician Assistant

## 2020-09-14 DIAGNOSIS — K769 Liver disease, unspecified: Secondary | ICD-10-CM

## 2020-09-14 NOTE — Telephone Encounter (Addendum)
I called Ms. Keayra Graham to review recent MRI results from 09/11/2020.  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  In addition, there is marked fatty deposits within the liver parenchyma with some sparing of the left liver.  The recommendation is a follow-up with gastroenterology as there is strong evidence of NAFLD.  I reached out to patient's gastroenterologist, Dr. Ronnette Juniper, from [facility] to relay my recommendations. Dr. Therisa Doyne will follow up with the patient in the next 1-2 weeks to discuss recommendations.   Plan to repeat MRI in 3 months to follow subcapsular lesion.Patient reports having an hypersensitivity reaction after MRI scan with mild rash and pruritis. Symptoms resolved with PO benadryl. I will send a prescription for prednisone prior to next MRI scan. Patient expressed understanding and satisfaction with the plan provided.

## 2020-09-15 MED ORDER — PREDNISONE 50 MG PO TABS: ORAL_TABLET | ORAL | 0 refills | Status: DC

## 2020-09-24 DIAGNOSIS — N182 Chronic kidney disease, stage 2 (mild): Secondary | ICD-10-CM | POA: Diagnosis not present

## 2020-09-24 DIAGNOSIS — E1165 Type 2 diabetes mellitus with hyperglycemia: Secondary | ICD-10-CM | POA: Diagnosis not present

## 2020-09-30 ENCOUNTER — Telehealth: Payer: Self-pay | Admitting: Physician Assistant

## 2020-09-30 NOTE — Telephone Encounter (Signed)
Scheduled per sch msg. Called and spoke with patient. Confirmed appt  

## 2020-10-20 DIAGNOSIS — E782 Mixed hyperlipidemia: Secondary | ICD-10-CM | POA: Diagnosis not present

## 2020-10-20 DIAGNOSIS — G35 Multiple sclerosis: Secondary | ICD-10-CM | POA: Diagnosis not present

## 2020-10-20 DIAGNOSIS — M5030 Other cervical disc degeneration, unspecified cervical region: Secondary | ICD-10-CM | POA: Diagnosis not present

## 2020-11-03 DIAGNOSIS — G4733 Obstructive sleep apnea (adult) (pediatric): Secondary | ICD-10-CM | POA: Diagnosis not present

## 2020-11-05 DIAGNOSIS — E1165 Type 2 diabetes mellitus with hyperglycemia: Secondary | ICD-10-CM | POA: Diagnosis not present

## 2020-11-05 DIAGNOSIS — E6609 Other obesity due to excess calories: Secondary | ICD-10-CM | POA: Diagnosis not present

## 2020-11-06 ENCOUNTER — Other Ambulatory Visit: Payer: Self-pay | Admitting: Neurology

## 2020-11-26 ENCOUNTER — Ambulatory Visit (HOSPITAL_COMMUNITY)
Admission: RE | Admit: 2020-11-26 | Discharge: 2020-11-26 | Disposition: A | Discharge status: Home / Self Care | Payer: Medicare HMO | Source: Ambulatory Visit | Attending: Physician Assistant | Admitting: Physician Assistant

## 2020-11-26 ENCOUNTER — Other Ambulatory Visit: Payer: Self-pay

## 2020-11-26 DIAGNOSIS — K5730 Diverticulosis of large intestine without perforation or abscess without bleeding: Secondary | ICD-10-CM | POA: Diagnosis not present

## 2020-11-26 DIAGNOSIS — K769 Liver disease, unspecified: Secondary | ICD-10-CM | POA: Diagnosis not present

## 2020-11-26 DIAGNOSIS — K760 Fatty (change of) liver, not elsewhere classified: Secondary | ICD-10-CM | POA: Diagnosis not present

## 2020-11-26 DIAGNOSIS — R161 Splenomegaly, not elsewhere classified: Secondary | ICD-10-CM | POA: Diagnosis not present

## 2020-11-26 IMAGING — MR MR ABDOMEN WO/W CM
18 series · 48 of 48 positions shown · IV contrast (10 GADAVIST)
Comparison: Comparison is made with [DATE] evaluation.

CLINICAL DATA: Lateral segment LEFT hepatic lobe lesion favored to
represent hemangioma based on prior imaging with atypical imaging
features.

EXAM:
MRI ABDOMEN WITHOUT AND WITH CONTRAST
TECHNIQUE: Multiplanar multisequence MR imaging of the abdomen was performed
both before and after the administration of intravenous contrast.
CONTRAST:  10mL GADAVIST GADOBUTROL 1 MMOL/ML IV SOLN

[Series 2: DWI · axial · 6.0mm · 1.55mm/px · z∈[-104,+170]mm · 4 of 78 slices shown (1 of 2)]
[im 1/78]
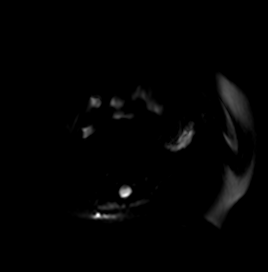
[im 26/78]
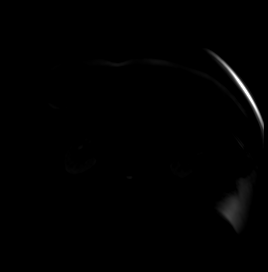
[im 52/78]
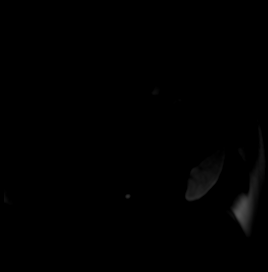
[im 78/78]
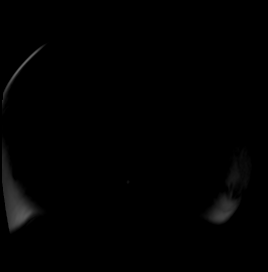

[Series 3: DWI · axial · 6.0mm · 1.55mm/px · z∈[-104,+170]mm · 2 of 39 slices shown (2 of 2)]
[im 1/39]
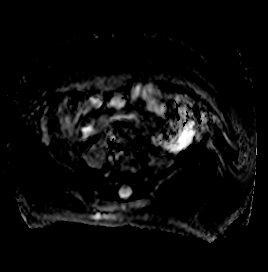
[im 39/39]
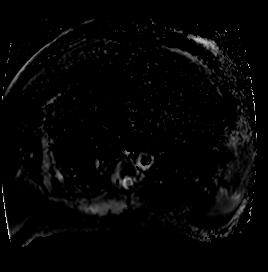

[Series 4: T2 fat-sat · axial · 6.0mm · 1.28mm/px · z∈[-92,+181]mm · 2 of 39 slices shown]
[im 1/39]
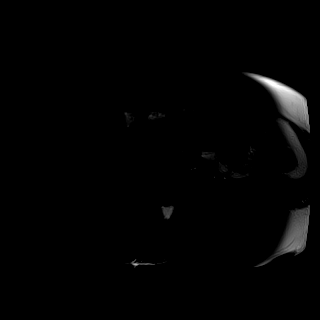
[im 39/39]
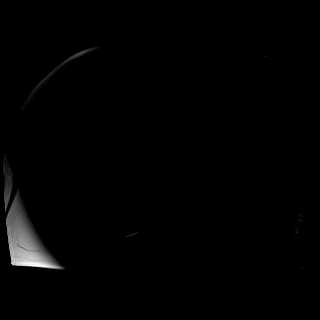

[Series 7: T2 · coronal · 6.0mm · 1.60mm/px · 2 of 44 slices shown (1 of 2)]
[im 1/44]
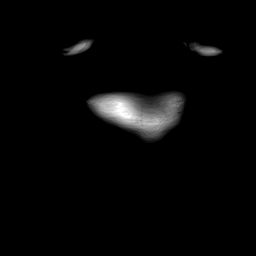
[im 44/44]
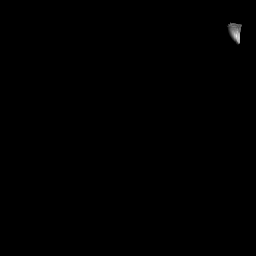

[Series 8: T1 · axial · 3.5mm · 1.28mm/px · z∈[-93,+184]mm · 3 of 80 slices shown (1 of 2)]
[im 1/80]
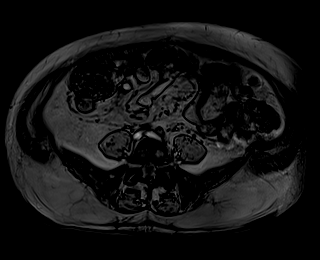
[im 40/80]
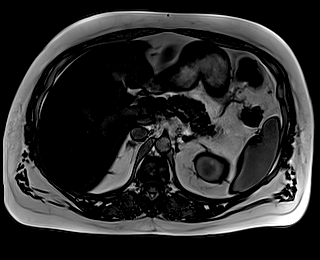
[im 80/80]
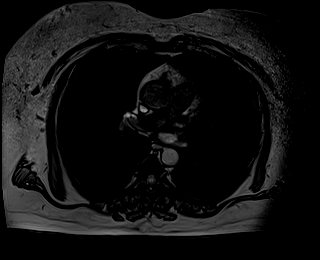

[Series 9: T1 · axial · 3.5mm · 1.28mm/px · z∈[-93,+184]mm · 3 of 80 slices shown (2 of 2)]
[im 1/80]
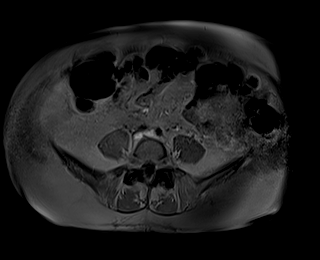
[im 40/80]
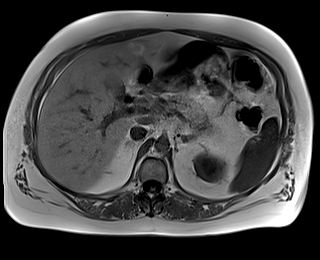
[im 80/80]
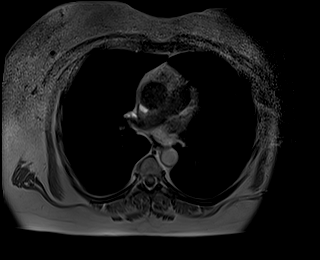

[Series 10: bSSFP · axial · 5.0mm · 0.84mm/px · z∈[-105,+175]mm · 2 of 52 slices shown]
[im 1/52]
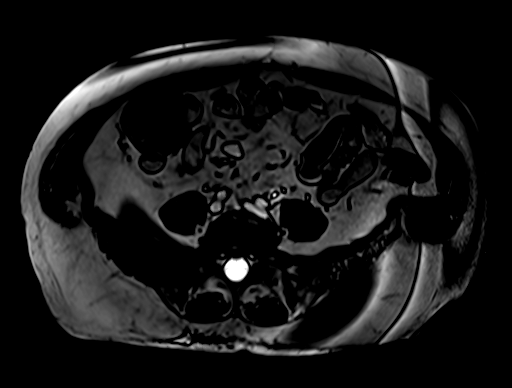
[im 52/52]
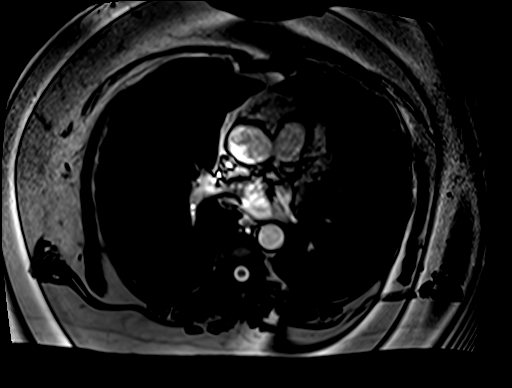

[Series 12: T1 dynamic · axial · 3.0mm · 1.25mm/px · z∈[-90,+171]mm · 3 of 88 slices shown (1 of 10)]
[im 1/88]
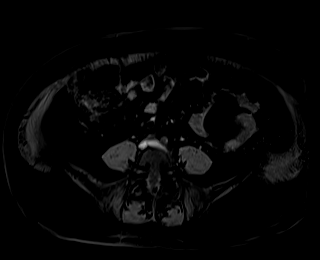
[im 44/88]
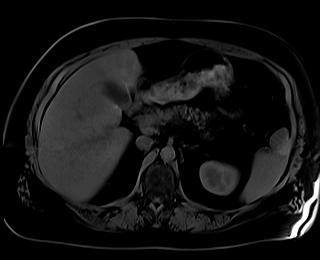
[im 88/88]
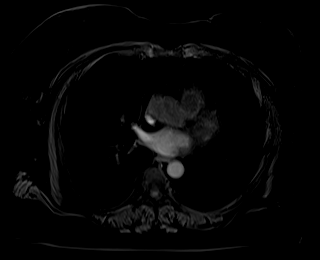

[Series 16: T1 dynamic · axial · 3.0mm · 1.25mm/px · z∈[-90,+171]mm · 3 of 88 slices shown (2 of 10)]
[im 1/88]
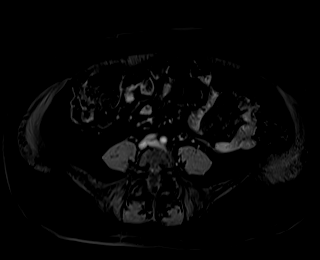
[im 44/88]
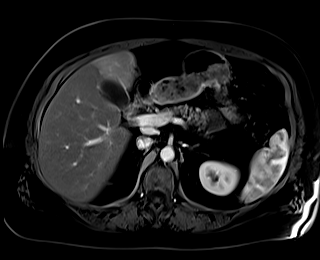
[im 88/88]
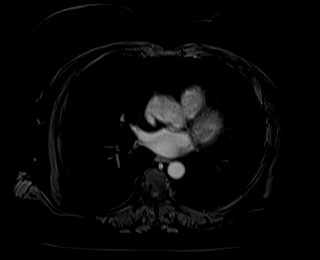

[Series 17: T1 dynamic · axial · 3.0mm · 1.25mm/px · z∈[-90,+171]mm · 3 of 88 slices shown (3 of 10)]
[im 1/88]
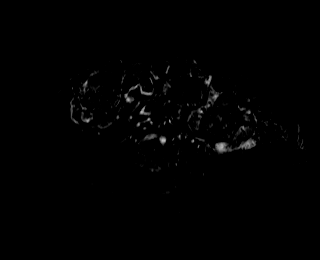
[im 44/88]
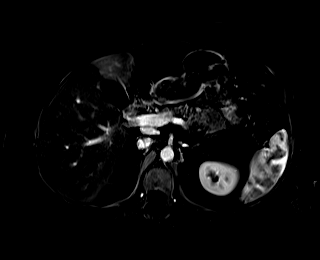
[im 88/88]
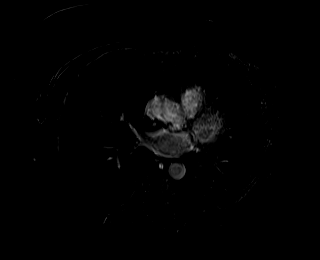

[Series 20: T1 dynamic · axial · 3.0mm · 1.25mm/px · z∈[-90,+171]mm · 3 of 88 slices shown (4 of 10)]
[im 1/88]
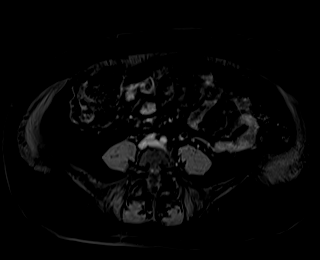
[im 44/88]
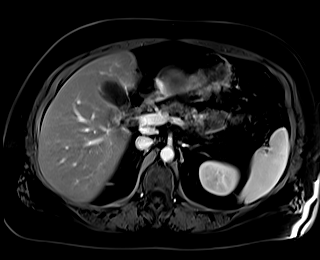
[im 88/88]
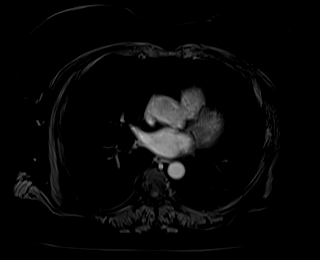

[Series 21: T1 dynamic · axial · 3.0mm · 1.25mm/px · z∈[-90,+171]mm · 3 of 88 slices shown (5 of 10)]
[im 1/88]
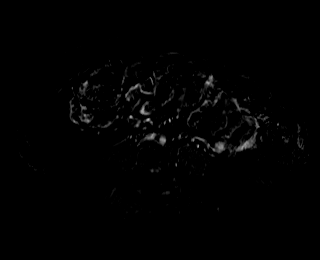
[im 44/88]
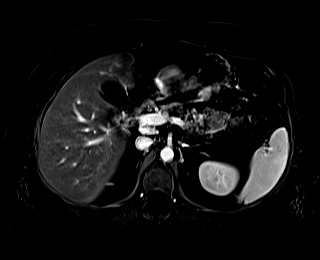
[im 88/88]
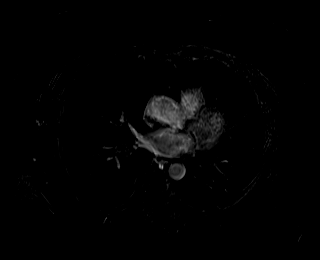

[Series 24: T1 dynamic · axial · 3.0mm · 1.25mm/px · z∈[-90,+171]mm · 3 of 88 slices shown (6 of 10)]
[im 1/88]
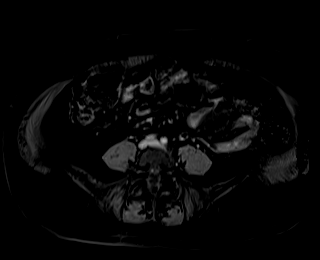
[im 44/88]
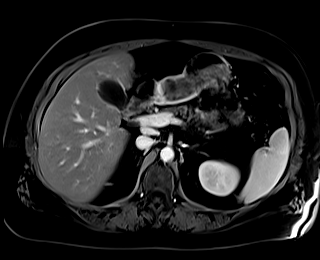
[im 88/88]
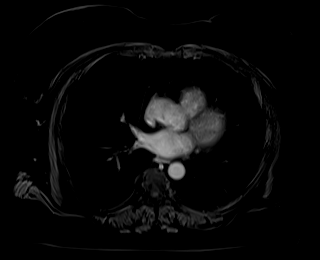

[Series 25: T1 dynamic · axial · 3.0mm · 1.25mm/px · z∈[-90,+171]mm · 3 of 88 slices shown (7 of 10)]
[im 1/88]
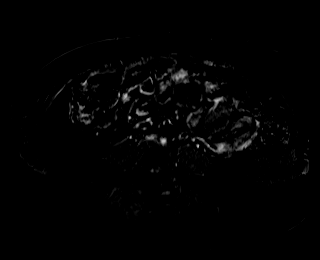
[im 44/88]
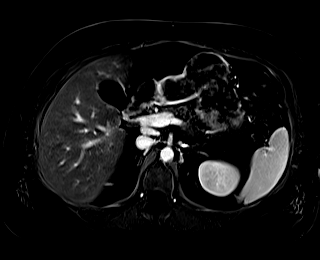
[im 88/88]
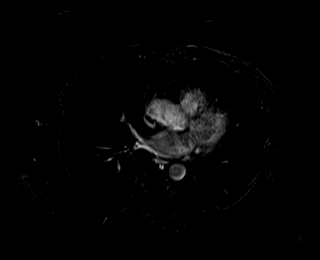

[Series 27: T1 dynamic · coronal · 5.0mm · 1.41mm/px · 2 of 56 slices shown (8 of 10)]
[im 1/56]
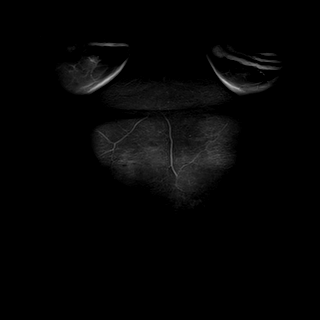
[im 56/56]
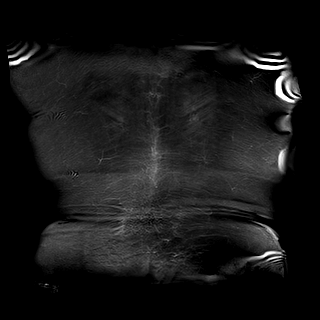

[Series 28: T2 · axial · 6.0mm · 1.56mm/px · 1 of 37 slices shown (2 of 2)]
[im 1/37]
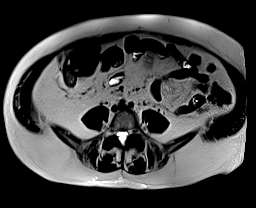

[Series 31: T1 dynamic · axial · 3.0mm · 1.25mm/px · z∈[-90,+171]mm · 3 of 88 slices shown (9 of 10)]
[im 1/88]
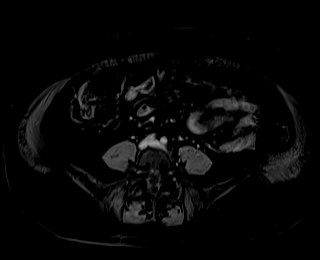
[im 44/88]
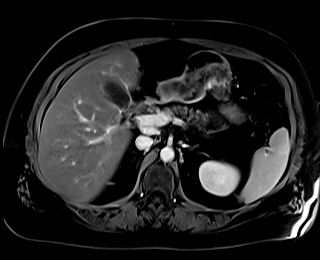
[im 88/88]
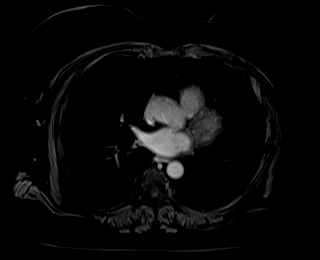

[Series 32: T1 dynamic · axial · 3.0mm · 1.25mm/px · z∈[-90,+171]mm · 3 of 88 slices shown (10 of 10)]
[im 1/88]
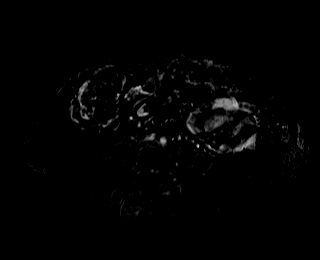
[im 44/88]
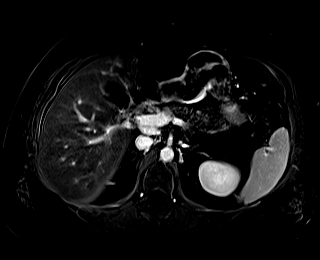
[im 88/88]
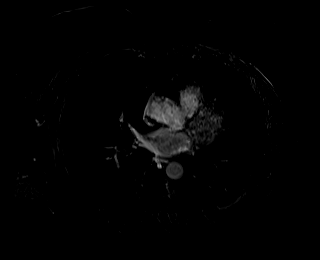

[48 of 48 positions shown; findings below may reference images not displayed]

FINDINGS: Lower chest: Incidental imaging of the lung bases without sign of
effusion or evidence of consolidative change. Limited assessment on
MRI.

Hepatobiliary: Severe hepatic steatosis is similar to the prior
study. No biliary duct dilation. No pericholecystic stranding.

Variable fat deposition is noted in the LEFT hepatic lobe where
there is a lobular LEFT hepatic lobe lesion that displays increased
signal on T2 weighted images and is well-circumscribed with lobular
margins. This measures approximately 2.4 x 1.9 cm and is unchanged
with respect to size compared to the prior study lesion displays
peripheral enhancement that is not typical for hemangioma initially
as there is near complete filling of the lesion on very early phase.
Lesion follows blood pool on all phases. Enhancement of the LEFT
hepatic lobe following variable fat deposition is noted. The
assessment of previous reports dating back to [0M] references a
lesion as large is 3 cm in this location with similar enhancement
characteristics and signs of shunting. Portal vein is patent. There
are no additional suspicious hepatic abnormalities.

Pancreas: Normal intrinsic T1 signal. No ductal dilation or sign of
inflammation. No focal lesion.

Spleen:  Spleen mildly enlarged at 13 cm craniocaudal dimension.

Adrenals/Urinary Tract: Adrenal glands are normal. Symmetric renal
enhancement without hydronephrosis.

Stomach/Bowel: Colonic diverticulosis. No acute bowel abnormality to
the extent evaluated on abdominal MRI. Moderate size periampullary
duodenal diverticulum.

Vascular/Lymphatic: Normal caliber abdominal aorta. Smooth contour
the IVC. There is no gastrohepatic or hepatoduodenal ligament
lymphadenopathy. No retroperitoneal or mesenteric lymphadenopathy.

Other:  No ascites.

Musculoskeletal: No suspicious bone lesions identified.
IMPRESSION: 1. Stable size of a 2.4 x 1.9 cm LEFT hepatic lobe lesion compared
to the prior study dating back to [0M] and displays similar
enhancement characteristics and signs of shunting. This may
represent a hemangioma with associated AV shunting and has been
present reportedly dating back to [0M]. The amount of shunting
present is significant enough to cause perfusion anomaly in the LEFT
hepatic lobe.
2. Severe hepatic steatosis with variable fat deposition following
perfusional differences in the liver.
3. Mild splenomegaly.
4. Colonic diverticulosis.

## 2020-11-26 MED ORDER — GADOBUTROL 1 MMOL/ML IV SOLN
10.0000 mL | Freq: Once | INTRAVENOUS | Status: AC | PRN
  Administered 2020-11-26: 10 mL via INTRAVENOUS

## 2020-12-01 ENCOUNTER — Encounter: Payer: Self-pay | Admitting: Physician Assistant

## 2020-12-01 ENCOUNTER — Other Ambulatory Visit: Payer: Self-pay

## 2020-12-01 ENCOUNTER — Inpatient Hospital Stay: Payer: Medicare HMO | Attending: Physician Assistant | Admitting: Physician Assistant

## 2020-12-01 ENCOUNTER — Other Ambulatory Visit: Payer: Self-pay | Admitting: Physician Assistant

## 2020-12-01 ENCOUNTER — Inpatient Hospital Stay: Payer: Medicare HMO

## 2020-12-01 VITALS — BP 133/93 | HR 96 | Temp 99.4°F | Resp 18 | Ht 64.0 in | Wt 195.8 lb

## 2020-12-01 DIAGNOSIS — D696 Thrombocytopenia, unspecified: Secondary | ICD-10-CM

## 2020-12-01 DIAGNOSIS — F17210 Nicotine dependence, cigarettes, uncomplicated: Secondary | ICD-10-CM | POA: Insufficient documentation

## 2020-12-01 DIAGNOSIS — M8580 Other specified disorders of bone density and structure, unspecified site: Secondary | ICD-10-CM | POA: Diagnosis not present

## 2020-12-01 DIAGNOSIS — Z79899 Other long term (current) drug therapy: Secondary | ICD-10-CM | POA: Insufficient documentation

## 2020-12-01 DIAGNOSIS — R161 Splenomegaly, not elsewhere classified: Secondary | ICD-10-CM | POA: Diagnosis not present

## 2020-12-01 LAB — CBC WITH DIFFERENTIAL (CANCER CENTER ONLY)
Abs Immature Granulocytes: 0.02 10*3/uL (ref 0.00–0.07)
Basophils Absolute: 0 10*3/uL (ref 0.0–0.1)
Basophils Relative: 0 %
Eosinophils Absolute: 0.1 10*3/uL (ref 0.0–0.5)
Eosinophils Relative: 1 %
HCT: 38.2 % (ref 36.0–46.0)
Hemoglobin: 13.4 g/dL (ref 12.0–15.0)
Immature Granulocytes: 0 %
Lymphocytes Relative: 12 %
Lymphs Abs: 0.6 10*3/uL — ABNORMAL LOW (ref 0.7–4.0)
MCH: 33.7 pg (ref 26.0–34.0)
MCHC: 35.1 g/dL (ref 30.0–36.0)
MCV: 96 fL (ref 80.0–100.0)
Monocytes Absolute: 0.3 10*3/uL (ref 0.1–1.0)
Monocytes Relative: 6 %
Neutro Abs: 3.8 10*3/uL (ref 1.7–7.7)
Neutrophils Relative %: 81 %
Platelet Count: 111 10*3/uL — ABNORMAL LOW (ref 150–400)
RBC: 3.98 MIL/uL (ref 3.87–5.11)
RDW: 13.3 % (ref 11.5–15.5)
WBC Count: 4.7 10*3/uL (ref 4.0–10.5)
nRBC: 0 % (ref 0.0–0.2)

## 2020-12-01 LAB — CMP (CANCER CENTER ONLY)
ALT: 40 U/L (ref 0–44)
AST: 23 U/L (ref 15–41)
Albumin: 4.5 g/dL (ref 3.5–5.0)
Alkaline Phosphatase: 66 U/L (ref 38–126)
Anion gap: 10 (ref 5–15)
BUN: 18 mg/dL (ref 8–23)
CO2: 28 mmol/L (ref 22–32)
Calcium: 10.4 mg/dL — ABNORMAL HIGH (ref 8.9–10.3)
Chloride: 102 mmol/L (ref 98–111)
Creatinine: 1.23 mg/dL — ABNORMAL HIGH (ref 0.44–1.00)
GFR, Estimated: 50 mL/min — ABNORMAL LOW (ref >60)
Glucose, Bld: 119 mg/dL — ABNORMAL HIGH (ref 70–99)
Potassium: 4.6 mmol/L (ref 3.5–5.1)
Sodium: 140 mmol/L (ref 135–145)
Total Bilirubin: 0.8 mg/dL (ref 0.3–1.2)
Total Protein: 7.2 g/dL (ref 6.5–8.1)

## 2020-12-01 NOTE — Progress Notes (Signed)
[place] [phone]   [phone] [place]  Patient Care Team: Merrilee Seashore, MD as PCP - General (Internal Medicine)  Hematological/Oncological History 1) Labs since starting Cladribine [facility]) for multiple sclerosis in February 2020:  08/29/2018: WBC 5.2, Hgb 13.1, Plt 135K, Lymphocyte 0.2 01/03/2019: WBC 4.5, Hgb 13.8, Plt 147K, Lymphocyte 0.3 06/05/2019: WBC 5.9, Hgb 13.7, Plt 145K, Lymphocyte 0.7 10/07/2019: WBC 4.0, Hgb 13.1, Plt 102K, Lymphocyte 0.1 02/12/2020: WBC 4.1, Hgb 12.6, Plt 89K, Lymphocyte 0.3 02/27/2020: WBC 4.0, Hgb 12.6, Plt 91K, Lymphocyte 0.3 03/31/2020: WBC 4.7, Hgb 13.0, Plt 91K, Lymphocyte 0.3 05/06/2020: WBC 4.6, Hgb 13.1, Plt 98K, Lymphocyte 0.4 08/12/2020: WBC 4.5, Hgb 12.0, Pkt 78K, Lymphocyte 0.4  2) 08/17/2020: Establish care with Dr. Lorenso Courier and Dede Query PA-C  CHIEF COMPLAINT: Thrombocytopenia   HISTORY OF PRESENTING ILLNESS:  Helen Sandoval 60 y.o. female returns for a follow up for thrombocytopenia. Patient is unaccompanied for this visit.    On exam today , she reports that she has good energy and is able to complete her ADLs on her own. She has a good appetite and denies any significant weight loss. She denies any nausea, vomiting or abdominal pain. She denies any changes to her bowel habits. She denies easy bruising or any signs of bleeding including hematochezia, melena, hematuria or gum bleeding. She continues to have tremors. She denies any recent falls. Patient denies any fevers, chills, shortness of breath, chest pain, cough, neuropathy or skin changes. She has no other complaints. Rest of the 10 point ROS is below.   MEDICAL HISTORY:  Past Medical History:  Diagnosis Date   Ankle fracture    Osteopenia    Scarlet fever    Shingles    Sleep apnea    Spina bifida    Vitamin D deficiency     SURGICAL HISTORY: Past Surgical History:  Procedure Laterality Date    ABDOMINAL HYSTERECTOMY     fibroids and heavy bleeding   BREAST BIOPSY Right    20+ yrs ago   CERVICAL FUSION      SOCIAL HISTORY: Social History   Socioeconomic History   Marital status: Divorced    Spouse name: Not on file   Number of children: Not on file   Years of education: Not on file   Highest education level: Not on file  Occupational History   Not on file  Tobacco Use   Smoking status: Some Days    Pack years: 0.00   Smokeless tobacco: Never   Tobacco comments:    3 cigarrettes per day.   Substance and Sexual Activity   Alcohol use: Yes    Comment: occasional use. a few times a year.    Drug use: No   Sexual activity: Not on file  Other Topics Concern   Not on file  Social History Narrative   Not on file   Social Determinants of Health   Financial Resource Strain: Not on file  Food Insecurity: Not on file  Transportation Needs: Not on file  Physical Activity: Not on file  Stress: Not on file  Social Connections: Not on file  Intimate Partner Violence: Not on file    FAMILY HISTORY: Family History  Problem Relation Age of Onset   Bladder Cancer Mother    Colon cancer Mother    Colon cancer Father        diagnosed less than age of 44.    Bladder Cancer Maternal Grandmother  Leukemia Maternal Grandmother     ALLERGIES:  is allergic to clindamycin, indomethacin, levofloxacin, penicillins, penicillins cross reactors, sulfa antibiotics, levaquin [levofloxacin hemihydrate], lipitor  [atorvastatin calcium], multihance [gadobenate], sulfamethoxazole-trimethoprim, and zocor [simvastatin].  MEDICATIONS:  Current Outpatient Medications  Medication Sig Dispense Refill   alendronate (FOSAMAX) 70 MG tablet Take 70 mg by mouth once a week. Take with a full glass of water on an empty stomach.     APPLE CIDER VINEGAR PO Take 625 mg by mouth daily. 2 caps per day     aspirin EC 81 MG tablet Take 81 mg by mouth every other day.     Biotin 10000 MCG TABS Take by  mouth daily.      buPROPion (WELLBUTRIN SR) 150 MG 12 hr tablet Take 150 mg by mouth 2 (two) times daily.      busPIRone (BUSPAR) 15 MG tablet Take 1 tablet (15 mg total) by mouth 2 (two) times daily. 180 tablet 3   Chlorphen-PE-Acetaminophen (NOREL AD) 4-10-325 MG TABS Take 1 tablet by mouth as needed.     cholecalciferol (VITAMIN D) 1000 UNITS tablet Take 5,000 Units by mouth daily.     Cladribine (MAVENCLAD, 9 TABS, PO) Take by mouth.     co-enzyme Q-10 30 MG capsule Take 100 mg by mouth daily.      cyclobenzaprine (FLEXERIL) 5 MG tablet Take 1 tablet by mouth three times daily as needed 90 tablet 5   DULERA 200-5 MCG/ACT AERO Inhale 2 puffs into the lungs as needed.     estradiol (CLIMARA - DOSED IN MG/24 HR) 0.0375 mg/24hr patch estradiol 0.0375 mg/24 hr weekly transdermal patch     ezetimibe (ZETIA) 10 MG tablet Take 10 mg by mouth daily.      gabapentin (NEURONTIN) 300 MG capsule TAKE 1 CAPSULE BY MOUTH THREE TIMES DAILY 270 capsule 3   ipratropium (ATROVENT) 0.06 % nasal spray Place 1 spray into the nose as needed.     IRON-FOLIC ACID-VIT D40 PO Take by mouth.     Krill Oil 1000 MG CAPS Take 1,000 mg by mouth daily.      metFORMIN (GLUCOPHAGE-XR) 500 MG 24 hr tablet Take 1 tablet by mouth daily.     MILK THISTLE PO Take by mouth.     MYRBETRIQ 50 MG TB24 tablet Take 1 tablet by mouth once daily 90 tablet 3   omeprazole (PRILOSEC) 20 MG capsule Take 20 mg by mouth daily.      OVER THE COUNTER MEDICATION Liver Detox     phentermine 37.5 MG capsule Take 1 capsule (37.5 mg total) by mouth every morning. 30 capsule 5   predniSONE (DELTASONE) 50 MG tablet Take 1 tablet 13 hours, 7 hours and 1 hour (with benadryl) before MRI scan. 3 tablet 0   PSYLLIUM HUSK PO Take by mouth.     traZODone (DESYREL) 100 MG tablet TAKE 1 TABLET BY MOUTH AT BEDTIME 90 tablet 1   TURMERIC PO Take 1 capsule by mouth every morning. +Blackpepper 600mg /5mg      venlafaxine XR (EFFEXOR-XR) 150 MG 24 hr capsule Take 2  capsules by mouth daily.     No current facility-administered medications for this visit.    REVIEW OF SYSTEMS:   Constitutional: ( - ) fevers, ( - )  chills , ( - ) night sweats Eyes: ( - ) blurriness of vision, ( - ) double vision, ( - ) watery eyes Ears, nose, mouth, throat, and face: ( - ) mucositis, ( - )  sore throat Respiratory: ( - ) cough, ( - ) dyspnea, ( - ) wheezes Cardiovascular: ( - ) palpitation, ( - ) chest discomfort, ( - ) lower extremity swelling Gastrointestinal:  ( - ) nausea, ( - ) heartburn, ( - ) change in bowel habits Skin: ( - ) abnormal skin rashes Lymphatics: ( - ) new lymphadenopathy, ( - ) easy bruising Neurological: ( - ) numbness, ( - ) tingling, ( - ) new weaknesses Behavioral/Psych: ( - ) mood change, ( - ) new changes  All other systems were reviewed with the patient and are negative.  PHYSICAL EXAMINATION: ECOG PERFORMANCE STATUS: 1 - Symptomatic but completely ambulatory  Vitals:   12/01/20 1344  BP: (!) 133/93  Pulse: 96  Resp: 18  Temp: 99.4 F (37.4 C)  SpO2: 97%   Filed Weights   12/01/20 1344  Weight: 195 lb 12.8 oz (88.8 kg)    GENERAL: well appearing female in NAD  SKIN: skin color, texture, turgor are normal, or significant lesions. Erythematous skin involving the cheeks.   EYES: conjunctiva are pink and non-injected, sclera clear OROPHARYNX: no exudate, no erythema; lips, buccal mucosa, and tongue normal  NECK: supple, non-tender LYMPH:  no palpable lymphadenopathy in the cervical, axillary or supraclavicular lymph nodes.  LUNGS: clear to auscultation and percussion with normal breathing effort HEART: regular rate & rhythm and no murmurs and no lower extremity edema ABDOMEN: soft, non-tender, non-distended, normal bowel sounds Musculoskeletal: no cyanosis of digits and no clubbing  PSYCH: alert & oriented x 3, fluent speech NEURO: no focal motor/sensory deficits  LABORATORY DATA:  I have reviewed the data as listed CBC  Latest Ref Rng & Units 12/01/2020 08/17/2020 08/12/2020  WBC 4.0 - 10.5 K/uL 4.7 4.8 4.5  Hemoglobin 12.0 - 15.0 g/dL 13.4 12.1 12.0  Hematocrit 36.0 - 46.0 % 38.2 34.2(L) 35.7  Platelets 150 - 400 K/uL 111(L) 83(L) 78(LL)    CMP Latest Ref Rng & Units 12/01/2020 08/12/2020 02/27/2020  Glucose 70 - 99 mg/dL 119(H) 256(H) 206(H)  BUN 8 - 23 mg/dL 18 13 16   Creatinine 0.44 - 1.00 mg/dL 1.23(H) 1.06(H) 0.95  Sodium 135 - 145 mmol/L 140 139 140  Potassium 3.5 - 5.1 mmol/L 4.6 4.1 4.3  Chloride 98 - 111 mmol/L 102 100 102  CO2 22 - 32 mmol/L 28 21 26   Calcium 8.9 - 10.3 mg/dL 10.4(H) 8.8 9.0  Total Protein 6.5 - 8.1 g/dL 7.2 6.0 6.3  Total Bilirubin 0.3 - 1.2 mg/dL 0.8 0.4 0.5  Alkaline Phos 38 - 126 U/L 66 89 82  AST 15 - 41 U/L 23 23 22   ALT 0 - 44 U/L 40 30 31   RADIOGRAPHIC STUDIES: MR Abdomen W Wo Contrast  Result Date: 11/26/2020 CLINICAL DATA:  Lateral segment LEFT hepatic lobe lesion favored to represent hemangioma based on prior imaging with atypical imaging features. EXAM: MRI ABDOMEN WITHOUT AND WITH CONTRAST TECHNIQUE: Multiplanar multisequence MR imaging of the abdomen was performed both before and after the administration of intravenous contrast. CONTRAST:  81mL GADAVIST GADOBUTROL 1 MMOL/ML IV SOLN COMPARISON:  Comparison is made with September 11, 2020 evaluation. FINDINGS: Lower chest: Incidental imaging of the lung bases without sign of effusion or evidence of consolidative change. Limited assessment on MRI. Hepatobiliary: Severe hepatic steatosis is similar to the prior study. No biliary duct dilation. No pericholecystic stranding. Variable fat deposition is noted in the LEFT hepatic lobe where there is a lobular LEFT hepatic lobe lesion that displays  increased signal on T2 weighted images and is well-circumscribed with lobular margins. This measures approximately 2.4 x 1.9 cm and is unchanged with respect to size compared to the prior study lesion displays peripheral enhancement that is  not typical for hemangioma initially as there is near complete filling of the lesion on very early phase. Lesion follows blood pool on all phases. Enhancement of the LEFT hepatic lobe following variable fat deposition is noted. The assessment of previous reports dating back to 2012 references a lesion as large is 3 cm in this location with similar enhancement characteristics and signs of shunting. Portal vein is patent. There are no additional suspicious hepatic abnormalities. Pancreas: Normal intrinsic T1 signal. No ductal dilation or sign of inflammation. No focal lesion. Spleen:  Spleen mildly enlarged at 13 cm craniocaudal dimension. Adrenals/Urinary Tract: Adrenal glands are normal. Symmetric renal enhancement without hydronephrosis. Stomach/Bowel: Colonic diverticulosis. No acute bowel abnormality to the extent evaluated on abdominal MRI. Moderate size periampullary duodenal diverticulum. Vascular/Lymphatic: Normal caliber abdominal aorta. Smooth contour the IVC. There is no gastrohepatic or hepatoduodenal ligament lymphadenopathy. No retroperitoneal or mesenteric lymphadenopathy. Other:  No ascites. Musculoskeletal: No suspicious bone lesions identified. IMPRESSION: 1. Stable size of a 2.4 x 1.9 cm LEFT hepatic lobe lesion compared to the prior study dating back to 2012 and displays similar enhancement characteristics and signs of shunting. 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 The amount of shunting present is significant enough to cause perfusion anomaly in the LEFT hepatic lobe. 2. Severe hepatic steatosis with variable fat deposition following perfusional differences in the liver. 3. Mild splenomegaly. 4. Colonic diverticulosis. Electronically Signed   By: Zetta Bills M.D.   On: 11/26/2020 16:17     ASSESSMENT & PLAN Helen Sandoval is a 60 y.o. female presenting to the clinic for evaluation for  thrombocytopenia.  #Thrombocytopenia likely 2/2 medication and fatty liver with splenomegaly:  --Patient is currently taking Cladribine ([place]) for multiple sclerosis which can cause thrombocytopenia --There is evidence of fatty liver disease suggesting NAFLD and mild splenomegaly that was seen on the past MRIs from 09/11/2020 and 11/26/2020. This can be a contributing factor for thrombocytopenia.  --Labs from 08/17/2020 ruled out B12 and folate deficiencies. There is no evidence of pseudothrombocytopenia. --Labs today show improvement with platelet count of 111K. No further intervention is recommended at this time. Continue to monitor for now.  --RTC in 3 months with repeat labs.   #Left hepatic lobe lesion: --Seen on abdominal US on 08/27/2020 that indicated a 3.1 cm hypoechoic area in tge left lobe of the liver.  --MRI abdomen from 09/11/2020 revealed 2.5 x 2.4 x 2.3 cm subcapsular lesion in the inferior aspect of the lateral segment left liver (segment III). 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 Recommended 3 month follow up.  --MRI abdomen from shows stable size of a 2.4 x 1.9 cm LEFT hepatic lobe lesion compared to the prior study dating back to 2012 and displays similar enhancement characteristics and signs of shunting. 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 --No further workup is required at this time. Continue to monitor.   #Severe hepatic steatosis: --Seen on MRIs from 09/11/2020 and 11/26/2020. --Reviewed findings and there is strong evidence of NAFLD. --Patient is currently under the care of gastroenterologist, Dr. Ronnette Juniper whom I reached out to back in April to follow up with the  patient. --Patient has not had a follow up with Dr. Therisa Doyne so I advised for her to follow up in the near future.   No orders of the defined types were placed in this encounter.   All questions were  answered. The patient knows to call the clinic with any problems, questions or concerns.  I have spent a total of 25 minutes minutes of face-to-face and non-face-to-face time, preparing to see the patient, obtaining and/or reviewing separately obtained history, performing a medically appropriate examination, counseling and educating the patient, ordering tests, documenting clinical information in the electronic health record, and care coordination.    Dede Query, PA-C Department of Hematology/Oncology [place] at [facility] Phone: [phone]

## 2020-12-05 ENCOUNTER — Other Ambulatory Visit: Payer: Self-pay | Admitting: Neurology

## 2020-12-17 ENCOUNTER — Other Ambulatory Visit: Payer: Self-pay | Admitting: Neurology

## 2020-12-20 ENCOUNTER — Other Ambulatory Visit: Payer: Self-pay | Admitting: Neurology

## 2020-12-20 DIAGNOSIS — G35 Multiple sclerosis: Secondary | ICD-10-CM | POA: Diagnosis not present

## 2020-12-20 DIAGNOSIS — M5030 Other cervical disc degeneration, unspecified cervical region: Secondary | ICD-10-CM | POA: Diagnosis not present

## 2020-12-20 DIAGNOSIS — E782 Mixed hyperlipidemia: Secondary | ICD-10-CM | POA: Diagnosis not present

## 2020-12-23 DIAGNOSIS — E1165 Type 2 diabetes mellitus with hyperglycemia: Secondary | ICD-10-CM | POA: Diagnosis not present

## 2021-01-06 DIAGNOSIS — E6609 Other obesity due to excess calories: Secondary | ICD-10-CM | POA: Diagnosis not present

## 2021-01-06 DIAGNOSIS — E782 Mixed hyperlipidemia: Secondary | ICD-10-CM | POA: Diagnosis not present

## 2021-01-06 DIAGNOSIS — R7303 Prediabetes: Secondary | ICD-10-CM | POA: Diagnosis not present

## 2021-01-06 DIAGNOSIS — E1165 Type 2 diabetes mellitus with hyperglycemia: Secondary | ICD-10-CM | POA: Diagnosis not present

## 2021-01-14 ENCOUNTER — Other Ambulatory Visit: Payer: Self-pay | Admitting: Obstetrics & Gynecology

## 2021-01-14 DIAGNOSIS — Z1231 Encounter for screening mammogram for malignant neoplasm of breast: Secondary | ICD-10-CM

## 2021-01-29 ENCOUNTER — Other Ambulatory Visit: Payer: Self-pay | Admitting: Neurology

## 2021-02-03 DIAGNOSIS — G4733 Obstructive sleep apnea (adult) (pediatric): Secondary | ICD-10-CM | POA: Diagnosis not present

## 2021-02-17 ENCOUNTER — Encounter: Payer: Self-pay | Admitting: Neurology

## 2021-02-17 ENCOUNTER — Ambulatory Visit: Payer: Medicare HMO | Admitting: Neurology

## 2021-02-17 VITALS — BP 122/85 | HR 95 | Ht 64.0 in | Wt 191.0 lb

## 2021-02-17 DIAGNOSIS — G35 Multiple sclerosis: Secondary | ICD-10-CM | POA: Diagnosis not present

## 2021-02-17 DIAGNOSIS — F32A Depression, unspecified: Secondary | ICD-10-CM | POA: Diagnosis not present

## 2021-02-17 DIAGNOSIS — Z79899 Other long term (current) drug therapy: Secondary | ICD-10-CM

## 2021-02-17 DIAGNOSIS — G4733 Obstructive sleep apnea (adult) (pediatric): Secondary | ICD-10-CM

## 2021-02-17 DIAGNOSIS — R269 Unspecified abnormalities of gait and mobility: Secondary | ICD-10-CM | POA: Diagnosis not present

## 2021-02-17 DIAGNOSIS — R5383 Other fatigue: Secondary | ICD-10-CM | POA: Diagnosis not present

## 2021-02-17 DIAGNOSIS — F419 Anxiety disorder, unspecified: Secondary | ICD-10-CM | POA: Diagnosis not present

## 2021-02-17 DIAGNOSIS — G35D Multiple sclerosis, unspecified: Secondary | ICD-10-CM

## 2021-02-17 MED ORDER — PHENTERMINE HCL 37.5 MG PO CAPS: 37.5000 mg | ORAL_CAPSULE | Freq: Every morning | ORAL | 5 refills | Status: DC

## 2021-02-17 MED ORDER — BUSPIRONE HCL 15 MG PO TABS: 15.0000 mg | ORAL_TABLET | Freq: Two times a day (BID) | ORAL | 3 refills | Status: DC

## 2021-02-17 MED ORDER — GABAPENTIN 300 MG PO CAPS: 300.0000 mg | ORAL_CAPSULE | Freq: Three times a day (TID) | ORAL | 3 refills | Status: DC

## 2021-02-17 MED ORDER — CYCLOBENZAPRINE HCL 5 MG PO TABS: 5.0000 mg | ORAL_TABLET | Freq: Three times a day (TID) | ORAL | 5 refills | Status: DC | PRN

## 2021-02-17 MED ORDER — TRAZODONE HCL 100 MG PO TABS: 100.0000 mg | ORAL_TABLET | Freq: Every day | ORAL | 3 refills | Status: DC

## 2021-02-17 NOTE — Progress Notes (Signed)
[facility]  PATIENT: Helen Sandoval DOB: 1961/05/01  REFERRING DOCTOR OR PCP:  Otto Herb. ([facility])   Sinclair Ship (new PCP at [facility]) SOURCE: patient and records from [facility]  _________________________________   HISTORICAL  CHIEF COMPLAINT:  Chief Complaint  Patient presents with   Follow-up    Rm 1, alone. Here for 6 month MS f/u, on [place]. Pt reports having issues with cpap, keep getting notified of air leakage. Pt has noticed hair loss, falling out in lumps, since last month. Pt c/o of fatigue.     HISTORY OF PRESENT ILLNESS:  Helen Sandoval is a 60 yo woman who was diagnosed with relapsing remitting MS in 2015.     Update 02/17/2021: She has completed the second year of [place] in April 2021.   Labs 08/17/2020 showed lymphocyte count = 0.5 and count was 0.6 was 12/01/20    LFTs were fine.   Creatinine mildly high  at 123  After the first [facility], she did well but lymphocyte count was low at 0.1.  Therefore, she did Valtrex for few months.  Lymphocyte counts December 2022 was 0.4.   She now reports tremors and fatigue are worse.     Her balance is a little worse.   Tremors come and goes   Platelets were low and she saw hematology.  Her last platelet count was better at 111.    Tremors are a little worse.  Last visit we checked and TSH was fine though T3U was mildly reduced   Her mom has had tremors since her 80's.   TSH had been mildly elevated earlier 2021.   She is taking B12 an Vit D.     She has osteoporosis on recent Bone density.     Her headaches are better.   They are located in the back of the head when present.     She is on BiPAP 15/10 and has excellent compliance on the d/l though AHI was mildly high at 7.1.    She has lost 35 pounds this year after being diagnosed  with NIDDM type 2 .  She is now on metformin.  She has had some hair loss.     Gait and strength are fine.   Sensation is doing the same with mild leg  dysesthesia. .  She just takes gabapentin bid now for the leg pain.  Vision is unchanged.   Bladder function is good.      She is noting a little less fatigue with weight loss but not much different now.    Her 62 yo mother (has had colons urgency and bladder tumors recently) lives with her and she is doing all her chores.  She is sleeping worse but gets 6 hours most nights.  She has an occasioanl day she sleep   Colonoscopy showed polyps ad she will need another one early next year.       MS History:    In 2008, she had headaches and poor balance and was told the MRI had some white matter foci at that time.    She had spinal stenosis and needed surgery in 2014.  The surgeon felt her gait and other issues were decreased out of proportion to the extent of spine disease and referred her to Neurology.   Besides gait issues, she also had double vision, blurry vision, cognitive difficulty and incontinence in early 2015. She saw Dr. Pamalee Leyden in [place]. An MRI of the brain, CSF and  visual evoked potentials were consistent with multiple sclerosis.    Initially, she was placed on Tecfidera. Due to progression with more neurologic symptoms, she was switched to Tysabri.   Her last MRI was done 10/2014 but we do not have the images.    Imaging: MRI brain 05/14/2020 shows T2/FLAIR hyperintense foci in the hemispheres, pons and left basal ganglia.  Most of the foci are nonspecific.  A few are periventricular and radially oriented.  The focus in the left basal ganglia could also represent ischemic sequela.    None of the foci appear to be acute and they do not enhance.    There are no new lesions compared with 05/12/2018 MRI.  MRI of the cervical spine shows 1 or 2 small subtle foci within the spinal cord adjacent to C4-C5 and C6.  These have been seen on previous MRIs.  Though nonspecific, this is consistent with a chronic demyelinating plaque.  Minimal chronic compressive myelopathic signal cannot be ruled out.   There are no new lesions and no enhancing lesions.   Prior C4-C6 ACDF.  There is no nerve root compression or spinal stenosis at these levels.   Mild spinal stenosis and mild foraminal narrowing at C3-C4 and C6-C7 that does not lead to any nerve root compression.  Degenerative changes are stable compared to the 2020 MRI.  REVIEW OF SYSTEMS: Constitutional: No fevers, chills, sweats, or change in appetite.   She notes fatigue Eyes: Reports visual changes and double vision.  No eye pain Ear, nose and throat: No hearing loss, ear pain, nasal congestion, sore throat Cardiovascular: No chest pain, palpitations Respiratory:  No shortness of breath at rest or with exertion.   She has snoring and coughing but no wheezes GastrointestinaI: No nausea, vomiting, abdominal pain.  She notes diarrhea and some fecal incontinence Genitourinary:  No dysuria, urinary retention or frequency. She has had some incontinence Musculoskeletal:  No neck pain, back pain Integumentary: No rash, pruritus, skin lesions Neurological: as above Psychiatric: Some depression at this time.  Some anxiety Endocrine: No palpitations, diaphoresis, change in appetite, change in weigh or increased thirst Hematologic/Lymphatic:  No anemia, purpura, petechiae. Allergic/Immunologic: No itchy/runny eyes, nasal congestion, recent allergic reactions, rashes  ALLERGIES: Allergies  Allergen Reactions   Clindamycin Anaphylaxis   Indomethacin Hives   Levofloxacin Hives   Penicillins Anaphylaxis   Penicillins Cross Reactors Anaphylaxis   Sulfa Antibiotics Swelling    Blister (steven john syndrome)   Levaquin [Levofloxacin Hemihydrate] Hives   Lipitor  [Atorvastatin Calcium]    Multihance [Gadobenate] Hives    Pt having hives/ rash on neck and chest    Sulfamethoxazole-Trimethoprim    Zocor [Simvastatin] Other (See Comments)    Other reaction(s): MUSCLE PAIN Muscle aches    HOME MEDICATIONS:  Current Outpatient Medications:     alendronate (FOSAMAX) 70 MG tablet, Take 70 mg by mouth once a week. Take with a full glass of water on an empty stomach., Disp: , Rfl:    APPLE CIDER VINEGAR PO, Take 625 mg by mouth daily. 2 caps per day, Disp: , Rfl:    aspirin EC 81 MG tablet, Take 81 mg by mouth every other day., Disp: , Rfl:    Biotin 10000 MCG TABS, Take by mouth daily. , Disp: , Rfl:    buPROPion (WELLBUTRIN SR) 150 MG 12 hr tablet, Take 150 mg by mouth 2 (two) times daily. , Disp: , Rfl:    cholecalciferol (VITAMIN D) 1000 UNITS tablet, Take 5,000 Units  by mouth daily., Disp: , Rfl:    Cladribine (MAVENCLAD, 9 TABS, PO), Take by mouth., Disp: , Rfl:    estradiol (CLIMARA - DOSED IN MG/24 HR) 0.0375 mg/24hr patch, estradiol 0.0375 mg/24 hr weekly transdermal patch, Disp: , Rfl:    ezetimibe (ZETIA) 10 MG tablet, Take 10 mg by mouth daily. , Disp: , Rfl:    IRON-FOLIC ACID-VIT K93 PO, Take by mouth., Disp: , Rfl:    Krill Oil 1000 MG CAPS, Take 1,000 mg by mouth daily. , Disp: , Rfl:    metFORMIN (GLUCOPHAGE-XR) 500 MG 24 hr tablet, Take 1 tablet by mouth daily., Disp: , Rfl:    MILK THISTLE PO, Take by mouth., Disp: , Rfl:    MYRBETRIQ 50 MG TB24 tablet, Take 1 tablet by mouth once daily, Disp: 90 tablet, Rfl: 3   omeprazole (PRILOSEC) 20 MG capsule, Take 20 mg by mouth daily. , Disp: , Rfl:    OVER THE COUNTER MEDICATION, Liver Detox, Disp: , Rfl:    predniSONE (DELTASONE) 50 MG tablet, Take 1 tablet 13 hours, 7 hours and 1 hour (with benadryl) before MRI scan., Disp: 3 tablet, Rfl: 0   PSYLLIUM HUSK PO, Take by mouth., Disp: , Rfl:    TURMERIC PO, Take 1 capsule by mouth every morning. +Blackpepper 600mg /5mg , Disp: , Rfl:    venlafaxine XR (EFFEXOR-XR) 150 MG 24 hr capsule, Take 2 capsules by mouth daily., Disp: , Rfl:    busPIRone (BUSPAR) 15 MG tablet, Take 1 tablet (15 mg total) by mouth 2 (two) times daily., Disp: 180 tablet, Rfl: 3   cyclobenzaprine (FLEXERIL) 5 MG tablet, Take 1 tablet (5 mg total) by mouth 3  (three) times daily as needed., Disp: 90 tablet, Rfl: 5   gabapentin (NEURONTIN) 300 MG capsule, Take 1 capsule (300 mg total) by mouth 3 (three) times daily., Disp: 270 capsule, Rfl: 3   phentermine 37.5 MG capsule, Take 1 capsule (37.5 mg total) by mouth every morning., Disp: 30 capsule, Rfl: 5   traZODone (DESYREL) 100 MG tablet, Take 1 tablet (100 mg total) by mouth at bedtime., Disp: 90 tablet, Rfl: 3  PAST MEDICAL HISTORY: Past Medical History:  Diagnosis Date   Ankle fracture    Diabetes ([place])    Fatty liver    Osteopenia    Scarlet fever    Shingles    Sleep apnea    Spina bifida    Splenomegaly    Thrombocytopenia ([facility])    Vitamin D deficiency     PAST SURGICAL HISTORY: Past Surgical History:  Procedure Laterality Date   ABDOMINAL HYSTERECTOMY     fibroids and heavy bleeding   BREAST BIOPSY Right    20+ yrs ago   CERVICAL FUSION      FAMILY HISTORY: Family History  Problem Relation Age of Onset   Bladder Cancer Mother    Colon cancer Mother    Colon cancer Father        diagnosed less than age of 53.    Bladder Cancer Maternal Grandmother    Leukemia Maternal Grandmother     SOCIAL HISTORY:  Social History   Socioeconomic History   Marital status: Divorced    Spouse name: Not on file   Number of children: Not on file   Years of education: Not on file   Highest education level: Not on file  Occupational History   Not on file  Tobacco Use   Smoking status: Some Days   Smokeless tobacco: Never  Tobacco comments:    3 cigarrettes per day.   Substance and Sexual Activity   Alcohol use: Yes    Comment: occasional use. a few times a year.    Drug use: No   Sexual activity: Not on file  Other Topics Concern   Not on file  Social History Narrative   Not on file   Social Determinants of Health   Financial Resource Strain: Not on file  Food Insecurity: Not on file  Transportation Needs: Not on file  Physical Activity: Not on file  Stress: Not  on file  Social Connections: Not on file  Intimate Partner Violence: Not on file     PHYSICAL EXAM  Vitals:   02/17/21 1300  BP: 122/85  Pulse: 95  Weight: 191 lb (86.6 kg)  Height: 5\' 4"  (1.626 m)    Body mass index is 32.79 kg/m.   General: The patient is well-developed and well-nourished and in no acute distress  Neck: The neck is supple with good ROM and nontender.      Neurologic Exam  Mental status: The patient is alert and oriented x 3 at the time of the examination.  During the interaction today, she has apparent normal recent and remote memory, with an apparently normal attention span and concentration ability.   Speech is normal.  Cranial nerves: Extraocular movements appear normal.  Facial strength was normal.  Trapezius strength is normal.  No dysarthria is noted.  Hearing appears normal and symmetric.  Motor: She has a low amplitude fast tremor in the hands.  Muscle bulk is normal.   Tone is normal. Strength is  5 / 5 in all 4 extremities.   Sensory: She reports slightly reduced sensation to vibration in the right leg    Coordination:  finger-nose-finger is performed well.  Heel-to-shin is performed well.  Gait and station: Station is normal.  She has a mildly reduced stride..  The tandem gait is wide..  Romberg sign is negative.  Reflexes: Deep tendon reflexes are symmetric and 2+ arms and 3+ knees with spread.   There is no ankle clonus..    ASSESSMENT AND PLAN    1. Multiple sclerosis ([place])   2. High risk medication use   3. Anxiety and depression   4. Other fatigue   5. OSA (obstructive sleep apnea)   6. Gait abnormality      1.  She completed her second course of [place] April 2021.Marland Kitchen   MRI of the brain and cervical spine December 2021 was stable.  At the last appointment March 2022, the lymphocyte count was still mildly low.  We will recheck this as well as liver function test today.  Check MRI of the brain and cervical spine to determine if  there is any subclinical progression.  We will do this about 1 year after her last MRI (December 2022) 2.   Continue BiPAP but reduce the pressure to 13/8.  She uses it nightly.  Advised to try to lose some weight.  She will 3.   Continue Phentermine 37.5 mg in the morning for sleepiness, weight loss and fatigue.  Renew other medications. Return in 6 months or sooner if there are new or worsening neurologic symptoms.   Eeva Schlosser A. Felecia Shelling, MD, PhD 7/34/1937, 9:02 PM Certified in Neurology, Clinical Neurophysiology, Sleep Medicine, Pain Medicine and Neuroimaging . [facility] [address] [place], [place] [address] [phone]

## 2021-02-18 LAB — HEPATIC FUNCTION PANEL
ALT: 27 IU/L (ref 0–32)
AST: 18 IU/L (ref 0–40)
Albumin: 4.8 g/dL (ref 3.8–4.8)
Alkaline Phosphatase: 80 IU/L (ref 44–121)
Bilirubin Total: 0.3 mg/dL (ref 0.0–1.2)
Bilirubin, Direct: 0.11 mg/dL (ref 0.00–0.40)
Total Protein: 6.4 g/dL (ref 6.0–8.5)

## 2021-02-18 LAB — THYROID PANEL WITH TSH
Free Thyroxine Index: 1.3 (ref 1.2–4.9)
T3 Uptake Ratio: 22 % — ABNORMAL LOW (ref 24–39)
T4, Total: 5.9 ug/dL (ref 4.5–12.0)
TSH: 5.08 u[IU]/mL — ABNORMAL HIGH (ref 0.450–4.500)

## 2021-02-18 LAB — CBC WITH DIFFERENTIAL/PLATELET
Basophils Absolute: 0 10*3/uL (ref 0.0–0.2)
Basos: 0 %
EOS (ABSOLUTE): 0.1 10*3/uL (ref 0.0–0.4)
Eos: 1 %
Hematocrit: 37.1 % (ref 34.0–46.6)
Hemoglobin: 12.6 g/dL (ref 11.1–15.9)
Immature Grans (Abs): 0 10*3/uL (ref 0.0–0.1)
Immature Granulocytes: 1 %
Lymphocytes Absolute: 0.6 10*3/uL — ABNORMAL LOW (ref 0.7–3.1)
Lymphs: 11 %
MCH: 33.4 pg — ABNORMAL HIGH (ref 26.6–33.0)
MCHC: 34 g/dL (ref 31.5–35.7)
MCV: 98 fL — ABNORMAL HIGH (ref 79–97)
Monocytes Absolute: 0.3 10*3/uL (ref 0.1–0.9)
Monocytes: 6 %
Neutrophils Absolute: 4.2 10*3/uL (ref 1.4–7.0)
Neutrophils: 81 %
Platelets: 113 10*3/uL — ABNORMAL LOW (ref 150–450)
RBC: 3.77 x10E6/uL (ref 3.77–5.28)
RDW: 13.1 % (ref 11.7–15.4)
WBC: 5.2 10*3/uL (ref 3.4–10.8)

## 2021-02-22 ENCOUNTER — Ambulatory Visit
Admission: RE | Admit: 2021-02-22 | Discharge: 2021-02-22 | Disposition: A | Discharge status: Home / Self Care | Payer: Medicare HMO | Source: Ambulatory Visit

## 2021-02-22 ENCOUNTER — Other Ambulatory Visit: Payer: Self-pay

## 2021-02-22 DIAGNOSIS — Z1231 Encounter for screening mammogram for malignant neoplasm of breast: Secondary | ICD-10-CM

## 2021-02-22 IMAGING — MG MM DIGITAL SCREENING BILAT W/ TOMO AND CAD
8 series · 8 of 24 positions shown · non-contrast
Comparison: Previous exam(s).

CLINICAL DATA: Screening.

EXAM:
DIGITAL SCREENING BILATERAL MAMMOGRAM WITH TOMOSYNTHESIS AND CAD
TECHNIQUE: Bilateral screening digital craniocaudal and mediolateral oblique
mammograms were obtained. Bilateral screening digital breast
tomosynthesis was performed. The images were evaluated with
computer-aided detection.

[L MLO synth-2D]
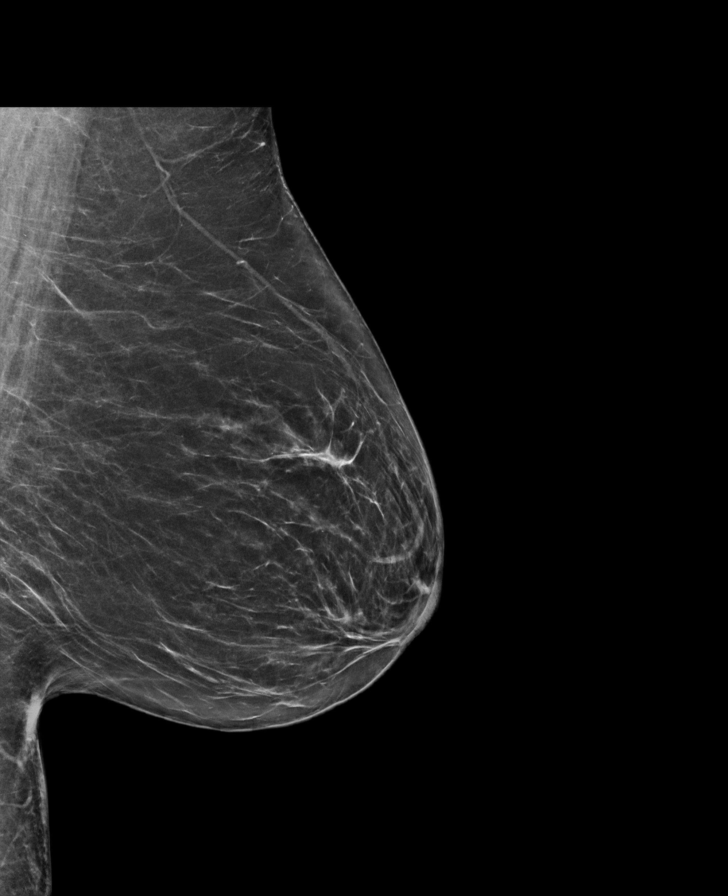

[L CC synth-2D]
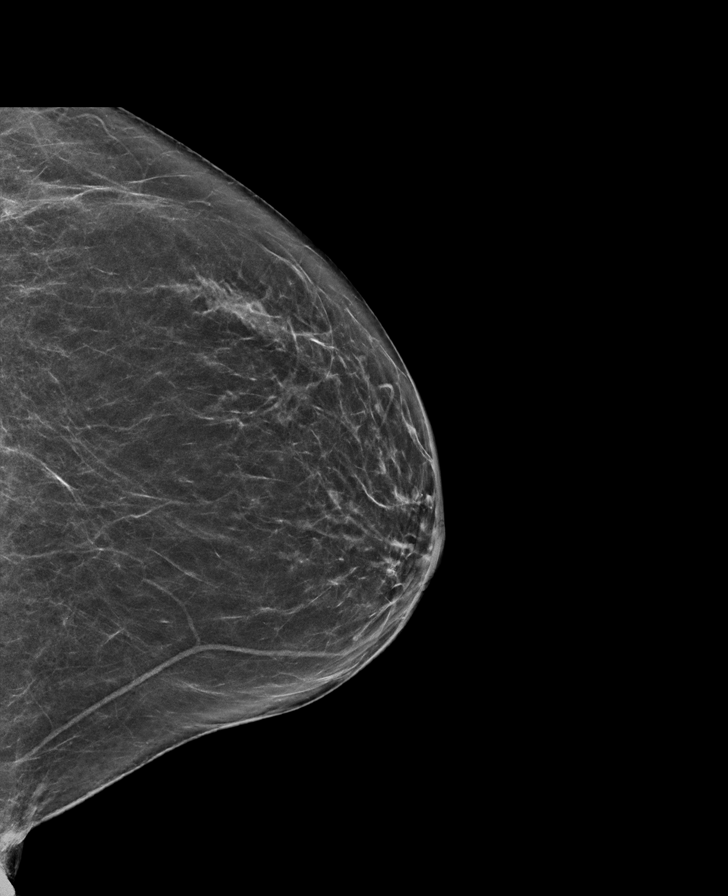

[R CC synth-2D]
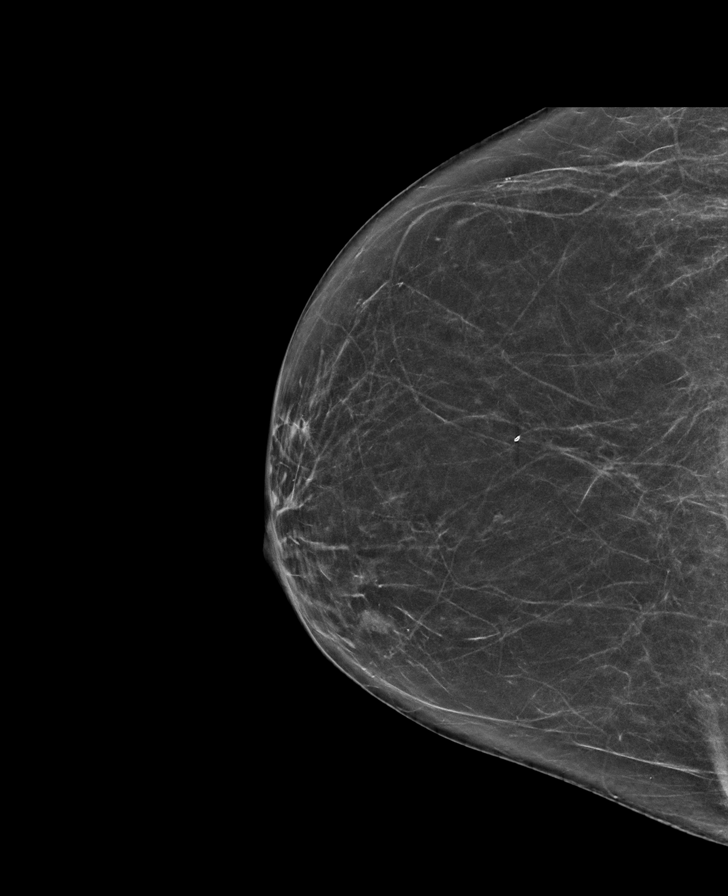

[R MLO synth-2D]
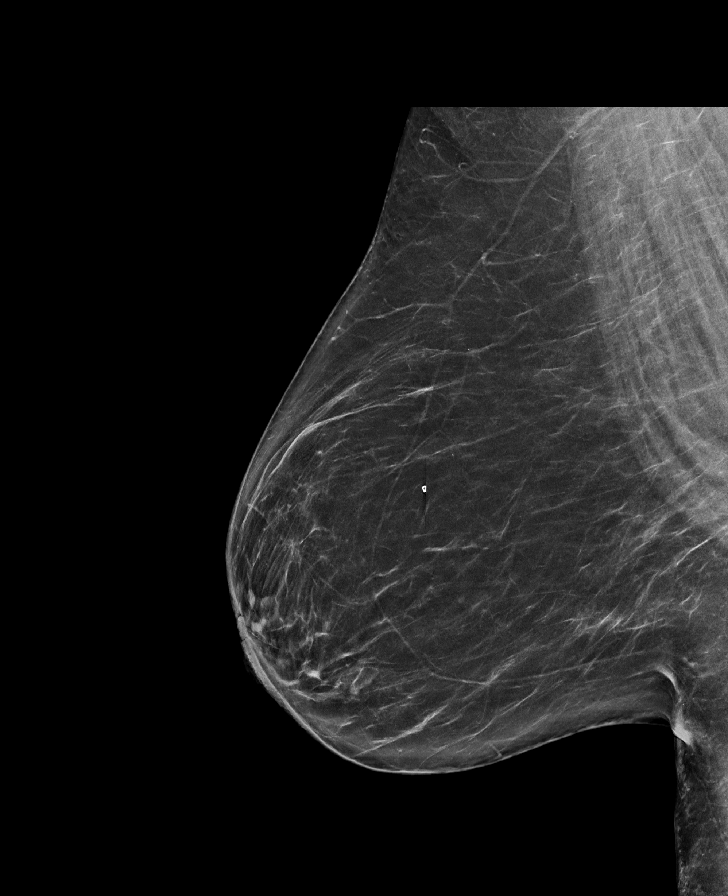

[L CC tomo · tomo slice 32/63.0]
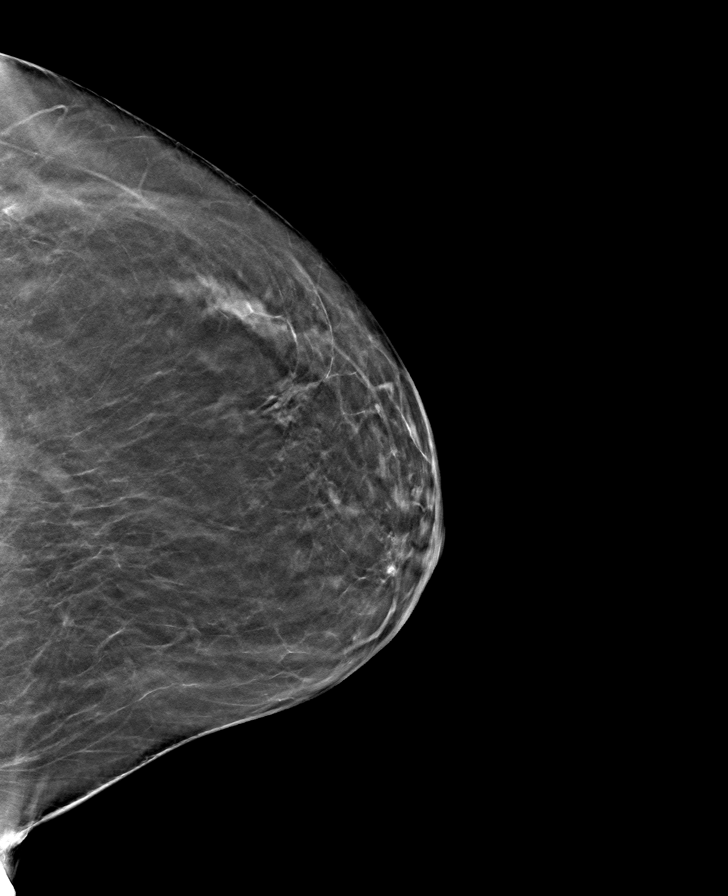

[R MLO tomo · tomo slice 41/81.0]
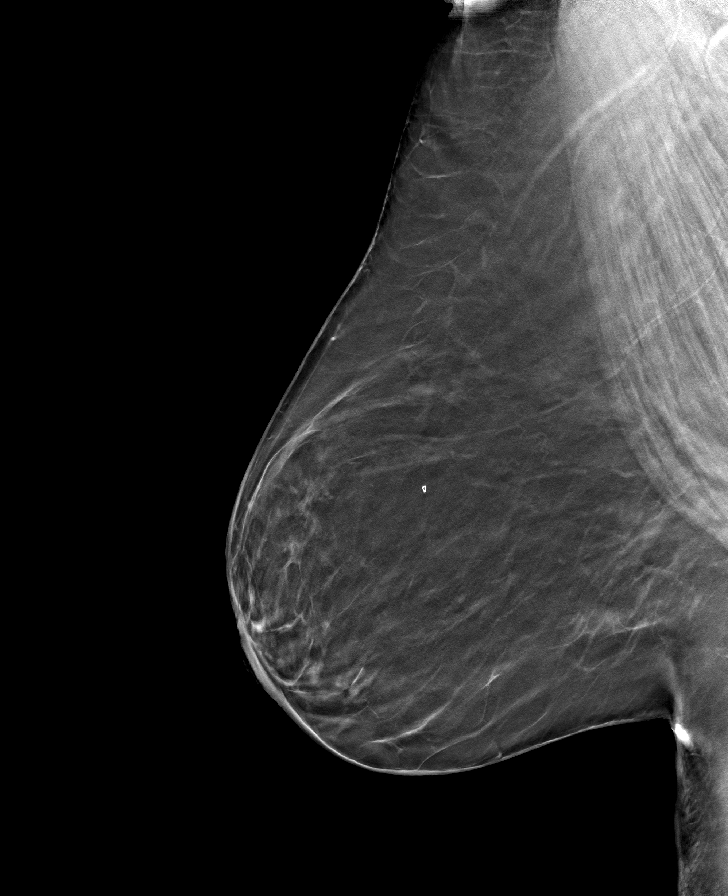

[L MLO tomo · tomo slice 36/71.0]
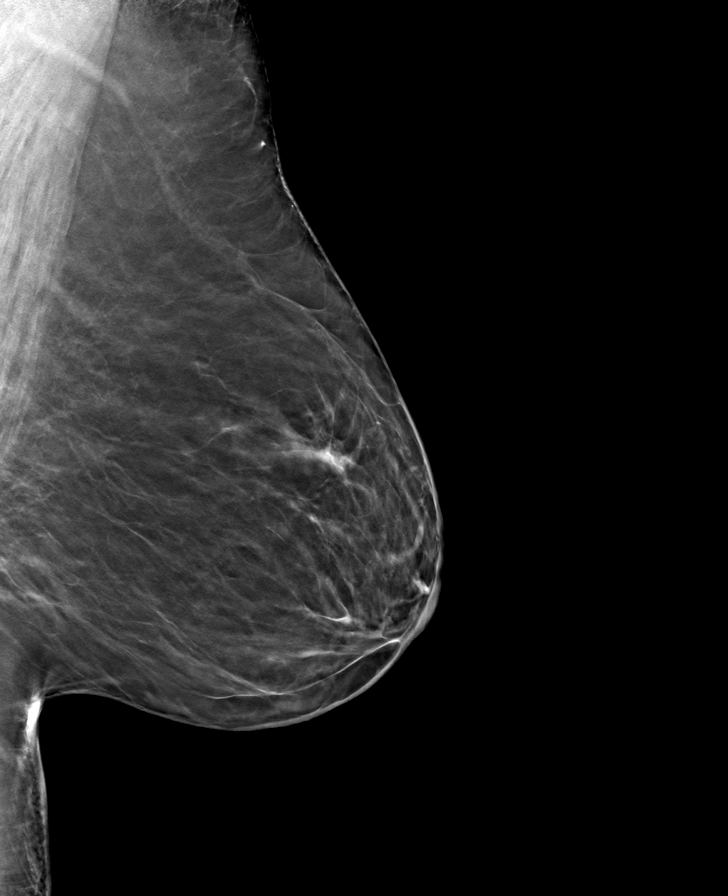

[R CC tomo · tomo slice 31/60.0]
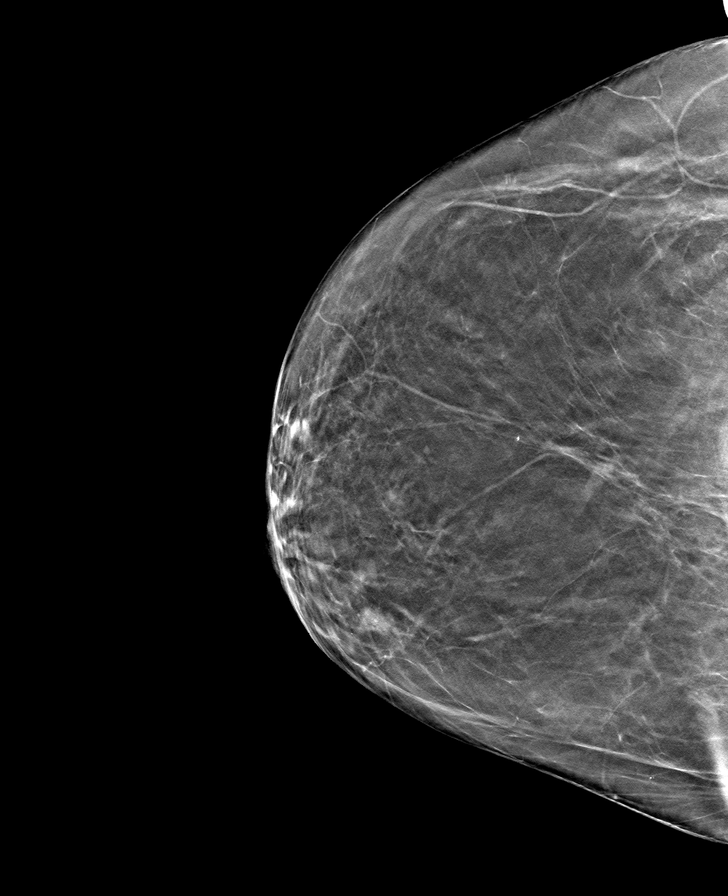

[8 of 24 positions shown; findings below may reference images not displayed]

ACR Breast Density Category b: There are scattered areas of
fibroglandular density.
FINDINGS: There are no findings suspicious for malignancy.
IMPRESSION: No mammographic evidence of malignancy. A result letter of this
screening mammogram will be mailed directly to the patient.

RECOMMENDATION:
Screening mammogram in one year. (Code:[BY])

BI-RADS CATEGORY  1: Negative.

## 2021-03-01 ENCOUNTER — Other Ambulatory Visit: Payer: Self-pay | Admitting: Physician Assistant

## 2021-03-01 DIAGNOSIS — D696 Thrombocytopenia, unspecified: Secondary | ICD-10-CM

## 2021-03-02 ENCOUNTER — Inpatient Hospital Stay: Payer: Medicare HMO | Admitting: Physician Assistant

## 2021-03-02 ENCOUNTER — Inpatient Hospital Stay: Payer: Medicare HMO | Attending: Physician Assistant

## 2021-03-02 ENCOUNTER — Other Ambulatory Visit: Payer: Self-pay

## 2021-03-02 VITALS — BP 133/81 | HR 99 | Temp 97.8°F | Resp 19 | Ht 64.0 in | Wt 190.4 lb

## 2021-03-02 DIAGNOSIS — R161 Splenomegaly, not elsewhere classified: Secondary | ICD-10-CM | POA: Insufficient documentation

## 2021-03-02 DIAGNOSIS — G35 Multiple sclerosis: Secondary | ICD-10-CM | POA: Insufficient documentation

## 2021-03-02 DIAGNOSIS — Z79899 Other long term (current) drug therapy: Secondary | ICD-10-CM | POA: Insufficient documentation

## 2021-03-02 DIAGNOSIS — D696 Thrombocytopenia, unspecified: Secondary | ICD-10-CM | POA: Insufficient documentation

## 2021-03-02 DIAGNOSIS — K760 Fatty (change of) liver, not elsewhere classified: Secondary | ICD-10-CM | POA: Insufficient documentation

## 2021-03-02 DIAGNOSIS — R251 Tremor, unspecified: Secondary | ICD-10-CM | POA: Insufficient documentation

## 2021-03-02 LAB — CBC WITH DIFFERENTIAL (CANCER CENTER ONLY)
Abs Immature Granulocytes: 0.01 10*3/uL (ref 0.00–0.07)
Basophils Absolute: 0 10*3/uL (ref 0.0–0.1)
Basophils Relative: 0 %
Eosinophils Absolute: 0.1 10*3/uL (ref 0.0–0.5)
Eosinophils Relative: 1 %
HCT: 35.1 % — ABNORMAL LOW (ref 36.0–46.0)
Hemoglobin: 11.9 g/dL — ABNORMAL LOW (ref 12.0–15.0)
Immature Granulocytes: 0 %
Lymphocytes Relative: 12 %
Lymphs Abs: 0.5 10*3/uL — ABNORMAL LOW (ref 0.7–4.0)
MCH: 32.9 pg (ref 26.0–34.0)
MCHC: 33.9 g/dL (ref 30.0–36.0)
MCV: 97 fL (ref 80.0–100.0)
Monocytes Absolute: 0.2 10*3/uL (ref 0.1–1.0)
Monocytes Relative: 5 %
Neutro Abs: 3.6 10*3/uL (ref 1.7–7.7)
Neutrophils Relative %: 82 %
Platelet Count: 103 10*3/uL — ABNORMAL LOW (ref 150–400)
RBC: 3.62 MIL/uL — ABNORMAL LOW (ref 3.87–5.11)
RDW: 13.1 % (ref 11.5–15.5)
WBC Count: 4.4 10*3/uL (ref 4.0–10.5)
nRBC: 0 % (ref 0.0–0.2)

## 2021-03-02 LAB — CMP (CANCER CENTER ONLY)
ALT: 32 U/L (ref 0–44)
AST: 21 U/L (ref 15–41)
Albumin: 4.2 g/dL (ref 3.5–5.0)
Alkaline Phosphatase: 81 U/L (ref 38–126)
Anion gap: 10 (ref 5–15)
BUN: 17 mg/dL (ref 8–23)
CO2: 24 mmol/L (ref 22–32)
Calcium: 9.7 mg/dL (ref 8.9–10.3)
Chloride: 104 mmol/L (ref 98–111)
Creatinine: 1.02 mg/dL — ABNORMAL HIGH (ref 0.44–1.00)
GFR, Estimated: >60 mL/min (ref >60)
Glucose, Bld: 154 mg/dL — ABNORMAL HIGH (ref 70–99)
Potassium: 4 mmol/L (ref 3.5–5.1)
Sodium: 138 mmol/L (ref 135–145)
Total Bilirubin: 0.5 mg/dL (ref 0.3–1.2)
Total Protein: 6.6 g/dL (ref 6.5–8.1)

## 2021-03-02 MED ORDER — PREDNISONE 50 MG PO TABS: ORAL_TABLET | ORAL | 0 refills | Status: DC

## 2021-03-02 NOTE — Progress Notes (Signed)
[place] [phone]) [phone]   Fax:([phone]) [place]  Patient Care Team: Merrilee Seashore, MD as PCP - General (Internal Medicine)  Hematological/Oncological History 1) Labs since starting Cladrib[facility]) for multiple sclerosis in February 2020:  08/29/2018: WBC 5.2, Hgb 13.1, Plt 135K, Lymphocyte 0.2 01/03/2019: WBC 4.5, Hgb 13.8, Plt 147K, Lymphocyte 0.3 06/05/2019: WBC 5.9, Hgb 13.7, Plt 145K, Lymphocyte 0.7 10/07/2019: WBC 4.0, Hgb 13.1, Plt 102K, Lymphocyte 0.1 02/12/2020: WBC 4.1, Hgb 12.6, Plt 89K, Lymphocyte 0.3 02/27/2020: WBC 4.0, Hgb 12.6, Plt 91K, Lymphocyte 0.3 03/31/2020: WBC 4.7, Hgb 13.0, Plt 91K, Lymphocyte 0.3 05/06/2020: WBC 4.6, Hgb 13.1, Plt 98K, Lymphocyte 0.4 08/12/2020: WBC 4.5, Hgb 12.0, Pkt 78K, Lymphocyte 0.4  2) 08/17/2020: Establish care with Dr. Lorenso Courier and Dede Query PA-C  CHIEF COMPLAINT: Thrombocytopenia   HISTORY OF PRESENTING ILLNESS:  Helen Sandoval 60 y.o. female returns for a follow up for thrombocytopenia. Patient is unaccompanied for this visit. Her last visit was on 12/01/2020. She reports no major changes to her health in the interim.    On exam today , she reports her energy and appetite are overall stable. She is continuing to try to loose weight. She denies any nausea, vomiting or abdominal pain. Her bowel movements are regular without any diarrhea or constipation. Two weeks ago, she tripped and suffered a fall that caused bruising in her legs which has now resolved.  She continues to have tremors. Patient denies any fevers, chills, shortness of breath, chest pain, cough, neuropathy or skin changes. She has no other complaints. Rest of the 10 point ROS is below.   MEDICAL HISTORY:  Past Medical History:  Diagnosis Date   Ankle fracture    Diabetes ([place])    Fatty liver    Osteopenia    Scarlet fever    Shingles    Sleep apnea    Spina bifida    Splenomegaly     Thrombocytopenia (HCC)    Vitamin D deficiency     SURGICAL HISTORY: Past Surgical History:  Procedure Laterality Date   ABDOMINAL HYSTERECTOMY     fibroids and heavy bleeding   BREAST BIOPSY Right    20+ yrs ago   CERVICAL FUSION      SOCIAL HISTORY: Social History   Socioeconomic History   Marital status: Divorced    Spouse name: Not on file   Number of children: Not on file   Years of education: Not on file   Highest education level: Not on file  Occupational History   Not on file  Tobacco Use   Smoking status: Some Days   Smokeless tobacco: Never   Tobacco comments:    3 cigarrettes per day.   Substance and Sexual Activity   Alcohol use: Yes    Comment: occasional use. a few times a year.    Drug use: No   Sexual activity: Not on file  Other Topics Concern   Not on file  Social History Narrative   Not on file   Social Determinants of Health   Financial Resource Strain: Not on file  Food Insecurity: Not on file  Transportation Needs: Not on file  Physical Activity: Not on file  Stress: Not on file  Social Connections: Not on file  Intimate Partner Violence: Not on file    FAMILY HISTORY: Family History  Problem Relation Age of Onset   Bladder Cancer Mother    Colon cancer Mother    Colon cancer Father  diagnosed less than age of 37.    Bladder Cancer Maternal Grandmother    Leukemia Maternal Grandmother     ALLERGIES:  is allergic to clindamycin, indomethacin, levofloxacin, penicillins, penicillins cross reactors, sulfa antibiotics, levaquin [levofloxacin hemihydrate], lipitor  [atorvastatin calcium], multihance [gadobenate], sulfamethoxazole-trimethoprim, and zocor [simvastatin].  MEDICATIONS:  Current Outpatient Medications  Medication Sig Dispense Refill   alendronate (FOSAMAX) 70 MG tablet Take 70 mg by mouth once a week. Take with a full glass of water on an empty stomach.     APPLE CIDER VINEGAR PO Take 625 mg by mouth daily. 2 caps  per day     aspirin EC 81 MG tablet Take 81 mg by mouth every other day.     Biotin 10000 MCG TABS Take by mouth daily.      buPROPion (WELLBUTRIN SR) 150 MG 12 hr tablet Take 150 mg by mouth 2 (two) times daily.      busPIRone (BUSPAR) 15 MG tablet Take 1 tablet (15 mg total) by mouth 2 (two) times daily. 180 tablet 3   cholecalciferol (VITAMIN D) 1000 UNITS tablet Take 5,000 Units by mouth daily.     Cladribine (MAVENCLAD, 9 TABS, PO) Take by mouth.     cyclobenzaprine (FLEXERIL) 5 MG tablet Take 1 tablet (5 mg total) by mouth 3 (three) times daily as needed. 90 tablet 5   estradiol (CLIMARA - DOSED IN MG/24 HR) 0.0375 mg/24hr patch estradiol 0.0375 mg/24 hr weekly transdermal patch     ezetimibe (ZETIA) 10 MG tablet Take 10 mg by mouth daily.      gabapentin (NEURONTIN) 300 MG capsule Take 1 capsule (300 mg total) by mouth 3 (three) times daily. 270 capsule 3   IRON-FOLIC ACID-VIT K53 PO Take by mouth.     Krill Oil 1000 MG CAPS Take 1,000 mg by mouth daily.      metFORMIN (GLUCOPHAGE-XR) 500 MG 24 hr tablet Take 1 tablet by mouth daily.     MILK THISTLE PO Take by mouth.     MYRBETRIQ 50 MG TB24 tablet Take 1 tablet by mouth once daily 90 tablet 3   omeprazole (PRILOSEC) 20 MG capsule Take 20 mg by mouth daily.      OVER THE COUNTER MEDICATION Liver Detox     phentermine 37.5 MG capsule Take 1 capsule (37.5 mg total) by mouth every morning. 30 capsule 5   predniSONE (DELTASONE) 50 MG tablet Take 1 tablet 13 hours, 7 hours and 1 hour (with benadryl) before MRI scan. 3 tablet 0   PSYLLIUM HUSK PO Take by mouth.     traZODone (DESYREL) 100 MG tablet Take 1 tablet (100 mg total) by mouth at bedtime. 90 tablet 3   TURMERIC PO Take 1 capsule by mouth every morning. +Blackpepper 600mg /5mg      venlafaxine XR (EFFEXOR-XR) 150 MG 24 hr capsule Take 2 capsules by mouth daily.     No current facility-administered medications for this visit.    REVIEW OF SYSTEMS:   Constitutional: ( - ) fevers, (  - )  chills , ( - ) night sweats Eyes: ( - ) blurriness of vision, ( - ) double vision, ( - ) watery eyes Ears, nose, mouth, throat, and face: ( - ) mucositis, ( - ) sore throat Respiratory: ( - ) cough, ( - ) dyspnea, ( - ) wheezes Cardiovascular: ( - ) palpitation, ( - ) chest discomfort, ( - ) lower extremity swelling Gastrointestinal:  ( - ) nausea, ( - )  heartburn, ( - ) change in bowel habits Skin: ( - ) abnormal skin rashes Lymphatics: ( - ) new lymphadenopathy, ( - ) easy bruising Neurological: ( - ) numbness, ( - ) tingling, ( - ) new weaknesses Behavioral/Psych: ( - ) mood change, ( - ) new changes  All other systems were reviewed with the patient and are negative.  PHYSICAL EXAMINATION: ECOG PERFORMANCE STATUS: 0 - Asymptomatic  Vitals:   03/02/21 1332  BP: 133/81  Pulse: 99  Resp: 19  Temp: 97.8 F (36.6 C)  SpO2: 98%   Filed Weights   03/02/21 1332  Weight: 190 lb 6.4 oz (86.4 kg)    GENERAL: well appearing female in NAD  SKIN: skin color, texture, turgor are normal, or significant lesions. EYES: conjunctiva are pink and non-injected, sclera clear LUNGS: clear to auscultation and percussion with normal breathing effort HEART: regular rate & rhythm and no murmurs and no lower extremity edema ABDOMEN: soft, non-tender, non-distended, normal bowel sounds Musculoskeletal: no cyanosis of digits and no clubbing  PSYCH: alert & oriented x 3, fluent speech NEURO: no focal motor/sensory deficits  LABORATORY DATA:  I have reviewed the data as listed CBC Latest Ref Rng & Units 03/02/2021 02/17/2021 12/01/2020  WBC 4.0 - 10.5 K/uL 4.4 5.2 4.7  Hemoglobin 12.0 - 15.0 g/dL 11.9(L) 12.6 13.4  Hematocrit 36.0 - 46.0 % 35.1(L) 37.1 38.2  Platelets 150 - 400 K/uL 103(L) 113(L) 111(L)    CMP Latest Ref Rng & Units 03/02/2021 02/17/2021 12/01/2020  Glucose 70 - 99 mg/dL 154(H) - 119(H)  BUN 8 - 23 mg/dL 17 - 18  Creatinine 0.44 - 1.00 mg/dL 1.02(H) - 1.23(H)  Sodium 135 - 145  mmol/L 138 - 140  Potassium 3.5 - 5.1 mmol/L 4.0 - 4.6  Chloride 98 - 111 mmol/L 104 - 102  CO2 22 - 32 mmol/L 24 - 28  Calcium 8.9 - 10.3 mg/dL 9.7 - 10.4(H)  Total Protein 6.5 - 8.1 g/dL 6.6 6.4 7.2  Total Bilirubin 0.3 - 1.2 mg/dL 0.5 0.3 0.8  Alkaline Phos 38 - 126 U/L 81 80 66  AST 15 - 41 U/L 21 18 23   ALT 0 - 44 U/L 32 27 40   RADIOGRAPHIC STUDIES: MM 3D SCREEN BREAST BILATERAL  Result Date: 02/26/2021 CLINICAL DATA:  Screening. EXAM: DIGITAL SCREENING BILATERAL MAMMOGRAM WITH TOMOSYNTHESIS AND CAD TECHNIQUE: Bilateral screening digital craniocaudal and mediolateral oblique mammograms were obtained. Bilateral screening digital breast tomosynthesis was performed. The images were evaluated with computer-aided detection. COMPARISON:  Previous exam(s). ACR Breast Density Category b: There are scattered areas of fibroglandular density. FINDINGS: There are no findings suspicious for malignancy. IMPRESSION: No mammographic evidence of malignancy. A result letter of this screening mammogram will be mailed directly to the patient. RECOMMENDATION: Screening mammogram in one year. (Code:SM-B-01Y) BI-RADS CATEGORY  1: Negative. Electronically Signed   By: Lillia Mountain M.D.   On: 02/26/2021 11:39     ASSESSMENT & PLAN Detta Mellin is a 60 y.o. female presenting to the clinic for evaluation for thrombocytopenia.  #Thrombocytopenia likely 2/2 medication and fatty liver with splenomegaly:  --Patient is currently taking Cladribine ([place]) for multiple sclerosis which can cause thrombocytopenia --There is evidence of fatty liver disease suggesting NAFLD and mild splenomegaly that was seen on the past MRIs from 09/11/2020 and 11/26/2020. This can be a contributing factor for thrombocytopenia.  --Labs from 08/17/2020 ruled out B12 and folate deficiencies. There is no evidence of pseudothrombocytopenia. --Labs today show mild  decrease in platelet count to 103K. WBC is normal. Hgb is mildly decreased to  11.9. No further intervention is recommended at this time. Continue to monitor for now.  --RTC in 6 months with labs and will decide if additional follow up is needed   #Left hepatic lobe lesion: --Seen on abdominal US on 08/27/2020 that indicated a 3.1 cm hypoechoic area in the left lobe of the liver.  --MRI abdomen from 09/11/2020 revealed 2.5 x 2.4 x 2.3 cm subcapsular lesion in the inferior aspect of the lateral segment left liver (segment III). 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 Recommended 3 month follow up.  --MRI abdomen from shows stable size of a 2.4 x 1.9 cm LEFT hepatic lobe lesion compared to the prior study dating back to 2012 and displays similar enhancement characteristics and signs of shunting. 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 --No further workup is required at this time. Continue to monitor.   #Severe hepatic steatosis: --Seen on MRIs from 09/11/2020 and 11/26/2020. --Reviewed findings and there is strong evidence of NAFLD. --Patient is currently under the care of gastroenterologist, Dr. Ronnette Juniper who is monitoring patient's labs to assess hepatic function.   No orders of the defined types were placed in this encounter.   All questions were answered. The patient knows to call the clinic with any problems, questions or concerns.  I have spent a total of 25 minutes minutes of face-to-face and non-face-to-face time, preparing to see the patient, performing a medically appropriate examination, counseling and educating the patient, ordering tests, documenting clinical information in the electronic health record, and care coordination.   Dede Query, PA-C Department of Hematology/Oncology [place] at [facility] Phone: [phone]

## 2021-03-09 DIAGNOSIS — E782 Mixed hyperlipidemia: Secondary | ICD-10-CM | POA: Diagnosis not present

## 2021-03-09 DIAGNOSIS — E1165 Type 2 diabetes mellitus with hyperglycemia: Secondary | ICD-10-CM | POA: Diagnosis not present

## 2021-03-09 DIAGNOSIS — R5383 Other fatigue: Secondary | ICD-10-CM | POA: Diagnosis not present

## 2021-03-16 DIAGNOSIS — D696 Thrombocytopenia, unspecified: Secondary | ICD-10-CM | POA: Diagnosis not present

## 2021-03-16 DIAGNOSIS — Z23 Encounter for immunization: Secondary | ICD-10-CM | POA: Diagnosis not present

## 2021-03-16 DIAGNOSIS — G709 Myoneural disorder, unspecified: Secondary | ICD-10-CM | POA: Diagnosis not present

## 2021-03-16 DIAGNOSIS — F321 Major depressive disorder, single episode, moderate: Secondary | ICD-10-CM | POA: Diagnosis not present

## 2021-03-16 DIAGNOSIS — Z0000 Encounter for general adult medical examination without abnormal findings: Secondary | ICD-10-CM | POA: Diagnosis not present

## 2021-03-16 DIAGNOSIS — E1165 Type 2 diabetes mellitus with hyperglycemia: Secondary | ICD-10-CM | POA: Diagnosis not present

## 2021-03-22 ENCOUNTER — Other Ambulatory Visit: Payer: Self-pay | Admitting: Neurology

## 2021-03-22 ENCOUNTER — Other Ambulatory Visit: Payer: Self-pay | Admitting: *Deleted

## 2021-03-22 DIAGNOSIS — G35 Multiple sclerosis: Secondary | ICD-10-CM

## 2021-03-22 DIAGNOSIS — R269 Unspecified abnormalities of gait and mobility: Secondary | ICD-10-CM

## 2021-03-22 DIAGNOSIS — G4733 Obstructive sleep apnea (adult) (pediatric): Secondary | ICD-10-CM

## 2021-03-22 NOTE — Telephone Encounter (Signed)
Can you put new orders in for [facility]?

## 2021-03-22 NOTE — Telephone Encounter (Signed)
Mcarthur Rossetti Josem Kaufmann [phone] (exp. 04/22/21 to 05/22/21) sent for [place] [facility]

## 2021-03-31 ENCOUNTER — Other Ambulatory Visit: Payer: Self-pay | Admitting: Neurology

## 2021-03-31 DIAGNOSIS — Z01411 Encounter for gynecological examination (general) (routine) with abnormal findings: Secondary | ICD-10-CM | POA: Diagnosis not present

## 2021-03-31 DIAGNOSIS — Z79890 Hormone replacement therapy (postmenopausal): Secondary | ICD-10-CM | POA: Diagnosis not present

## 2021-03-31 DIAGNOSIS — Z90711 Acquired absence of uterus with remaining cervical stump: Secondary | ICD-10-CM | POA: Diagnosis not present

## 2021-03-31 DIAGNOSIS — Z124 Encounter for screening for malignant neoplasm of cervix: Secondary | ICD-10-CM | POA: Diagnosis not present

## 2021-03-31 DIAGNOSIS — Z01419 Encounter for gynecological examination (general) (routine) without abnormal findings: Secondary | ICD-10-CM | POA: Diagnosis not present

## 2021-03-31 DIAGNOSIS — M810 Age-related osteoporosis without current pathological fracture: Secondary | ICD-10-CM | POA: Diagnosis not present

## 2021-03-31 DIAGNOSIS — Z113 Encounter for screening for infections with a predominantly sexual mode of transmission: Secondary | ICD-10-CM | POA: Diagnosis not present

## 2021-03-31 DIAGNOSIS — Z6832 Body mass index (BMI) 32.0-32.9, adult: Secondary | ICD-10-CM | POA: Diagnosis not present

## 2021-03-31 MED ORDER — OXYBUTYNIN CHLORIDE ER 15 MG PO TB24: 15.0000 mg | ORAL_TABLET | Freq: Every day | ORAL | 0 refills | Status: DC

## 2021-04-19 DIAGNOSIS — H52209 Unspecified astigmatism, unspecified eye: Secondary | ICD-10-CM | POA: Diagnosis not present

## 2021-04-22 DIAGNOSIS — H5213 Myopia, bilateral: Secondary | ICD-10-CM | POA: Diagnosis not present

## 2021-05-12 ENCOUNTER — Ambulatory Visit (HOSPITAL_COMMUNITY): Payer: Medicare HMO

## 2021-05-12 ENCOUNTER — Other Ambulatory Visit (HOSPITAL_COMMUNITY): Payer: Medicare HMO

## 2021-05-12 ENCOUNTER — Encounter (HOSPITAL_COMMUNITY): Payer: Self-pay

## 2021-05-13 ENCOUNTER — Telehealth: Payer: Self-pay | Admitting: Neurology

## 2021-05-13 NOTE — Telephone Encounter (Signed)
Mcarthur Rossetti Josem Kaufmann: [phone] & [phone] (exp. 05/13/21 to 06/12/21)   Patient is scheduled at [facility] for 05/19/21.

## 2021-05-17 ENCOUNTER — Other Ambulatory Visit: Payer: Self-pay | Admitting: Neurology

## 2021-05-19 ENCOUNTER — Ambulatory Visit (HOSPITAL_COMMUNITY)
Admission: RE | Admit: 2021-05-19 | Discharge: 2021-05-19 | Disposition: A | Discharge status: Home / Self Care | Payer: Medicare HMO | Source: Ambulatory Visit | Attending: Neurology | Admitting: Neurology

## 2021-05-19 ENCOUNTER — Other Ambulatory Visit: Payer: Self-pay

## 2021-05-19 DIAGNOSIS — G35 Multiple sclerosis: Secondary | ICD-10-CM

## 2021-05-19 DIAGNOSIS — R269 Unspecified abnormalities of gait and mobility: Secondary | ICD-10-CM | POA: Diagnosis not present

## 2021-05-19 DIAGNOSIS — M4802 Spinal stenosis, cervical region: Secondary | ICD-10-CM | POA: Diagnosis not present

## 2021-05-19 IMAGING — MR MR CERVICAL SPINE WO/W CM
21 of 23 series · 45 of 48 positions shown · IV contrast (gadavist)
Comparison: Head and cervical spine MRI [DATE].

CLINICAL DATA: Multiple sclerosis.  Gait abnormality.

EXAM:
MRI HEAD WITHOUT AND WITH CONTRAST
MRI CERVICAL SPINE WITHOUT AND WITH CONTRAST
TECHNIQUE: Multiplanar, multiecho pulse sequences of the brain and surrounding
structures, and cervical spine, to include the craniocervical
junction and cervicothoracic junction, were obtained without and
with intravenous contrast.
CONTRAST:  10mL GADAVIST GADOBUTROL 1 MMOL/ML IV SOLN

[Series 5: DWI · axial · 3.0mm · 1.36mm/px · z∈[-43,+120]mm · 4 of 112 slices shown (1 of 4)]
[im 1/112]
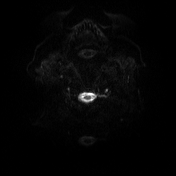
[im 38/112]
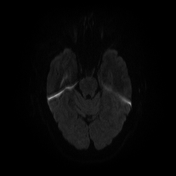
[im 75/112]
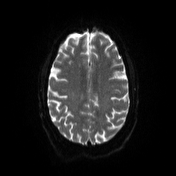
[im 112/112]
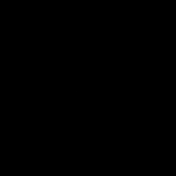

[Series 6: DWI · axial · 3.0mm · 1.36mm/px · 1 of 55 slices shown (2 of 4)]
[im 1/55]
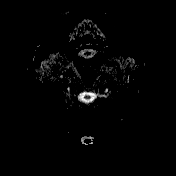

[Series 7: T1 · sagittal · 5.0mm · 0.75mm/px · 1 of 24 slices shown (1 of 4)]
[im 1/24]
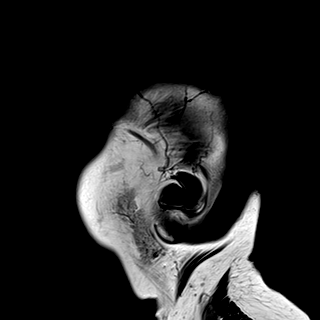

[Series 8: FLAIR · sagittal · 1.1mm · 0.50mm/px · 4 of 117 slices shown (1 of 2)]
[im 1/117]
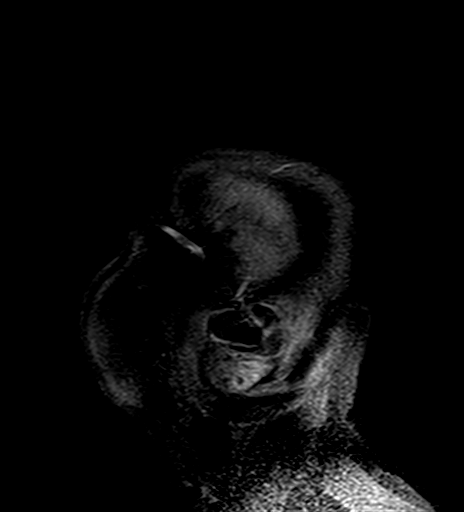
[im 39/117]
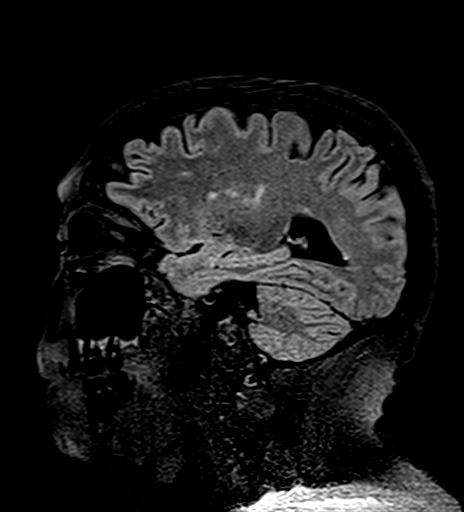
[im 78/117]
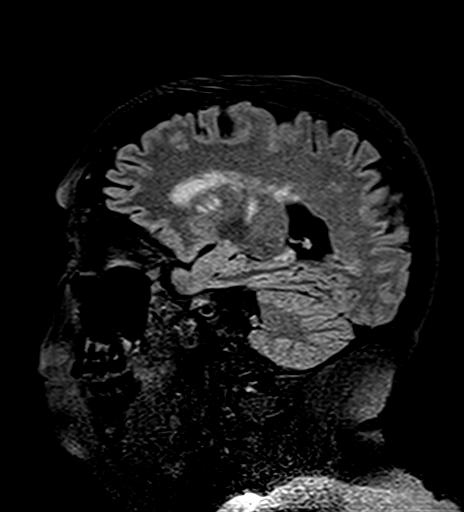
[im 117/117]
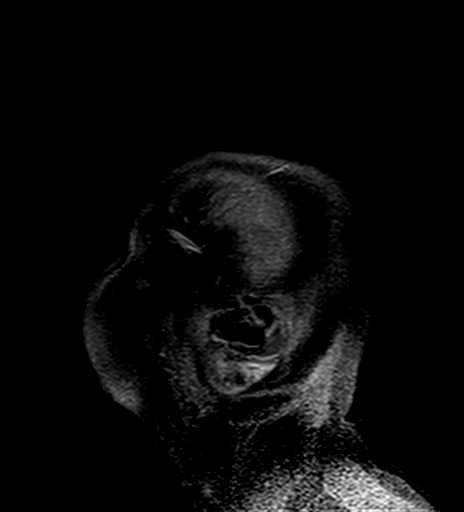

[Series 9: T2 · axial · 5.0mm · 0.62mm/px · 1 of 21 slices shown (1 of 4)]
[im 1/21]
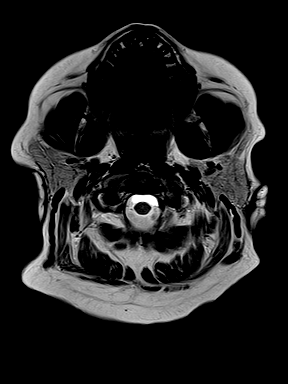

[Series 10: swi_images · axial · 3.0mm · 0.75mm/px · z∈[-67,+143]mm · 3 of 72 slices shown]
[im 1/72]
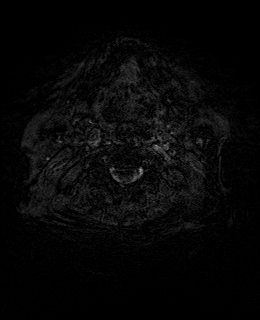
[im 36/72]
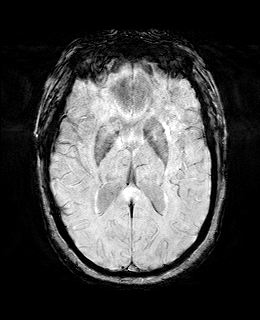
[im 72/72]
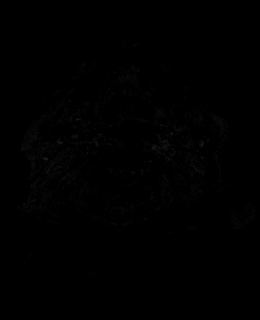

[Series 12: FLAIR · axial · 3.0mm · 0.75mm/px · z∈[-43,+114]mm · 2 of 54 slices shown (2 of 2)]
[im 1/54]
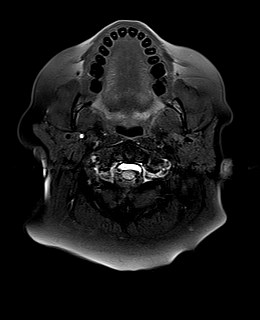
[im 54/54]
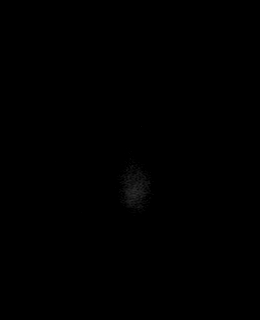

[Series 13: T1 · axial · 1.0mm · 0.94mm/px · z∈[-34,+122]mm · 7 of 160 slices shown (2 of 4)]
[im 1/160]
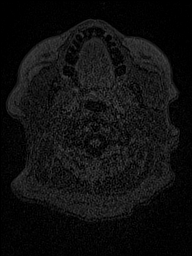
[im 27/160]
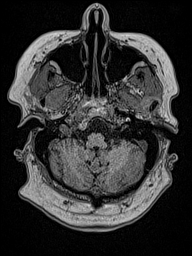
[im 54/160]
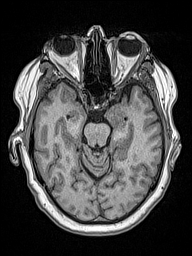
[im 80/160]
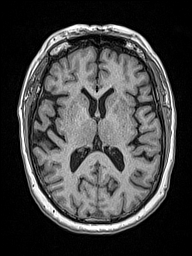
[im 107/160]
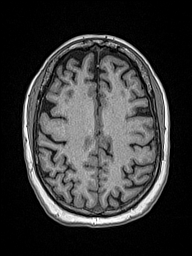
[im 133/160]
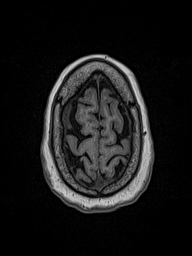
[im 160/160]
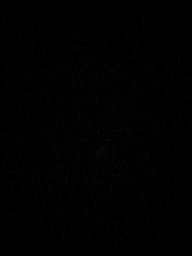

[Series 14: DWI · coronal · 5.0mm · 1.31mm/px · 3 of 73 slices shown (3 of 4)]
[im 1/73]
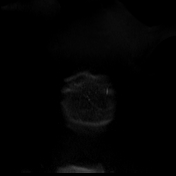
[im 37/73]
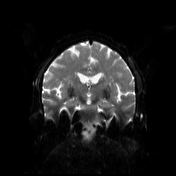
[im 73/73]
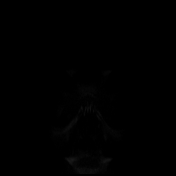

[Series 15: DWI · coronal · 5.0mm · 1.31mm/px · 2 of 38 slices shown (4 of 4)]
[im 1/38]
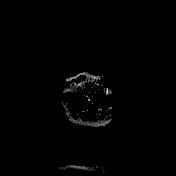
[im 38/38]
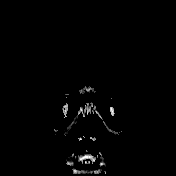

[Series 16: T2 · coronal · 5.0mm · 0.57mm/px · 1 of 28 slices shown (2 of 4)]
[im 1/28]
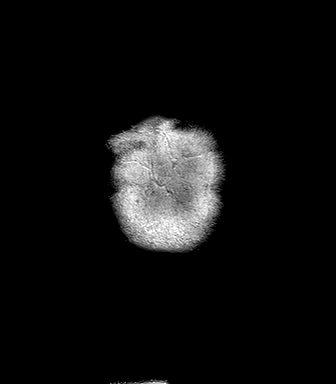

[Series 25: T1 · sagittal · 3.0mm · 0.69mm/px · 1 of 14 slices shown (3 of 4)]
[im 1/14]
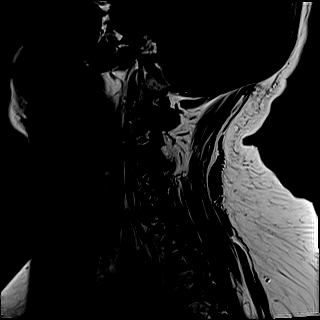

[Series 26: T2 · sagittal · 3.0mm · 0.69mm/px · 1 of 14 slices shown (3 of 4)]
[im 1/14]
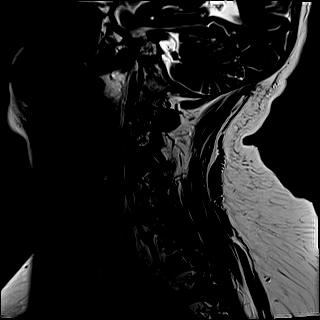

[Series 27: STIR · sagittal · 3.0mm · 0.86mm/px · 1 of 13 slices shown]
[im 1/13]
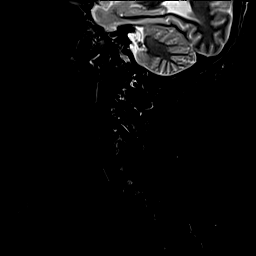

[Series 28: T2 · axial · 3.0mm · 0.70mm/px · 1 of 30 slices shown (4 of 4)]
[im 1/30]
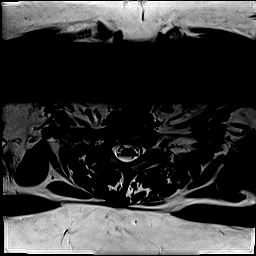

[Series 30: T1 · axial · 3.0mm · 0.35mm/px · 1 of 30 slices shown (4 of 4)]
[im 1/30]
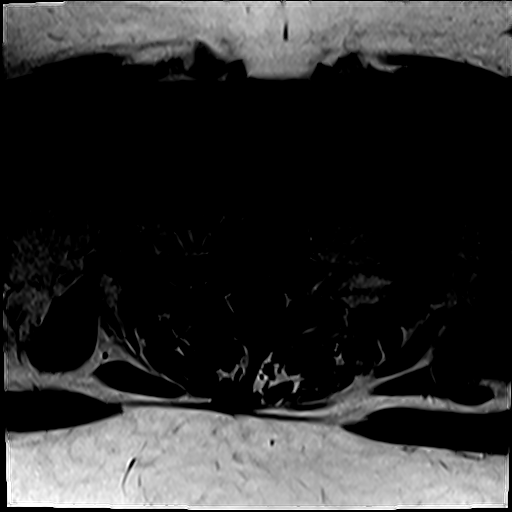

[Series 31: T1 fat-sat post-contrast · sagittal · 3.0mm · 0.69mm/px · 1 of 14 slices shown]
[im 1/14]
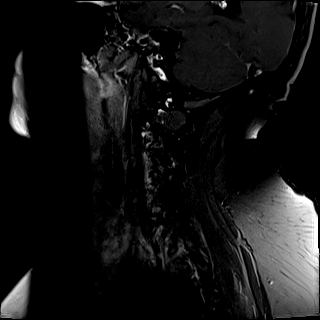

[Series 32: T1 post-contrast · axial · 3.0mm · 0.35mm/px · 1 of 30 slices shown (1 of 4)]
[im 1/30]
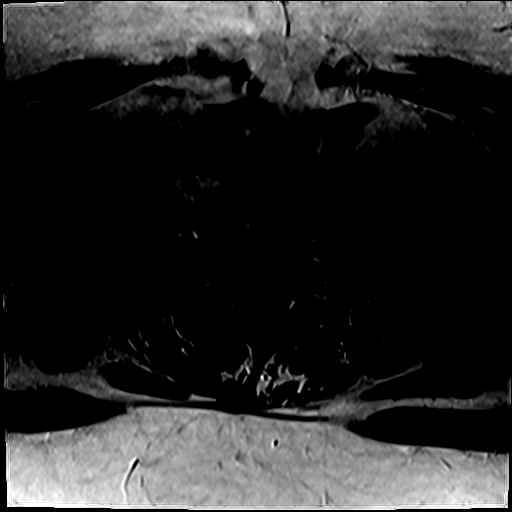

[Series 37: T1 post-contrast · axial · 1.0mm · 0.94mm/px · z∈[-35,+120]mm · 7 of 160 slices shown (2 of 4)]
[im 1/160]
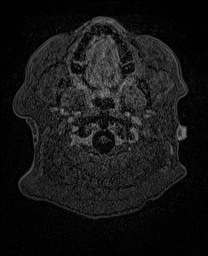
[im 27/160]
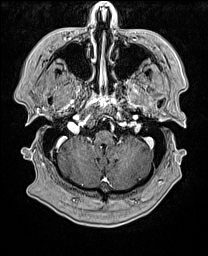
[im 54/160]
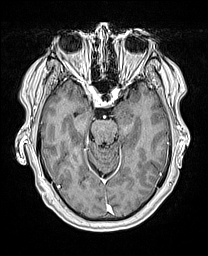
[im 80/160]
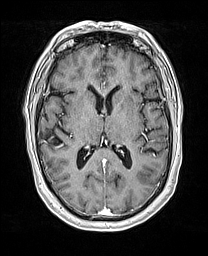
[im 107/160]
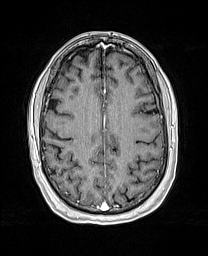
[im 133/160]
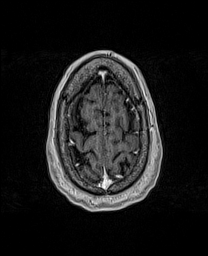
[im 160/160]
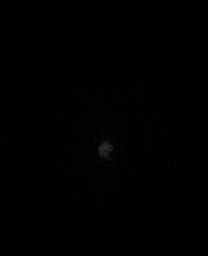

[Series 38: T1 post-contrast · coronal · 5.0mm · 0.43mm/px · 1 of 30 slices shown (3 of 4)]
[im 1/30]
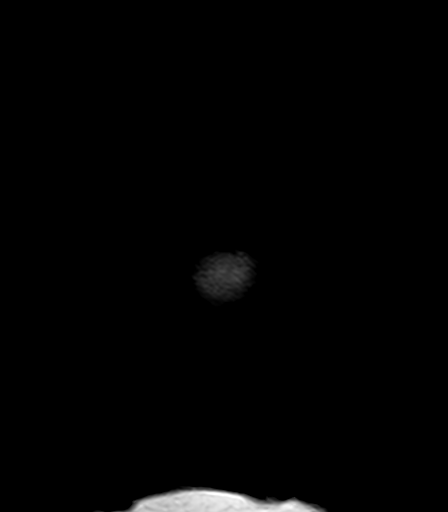

[Series 39: T1 post-contrast · sagittal · 5.0mm · 0.75mm/px · 1 of 24 slices shown (4 of 4)]
[im 1/24]
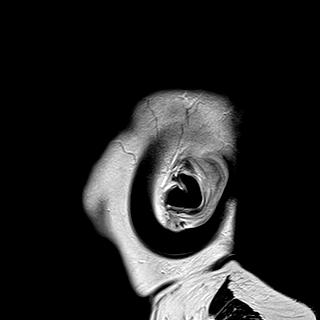

[45 of 48 positions shown; findings below may reference images not displayed]

FINDINGS: MRI HEAD FINDINGS

Brain: There is no evidence of an acute infarct, intracranial
hemorrhage, mass, midline shift, or extra-axial fluid collection.
The ventricles and sulci are within normal limits for age. Patchy T2
hyperintensities in the subcortical, deep, and periventricular
cerebral white matter bilaterally are moderately advanced for age
and have not substantially progressed allowing for differences in
technique and slice thickness/spacing between the current and prior
examinations. These are largely nonspecific in appearance although
T2 hyperintensity in the left basal ganglia and adjacent deep white
matter tracts has an appearance most suggestive of chronic ischemia.
A small T2 hyperintense focus in the right pons is unchanged. No
abnormal enhancement is identified.

Vascular: Major intracranial vascular flow voids are preserved.

Skull and upper cervical spine: Unremarkable bone marrow signal.

Sinuses/Orbits: Unremarkable orbits. Paranasal sinuses and mastoid
air cells are clear.

Other: None.

MRI CERVICAL SPINE FINDINGS

Alignment: Chronic cervical spine straightening. Trace
anterolisthesis of C6 on C7.

Vertebrae: No fracture or suspicious marrow lesion. Persistent mild
left facet edema at C3-4. Prior C4-C6 ACDF.

Cord: Chronic punctate focus of nonenhancing T2 hyperintensity in
the posterior midline of the spinal cord at C6. No definite new cord
lesions.

Posterior Fossa, vertebral arteries, paraspinal tissues:
Unremarkable.

Disc levels:

C2-3: Mild uncovertebral spurring and severe right and mild left
facet arthrosis result in mild right neural foraminal stenosis
without spinal stenosis, unchanged.

C3-4: Disc bulging, uncovertebral spurring, and severe bilateral
facet arthrosis result in moderate spinal stenosis and moderate
bilateral neural foraminal stenosis, stable to mildly progressed.

C4-5: ACDF.  No significant stenosis.

C5-6: ACDF.  No significant stenosis.

C6-7: Disc bulging, uncovertebral spurring, and moderate facet
arthrosis result in mild spinal stenosis without significant neural
foraminal stenosis, unchanged.

C7-T1: Mild disc bulging and mild right and severe left facet
arthrosis result in mild left neural foraminal stenosis without
spinal stenosis, unchanged.
IMPRESSION: 1. Unchanged nonspecific cerebral white matter disease. No evidence
of active demyelination or other acute intracranial abnormality.
2. Unchanged punctate focus of T2 hyperintensity in the spinal cord
at C6 which could reflect myelomalacia related to spondylosis or a
chronic demyelinating plaque.
3. Moderate spinal stenosis and moderate bilateral neural foraminal
stenosis at C3-4, stable to mildly progressed.
4. Unchanged mild spinal stenosis at C6-7.

## 2021-05-19 IMAGING — MR MR HEAD WO/W CM
21 of 23 series · 44 of 48 positions shown · IV contrast (gadavist)
Comparison: Head and cervical spine MRI [DATE].

CLINICAL DATA: Multiple sclerosis.  Gait abnormality.

EXAM:
MRI HEAD WITHOUT AND WITH CONTRAST
MRI CERVICAL SPINE WITHOUT AND WITH CONTRAST
TECHNIQUE: Multiplanar, multiecho pulse sequences of the brain and surrounding
structures, and cervical spine, to include the craniocervical
junction and cervicothoracic junction, were obtained without and
with intravenous contrast.
CONTRAST:  10mL GADAVIST GADOBUTROL 1 MMOL/ML IV SOLN

[Series 5: DWI · axial · 3.0mm · 1.36mm/px · z∈[-43,+120]mm · 5 of 112 slices shown (1 of 4)]
[im 1/112]
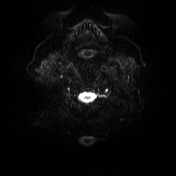
[im 28/112]
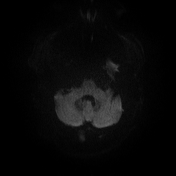
[im 56/112]
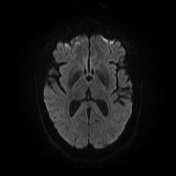
[im 84/112]
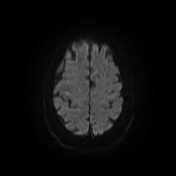
[im 112/112]
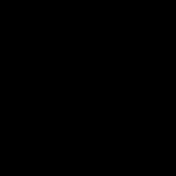

[Series 6: DWI · axial · 3.0mm · 1.36mm/px · z∈[-43,+117]mm · 2 of 55 slices shown (2 of 4)]
[im 1/55]
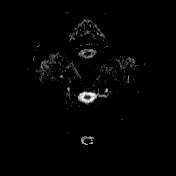
[im 55/55]
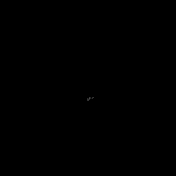

[Series 7: T1 · sagittal · 5.0mm · 0.75mm/px · 1 of 24 slices shown (1 of 4)]
[im 1/24]
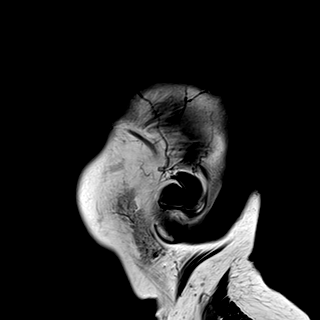

[Series 8: FLAIR · sagittal · 1.1mm · 0.50mm/px · 5 of 120 slices shown (1 of 2)]
[im 1/120]
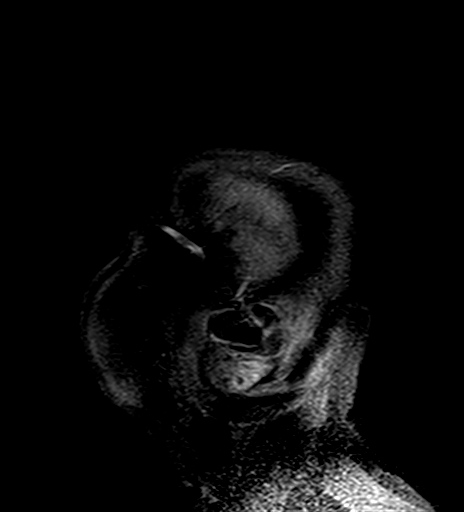
[im 30/120]
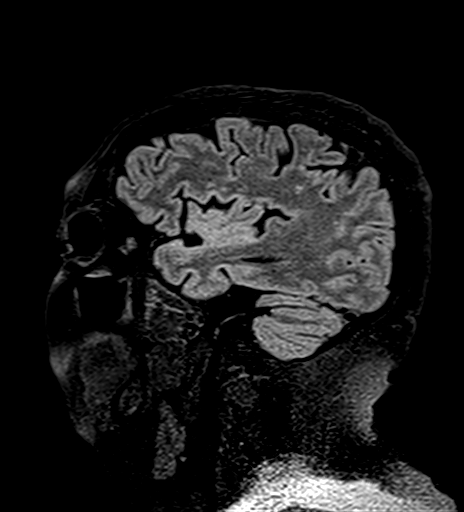
[im 60/120]
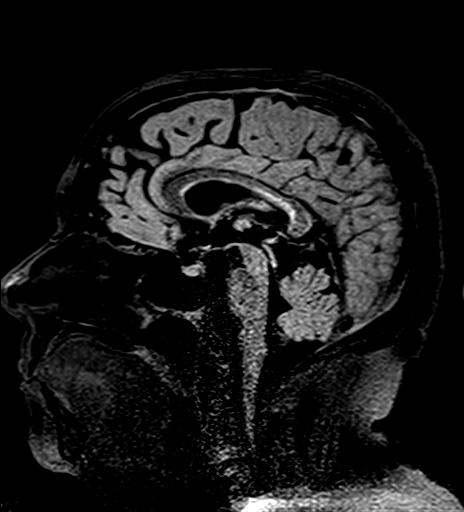
[im 90/120]
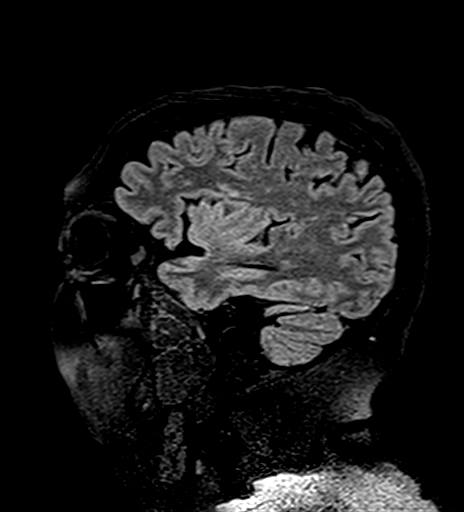
[im 120/120]
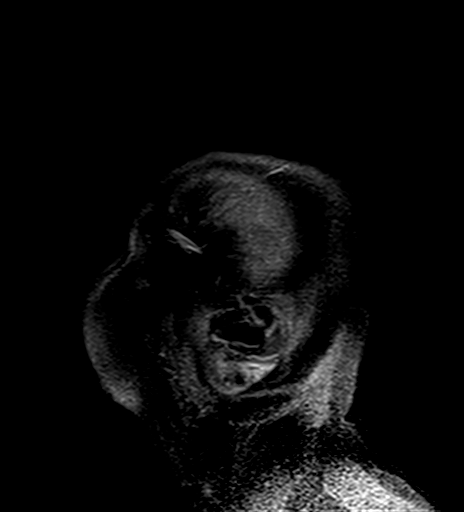

[Series 9: T2 · axial · 5.0mm · 0.62mm/px · 1 of 25 slices shown (1 of 4)]
[im 1/25]
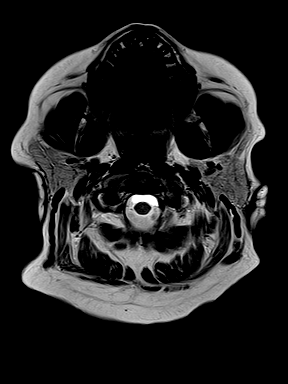

[Series 12: FLAIR · axial · 3.0mm · 0.75mm/px · z∈[-43,+114]mm · 2 of 54 slices shown (2 of 2)]
[im 1/54]
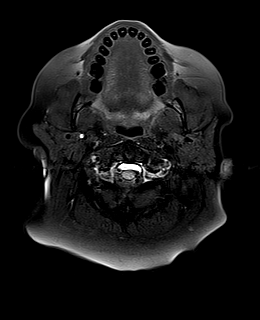
[im 54/54]
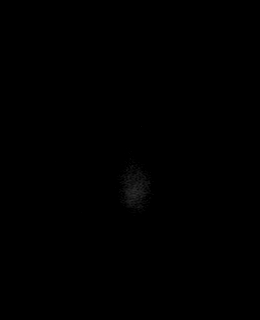

[Series 13: T1 · axial · 1.0mm · 0.94mm/px · z∈[-34,+122]mm · 6 of 160 slices shown (2 of 4)]
[im 1/160]
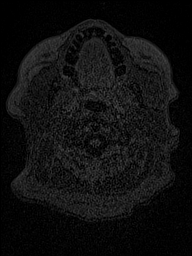
[im 32/160]
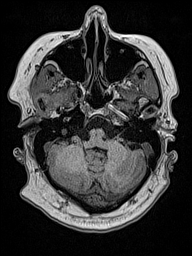
[im 64/160]
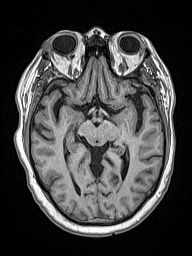
[im 96/160]
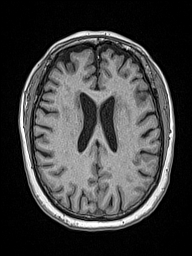
[im 128/160]
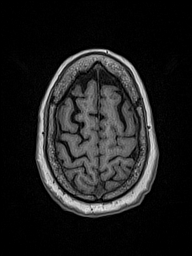
[im 160/160]
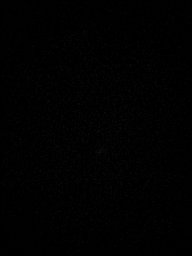

[Series 14: DWI · coronal · 5.0mm · 1.31mm/px · 3 of 76 slices shown (3 of 4)]
[im 1/76]
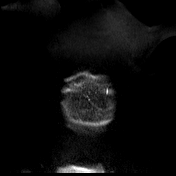
[im 38/76]
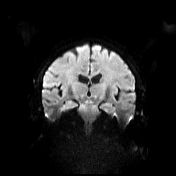
[im 76/76]
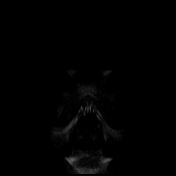

[Series 15: DWI · coronal · 5.0mm · 1.31mm/px · 2 of 38 slices shown (4 of 4)]
[im 1/38]
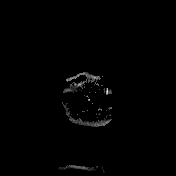
[im 38/38]
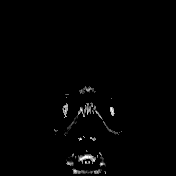

[Series 16: T2 · coronal · 5.0mm · 0.57mm/px · 1 of 28 slices shown (2 of 4)]
[im 1/28]
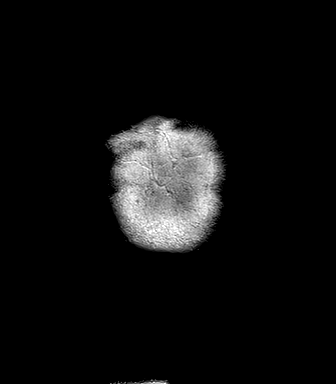

[Series 25: T1 · sagittal · 3.0mm · 0.69mm/px · 1 of 14 slices shown (3 of 4)]
[im 1/14]
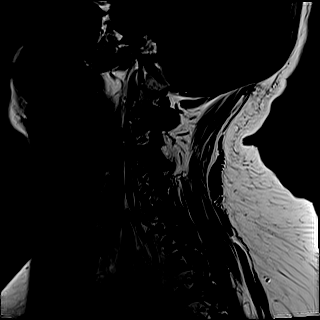

[Series 26: T2 · sagittal · 3.0mm · 0.69mm/px · 1 of 14 slices shown (3 of 4)]
[im 1/14]
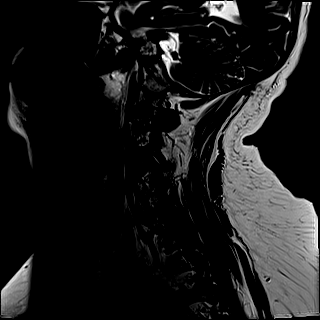

[Series 27: STIR · sagittal · 3.0mm · 0.86mm/px · 1 of 14 slices shown]
[im 1/14]
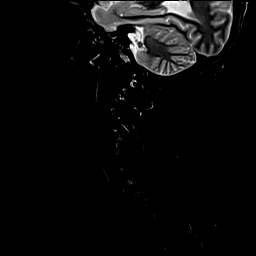

[Series 28: T2 · axial · 3.0mm · 0.70mm/px · 1 of 30 slices shown (4 of 4)]
[im 1/30]
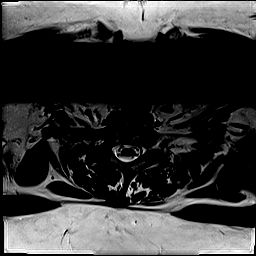

[Series 29: GRE · axial · 3.0mm · 0.35mm/px · 1 of 30 slices shown]
[im 1/30]
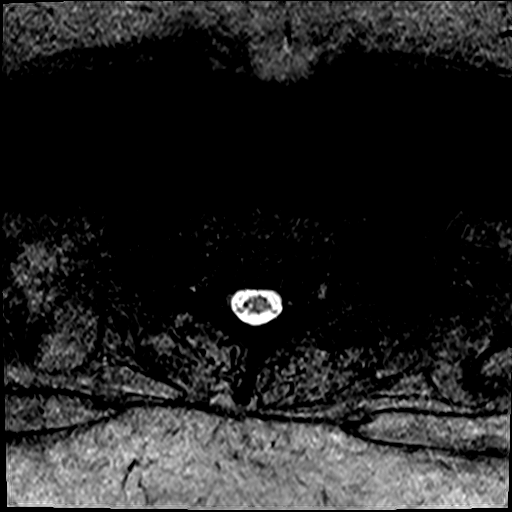

[Series 30: T1 · axial · 3.0mm · 0.35mm/px · 1 of 30 slices shown (4 of 4)]
[im 1/30]
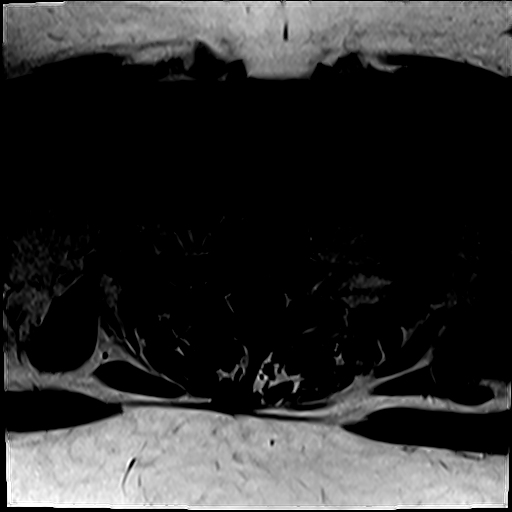

[Series 31: T1 fat-sat post-contrast · sagittal · 3.0mm · 0.69mm/px · 1 of 14 slices shown]
[im 1/14]
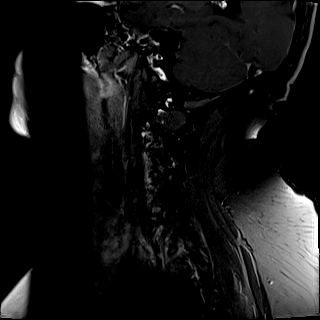

[Series 32: T1 post-contrast · axial · 3.0mm · 0.35mm/px · 1 of 30 slices shown (1 of 4)]
[im 1/30]
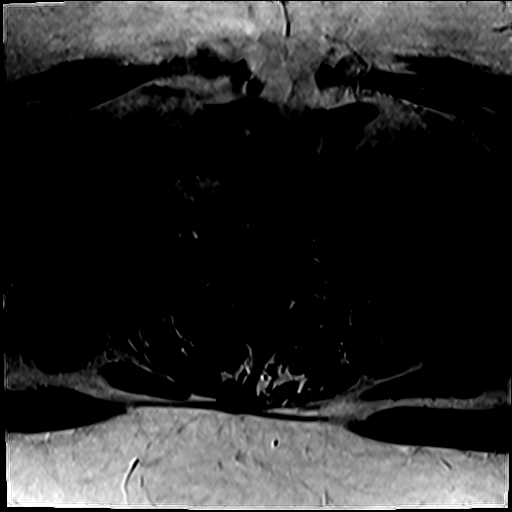

[Series 37: T1 post-contrast · axial · 1.0mm · 0.94mm/px · z∈[-35,+120]mm · 6 of 160 slices shown (2 of 4)]
[im 1/160]
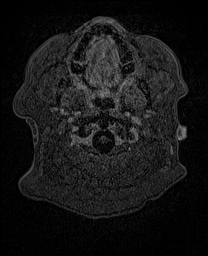
[im 32/160]
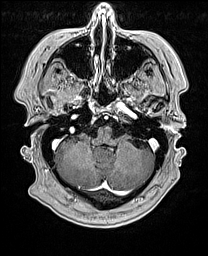
[im 64/160]
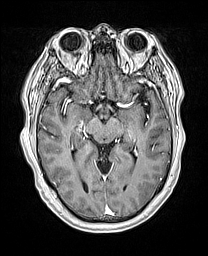
[im 96/160]
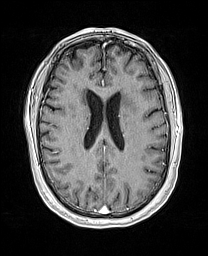
[im 128/160]
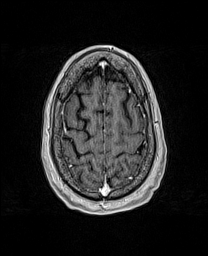
[im 160/160]
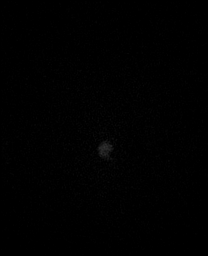

[Series 38: T1 post-contrast · coronal · 5.0mm · 0.43mm/px · 1 of 30 slices shown (3 of 4)]
[im 1/30]
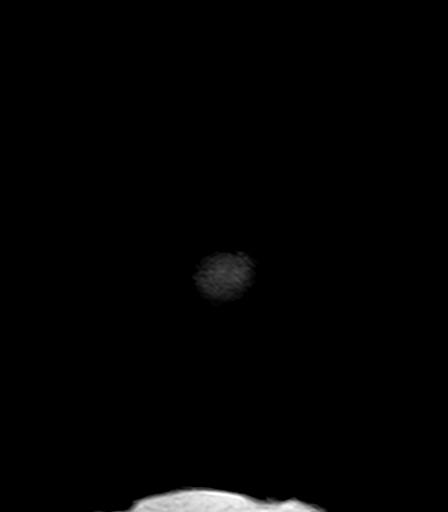

[Series 39: T1 post-contrast · sagittal · 5.0mm · 0.75mm/px · 1 of 24 slices shown (4 of 4)]
[im 1/24]
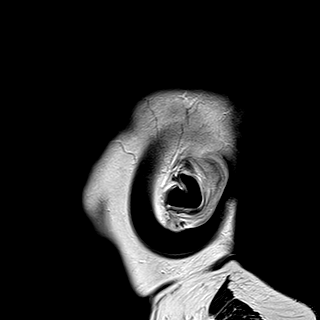

[44 of 48 positions shown; findings below may reference images not displayed]

FINDINGS: MRI HEAD FINDINGS

Brain: There is no evidence of an acute infarct, intracranial
hemorrhage, mass, midline shift, or extra-axial fluid collection.
The ventricles and sulci are within normal limits for age. Patchy T2
hyperintensities in the subcortical, deep, and periventricular
cerebral white matter bilaterally are moderately advanced for age
and have not substantially progressed allowing for differences in
technique and slice thickness/spacing between the current and prior
examinations. These are largely nonspecific in appearance although
T2 hyperintensity in the left basal ganglia and adjacent deep white
matter tracts has an appearance most suggestive of chronic ischemia.
A small T2 hyperintense focus in the right pons is unchanged. No
abnormal enhancement is identified.

Vascular: Major intracranial vascular flow voids are preserved.

Skull and upper cervical spine: Unremarkable bone marrow signal.

Sinuses/Orbits: Unremarkable orbits. Paranasal sinuses and mastoid
air cells are clear.

Other: None.

MRI CERVICAL SPINE FINDINGS

Alignment: Chronic cervical spine straightening. Trace
anterolisthesis of C6 on C7.

Vertebrae: No fracture or suspicious marrow lesion. Persistent mild
left facet edema at C3-4. Prior C4-C6 ACDF.

Cord: Chronic punctate focus of nonenhancing T2 hyperintensity in
the posterior midline of the spinal cord at C6. No definite new cord
lesions.

Posterior Fossa, vertebral arteries, paraspinal tissues:
Unremarkable.

Disc levels:

C2-3: Mild uncovertebral spurring and severe right and mild left
facet arthrosis result in mild right neural foraminal stenosis
without spinal stenosis, unchanged.

C3-4: Disc bulging, uncovertebral spurring, and severe bilateral
facet arthrosis result in moderate spinal stenosis and moderate
bilateral neural foraminal stenosis, stable to mildly progressed.

C4-5: ACDF.  No significant stenosis.

C5-6: ACDF.  No significant stenosis.

C6-7: Disc bulging, uncovertebral spurring, and moderate facet
arthrosis result in mild spinal stenosis without significant neural
foraminal stenosis, unchanged.

C7-T1: Mild disc bulging and mild right and severe left facet
arthrosis result in mild left neural foraminal stenosis without
spinal stenosis, unchanged.
IMPRESSION: 1. Unchanged nonspecific cerebral white matter disease. No evidence
of active demyelination or other acute intracranial abnormality.
2. Unchanged punctate focus of T2 hyperintensity in the spinal cord
at C6 which could reflect myelomalacia related to spondylosis or a
chronic demyelinating plaque.
3. Moderate spinal stenosis and moderate bilateral neural foraminal
stenosis at C3-4, stable to mildly progressed.
4. Unchanged mild spinal stenosis at C6-7.

## 2021-05-19 MED ORDER — GADOBUTROL 1 MMOL/ML IV SOLN
10.0000 mL | Freq: Once | INTRAVENOUS | Status: AC | PRN
  Administered 2021-05-19: 11:00:00 10 mL via INTRAVENOUS

## 2021-05-27 DIAGNOSIS — D120 Benign neoplasm of cecum: Secondary | ICD-10-CM | POA: Diagnosis not present

## 2021-05-27 DIAGNOSIS — K5730 Diverticulosis of large intestine without perforation or abscess without bleeding: Secondary | ICD-10-CM | POA: Diagnosis not present

## 2021-05-27 DIAGNOSIS — D123 Benign neoplasm of transverse colon: Secondary | ICD-10-CM | POA: Diagnosis not present

## 2021-05-27 DIAGNOSIS — Z86010 Personal history of colonic polyps: Secondary | ICD-10-CM | POA: Diagnosis not present

## 2021-06-01 DIAGNOSIS — D120 Benign neoplasm of cecum: Secondary | ICD-10-CM | POA: Diagnosis not present

## 2021-06-01 DIAGNOSIS — D123 Benign neoplasm of transverse colon: Secondary | ICD-10-CM | POA: Diagnosis not present

## 2021-07-30 DIAGNOSIS — E782 Mixed hyperlipidemia: Secondary | ICD-10-CM | POA: Diagnosis not present

## 2021-07-30 DIAGNOSIS — E038 Other specified hypothyroidism: Secondary | ICD-10-CM | POA: Diagnosis not present

## 2021-07-30 DIAGNOSIS — N182 Chronic kidney disease, stage 2 (mild): Secondary | ICD-10-CM | POA: Diagnosis not present

## 2021-07-30 DIAGNOSIS — E1165 Type 2 diabetes mellitus with hyperglycemia: Secondary | ICD-10-CM | POA: Diagnosis not present

## 2021-07-30 DIAGNOSIS — D696 Thrombocytopenia, unspecified: Secondary | ICD-10-CM | POA: Diagnosis not present

## 2021-08-03 DIAGNOSIS — Z23 Encounter for immunization: Secondary | ICD-10-CM | POA: Diagnosis not present

## 2021-08-03 DIAGNOSIS — J449 Chronic obstructive pulmonary disease, unspecified: Secondary | ICD-10-CM | POA: Diagnosis not present

## 2021-08-03 DIAGNOSIS — D696 Thrombocytopenia, unspecified: Secondary | ICD-10-CM | POA: Diagnosis not present

## 2021-08-03 DIAGNOSIS — N182 Chronic kidney disease, stage 2 (mild): Secondary | ICD-10-CM | POA: Diagnosis not present

## 2021-08-03 DIAGNOSIS — E1169 Type 2 diabetes mellitus with other specified complication: Secondary | ICD-10-CM | POA: Diagnosis not present

## 2021-08-03 DIAGNOSIS — F321 Major depressive disorder, single episode, moderate: Secondary | ICD-10-CM | POA: Diagnosis not present

## 2021-08-03 DIAGNOSIS — G35 Multiple sclerosis: Secondary | ICD-10-CM | POA: Diagnosis not present

## 2021-08-19 ENCOUNTER — Other Ambulatory Visit: Payer: Self-pay | Admitting: Neurology

## 2021-08-19 NOTE — Telephone Encounter (Signed)
Last OV was on 02/17/21.  ?Next OV is scheduled for 08/26/21.  ?Last RX was written on 07/19/21 for 30 tabs.  ? ?[place] [facility] Database has been reviewed.  ?

## 2021-08-23 ENCOUNTER — Other Ambulatory Visit: Payer: Self-pay | Admitting: Neurology

## 2021-08-25 ENCOUNTER — Other Ambulatory Visit: Payer: Self-pay | Admitting: Neurology

## 2021-08-26 ENCOUNTER — Encounter: Payer: Self-pay | Admitting: Neurology

## 2021-08-26 ENCOUNTER — Ambulatory Visit: Payer: Medicare HMO | Admitting: Neurology

## 2021-08-26 VITALS — BP 134/88 | HR 97 | Ht 64.0 in | Wt 194.0 lb

## 2021-08-26 DIAGNOSIS — G35 Multiple sclerosis: Secondary | ICD-10-CM | POA: Diagnosis not present

## 2021-08-26 DIAGNOSIS — R5383 Other fatigue: Secondary | ICD-10-CM

## 2021-08-26 DIAGNOSIS — F419 Anxiety disorder, unspecified: Secondary | ICD-10-CM

## 2021-08-26 DIAGNOSIS — D696 Thrombocytopenia, unspecified: Secondary | ICD-10-CM

## 2021-08-26 DIAGNOSIS — Z79899 Other long term (current) drug therapy: Secondary | ICD-10-CM

## 2021-08-26 DIAGNOSIS — G4733 Obstructive sleep apnea (adult) (pediatric): Secondary | ICD-10-CM

## 2021-08-26 DIAGNOSIS — F32A Depression, unspecified: Secondary | ICD-10-CM

## 2021-08-26 DIAGNOSIS — R269 Unspecified abnormalities of gait and mobility: Secondary | ICD-10-CM | POA: Diagnosis not present

## 2021-08-26 MED ORDER — TIZANIDINE HCL 2 MG PO TABS
2.0000 mg | ORAL_TABLET | Freq: Three times a day (TID) | ORAL | 11 refills | Status: DC
Start: 2021-08-26 — End: 2022-10-26

## 2021-08-26 NOTE — Progress Notes (Signed)
? ?[facility] ? ?PATIENT: Helen Sandoval ?DOB: [birth year] ? ?REFERRING DOCTOR OR PCP:  Otto Herb. (Neuro[facility])   Sinclair Ship (new PCP at [facility]) ?SOURCE: patient and records from [facility] ? ?_________________________________ ? ? ?HISTORICAL ? ?CHIEF COMPLAINT:  ?Chief Complaint  ?Patient presents with  ? Follow-up  ?  Pt alone, rm 1. Pt states that she is having body aches and bad headaches (eased up some) had a fall. She has noticed changes in balance. She has had increase falls. The oxybutin did not work ?DMT- mavenclad ?BiPAP working well. DME [employer]  ? ? ?HISTORY OF PRESENT ILLNESS:  ?Helen Sandoval is a 61 yo woman who was diagnosed with relapsing remitting MS in 2015.    ? ?Update 08/26/2021: ?She fell over a railway tie in the back yard and had some bruises.   Balance is mildly worse.   She also feels cognition and memory are worse.    We had prescribed oxybutynin initially but it did not help.  Myrbetriq has helped her bladder more.    ? ?She has completed the second year of [place] in April 2021.   Labs 03/02/2021 showed lymphocyte count = 0.5 and    Platelts have been low at +/- 100 since Mavenclad (not typical s.e.)   LFTs were fine.    She will be seeing hematology.    After the first [place], she did well but lymphocyte count was low at 0.1.  Therefore, she did Valtrex for few months.  They bounced back to 0.7 before the second year of [place].    ? ?Tremors are stable - no worse with dome fluctuation.   TSH was fine though T3U was mildly reduced   Her mom has had tremors since her 80's.   TSH had been mildly elevated earlier 2021.   She is taking B12 an Vit D.     She has osteoporosis on recent Bone density.    ? ?Her headaches are better.   They are located in the back of the head when present.    ? ?She is on BiPAP 13/8 (pressures will reduce) and has excellent compliance on the d/l though AHI was mildly high at 5.9 and had 90% compliance.       She has  lost weight after being diagnosed  with NIDDM type 2 .  She is now on metformin.  Her HgbA1c is reportedly better ? ?Gait and strength are fine.   Sensation is doing the same with mild leg dysesthesia. .  She just takes gabapentin bid now for the leg pain (tid made sleepy).  Vision is unchanged.   Bladder function is good.     ? ?She is noting a little less fatigue with weight loss but not much different now.    Her 34 yo mother (has had colons urgency and bladder tumors recently) lives with her and she is doing all her chores.  She is sleeping worse but gets 6 hours most nights.  She has an occasioanl day she sleep  ? ?Colonoscopy showed polyps ad she will need another one early next year.     ? ? ?MS History:    ?In 2008, she had headaches and poor balance and was told the MRI had some white matter foci at that time.    She had spinal stenosis and needed surgery in 2014.  The surgeon felt her gait and other issues were decreased out of proportion to the extent of spine disease  and referred her to Neurology.   Besides gait issues, she also had double vision, blurry vision, cognitive difficulty and incontinence in early 2015. She saw Dr. Pamalee Leyden in [place]. An MRI of the brain, CSF and visual evoked potentials were consistent with multiple sclerosis.    Initially, she was placed on Tecfidera. Due to progression with more neurologic symptoms, she was switched to Tysabri.   Her last MRI was done 10/2014 but we do not have the images.   ? ?Imaging: ?MRI brain 05/14/2020 shows T2/FLAIR hyperintense foci in the hemispheres, pons and left basal ganglia.  Most of the foci are nonspecific.  A few are periventricular and radially oriented.  The focus in the left basal ganglia could also represent ischemic sequela.    None of the foci appear to be acute and they do not enhance.    There are no new lesions compared with 05/12/2018 MRI. ? ?MRI of the cervical spine shows 1 or 2 small subtle foci within the spinal cord adjacent  to C4-C5 and C6.  These have been seen on previous MRIs.  Though nonspecific, this is consistent with a chronic demyelinating plaque.  Minimal chronic compressive myelopathic signal cannot be ruled out.  There are no new lesions and no enhancing lesions.   Prior C4-C6 ACDF.  There is no nerve root compression or spinal stenosis at these levels.   Mild spinal stenosis and mild foraminal narrowing at C3-C4 and C6-C7 that does not lead to any nerve root compression.  Degenerative changes are stable compared to the 2020 MRI. ? ?MRI of the brain 05/19/2021 showed no new lesions.  MRI of the cervical spine 05/19/2021 showed no new lesions. ? ? ?REVIEW OF SYSTEMS: ?Constitutional: No fevers, chills, sweats, or change in appetite.   She notes fatigue ?Eyes: Reports visual changes and double vision.  No eye pain ?Ear, nose and throat: No hearing loss, ear pain, nasal congestion, sore throat ?Cardiovascular: No chest pain, palpitations ?Respiratory:  No shortness of breath at rest or with exertion.   She has snoring and coughing but no wheezes ?GastrointestinaI: No nausea, vomiting, abdominal pain.  She notes diarrhea and some fecal incontinence ?Genitourinary:  No dysuria, urinary retention or frequency. She has had some incontinence ?Musculoskeletal:  No neck pain, back pain ?Integumentary: No rash, pruritus, skin lesions ?Neurological: as above ?Psychiatric: Some depression at this time.  Some anxiety ?Endocrine: No palpitations, diaphoresis, change in appetite, change in weigh or increased thirst ?Hematologic/Lymphatic:  No anemia, purpura, petechiae. ?Allergic/Immunologic: No itchy/runny eyes, nasal congestion, recent allergic reactions, rashes ? ?ALLERGIES: ?Allergies  ?Allergen Reactions  ? Clindamycin Anaphylaxis  ? Indomethacin Hives  ? Levofloxacin Hives  ? Penicillins Anaphylaxis  ? Penicillins Cross Reactors Anaphylaxis  ? Sulfa Antibiotics Swelling  ?  Blister (steven john syndrome)  ? Levaquin [Levofloxacin  Hemihydrate] Hives  ? Lipitor  [Atorvastatin Calcium]   ? Multihance [Gadobenate] Hives  ?  Pt having hives/ rash on neck and chest   ? Sulfamethoxazole-Trimethoprim   ? Zocor [Simvastatin] Other (See Comments)  ?  Other reaction(s): MUSCLE PAIN ?Muscle aches  ? ? ?HOME MEDICATIONS: ? ?Current Outpatient Medications:  ?  alendronate (FOSAMAX) 70 MG tablet, Take 70 mg by mouth once a week. Take with a full glass of water on an empty stomach., Disp: , Rfl:  ?  aspirin EC 81 MG tablet, Take 81 mg by mouth every other day., Disp: , Rfl:  ?  Biotin 10000 MCG TABS, Take by mouth daily. ,  Disp: , Rfl:  ?  buPROPion (WELLBUTRIN SR) 150 MG 12 hr tablet, Take 150 mg by mouth 2 (two) times daily. , Disp: , Rfl:  ?  busPIRone (BUSPAR) 15 MG tablet, Take 1 tablet (15 mg total) by mouth 2 (two) times daily., Disp: 180 tablet, Rfl: 3 ?  cholecalciferol (VITAMIN D) 1000 UNITS tablet, Take 5,000 Units by mouth daily., Disp: , Rfl:  ?  Cladribine (MAVENCLAD, 9 TABS, PO), Take by mouth., Disp: , Rfl:  ?  estradiol (CLIMARA - DOSED IN MG/24 HR) 0.0375 mg/24hr patch, estradiol 0.0375 mg/24 hr weekly transdermal patch, Disp: , Rfl:  ?  ezetimibe (ZETIA) 10 MG tablet, Take 10 mg by mouth daily. , Disp: , Rfl:  ?  gabapentin (NEURONTIN) 300 MG capsule, Take 1 capsule (300 mg total) by mouth 3 (three) times daily., Disp: 270 capsule, Rfl: 3 ?  IRON-FOLIC ACID-VIT D66 PO, Take by mouth., Disp: , Rfl:  ?  Krill Oil 1000 MG CAPS, Take 1,000 mg by mouth daily. , Disp: , Rfl:  ?  metFORMIN (GLUCOPHAGE-XR) 500 MG 24 hr tablet, Take 1 tablet by mouth daily., Disp: , Rfl:  ?  MILK THISTLE PO, Take by mouth., Disp: , Rfl:  ?  Multiple Vitamins-Minerals (ZINC PO), Take 50 mg by mouth daily., Disp: , Rfl:  ?  omeprazole (PRILOSEC) 20 MG capsule, Take 20 mg by mouth daily. , Disp: , Rfl:  ?  OVER THE COUNTER MEDICATION, Liver Detox, Disp: , Rfl:  ?  phentermine 37.5 MG capsule, Take 1 capsule by mouth in the morning, Disp: 30 capsule, Rfl: 0 ?   predniSONE (DELTASONE) 50 MG tablet, Take 1 tablet 13 hours, 7 hours and 1 hour (with benadryl) before MRI scan., Disp: 3 tablet, Rfl: 0 ?  PSYLLIUM HUSK PO, Take by mouth., Disp: , Rfl:  ?  selenium 50 MCG TABS t

## 2021-08-27 LAB — CBC WITH DIFFERENTIAL/PLATELET
Basophils Absolute: 0 10*3/uL (ref 0.0–0.2)
Basos: 0 %
EOS (ABSOLUTE): 0.1 10*3/uL (ref 0.0–0.4)
Eos: 1 %
Hematocrit: 38.2 % (ref 34.0–46.6)
Hemoglobin: 12.6 g/dL (ref 11.1–15.9)
Immature Grans (Abs): 0 10*3/uL (ref 0.0–0.1)
Immature Granulocytes: 1 %
Lymphocytes Absolute: 0.6 10*3/uL — ABNORMAL LOW (ref 0.7–3.1)
Lymphs: 14 %
MCH: 31.8 pg (ref 26.6–33.0)
MCHC: 33 g/dL (ref 31.5–35.7)
MCV: 97 fL (ref 79–97)
Monocytes Absolute: 0.4 10*3/uL (ref 0.1–0.9)
Monocytes: 8 %
Neutrophils Absolute: 3.5 10*3/uL (ref 1.4–7.0)
Neutrophils: 76 %
Platelets: 101 10*3/uL — ABNORMAL LOW (ref 150–450)
RBC: 3.96 x10E6/uL (ref 3.77–5.28)
RDW: 13.5 % (ref 11.7–15.4)
WBC: 4.6 10*3/uL (ref 3.4–10.8)

## 2021-08-27 LAB — COMPREHENSIVE METABOLIC PANEL
ALT: 38 IU/L — ABNORMAL HIGH (ref 0–32)
AST: 27 IU/L (ref 0–40)
Albumin/Globulin Ratio: 3.2 — ABNORMAL HIGH (ref 1.2–2.2)
Albumin: 4.8 g/dL (ref 3.8–4.8)
Alkaline Phosphatase: 77 IU/L (ref 44–121)
BUN/Creatinine Ratio: 23 (ref 12–28)
BUN: 19 mg/dL (ref 8–27)
Bilirubin Total: 0.3 mg/dL (ref 0.0–1.2)
CO2: 26 mmol/L (ref 20–29)
Calcium: 9.4 mg/dL (ref 8.7–10.3)
Chloride: 102 mmol/L (ref 96–106)
Creatinine, Ser: 0.84 mg/dL (ref 0.57–1.00)
Globulin, Total: 1.5 g/dL (ref 1.5–4.5)
Glucose: 90 mg/dL (ref 70–99)
Potassium: 4.4 mmol/L (ref 3.5–5.2)
Sodium: 143 mmol/L (ref 134–144)
Total Protein: 6.3 g/dL (ref 6.0–8.5)
eGFR: 78 mL/min/{1.73_m2} (ref >59)

## 2021-08-28 ENCOUNTER — Other Ambulatory Visit: Payer: Self-pay | Admitting: Neurology

## 2021-08-30 ENCOUNTER — Encounter: Payer: Self-pay | Admitting: Neurology

## 2021-08-30 ENCOUNTER — Other Ambulatory Visit: Payer: Self-pay | Admitting: Physician Assistant

## 2021-08-30 DIAGNOSIS — D696 Thrombocytopenia, unspecified: Secondary | ICD-10-CM

## 2021-08-31 ENCOUNTER — Other Ambulatory Visit: Payer: Self-pay

## 2021-08-31 ENCOUNTER — Inpatient Hospital Stay: Payer: Medicare HMO | Attending: Physician Assistant | Admitting: Physician Assistant

## 2021-08-31 ENCOUNTER — Inpatient Hospital Stay: Payer: Medicare HMO

## 2021-08-31 VITALS — BP 130/86 | HR 88 | Temp 97.8°F | Resp 16 | Ht 64.0 in | Wt 195.6 lb

## 2021-08-31 DIAGNOSIS — Z79899 Other long term (current) drug therapy: Secondary | ICD-10-CM | POA: Insufficient documentation

## 2021-08-31 DIAGNOSIS — G35 Multiple sclerosis: Secondary | ICD-10-CM | POA: Diagnosis not present

## 2021-08-31 DIAGNOSIS — D696 Thrombocytopenia, unspecified: Secondary | ICD-10-CM

## 2021-08-31 DIAGNOSIS — R161 Splenomegaly, not elsewhere classified: Secondary | ICD-10-CM | POA: Insufficient documentation

## 2021-08-31 LAB — CBC WITH DIFFERENTIAL (CANCER CENTER ONLY)
Abs Immature Granulocytes: 0.02 10*3/uL (ref 0.00–0.07)
Basophils Absolute: 0 10*3/uL (ref 0.0–0.1)
Basophils Relative: 0 %
Eosinophils Absolute: 0 10*3/uL (ref 0.0–0.5)
Eosinophils Relative: 1 %
HCT: 35.3 % — ABNORMAL LOW (ref 36.0–46.0)
Hemoglobin: 12 g/dL (ref 12.0–15.0)
Immature Granulocytes: 0 %
Lymphocytes Relative: 10 %
Lymphs Abs: 0.6 10*3/uL — ABNORMAL LOW (ref 0.7–4.0)
MCH: 32.2 pg (ref 26.0–34.0)
MCHC: 34 g/dL (ref 30.0–36.0)
MCV: 94.6 fL (ref 80.0–100.0)
Monocytes Absolute: 0.4 10*3/uL (ref 0.1–1.0)
Monocytes Relative: 7 %
Neutro Abs: 4.7 10*3/uL (ref 1.7–7.7)
Neutrophils Relative %: 82 %
Platelet Count: 105 10*3/uL — ABNORMAL LOW (ref 150–400)
RBC: 3.73 MIL/uL — ABNORMAL LOW (ref 3.87–5.11)
RDW: 13.2 % (ref 11.5–15.5)
WBC Count: 5.7 10*3/uL (ref 4.0–10.5)
nRBC: 0 % (ref 0.0–0.2)

## 2021-08-31 NOTE — Progress Notes (Signed)
?[place] ?[phone]   [phone] ? ?HEMATOLOGY AND ONCOLOGY PROGRESSNOTE ? ?Patient Care Team: ?Merrilee Seashore, MD as PCP - General (Internal Medicine) ? ?Hematological/Oncological History ?1) Labs since starting Cladrib[facility]) for multiple sclerosis in February 2020:  ?08/29/2018: WBC 5.2, Hgb 13.1, Plt 135K, Lymphocyte 0.2 ?01/03/2019: WBC 4.5, Hgb 13.8, Plt 147K, Lymphocyte 0.3 ?06/05/2019: WBC 5.9, Hgb 13.7, Plt 145K, Lymphocyte 0.7 ?10/07/2019: WBC 4.0, Hgb 13.1, Plt 102K, Lymphocyte 0.1 ?02/12/2020: WBC 4.1, Hgb 12.6, Plt 89K, Lymphocyte 0.3 ?02/27/2020: WBC 4.0, Hgb 12.6, Plt 91K, Lymphocyte 0.3 ?03/31/2020: WBC 4.7, Hgb 13.0, Plt 91K, Lymphocyte 0.3 ?05/06/2020: WBC 4.6, Hgb 13.1, Plt 98K, Lymphocyte 0.4 ?08/12/2020: WBC 4.5, Hgb 12.0, Pkt 78K, Lymphocyte 0.4 ? ?2) 08/17/2020: Establish care with Dr. Lorenso Courier and Dede Query PA-C ? ?CHIEF COMPLAINT: Thrombocytopenia  ? ?HISTORY OF PRESENTING ILLNESS:  ?Helen Sandoval 61 y.o. female returns for a follow up for thrombocytopenia. Patient is unaccompanied for this visit. Her last visit was on 03/02/2021. She reports no major changes to her health in the interim.  ?  ?On exam today, Ms. Moskowitz reports stable energy levels.  She does continue to have fatigue but completes all her daily activities on her own.  Patient's appetite is unchanged without any noticeable weight loss.  She denies any nausea, vomiting or abdominal pain.  Her bowel habits are regular without any diarrhea or constipation.  She denies fevers, chills, night sweats, shortness of breath, chest pain or cough.  She has no other complaints.  Rest of the 10 point ROS is below. ? ?MEDICAL HISTORY:  ?Past Medical History:  ?Diagnosis Date  ? Ankle fracture   ? Diabetes ([place])   ? Fatty liver   ? Osteopenia   ? Scarlet fever   ? Shingles   ? Sleep apnea   ? Spina bifida   ? Splenomegaly   ? Thrombocytopenia ([place])   ? Vitamin D deficiency   ? ? ?SURGICAL  HISTORY: ?Past Surgical History:  ?Procedure Laterality Date  ? ABDOMINAL HYSTERECTOMY    ? fibroids and heavy bleeding  ? BREAST BIOPSY Right   ? 20+ yrs ago  ? CERVICAL FUSION    ? ? ?SOCIAL HISTORY: ?Social History  ? ?Socioeconomic History  ? Marital status: Divorced  ?  Spouse name: Not on file  ? Number of children: Not on file  ? Years of education: Not on file  ? Highest education level: Not on file  ?Occupational History  ? Not on file  ?Tobacco Use  ? Smoking status: Some Days  ? Smokeless tobacco: Never  ? Tobacco comments:  ?  3 cigarrettes per day.   ?Substance and Sexual Activity  ? Alcohol use: Yes  ?  Comment: occasional use. a few times a year.   ? Drug use: No  ? Sexual activity: Not on file  ?Other Topics Concern  ? Not on file  ?Social History Narrative  ? Not on file  ? ?Social Determinants of Health  ? ?Financial Resource Strain: Not on file  ?Food Insecurity: Not on file  ?Transportation Needs: Not on file  ?Physical Activity: Not on file  ?Stress: Not on file  ?Social Connections: Not on file  ?Intimate Partner Violence: Not on file  ? ? ?FAMILY HISTORY: ?Family History  ?Problem Relation Age of Onset  ? Bladder Cancer Mother   ? Colon cancer Mother   ? Colon cancer Father   ?     diagnosed less than  age of 60.   ? Bladder Cancer Maternal Grandmother   ? Leukemia Maternal Grandmother   ? ? ?ALLERGIES:  is allergic to clindamycin, indomethacin, levofloxacin, penicillins, penicillins cross reactors, sulfa antibiotics, levaquin [levofloxacin hemihydrate], lipitor  [atorvastatin calcium], multihance [gadobenate], sulfamethoxazole-trimethoprim, and zocor [simvastatin]. ? ?MEDICATIONS:  ?Current Outpatient Medications  ?Medication Sig Dispense Refill  ? alendronate (FOSAMAX) 70 MG tablet Take 70 mg by mouth once a week. Take with a full glass of water on an empty stomach.    ? aspirin EC 81 MG tablet Take 81 mg by mouth every other day.    ? Biotin 10000 MCG TABS Take by mouth daily.     ?  buPROPion (WELLBUTRIN SR) 150 MG 12 hr tablet Take 150 mg by mouth 2 (two) times daily.     ? busPIRone (BUSPAR) 15 MG tablet Take 1 tablet (15 mg total) by mouth 2 (two) times daily. 180 tablet 3  ? cholecalciferol (VITAMIN D) 1000 UNITS tablet Take 5,000 Units by mouth daily.    ? Cladribine (MAVENCLAD, 9 TABS, PO) Take by mouth.    ? estradiol (CLIMARA - DOSED IN MG/24 HR) 0.0375 mg/24hr patch estradiol 0.0375 mg/24 hr weekly transdermal patch    ? ezetimibe (ZETIA) 10 MG tablet Take 10 mg by mouth daily.     ? gabapentin (NEURONTIN) 300 MG capsule Take 1 capsule (300 mg total) by mouth 3 (three) times daily. 270 capsule 3  ? IRON-FOLIC ACID-VIT D32 PO Take by mouth daily.    ? Krill Oil 1000 MG CAPS Take 1,000 mg by mouth daily.     ? metFORMIN (GLUCOPHAGE-XR) 500 MG 24 hr tablet Take 2 tablets by mouth at bedtime. At supper    ? MILK THISTLE PO Take by mouth.    ? Multiple Vitamins-Minerals (ZINC PO) Take 50 mg by mouth daily.    ? MYRBETRIQ 50 MG TB24 tablet Take 1 tablet by mouth once daily 90 tablet 3  ? omeprazole (PRILOSEC) 20 MG capsule Take 20 mg by mouth daily.     ? OVER THE COUNTER MEDICATION Liver Detox    ? phentermine 37.5 MG capsule Take 1 capsule by mouth in the morning 30 capsule 0  ? predniSONE (DELTASONE) 50 MG tablet Take 1 tablet 13 hours, 7 hours and 1 hour (with benadryl) before MRI scan. 3 tablet 0  ? PSYLLIUM HUSK PO Take by mouth.    ? selenium 50 MCG TABS tablet Take 50 mcg by mouth daily.    ? tiZANidine (ZANAFLEX) 2 MG tablet Take 1 tablet (2 mg total) by mouth 3 (three) times daily. 90 tablet 11  ? traZODone (DESYREL) 100 MG tablet Take 1 tablet (100 mg total) by mouth at bedtime. 90 tablet 3  ? TURMERIC PO Take 1 capsule by mouth every morning. +[place] '600mg'$ /'5mg'$     ? venlafaxine XR (EFFEXOR-XR) 150 MG 24 hr capsule Take 2 capsules by mouth daily.    ? ?No current facility-administered medications for this visit.  ? ? ?REVIEW OF SYSTEMS:   ?Constitutional: ( - ) fevers, ( - )   chills , ( - ) night sweats ?Eyes: ( - ) blurriness of vision, ( - ) double vision, ( - ) watery eyes ?Ears, nose, mouth, throat, and face: ( - ) mucositis, ( - ) sore throat ?Respiratory: ( - ) cough, ( - ) dyspnea, ( - ) wheezes ?Cardiovascular: ( - ) palpitation, ( - ) chest discomfort, ( - ) lower extremity swelling ?  Gastrointestinal:  ( - ) nausea, ( - ) heartburn, ( - ) change in bowel habits ?Skin: ( - ) abnormal skin rashes ?Lymphatics: ( - ) new lymphadenopathy, ( - ) easy bruising ?Neurological: ( - ) numbness, ( - ) tingling, ( - ) new weaknesses ?Behavioral/Psych: ( - ) mood change, ( - ) new changes  ?All other systems were reviewed with the patient and are negative. ? ?PHYSICAL EXAMINATION: ?ECOG PERFORMANCE STATUS: 0 - Asymptomatic ? ?Vitals:  ? 08/31/21 1503  ?BP: 130/86  ?Pulse: 88  ?Resp: 16  ?Temp: 97.8 ?F (36.6 ?C)  ?SpO2: 97%  ? ?Filed Weights  ? 08/31/21 1503  ?Weight: 195 lb 9.6 oz (88.7 kg)  ? ? ?GENERAL: well appearing female in NAD  ?SKIN: skin color, texture, turgor are normal, or significant lesions. ?EYES: conjunctiva are pink and non-injected, sclera clear ?LUNGS: clear to auscultation and percussion with normal breathing effort ?HEART: regular rate & rhythm and no murmurs and no lower extremity edema ?Musculoskeletal: no cyanosis of digits and no clubbing  ?PSYCH: alert & oriented x 3, fluent speech ?NEURO: no focal motor/sensory deficits ? ?LABORATORY DATA:  ?I have reviewed the data as listed ? ?  Latest Ref Rng & Units 08/31/2021  ?  2:53 PM 08/26/2021  ?  1:38 PM 03/02/2021  ?  1:18 PM  ?CBC  ?WBC 4.0 - 10.5 K/uL 5.7   4.6   4.4    ?Hemoglobin 12.0 - 15.0 g/dL 12.0   12.6   11.9    ?Hematocrit 36.0 - 46.0 % 35.3   38.2   35.1    ?Platelets 150 - 400 K/uL 105   101   103    ? ? ? ?  Latest Ref Rng & Units 08/26/2021  ?  1:38 PM 03/02/2021  ?  1:18 PM 02/17/2021  ?  1:50 PM  ?CMP  ?Glucose 70 - 99 mg/dL 90   154     ?BUN 8 - 27 mg/dL 19   17     ?Creatinine 0.57 - 1.00 mg/dL 0.84   1.02      ?Sodium 134 - 144 mmol/L 143   138     ?Potassium 3.5 - 5.2 mmol/L 4.4   4.0     ?Chloride 96 - 106 mmol/L 102   104     ?CO2 20 - 29 mmol/L 26   24     ?Calcium 8.7 - 10.3 mg/dL 9.4   9.7     ?Total Protein 6.0

## 2021-09-16 ENCOUNTER — Other Ambulatory Visit: Payer: Self-pay | Admitting: Neurology

## 2021-09-16 NOTE — Telephone Encounter (Signed)
Last OV was on 08/26/21.  ?Next OV is scheduled for 03/17/22.  ?Last RX was written on 08/19/21 for 30 tabs.  ? ?[place] [facility] Database has been reviewed.  ?

## 2021-09-17 ENCOUNTER — Other Ambulatory Visit: Payer: Self-pay | Admitting: Neurology

## 2021-10-18 ENCOUNTER — Other Ambulatory Visit: Payer: Self-pay | Admitting: Neurology

## 2021-10-19 NOTE — Telephone Encounter (Signed)
Last OV was on 08/26/21.  Next OV is scheduled for 03/17/22.  Last RX was written on 09/17/21 for 30 tabs.   [place] Drug Database has been reviewed.

## 2021-11-15 ENCOUNTER — Other Ambulatory Visit: Payer: Self-pay | Admitting: Neurology

## 2021-12-12 ENCOUNTER — Other Ambulatory Visit: Payer: Self-pay | Admitting: Neurology

## 2021-12-13 NOTE — Telephone Encounter (Signed)
Last OV was on 08/26/21.  Next OV is scheduled for 03/17/22.  Last RX was written on 11/16/21 for 30 tabs.   [place] Drug Database has been reviewed.

## 2021-12-27 DIAGNOSIS — S61212A Laceration without foreign body of right middle finger without damage to nail, initial encounter: Secondary | ICD-10-CM | POA: Diagnosis not present

## 2022-01-07 ENCOUNTER — Ambulatory Visit: Payer: Self-pay

## 2022-01-07 NOTE — Patient Outreach (Signed)
  Care Coordination   01/07/2022 Name: Helen Sandoval MRN: [id number] DOB: [birth year]   Care Coordination Outreach Attempts:  An unsuccessful telephone outreach was attempted today to offer the patient information about available care coordination services as a benefit of their health plan.   Follow Up Plan:  Additional outreach attempts will be made to offer the patient care coordination information and services.   Encounter Outcome:  No Answer  Care Coordination Interventions Activated:  No   Care Coordination Interventions:  No, not indicated    Lazaro Arms RN, BSN, [facility]   Phone: [phone]

## 2022-01-07 NOTE — Progress Notes (Signed)
This encounter was created in error - please disregard.

## 2022-01-11 ENCOUNTER — Other Ambulatory Visit: Payer: Self-pay | Admitting: Neurology

## 2022-01-11 ENCOUNTER — Ambulatory Visit: Payer: Self-pay

## 2022-01-12 NOTE — Patient Outreach (Signed)
  Care Coordination   Initial Visit Note   01/12/2022 Name: Helen Sandoval MRN: [id number] DOB: 07-07-1960  Helen Sandoval is a 61 y.o. year old female who sees Helen Seashore, MD for primary care. I spoke with  Helen Sandoval by phone today  What matters to the patients health and wellness today?  I want information about MS.    Goals Addressed             This Visit's Progress    I wiould like inofrmation about MS       Care Coordination Interventions: Reviewed medications with patient and discussed  I spoke with Helen Sandoval who reported good health, but requested educational materials on multiple sclerosis. We discussed fall safety,because she mentioned experiencing falls previosuly but none lately. Helen Sandoval is up-to-date on her health maintenance, with the exception of needing a colonoscopy in two years. She requested a follow-up call in three months. Active listening / Reflection utilized  Emotional Support Provided Problem [place] strategies reviewed         SDOH assessments and interventions completed:  Yes  SDOH Interventions Today    Flowsheet Row Most Recent Value  SDOH Interventions   Food Insecurity Interventions Intervention Not Indicated  Housing Interventions Intervention Not Indicated  Transportation Interventions Intervention Not Indicated        Care Coordination Interventions Activated:  Yes  Care Coordination Interventions:  Yes, provided   Follow up plan: Follow up call scheduled for 11/15 2 pm    Encounter Outcome:  Pt. Scheduled   Lazaro Arms RN, BSN, [facility]   Phone: [phone]

## 2022-01-12 NOTE — Patient Instructions (Signed)
Visit Information  Thank you for taking time to visit with me today. Please don't hesitate to contact me if I can be of assistance to you.   Following are the goals we discussed today:   Goals Addressed             This Visit's Progress    I wiould like inofrmation about MS       Care Coordination Interventions: Reviewed medications with patient and discussed  I spoke with Mrs. Suk who reported good health, but requested educational materials on multiple sclerosis. We discussed fall safety,because she mentioned experiencing falls previosuly but none lately. Mrs. Shirkey is up-to-date on her health maintenance, with the exception of needing a colonoscopy in two years. She requested a follow-up call in three months. Active listening / Reflection utilized  Emotional Support Provided Problem [place] strategies reviewed         Our next appointment is by telephone on 11/15 at 2 pm  Please call the care guide team at [phone] if you need to cancel or reschedule your appointment.   If you are experiencing a Mental Health or [place] or need someone to talk to, please call 1-800-273-TALK (toll free, 24 hour hotline)  Patient verbalizes understanding of instructions and care plan provided today and agrees to view in [place]. Active MyChart status and patient understanding of how to access instructions and care plan via MyChart confirmed with patient.     Lazaro Arms RN, BSN, [facility]   Phone: [phone]

## 2022-01-12 NOTE — Telephone Encounter (Signed)
Last OV was on 08/26/21.  Next OV is scheduled for 03/17/22.  Last RX was written on 12/13/21 for 30 tabs.   [place] Drug Database has been reviewed.

## 2022-02-01 ENCOUNTER — Other Ambulatory Visit: Payer: Self-pay | Admitting: Obstetrics & Gynecology

## 2022-02-01 DIAGNOSIS — Z1231 Encounter for screening mammogram for malignant neoplasm of breast: Secondary | ICD-10-CM

## 2022-02-14 DIAGNOSIS — R059 Cough, unspecified: Secondary | ICD-10-CM | POA: Diagnosis not present

## 2022-02-14 DIAGNOSIS — R5383 Other fatigue: Secondary | ICD-10-CM | POA: Diagnosis not present

## 2022-02-14 DIAGNOSIS — Z20822 Contact with and (suspected) exposure to covid-19: Secondary | ICD-10-CM | POA: Diagnosis not present

## 2022-02-14 DIAGNOSIS — R439 Unspecified disturbances of smell and taste: Secondary | ICD-10-CM | POA: Diagnosis not present

## 2022-02-15 DIAGNOSIS — R059 Cough, unspecified: Secondary | ICD-10-CM | POA: Diagnosis not present

## 2022-02-15 DIAGNOSIS — R5383 Other fatigue: Secondary | ICD-10-CM | POA: Diagnosis not present

## 2022-02-15 DIAGNOSIS — Z20822 Contact with and (suspected) exposure to covid-19: Secondary | ICD-10-CM | POA: Diagnosis not present

## 2022-02-15 DIAGNOSIS — R439 Unspecified disturbances of smell and taste: Secondary | ICD-10-CM | POA: Diagnosis not present

## 2022-02-17 ENCOUNTER — Other Ambulatory Visit: Payer: Self-pay | Admitting: Neurology

## 2022-02-17 NOTE — Telephone Encounter (Signed)
Pt has an up coming appt and has been checked and the registry.

## 2022-02-28 ENCOUNTER — Other Ambulatory Visit: Payer: Self-pay

## 2022-02-28 MED ORDER — BUSPIRONE HCL 15 MG PO TABS: 15.0000 mg | ORAL_TABLET | Freq: Two times a day (BID) | ORAL | 3 refills | Status: DC

## 2022-03-03 ENCOUNTER — Encounter: Payer: Self-pay | Admitting: Neurology

## 2022-03-03 NOTE — Telephone Encounter (Signed)
PA submitted on [employer] for Myrbetriq 50 mg Er tablets. States that it is available without authorization

## 2022-03-08 DIAGNOSIS — Z1231 Encounter for screening mammogram for malignant neoplasm of breast: Secondary | ICD-10-CM

## 2022-03-14 ENCOUNTER — Other Ambulatory Visit: Payer: Self-pay | Admitting: Neurology

## 2022-03-15 DIAGNOSIS — E1169 Type 2 diabetes mellitus with other specified complication: Secondary | ICD-10-CM | POA: Diagnosis not present

## 2022-03-15 DIAGNOSIS — E039 Hypothyroidism, unspecified: Secondary | ICD-10-CM | POA: Diagnosis not present

## 2022-03-15 DIAGNOSIS — E782 Mixed hyperlipidemia: Secondary | ICD-10-CM | POA: Diagnosis not present

## 2022-03-15 DIAGNOSIS — E038 Other specified hypothyroidism: Secondary | ICD-10-CM | POA: Diagnosis not present

## 2022-03-15 DIAGNOSIS — D696 Thrombocytopenia, unspecified: Secondary | ICD-10-CM | POA: Diagnosis not present

## 2022-03-15 DIAGNOSIS — N182 Chronic kidney disease, stage 2 (mild): Secondary | ICD-10-CM | POA: Diagnosis not present

## 2022-03-15 DIAGNOSIS — G35 Multiple sclerosis: Secondary | ICD-10-CM | POA: Diagnosis not present

## 2022-03-17 ENCOUNTER — Telehealth: Payer: Self-pay | Admitting: Neurology

## 2022-03-17 ENCOUNTER — Encounter: Payer: Self-pay | Admitting: Neurology

## 2022-03-17 ENCOUNTER — Ambulatory Visit: Payer: Medicare HMO | Admitting: Neurology

## 2022-03-17 ENCOUNTER — Other Ambulatory Visit: Payer: Self-pay | Admitting: Physician Assistant

## 2022-03-17 VITALS — BP 151/99 | HR 102 | Ht 64.0 in | Wt 198.0 lb

## 2022-03-17 DIAGNOSIS — R5383 Other fatigue: Secondary | ICD-10-CM | POA: Diagnosis not present

## 2022-03-17 DIAGNOSIS — F419 Anxiety disorder, unspecified: Secondary | ICD-10-CM

## 2022-03-17 DIAGNOSIS — G35 Multiple sclerosis: Secondary | ICD-10-CM

## 2022-03-17 DIAGNOSIS — Z79899 Other long term (current) drug therapy: Secondary | ICD-10-CM

## 2022-03-17 DIAGNOSIS — G4733 Obstructive sleep apnea (adult) (pediatric): Secondary | ICD-10-CM

## 2022-03-17 DIAGNOSIS — F32A Depression, unspecified: Secondary | ICD-10-CM

## 2022-03-17 DIAGNOSIS — D696 Thrombocytopenia, unspecified: Secondary | ICD-10-CM

## 2022-03-17 DIAGNOSIS — R269 Unspecified abnormalities of gait and mobility: Secondary | ICD-10-CM

## 2022-03-17 DIAGNOSIS — K760 Fatty (change of) liver, not elsewhere classified: Secondary | ICD-10-CM | POA: Diagnosis not present

## 2022-03-17 DIAGNOSIS — G8929 Other chronic pain: Secondary | ICD-10-CM

## 2022-03-17 DIAGNOSIS — M545 Low back pain, unspecified: Secondary | ICD-10-CM | POA: Diagnosis not present

## 2022-03-17 MED ORDER — ALPRAZOLAM 0.5 MG PO TABS: 0.5000 mg | ORAL_TABLET | Freq: Every evening | ORAL | 5 refills | Status: DC | PRN

## 2022-03-17 NOTE — Telephone Encounter (Signed)
Craig [employer]: [id number] exp. 03/17/22-04/16/22 sent to GI [phone]

## 2022-03-17 NOTE — Progress Notes (Signed)
[facility]  PATIENT: Helen Sandoval DOB: [birth year]  REFERRING DOCTOR OR PCP:  Otto Herb. (Neuro[facility])   Sinclair Ship (new PCP at [facility]) SOURCE: patient and records from [facility]  _________________________________   HISTORICAL  CHIEF COMPLAINT:  Chief Complaint  Patient presents with   Follow-up    RM 1, alone. MS DMT: [place]. Tolerating CPAP well.    HISTORY OF PRESENT ILLNESS:  Helen Sandoval is a 61 yo woman who was diagnosed with relapsing remitting MS in 2015.     Update 03/17/2022 She completed the second year of [place] in April 2021.   Labs 08/31/2021 showed lymphocyte count = 0.6 (has been 0.5 x 0.6 times several years) and  Platelts have been low at +/- 100 since [place] (not typical s.e.)   LFTs were okay with minimally elevated ALT.     After the first [place], she did well but lymphocyte count was low at 0.1.  Therefore, she did Valtrex for few months.  They bounced back to 0.7 before the second year of [place].     Due to the persistent lymphopenia and low platelet count, she was referred to hematology.   No counts were found to be due to cattleman and/or NAFLD with mild splenomegaly if counts worsened bone marrow biopsy would be considered.   Gait and strength are fine.   Sensation is doing the same with mild leg dysesthesia. .  She just takes gabapentin bid now for the leg pain (tid made sleepy).  Vision is unchanged.   Bladder function is good.     Balance is mildly worse.   She also feels cognition and memory are worse.    We had prescribed oxybutynin initially but it did not help.  Myrbetriq has helped her bladder more.     Tremors are stable - no worse with dome fluctuation.   TSH was fine though T3U was mildly reduced   Her mom has had tremors since her 80's.   TSH had been mildly elevated earlier 2021.   She is taking B12 an Vit D.     She has osteoporosis on recent Bone density.     She has mid lumbar back  pain, not chaed with position or much y activity.     She is on BiPAP 13/8 (pressures will reduce) and has excellent compliance on the d/l though AHI was good at 2.0 and had 90% compliance.       She has lost weight after being diagnosed  with NIDDM type 2 .  She is now on metformin.  Her HgbA1c is reportedly better  She is noting a little less fatigue with weight loss but not much different now.    Her 63 yo mother (has had colons urgency and bladder tumors recently) lives with her and she is doing all her chores.  She is sleeping worse but gets 6 hours most nights.  She has an occasioanl day she sleep   She has NAFLD (Fatty liver).  She has recent diagnosis of T2 NIDDM.     MS History:    In 2008, she had headaches and poor balance and was told the MRI had some white matter foci at that time.    She had spinal stenosis and needed surgery in 2014.  The surgeon felt her gait and other issues were decreased out of proportion to the extent of spine disease and referred her to Neurology.   Besides gait issues, she also had double  vision, blurry vision, cognitive difficulty and incontinence in early 2015. She saw Dr. Pamalee Leyden in [place]. An MRI of the brain, CSF and visual evoked potentials were consistent with multiple sclerosis.    Initially, she was placed on Tecfidera. Due to progression with more neurologic symptoms, she was switched to Tysabri.   Her last MRI was done 10/2014 but we do not have the images.    Imaging: MRI brain 05/14/2020 shows T2/FLAIR hyperintense foci in the hemispheres, pons and left basal ganglia.  Most of the foci are nonspecific.  A few are periventricular and radially oriented.  The focus in the left basal ganglia could also represent ischemic sequela.    None of the foci appear to be acute and they do not enhance.    There are no new lesions compared with 05/12/2018 MRI.  MRI of the cervical spine shows 1 or 2 small subtle foci within the spinal cord adjacent to C4-C5 and  C6.  These have been seen on previous MRIs.  Though nonspecific, this is consistent with a chronic demyelinating plaque.  Minimal chronic compressive myelopathic signal cannot be ruled out.  There are no new lesions and no enhancing lesions.   Prior C4-C6 ACDF.  There is no nerve root compression or spinal stenosis at these levels.   Mild spinal stenosis and mild foraminal narrowing at C3-C4 and C6-C7 that does not lead to any nerve root compression.  Degenerative changes are stable compared to the 2020 MRI.  MRI of the brain 05/19/2021 showed no new lesions.  MRI of the cervical spine 05/19/2021 showed no new lesions.   REVIEW OF SYSTEMS: Constitutional: No fevers, chills, sweats, or change in appetite.   She notes fatigue Eyes: Reports visual changes and double vision.  No eye pain Ear, nose and throat: No hearing loss, ear pain, nasal congestion, sore throat Cardiovascular: No chest pain, palpitations Respiratory:  No shortness of breath at rest or with exertion.   She has snoring and coughing but no wheezes GastrointestinaI: No nausea, vomiting, abdominal pain.  She notes diarrhea and some fecal incontinence Genitourinary:  No dysuria, urinary retention or frequency. She has had some incontinence Musculoskeletal:  No neck pain, back pain Integumentary: No rash, pruritus, skin lesions Neurological: as above Psychiatric: Some depression at this time.  Some anxiety Endocrine: No palpitations, diaphoresis, change in appetite, change in weigh or increased thirst Hematologic/Lymphatic:  No anemia, purpura, petechiae. Allergic/Immunologic: No itchy/runny eyes, nasal congestion, recent allergic reactions, rashes  ALLERGIES: Allergies  Allergen Reactions   Clindamycin Anaphylaxis   Indomethacin Hives   Levofloxacin Hives   Penicillins Anaphylaxis   Penicillins Cross Reactors Anaphylaxis   Sulfa Antibiotics Swelling    Blister (steven john syndrome)   Levaquin [Levofloxacin Hemihydrate]  Hives   Lipitor  [Atorvastatin Calcium]    Multihance [Gadobenate] Hives    Pt having hives/ rash on neck and chest    Sulfamethoxazole-Trimethoprim    Zocor [Simvastatin] Other (See Comments)    Other reaction(s): MUSCLE PAIN Muscle aches    HOME MEDICATIONS:  Current Outpatient Medications:    alendronate (FOSAMAX) 70 MG tablet, Take 70 mg by mouth once a week. Take with a full glass of water on an empty stomach., Disp: , Rfl:    aspirin EC 81 MG tablet, Take 81 mg by mouth every other day., Disp: , Rfl:    Biotin 10000 MCG TABS, Take by mouth daily. , Disp: , Rfl:    buPROPion (WELLBUTRIN SR) 150 MG 12 hr tablet,  Take 150 mg by mouth 2 (two) times daily. , Disp: , Rfl:    busPIRone (BUSPAR) 15 MG tablet, Take 1 tablet (15 mg total) by mouth 2 (two) times daily., Disp: 180 tablet, Rfl: 3   cholecalciferol (VITAMIN D) 1000 UNITS tablet, Take 5,000 Units by mouth daily., Disp: , Rfl:    Cladribine (MAVENCLAD, 9 TABS, PO), Take by mouth., Disp: , Rfl:    estradiol (CLIMARA - DOSED IN MG/24 HR) 0.0375 mg/24hr patch, estradiol 0.0375 mg/24 hr weekly transdermal patch, Disp: , Rfl:    ezetimibe (ZETIA) 10 MG tablet, Take 10 mg by mouth daily. , Disp: , Rfl:    gabapentin (NEURONTIN) 300 MG capsule, Take 1 capsule (300 mg total) by mouth 3 (three) times daily., Disp: 270 capsule, Rfl: 3   IRON-FOLIC ACID-VIT W11 PO, Take by mouth daily., Disp: , Rfl:    Krill Oil 1000 MG CAPS, Take 1,000 mg by mouth daily. , Disp: , Rfl:    metFORMIN (GLUCOPHAGE-XR) 500 MG 24 hr tablet, Take 2 tablets by mouth at bedtime. At supper, Disp: , Rfl:    Multiple Vitamins-Minerals (ZINC PO), Take 50 mg by mouth daily., Disp: , Rfl:    MYRBETRIQ 50 MG TB24 tablet, Take 1 tablet by mouth once daily, Disp: 90 tablet, Rfl: 3   omeprazole (PRILOSEC) 20 MG capsule, Take 20 mg by mouth daily. , Disp: , Rfl:    phentermine 37.5 MG capsule, Take 1 capsule by mouth in the morning, Disp: 30 capsule, Rfl: 5   predniSONE  (DELTASONE) 50 MG tablet, Take 1 tablet 13 hours, 7 hours and 1 hour (with benadryl) before MRI scan., Disp: 3 tablet, Rfl: 0   selenium 50 MCG TABS tablet, Take 50 mcg by mouth daily., Disp: , Rfl:    tiZANidine (ZANAFLEX) 2 MG tablet, Take 1 tablet (2 mg total) by mouth 3 (three) times daily., Disp: 90 tablet, Rfl: 11   TURMERIC PO, Take 1 capsule by mouth every morning. +Blackpepper 67m/5mg, Disp: , Rfl:    venlafaxine XR (EFFEXOR-XR) 150 MG 24 hr capsule, Take 2 capsules by mouth daily., Disp: , Rfl:    zinc gluconate 50 MG tablet, Take 50 mg by mouth daily., Disp: , Rfl:    ALPRAZolam (XANAX) 0.5 MG tablet, Take 1 tablet (0.5 mg total) by mouth at bedtime as needed for anxiety., Disp: 30 tablet, Rfl: 5  PAST MEDICAL HISTORY: Past Medical History:  Diagnosis Date   Ankle fracture    Diabetes ([place]    Fatty liver    Osteopenia    Scarlet fever    Shingles    Sleep apnea    Spina bifida    Splenomegaly    Thrombocytopenia (HCC)    Vitamin D deficiency     PAST SURGICAL HISTORY: Past Surgical History:  Procedure Laterality Date   ABDOMINAL HYSTERECTOMY     fibroids and heavy bleeding   BREAST BIOPSY Right    20+ yrs ago   CERVICAL FUSION      FAMILY HISTORY: Family History  Problem Relation Age of Onset   Bladder Cancer Mother    Colon cancer Mother    Colon cancer Father        diagnosed less than age of 512    Bladder Cancer Maternal Grandmother    Leukemia Maternal Grandmother     SOCIAL HISTORY:  Social History   Socioeconomic History   Marital status: Divorced    Spouse name: Not on file   Number  of children: Not on file   Years of education: Not on file   Highest education level: Not on file  Occupational History   Not on file  Tobacco Use   Smoking status: Some Days   Smokeless tobacco: Never   Tobacco comments:    3 cigarrettes per day.   Substance and Sexual Activity   Alcohol use: Yes    Comment: occasional use. a few times a year.    Drug  use: No   Sexual activity: Not on file  Other Topics Concern   Not on file  Social History Narrative   Not on file   Social Determinants of Health   Financial Resource Strain: Not on file  Food Insecurity: Not on file  Transportation Needs: Not on file  Physical Activity: Not on file  Stress: Not on file  Social Connections: Not on file  Intimate Partner Violence: Not on file     PHYSICAL EXAM  Vitals:   03/17/22 1519  BP: (!) 151/99  Pulse: (!) 102  Weight: 198 lb (89.8 kg)  Height: 5' 4"  (1.626 m)    Body mass index is 33.99 kg/m.   General: The patient is well-developed and well-nourished and in no acute distress  Neck: The neck is supple with good ROM and nontender.    Musculoskeletal:  She has bilateral , right > left carpometacarpal joint tenderness to palpation    Neurologic Exam  Mental status: The patient is alert and oriented x 3 at the time of the examination.  During the interaction today, she has apparent normal recent and remote memory, with an apparently normal attention span and concentration ability.   Speech is normal.  Cranial nerves: Extraocular movements appear normal.  Facial strength was normal.  Trapezius strength is normal.  No dysarthria is noted.  Hearing appears normal and symmetric.  Motor: She has a low amplitude fast tremor in the hands.  Muscle bulk is normal.   Tone is normal. Strength is  5 / 5 in all 4 extremities.   Sensory: She reports slightly reduced sensation to vibration in the right leg    Coordination:  finger-nose-finger is performed well.  Heel-to-shin is performed well.  Gait and station: Station is normal.  She has a mildly reduced stride..  The tandem gait is wide..  Romberg sign is negative.  Reflexes: Deep tendon reflexes are symmetric and 2+ arms and 3+ knees with spread.   There is no ankle clonus..    ASSESSMENT AND PLAN    1. Multiple sclerosis ([place])   2. High risk medication use   3. Thrombocytopenia  ([place])   4. Gait abnormality   5. Other fatigue   6. OSA treated with BiPAP   7. Anxiety and depression   8. Chronic midline low back pain without sciatica      1.  She successfully completed her second course of [place] April 2021.Marland Kitchen   MRI of the brain and cervical spine December 2022 was stable.  Will check brain MRI again in 2024 and consider restarting a disease modifying therapy if any activity.    2.   Continue BiPAP at pressure to 13/8.  She uses it nightly.  Advised to try to lose some weight.   3.   Continue Phentermine 37.5 mg in the morning for sleepiness, weight loss and fatigue.  Renew other medications. 4.  Diclofenac gel for [facility] joint pain. 5.   MRI lumbar for worsening LBP.  She has low lymphocyte  count.    6.   Xanax qHS for insomnia and muscle spasm Return in 6 months or sooner if there are new or worsening neurologic symptoms.  40-minute office visit with the majority of the time spent face-to-face for history and physical, discussion/counseling and decision-making.  Additional time with record review and documentation.   Helen Sandoval A. Felecia Shelling, MD, PhD 83/41/9622, 2:97 PM Certified in Neurology, Clinical Neurophysiology, Sleep Medicine, Pain Medicine and Neuroimaging . [facility] [address] [place], [place] [address] [phone]

## 2022-03-18 ENCOUNTER — Inpatient Hospital Stay: Payer: Medicare HMO | Attending: Physician Assistant | Admitting: Physician Assistant

## 2022-03-18 DIAGNOSIS — D696 Thrombocytopenia, unspecified: Secondary | ICD-10-CM

## 2022-03-18 LAB — COMPREHENSIVE METABOLIC PANEL
ALT: 47 IU/L — ABNORMAL HIGH (ref 0–32)
AST: 31 IU/L (ref 0–40)
Albumin/Globulin Ratio: 2.4 — ABNORMAL HIGH (ref 1.2–2.2)
Albumin: 5 g/dL — ABNORMAL HIGH (ref 3.9–4.9)
Alkaline Phosphatase: 87 IU/L (ref 44–121)
BUN/Creatinine Ratio: 18 (ref 12–28)
BUN: 17 mg/dL (ref 8–27)
Bilirubin Total: 0.5 mg/dL (ref 0.0–1.2)
CO2: 25 mmol/L (ref 20–29)
Calcium: 9.8 mg/dL (ref 8.7–10.3)
Chloride: 103 mmol/L (ref 96–106)
Creatinine, Ser: 0.95 mg/dL (ref 0.57–1.00)
Globulin, Total: 2.1 g/dL (ref 1.5–4.5)
Glucose: 95 mg/dL (ref 70–99)
Potassium: 4.2 mmol/L (ref 3.5–5.2)
Sodium: 143 mmol/L (ref 134–144)
Total Protein: 7.1 g/dL (ref 6.0–8.5)
eGFR: 67 mL/min/{1.73_m2} (ref >59)

## 2022-03-18 LAB — CBC WITH DIFFERENTIAL/PLATELET
Basophils Absolute: 0 10*3/uL (ref 0.0–0.2)
Basos: 0 %
EOS (ABSOLUTE): 0.1 10*3/uL (ref 0.0–0.4)
Eos: 1 %
Hematocrit: 38.7 % (ref 34.0–46.6)
Hemoglobin: 13 g/dL (ref 11.1–15.9)
Immature Grans (Abs): 0 10*3/uL (ref 0.0–0.1)
Immature Granulocytes: 0 %
Lymphocytes Absolute: 0.9 10*3/uL (ref 0.7–3.1)
Lymphs: 17 %
MCH: 32.3 pg (ref 26.6–33.0)
MCHC: 33.6 g/dL (ref 31.5–35.7)
MCV: 96 fL (ref 79–97)
Monocytes Absolute: 0.3 10*3/uL (ref 0.1–0.9)
Monocytes: 6 %
Neutrophils Absolute: 3.9 10*3/uL (ref 1.4–7.0)
Neutrophils: 76 %
Platelets: 134 10*3/uL — ABNORMAL LOW (ref 150–450)
RBC: 4.03 x10E6/uL (ref 3.77–5.28)
RDW: 13.5 % (ref 11.7–15.4)
WBC: 5.2 10*3/uL (ref 3.4–10.8)

## 2022-03-18 NOTE — Progress Notes (Signed)
[place] [phone]   [phone]  Patient Care Team: Helen Seashore, MD as PCP - General (Internal Medicine)  I connected with Helen Sandoval  on 03/18/22 by telephone visit and verified that I am speaking with the correct person using two identifiers.   I discussed the limitations, risks, security and privacy concerns of performing an evaluation and management service by telemedicine and the availability of in-person appointments. I also discussed with the patient that there may be a patient responsible charge related to this service. The patient expressed understanding and agreed to proceed.  Patient's location: Home Provider's location: Office  Hematological/Oncological History 1) Labs since starting Cladribine [facility]) for multiple sclerosis in February 2020:  08/29/2018: WBC 5.2, Hgb 13.1, Plt 135K, Lymphocyte 0.2 01/03/2019: WBC 4.5, Hgb 13.8, Plt 147K, Lymphocyte 0.3 06/05/2019: WBC 5.9, Hgb 13.7, Plt 145K, Lymphocyte 0.7 10/07/2019: WBC 4.0, Hgb 13.1, Plt 102K, Lymphocyte 0.1 02/12/2020: WBC 4.1, Hgb 12.6, Plt 89K, Lymphocyte 0.3 02/27/2020: WBC 4.0, Hgb 12.6, Plt 91K, Lymphocyte 0.3 03/31/2020: WBC 4.7, Hgb 13.0, Plt 91K, Lymphocyte 0.3 05/06/2020: WBC 4.6, Hgb 13.1, Plt 98K, Lymphocyte 0.4 08/12/2020: WBC 4.5, Hgb 12.0, Pkt 78K, Lymphocyte 0.4  2) 08/17/2020: Establish care with Dr. Lorenso Sandoval and Helen Query PA-C  CHIEF COMPLAINT: Thrombocytopenia   HISTORY OF PRESENTING ILLNESS:  Helen Sandoval 61 y.o. female returns for a follow up for thrombocytopenia.   On exam today, Helen Sandoval reports stable energy levels.  She does continue to have fatigue but completes all her daily activities on her own.  She denies any bruising or bleeding episodes.  Her appetite and weight are stable..  She denies any nausea, vomiting or abdominal pain.  Her bowel habits are regular without any recurrent episodes of  diarrhea or constipation.  She denies fevers, chills, night sweats, shortness of breath, chest pain or cough.  She has no other complaints.  Rest of the 10 point ROS is below.  MEDICAL HISTORY:  Past Medical History:  Diagnosis Date   Ankle fracture    Diabetes ([place])    Fatty liver    Osteopenia    Scarlet fever    Shingles    Sleep apnea    Spina bifida    Splenomegaly    Thrombocytopenia (HCC)    Vitamin D deficiency     SURGICAL HISTORY: Past Surgical History:  Procedure Laterality Date   ABDOMINAL HYSTERECTOMY     fibroids and heavy bleeding   BREAST BIOPSY Right    20+ yrs ago   CERVICAL FUSION      SOCIAL HISTORY: Social History   Socioeconomic History   Marital status: Divorced    Spouse name: Not on file   Number of children: Not on file   Years of education: Not on file   Highest education level: Not on file  Occupational History   Not on file  Tobacco Use   Smoking status: Some Days   Smokeless tobacco: Never   Tobacco comments:    3 cigarrettes per day.   Substance and Sexual Activity   Alcohol use: Yes    Comment: occasional use. a few times a year.    Drug use: No   Sexual activity: Not on file  Other Topics Concern   Not on file  Social History Narrative   Not on file   Social Determinants of Health   Financial Resource Strain: Not on file  Food Insecurity: Not on file  Transportation Needs: Not on file  Physical Activity: Not on file  Stress: Not on file  Social Connections: Not on file  Intimate Partner Violence: Not on file    FAMILY HISTORY: Family History  Problem Relation Age of Onset   Bladder Cancer Mother    Colon cancer Mother    Colon cancer Father        diagnosed less than age of 78.    Bladder Cancer Maternal Grandmother    Leukemia Maternal Grandmother     ALLERGIES:  is allergic to clindamycin, indomethacin, levofloxacin, penicillins, penicillins cross reactors, sulfa antibiotics, levaquin [levofloxacin  hemihydrate], lipitor  [atorvastatin calcium], multihance [gadobenate], sulfamethoxazole-trimethoprim, and zocor [simvastatin].  MEDICATIONS:  Current Outpatient Medications  Medication Sig Dispense Refill   alendronate (FOSAMAX) 70 MG tablet Take 70 mg by mouth once a week. Take with a full glass of water on an empty stomach.     ALPRAZolam (XANAX) 0.5 MG tablet Take 1 tablet (0.5 mg total) by mouth at bedtime as needed for anxiety. 30 tablet 5   aspirin EC 81 MG tablet Take 81 mg by mouth every other day.     Biotin 10000 MCG TABS Take by mouth daily.      buPROPion (WELLBUTRIN SR) 150 MG 12 hr tablet Take 150 mg by mouth 2 (two) times daily.      busPIRone (BUSPAR) 15 MG tablet Take 1 tablet (15 mg total) by mouth 2 (two) times daily. 180 tablet 3   cholecalciferol (VITAMIN D) 1000 UNITS tablet Take 5,000 Units by mouth daily.     Cladribine (MAVENCLAD, 9 TABS, PO) Take by mouth.     estradiol (CLIMARA - DOSED IN MG/24 HR) 0.0375 mg/24hr patch estradiol 0.0375 mg/24 hr weekly transdermal patch     ezetimibe (ZETIA) 10 MG tablet Take 10 mg by mouth daily.      gabapentin (NEURONTIN) 300 MG capsule Take 1 capsule (300 mg total) by mouth 3 (three) times daily. 270 capsule 3   IRON-FOLIC ACID-VIT N82 PO Take by mouth daily.     Krill Oil 1000 MG CAPS Take 1,000 mg by mouth daily.      metFORMIN (GLUCOPHAGE-XR) 500 MG 24 hr tablet Take 2 tablets by mouth at bedtime. At supper     Multiple Vitamins-Minerals (ZINC PO) Take 50 mg by mouth daily.     MYRBETRIQ 50 MG TB24 tablet Take 1 tablet by mouth once daily 90 tablet 3   omeprazole (PRILOSEC) 20 MG capsule Take 20 mg by mouth daily.      phentermine 37.5 MG capsule Take 1 capsule by mouth in the morning 30 capsule 5   predniSONE (DELTASONE) 50 MG tablet Take 1 tablet 13 hours, 7 hours and 1 hour (with benadryl) before MRI scan. 3 tablet 0   selenium 50 MCG TABS tablet Take 50 mcg by mouth daily.     tiZANidine (ZANAFLEX) 2 MG tablet Take 1  tablet (2 mg total) by mouth 3 (three) times daily. 90 tablet 11   TURMERIC PO Take 1 capsule by mouth every morning. +Blackpepper 671m/5mg     venlafaxine XR (EFFEXOR-XR) 150 MG 24 hr capsule Take 2 capsules by mouth daily.     zinc gluconate 50 MG tablet Take 50 mg by mouth daily.     No current facility-administered medications for this visit.    REVIEW OF SYSTEMS:   Constitutional: ( - ) fevers, ( - )  chills , ( - ) night sweats Eyes: ( - )  blurriness of vision, ( - ) double vision, ( - ) watery eyes Ears, nose, mouth, throat, and face: ( - ) mucositis, ( - ) sore throat Respiratory: ( - ) cough, ( - ) dyspnea, ( - ) wheezes Cardiovascular: ( - ) palpitation, ( - ) chest discomfort, ( - ) lower extremity swelling Gastrointestinal:  ( - ) nausea, ( - ) heartburn, ( - ) change in bowel habits Skin: ( - ) abnormal skin rashes Lymphatics: ( - ) new lymphadenopathy, ( - ) easy bruising Neurological: ( - ) numbness, ( - ) tingling, ( - ) new weaknesses Behavioral/Psych: ( - ) mood change, ( - ) new changes  All other systems were reviewed with the patient and are negative.  PHYSICAL EXAMINATION: Did not perform due to virtual visit.   LABORATORY DATA:  I have reviewed the data as listed    Latest Ref Rng & Units 03/17/2022    4:20 PM 08/31/2021    2:53 PM 08/26/2021    1:38 PM  CBC  WBC 3.4 - 10.8 x10E3/uL 5.2  5.7  4.6   Hemoglobin 11.1 - 15.9 g/dL 13.0  12.0  12.6   Hematocrit 34.0 - 46.6 % 38.7  35.3  38.2   Platelets 150 - 450 x10E3/uL 134  105  101        Latest Ref Rng & Units 03/17/2022    4:20 PM 08/26/2021    1:38 PM 03/02/2021    1:18 PM  CMP  Glucose 70 - 99 mg/dL 95  90  154   BUN 8 - 27 mg/dL _0 Creatinine 0.57 - 1.00 mg/dL 0.95  0.84  1.02   Sodium 134 - 144 mmol/L 143  143  138   Potassium 3.5 - 5.2 mmol/L 4.2  4.4  4.0   Chloride 96 - 106 mmol/L 103  102  104   CO2 20 - 29 mmol/L _1 Calcium 8.7 - 10.3 mg/dL 9.8  9.4  9.7   Total  Protein 6.0 - 8.5 g/dL 7.1  6.3  6.6   Total Bilirubin 0.0 - 1.2 mg/dL 0.5  0.3  0.5   Alkaline Phos 44 - 121 IU/L 87  77  81   AST 0 - 40 IU/L _2 ALT 0 - 32 IU/L 47  38  32    RADIOGRAPHIC STUDIES: No results found.   ASSESSMENT & PLAN Jaiyah Beining is a 61 y.o. female presenting to the clinic for evaluation for thrombocytopenia.  #Thrombocytopenia likely 2/2 medication and fatty liver with splenomegaly:  --Patient was taking Cladribine ([place]) for multiple sclerosis which can cause thrombocytopenia. She completed her second course in April 2021.  --There is evidence of fatty liver disease suggesting NAFLD and mild splenomegaly that was seen on the past MRIs from 09/11/2020 and 11/26/2020. This can be a contributing factor for thrombocytopenia.  --Labs from 08/17/2020 ruled out B12 and folate deficiencies. There is no evidence of pseudothrombocytopenia. --Labs today from yesterday show improvement of thrombocytopenia with Plt count of 134K. No other cytopenias.  --No further intervention is recommended at this time. Consider bone marrow biopsy if platelet counts worsen <100K.   --Patient continues to check blood counts with her neurologist, Dr. Felecia Shelling q 6 months. Okay to return to clinic as needed if platelets worsen <100K.   #Left hepatic lobe lesion: --Seen on abdominal US on 08/27/2020 that indicated  a 3.1 cm hypoechoic area in the left lobe of the liver.  --MRI abdomen from 09/11/2020 revealed 2.5 x 2.4 x 2.3 cm subcapsular lesion in the inferior aspect of the lateral segment left liver (segment III). 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 Recommended 3 month follow up.  --MRI abdomen from shows stable size of a 2.4 x 1.9 cm LEFT hepatic lobe lesion compared to the prior study dating back to 2012 and displays similar enhancement characteristics and signs of shunting. This may represent a hemangioma with associated AV shunting and has been present  reportedly dating back to 2012. --No further workup is required at this time. Continue to monitor.   #Severe hepatic steatosis: --Seen on MRIs from 09/11/2020 and 11/26/2020. --Reviewed findings and there is strong evidence of NAFLD. --Patient is currently under the care of gastroenterologist, Dr. Ronnette Juniper who is monitoring patient's labs to assess hepatic function.   No orders of the defined types were placed in this encounter.   All questions were answered. The patient knows to call the clinic with any problems, questions or concerns.  I have spent a total of 25 minutes minutes of non-face-to-face time, preparing to see the patient,counseling and educating the patient, documenting clinical information in the electronic health record, and care coordination.   Helen Query, PA-C Department of Hematology/Oncology [place] at [facility] Phone: [phone]

## 2022-03-21 ENCOUNTER — Ambulatory Visit
Admission: RE | Admit: 2022-03-21 | Discharge: 2022-03-21 | Disposition: A | Discharge status: Home / Self Care | Payer: Medicare HMO | Source: Ambulatory Visit | Attending: Neurology | Admitting: Neurology

## 2022-03-21 DIAGNOSIS — G8929 Other chronic pain: Secondary | ICD-10-CM | POA: Diagnosis not present

## 2022-03-21 DIAGNOSIS — M545 Low back pain, unspecified: Secondary | ICD-10-CM | POA: Diagnosis not present

## 2022-03-21 DIAGNOSIS — R269 Unspecified abnormalities of gait and mobility: Secondary | ICD-10-CM

## 2022-03-21 DIAGNOSIS — M5416 Radiculopathy, lumbar region: Secondary | ICD-10-CM | POA: Diagnosis not present

## 2022-03-21 DIAGNOSIS — G35 Multiple sclerosis: Secondary | ICD-10-CM

## 2022-03-22 DIAGNOSIS — D696 Thrombocytopenia, unspecified: Secondary | ICD-10-CM | POA: Diagnosis not present

## 2022-03-22 DIAGNOSIS — F321 Major depressive disorder, single episode, moderate: Secondary | ICD-10-CM | POA: Diagnosis not present

## 2022-03-22 DIAGNOSIS — Z0000 Encounter for general adult medical examination without abnormal findings: Secondary | ICD-10-CM | POA: Diagnosis not present

## 2022-03-22 DIAGNOSIS — E1165 Type 2 diabetes mellitus with hyperglycemia: Secondary | ICD-10-CM | POA: Diagnosis not present

## 2022-04-06 ENCOUNTER — Ambulatory Visit: Payer: Self-pay

## 2022-04-07 ENCOUNTER — Ambulatory Visit
Admission: RE | Admit: 2022-04-07 | Discharge: 2022-04-07 | Disposition: A | Discharge status: Home / Self Care | Payer: Medicare HMO | Source: Ambulatory Visit | Attending: Obstetrics & Gynecology | Admitting: Obstetrics & Gynecology

## 2022-04-07 DIAGNOSIS — Z0000 Encounter for general adult medical examination without abnormal findings: Secondary | ICD-10-CM | POA: Diagnosis not present

## 2022-04-07 DIAGNOSIS — Z01411 Encounter for gynecological examination (general) (routine) with abnormal findings: Secondary | ICD-10-CM | POA: Diagnosis not present

## 2022-04-07 DIAGNOSIS — Z1231 Encounter for screening mammogram for malignant neoplasm of breast: Secondary | ICD-10-CM

## 2022-04-07 DIAGNOSIS — Z124 Encounter for screening for malignant neoplasm of cervix: Secondary | ICD-10-CM | POA: Diagnosis not present

## 2022-04-07 DIAGNOSIS — Z90711 Acquired absence of uterus with remaining cervical stump: Secondary | ICD-10-CM | POA: Diagnosis not present

## 2022-04-07 DIAGNOSIS — Z6834 Body mass index (BMI) 34.0-34.9, adult: Secondary | ICD-10-CM | POA: Diagnosis not present

## 2022-04-07 DIAGNOSIS — Z79890 Hormone replacement therapy (postmenopausal): Secondary | ICD-10-CM | POA: Diagnosis not present

## 2022-04-07 DIAGNOSIS — Z01419 Encounter for gynecological examination (general) (routine) without abnormal findings: Secondary | ICD-10-CM | POA: Diagnosis not present

## 2022-04-07 DIAGNOSIS — Z113 Encounter for screening for infections with a predominantly sexual mode of transmission: Secondary | ICD-10-CM | POA: Diagnosis not present

## 2022-04-07 DIAGNOSIS — M810 Age-related osteoporosis without current pathological fracture: Secondary | ICD-10-CM | POA: Diagnosis not present

## 2022-04-07 NOTE — Patient Instructions (Signed)
Visit Information  Thank you for taking time to visit with me today. Please don't hesitate to contact me if I can be of assistance to you.   Following are the goals we discussed today:   Goals Addressed             This Visit's Progress    COMPLETED: I wiould like inofrmation about MS       Care Coordination Interventions: Reviewed medications with patient and discussed  Helen Sandoval has been quite busy lately, with a lot happening in her life. She's doing fine overall, but has been experiencing some trouble with a cough. She's taking brompheniramine-pseudoephedrine-DM 30-2-10 MG/5ML syrup and feels that it's helping to some extent. Unfortunately, she didn't receive the information that I had sent her about MS, so I'll be sending it again. She has also informed me that she's comfortable with no further follow-up, but has my contact information in case she needs it in the future. Active listening / Reflection utilized  Emotional Support Provided Problem Solving [place] strategies reviewed         .   If you are experiencing a Mental Health or [place] or need someone to talk to, please call 1-800-273-TALK (toll free, 24 hour hotline)  Patient verbalizes understanding of instructions and care plan provided today and agrees to view in [name]. Active MyChart status and patient understanding of how to access instructions and care plan via MyChart confirmed with patient.     Lazaro Arms RN, BSN, [facility]   Phone: [phone]

## 2022-04-07 NOTE — Patient Outreach (Signed)
  Care Coordination   Follow Up Visit Note   04/06/2022 Name: Rayvon Brandvold MRN: [id number] DOB: [birth year]  Tajai Suder is a 61 y.o. year old female who sees Merrilee Seashore, MD for primary care. I spoke with  [name] by phone today.  What matters to the patients health and wellness today?  I am doing well but very busy    Goals Addressed             This Visit's Progress    COMPLETED: I wiould like inofrmation about MS       Care Coordination Interventions: Reviewed medications with patient and discussed  Mrs. Mcglothlin has been quite busy lately, with a lot happening in her life. She's doing fine overall, but has been experiencing some trouble with a cough. She's taking brompheniramine-pseudoephedrine-DM 30-2-10 MG/5ML syrup and feels that it's helping to some extent. Unfortunately, she didn't receive the information that I had sent her about MS, so I'll be sending it again. She has also informed me that she's comfortable with no further follow-up, but has my contact information in case she needs it in the future. Active listening / Reflection utilized  Emotional Support Provided Problem [place] strategies reviewed         SDOH assessments and interventions completed:  No     Care Coordination Interventions Activated:  Yes  Care Coordination Interventions:  Yes, provided   Follow up plan: No further intervention required.   Encounter Outcome:  Pt. Visit Completed .   Lazaro Arms RN, BSN, [facility]   Phone: [phone]

## 2022-04-08 ENCOUNTER — Other Ambulatory Visit: Payer: Self-pay | Admitting: Obstetrics & Gynecology

## 2022-04-08 DIAGNOSIS — M810 Age-related osteoporosis without current pathological fracture: Secondary | ICD-10-CM

## 2022-05-06 ENCOUNTER — Other Ambulatory Visit: Payer: Self-pay | Admitting: Neurology

## 2022-05-27 DIAGNOSIS — Z20822 Contact with and (suspected) exposure to covid-19: Secondary | ICD-10-CM | POA: Diagnosis not present

## 2022-05-27 DIAGNOSIS — J209 Acute bronchitis, unspecified: Secondary | ICD-10-CM | POA: Diagnosis not present

## 2022-05-27 DIAGNOSIS — R059 Cough, unspecified: Secondary | ICD-10-CM | POA: Diagnosis not present

## 2022-05-27 DIAGNOSIS — J029 Acute pharyngitis, unspecified: Secondary | ICD-10-CM | POA: Diagnosis not present

## 2022-05-27 DIAGNOSIS — J019 Acute sinusitis, unspecified: Secondary | ICD-10-CM | POA: Diagnosis not present

## 2022-07-08 DIAGNOSIS — R059 Cough, unspecified: Secondary | ICD-10-CM | POA: Diagnosis not present

## 2022-07-08 DIAGNOSIS — Z20822 Contact with and (suspected) exposure to covid-19: Secondary | ICD-10-CM | POA: Diagnosis not present

## 2022-07-08 DIAGNOSIS — R5383 Other fatigue: Secondary | ICD-10-CM | POA: Diagnosis not present

## 2022-07-08 DIAGNOSIS — R439 Unspecified disturbances of smell and taste: Secondary | ICD-10-CM | POA: Diagnosis not present

## 2022-07-09 DIAGNOSIS — R5383 Other fatigue: Secondary | ICD-10-CM | POA: Diagnosis not present

## 2022-07-09 DIAGNOSIS — R439 Unspecified disturbances of smell and taste: Secondary | ICD-10-CM | POA: Diagnosis not present

## 2022-07-09 DIAGNOSIS — Z20822 Contact with and (suspected) exposure to covid-19: Secondary | ICD-10-CM | POA: Diagnosis not present

## 2022-07-09 DIAGNOSIS — R059 Cough, unspecified: Secondary | ICD-10-CM | POA: Diagnosis not present

## 2022-07-10 DIAGNOSIS — R5383 Other fatigue: Secondary | ICD-10-CM | POA: Diagnosis not present

## 2022-07-10 DIAGNOSIS — R439 Unspecified disturbances of smell and taste: Secondary | ICD-10-CM | POA: Diagnosis not present

## 2022-07-10 DIAGNOSIS — Z20822 Contact with and (suspected) exposure to covid-19: Secondary | ICD-10-CM | POA: Diagnosis not present

## 2022-07-10 DIAGNOSIS — R059 Cough, unspecified: Secondary | ICD-10-CM | POA: Diagnosis not present

## 2022-07-12 DIAGNOSIS — R059 Cough, unspecified: Secondary | ICD-10-CM | POA: Diagnosis not present

## 2022-07-12 DIAGNOSIS — N182 Chronic kidney disease, stage 2 (mild): Secondary | ICD-10-CM | POA: Diagnosis not present

## 2022-07-12 DIAGNOSIS — Z20822 Contact with and (suspected) exposure to covid-19: Secondary | ICD-10-CM | POA: Diagnosis not present

## 2022-07-12 DIAGNOSIS — E782 Mixed hyperlipidemia: Secondary | ICD-10-CM | POA: Diagnosis not present

## 2022-07-12 DIAGNOSIS — E038 Other specified hypothyroidism: Secondary | ICD-10-CM | POA: Diagnosis not present

## 2022-07-12 DIAGNOSIS — R5383 Other fatigue: Secondary | ICD-10-CM | POA: Diagnosis not present

## 2022-07-12 DIAGNOSIS — E1165 Type 2 diabetes mellitus with hyperglycemia: Secondary | ICD-10-CM | POA: Diagnosis not present

## 2022-07-12 DIAGNOSIS — R439 Unspecified disturbances of smell and taste: Secondary | ICD-10-CM | POA: Diagnosis not present

## 2022-07-13 DIAGNOSIS — R059 Cough, unspecified: Secondary | ICD-10-CM | POA: Diagnosis not present

## 2022-07-13 DIAGNOSIS — Z20822 Contact with and (suspected) exposure to covid-19: Secondary | ICD-10-CM | POA: Diagnosis not present

## 2022-07-13 DIAGNOSIS — R439 Unspecified disturbances of smell and taste: Secondary | ICD-10-CM | POA: Diagnosis not present

## 2022-07-13 DIAGNOSIS — R5383 Other fatigue: Secondary | ICD-10-CM | POA: Diagnosis not present

## 2022-07-16 DIAGNOSIS — R439 Unspecified disturbances of smell and taste: Secondary | ICD-10-CM | POA: Diagnosis not present

## 2022-07-16 DIAGNOSIS — R059 Cough, unspecified: Secondary | ICD-10-CM | POA: Diagnosis not present

## 2022-07-16 DIAGNOSIS — Z20822 Contact with and (suspected) exposure to covid-19: Secondary | ICD-10-CM | POA: Diagnosis not present

## 2022-07-16 DIAGNOSIS — R5383 Other fatigue: Secondary | ICD-10-CM | POA: Diagnosis not present

## 2022-07-19 DIAGNOSIS — E1165 Type 2 diabetes mellitus with hyperglycemia: Secondary | ICD-10-CM | POA: Diagnosis not present

## 2022-07-19 DIAGNOSIS — F321 Major depressive disorder, single episode, moderate: Secondary | ICD-10-CM | POA: Diagnosis not present

## 2022-07-19 DIAGNOSIS — R439 Unspecified disturbances of smell and taste: Secondary | ICD-10-CM | POA: Diagnosis not present

## 2022-07-19 DIAGNOSIS — Z20822 Contact with and (suspected) exposure to covid-19: Secondary | ICD-10-CM | POA: Diagnosis not present

## 2022-07-19 DIAGNOSIS — D696 Thrombocytopenia, unspecified: Secondary | ICD-10-CM | POA: Diagnosis not present

## 2022-07-19 DIAGNOSIS — R059 Cough, unspecified: Secondary | ICD-10-CM | POA: Diagnosis not present

## 2022-07-19 DIAGNOSIS — R5383 Other fatigue: Secondary | ICD-10-CM | POA: Diagnosis not present

## 2022-07-19 DIAGNOSIS — G720 Drug-induced myopathy: Secondary | ICD-10-CM | POA: Diagnosis not present

## 2022-08-02 ENCOUNTER — Other Ambulatory Visit: Payer: Self-pay | Admitting: Neurology

## 2022-08-05 ENCOUNTER — Encounter: Payer: Self-pay | Admitting: Pulmonary Disease

## 2022-08-05 ENCOUNTER — Ambulatory Visit: Payer: Medicare HMO | Admitting: Pulmonary Disease

## 2022-08-05 VITALS — BP 130/100 | HR 103 | Ht 64.0 in | Wt 197.0 lb

## 2022-08-05 DIAGNOSIS — F17200 Nicotine dependence, unspecified, uncomplicated: Secondary | ICD-10-CM | POA: Diagnosis not present

## 2022-08-05 DIAGNOSIS — J411 Mucopurulent chronic bronchitis: Secondary | ICD-10-CM | POA: Diagnosis not present

## 2022-08-05 MED ORDER — PREDNISONE 10 MG PO TABS: ORAL_TABLET | ORAL | 0 refills | Status: DC

## 2022-08-05 MED ORDER — SPACER/AERO-HOLDING CHAMBERS DEVI: 2.0000 | Freq: Two times a day (BID) | 0 refills | Status: DC

## 2022-08-05 MED ORDER — ALBUTEROL SULFATE (2.5 MG/3ML) 0.083% IN NEBU: 2.5000 mg | INHALATION_SOLUTION | Freq: Four times a day (QID) | RESPIRATORY_TRACT | 12 refills | Status: DC | PRN

## 2022-08-05 MED ORDER — ALBUTEROL SULFATE HFA 108 (90 BASE) MCG/ACT IN AERS: 2.0000 | INHALATION_SPRAY | Freq: Four times a day (QID) | RESPIRATORY_TRACT | 6 refills | Status: DC | PRN

## 2022-08-05 MED ORDER — BREZTRI AEROSPHERE 160-9-4.8 MCG/ACT IN AERO: 2.0000 | INHALATION_SPRAY | Freq: Two times a day (BID) | RESPIRATORY_TRACT | 3 refills | Status: DC

## 2022-08-05 MED ORDER — METHYLPREDNISOLONE ACETATE 80 MG/ML IJ SUSP: 80.0000 mg | Freq: Once | INTRAMUSCULAR | Status: DC

## 2022-08-05 NOTE — Progress Notes (Signed)
Synopsis: Referred in March 2024 for chronic cough by Merrilee Seashore, MD  Subjective:   PATIENT ID: Helen Sandoval GENDER: female DOB: 1960/05/24, MRN: [id number]  Chief Complaint  Patient presents with   Consult    Cough, SOB    This is a 62 year old female, morbidly obese, vitamin D deficient.  Has history of diabetes.  She has been a longstanding smoker since her 48s.  She is still smoking 3 to 5 cigarettes a day.  Here today complaining of chest tightness shortness of breath.  She has ongoing wheezing.  Nocturnal symptoms.  Cough sputum production.  She has had multiple bouts of recurrent bronchitis.  This has been going on for several years.    Past Medical History:  Diagnosis Date   Ankle fracture    Diabetes ([place])    Fatty liver    Osteopenia    Scarlet fever    Shingles    Sleep apnea    Spina bifida    Splenomegaly    Thrombocytopenia (HCC)    Vitamin D deficiency      Family History  Problem Relation Age of Onset   Bladder Cancer Mother    Colon cancer Mother    Colon cancer Father        diagnosed less than age of 37.    Bladder Cancer Maternal Grandmother    Leukemia Maternal Grandmother      Past Surgical History:  Procedure Laterality Date   ABDOMINAL HYSTERECTOMY     fibroids and heavy bleeding   BREAST BIOPSY Right    20+ yrs ago   CERVICAL FUSION      Social History   Socioeconomic History   Marital status: Divorced    Spouse name: Not on file   Number of children: Not on file   Years of education: Not on file   Highest education level: Not on file  Occupational History   Not on file  Tobacco Use   Smoking status: Some Days   Smokeless tobacco: Never   Tobacco comments:    3 cigarrettes per day. 08/05/2022 [place]  Substance and Sexual Activity   Alcohol use: Yes    Comment: occasional use. a few times a year.    Drug use: No   Sexual activity: Not on file  Other Topics Concern   Not on file  Social History Narrative   Not on  file   Social Determinants of Health   Financial Resource Strain: Not on file  Food Insecurity: Not on file  Transportation Needs: Not on file  Physical Activity: Not on file  Stress: Not on file  Social Connections: Not on file  Intimate Partner Violence: Not on file     Allergies  Allergen Reactions   Clindamycin Anaphylaxis   Indomethacin Hives   Levofloxacin Hives   Penicillins Anaphylaxis   Penicillins Cross Reactors Anaphylaxis   Sulfa Antibiotics Swelling    Blister (steven john syndrome)   Levaquin [Levofloxacin Hemihydrate] Hives   Lipitor  [Atorvastatin Calcium]    Multihance [Gadobenate] Hives    Pt having hives/ rash on neck and chest    Sulfamethoxazole-Trimethoprim    Zocor [Simvastatin] Other (See Comments)    Other reaction(s): MUSCLE PAIN Muscle aches     Outpatient Medications Prior to Visit  Medication Sig Dispense Refill   alendronate (FOSAMAX) 70 MG tablet Take 70 mg by mouth once a week. Take with a full glass of water on an empty stomach.  ALPRAZolam (XANAX) 0.5 MG tablet Take 1 tablet (0.5 mg total) by mouth at bedtime as needed for anxiety. 30 tablet 5   aspirin EC 81 MG tablet Take 81 mg by mouth every other day.     Biotin 10000 MCG TABS Take by mouth daily.      brompheniramine-pseudoephedrine-DM 30-2-10 MG/5ML syrup Take 5 mLs by mouth 3 (three) times daily as needed.     buPROPion (WELLBUTRIN SR) 150 MG 12 hr tablet Take 150 mg by mouth 2 (two) times daily.      busPIRone (BUSPAR) 15 MG tablet Take 1 tablet (15 mg total) by mouth 2 (two) times daily. 180 tablet 3   cholecalciferol (VITAMIN D) 1000 UNITS tablet Take 5,000 Units by mouth daily.     Cladribine (MAVENCLAD, 9 TABS, PO) Take by mouth.     estradiol (CLIMARA - DOSED IN MG/24 HR) 0.0375 mg/24hr patch estradiol 0.0375 mg/24 hr weekly transdermal patch     ezetimibe (ZETIA) 10 MG tablet Take 10 mg by mouth daily.      gabapentin (NEURONTIN) 300 MG capsule TAKE 1 CAPSULE BY MOUTH  THREE TIMES DAILY 270 capsule 1   IRON-FOLIC ACID-VIT 123456 PO Take by mouth daily.     Krill Oil 1000 MG CAPS Take 1,000 mg by mouth daily.      metFORMIN (GLUCOPHAGE-XR) 500 MG 24 hr tablet Take 2 tablets by mouth 2 (two) times daily with a meal. At supper     Multiple Vitamins-Minerals (ZINC PO) Take 50 mg by mouth daily.     MYRBETRIQ 50 MG TB24 tablet Take 1 tablet by mouth once daily 90 tablet 3   omeprazole (PRILOSEC) 20 MG capsule Take 20 mg by mouth daily.      phentermine 37.5 MG capsule Take 1 capsule by mouth in the morning 30 capsule 5   predniSONE (DELTASONE) 50 MG tablet Take 1 tablet 13 hours, 7 hours and 1 hour (with benadryl) before MRI scan. 3 tablet 0   selenium 50 MCG TABS tablet Take 50 mcg by mouth daily.     TURMERIC PO Take 1 capsule by mouth every morning. +Blackpepper 600mg /5mg      venlafaxine XR (EFFEXOR-XR) 150 MG 24 hr capsule Take 2 capsules by mouth daily.     zinc gluconate 50 MG tablet Take 50 mg by mouth daily.     tiZANidine (ZANAFLEX) 2 MG tablet Take 1 tablet (2 mg total) by mouth 3 (three) times daily. (Patient not taking: Reported on 04/06/2022) 90 tablet 11   No facility-administered medications prior to visit.    Review of Systems  Constitutional:  Negative for chills, fever, malaise/fatigue and weight loss.  HENT:  Negative for hearing loss, sore throat and tinnitus.   Eyes:  Negative for blurred vision and double vision.  Respiratory:  Positive for cough, sputum production, shortness of breath and wheezing. Negative for hemoptysis and stridor.   Cardiovascular:  Negative for chest pain, palpitations, orthopnea, leg swelling and PND.  Gastrointestinal:  Negative for abdominal pain, constipation, diarrhea, heartburn, nausea and vomiting.  Genitourinary:  Negative for dysuria, hematuria and urgency.  Musculoskeletal:  Negative for joint pain and myalgias.  Skin:  Negative for itching and rash.  Neurological:  Negative for dizziness, tingling,  weakness and headaches.  Endo/Heme/Allergies:  Negative for environmental allergies. Does not bruise/bleed easily.  Psychiatric/Behavioral:  Negative for depression. The patient is not nervous/anxious and does not have insomnia.   All other systems reviewed and are negative.  Objective:  Physical Exam Vitals reviewed.  Constitutional:      General: She is not in acute distress.    Appearance: She is well-developed. She is obese.  HENT:     Head: Normocephalic and atraumatic.  Eyes:     General: No scleral icterus.    Conjunctiva/sclera: Conjunctivae normal.     Pupils: Pupils are equal, round, and reactive to light.  Neck:     Vascular: No JVD.     Trachea: No tracheal deviation.  Cardiovascular:     Rate and Rhythm: Normal rate and regular rhythm.     Heart sounds: Normal heart sounds. No murmur heard. Pulmonary:     Effort: Pulmonary effort is normal. No tachypnea, accessory muscle usage or respiratory distress.     Breath sounds: No stridor. Wheezing, rhonchi and rales present.  Abdominal:     General: There is no distension.     Palpations: Abdomen is soft.     Tenderness: There is no abdominal tenderness.  Musculoskeletal:        General: No tenderness.     Cervical back: Neck supple.  Lymphadenopathy:     Cervical: No cervical adenopathy.  Skin:    General: Skin is warm and dry.     Capillary Refill: Capillary refill takes less than 2 seconds.     Findings: No rash.  Neurological:     Mental Status: She is alert and oriented to person, place, and time.  Psychiatric:        Behavior: Behavior normal.      Vitals:   08/05/22 1448  BP: (!) 130/100  Pulse: (!) 103  SpO2: 98%  Weight: 197 lb (89.4 kg)  Height: 5\' 4"  (1.626 m)   98% on RA BMI Readings from Last 3 Encounters:  08/05/22 33.81 kg/m  03/17/22 33.99 kg/m  08/31/21 33.57 kg/m   Wt Readings from Last 3 Encounters:  08/05/22 197 lb (89.4 kg)  03/17/22 198 lb (89.8 kg)  08/31/21 195 lb  9.6 oz (88.7 kg)     CBC    Component Value Date/Time   WBC 5.2 03/17/2022 1620   WBC 5.7 08/31/2021 1453   RBC 4.03 03/17/2022 1620   RBC 3.73 (L) 08/31/2021 1453   HGB 13.0 03/17/2022 1620   HCT 38.7 03/17/2022 1620   PLT 134 (L) 03/17/2022 1620   MCV 96 03/17/2022 1620   MCH 32.3 03/17/2022 1620   MCH 32.2 08/31/2021 1453   MCHC 33.6 03/17/2022 1620   MCHC 34.0 08/31/2021 1453   RDW 13.5 03/17/2022 1620   LYMPHSABS 0.9 03/17/2022 1620   MONOABS 0.4 08/31/2021 1453   EOSABS 0.1 03/17/2022 1620   BASOSABS 0.0 03/17/2022 1620     Chest Imaging:   Pulmonary Functions Testing Results:     No data to display          FeNO:   Pathology:   Echocardiogram:   Heart Catheterization:     Assessment & Plan:     ICD-10-CM   1. Mucopurulent chronic bronchitis ([place])  J41.1 Pulmonary Function Test    Ambulatory Referral for DME    methylPREDNISolone acetate (DEPO-MEDROL) injection 80 mg    2. Smoker  F17.200       Discussion:  This is a 62 year old morbidly obese female with multiple medical comorbidities.  Presents with recurrent mucopurulent chronic bronchitis symptoms.  She has not had any PFTs before.  The symptoms have been going on and off for several months.  Plan: Counseled  heavily on smoking cessation. Her cough is never going to go away until she stopped smoking. Currently not on any maintenance inhalers. Start [employer] Start prednisone taper Start albuterol as needed as well as albuterol nebulizer. Hopefully get her about 6 to 8 weeks from now with symptoms that are settled down we can consider PFTs. Give her injection today of Depo-Medrol. RTC in 8 weeks with PFTs prior to appointment with me or APP.   Current Outpatient Medications:    albuterol (PROVENTIL) (2.5 MG/3ML) 0.083% nebulizer solution, Take 3 mLs (2.5 mg total) by nebulization every 6 (six) hours as needed for wheezing or shortness of breath., Disp: 75 mL, Rfl: 12   albuterol  (VENTOLIN HFA) 108 (90 Base) MCG/ACT inhaler, Inhale 2 puffs into the lungs every 6 (six) hours as needed for wheezing or shortness of breath., Disp: 8 g, Rfl: 6   alendronate (FOSAMAX) 70 MG tablet, Take 70 mg by mouth once a week. Take with a full glass of water on an empty stomach., Disp: , Rfl:    ALPRAZolam (XANAX) 0.5 MG tablet, Take 1 tablet (0.5 mg total) by mouth at bedtime as needed for anxiety., Disp: 30 tablet, Rfl: 5   aspirin EC 81 MG tablet, Take 81 mg by mouth every other day., Disp: , Rfl:    Biotin 10000 MCG TABS, Take by mouth daily. , Disp: , Rfl:    brompheniramine-pseudoephedrine-DM 30-2-10 MG/5ML syrup, Take 5 mLs by mouth 3 (three) times daily as needed., Disp: , Rfl:    Budeson-Glycopyrrol-Formoterol (BREZTRI AEROSPHERE) 160-9-4.8 MCG/ACT AERO, Inhale 2 puffs into the lungs in the morning and at bedtime., Disp: 3 each, Rfl: 3   buPROPion (WELLBUTRIN SR) 150 MG 12 hr tablet, Take 150 mg by mouth 2 (two) times daily. , Disp: , Rfl:    busPIRone (BUSPAR) 15 MG tablet, Take 1 tablet (15 mg total) by mouth 2 (two) times daily., Disp: 180 tablet, Rfl: 3   cholecalciferol (VITAMIN D) 1000 UNITS tablet, Take 5,000 Units by mouth daily., Disp: , Rfl:    Cladribine (MAVENCLAD, 9 TABS, PO), Take by mouth., Disp: , Rfl:    estradiol (CLIMARA - DOSED IN MG/24 HR) 0.0375 mg/24hr patch, estradiol 0.0375 mg/24 hr weekly transdermal patch, Disp: , Rfl:    ezetimibe (ZETIA) 10 MG tablet, Take 10 mg by mouth daily. , Disp: , Rfl:    gabapentin (NEURONTIN) 300 MG capsule, TAKE 1 CAPSULE BY MOUTH THREE TIMES DAILY, Disp: 270 capsule, Rfl: 1   IRON-FOLIC ACID-VIT 123456 PO, Take by mouth daily., Disp: , Rfl:    Krill Oil 1000 MG CAPS, Take 1,000 mg by mouth daily. , Disp: , Rfl:    metFORMIN (GLUCOPHAGE-XR) 500 MG 24 hr tablet, Take 2 tablets by mouth 2 (two) times daily with a meal. At supper, Disp: , Rfl:    Multiple Vitamins-Minerals (ZINC PO), Take 50 mg by mouth daily., Disp: , Rfl:     MYRBETRIQ 50 MG TB24 tablet, Take 1 tablet by mouth once daily, Disp: 90 tablet, Rfl: 3   omeprazole (PRILOSEC) 20 MG capsule, Take 20 mg by mouth daily. , Disp: , Rfl:    phentermine 37.5 MG capsule, Take 1 capsule by mouth in the morning, Disp: 30 capsule, Rfl: 5   predniSONE (DELTASONE) 10 MG tablet, Take 4 tabs by mouth once daily x4 days, then 3 tabs x4 days, 2 tabs x4 days, 1 tab x4 days and stop., Disp: 40 tablet, Rfl: 0   predniSONE (DELTASONE) 50 MG  tablet, Take 1 tablet 13 hours, 7 hours and 1 hour (with benadryl) before MRI scan., Disp: 3 tablet, Rfl: 0   selenium 50 MCG TABS tablet, Take 50 mcg by mouth daily., Disp: , Rfl:    Spacer/Aero-Holding Chambers DEVI, 2 puffs by Does not apply route 2 (two) times daily. Please use with MDI inhalers, Disp: 1 each, Rfl: 0   TURMERIC PO, Take 1 capsule by mouth every morning. +Blackpepper 600mg /5mg , Disp: , Rfl:    venlafaxine XR (EFFEXOR-XR) 150 MG 24 hr capsule, Take 2 capsules by mouth daily., Disp: , Rfl:    zinc gluconate 50 MG tablet, Take 50 mg by mouth daily., Disp: , Rfl:    tiZANidine (ZANAFLEX) 2 MG tablet, Take 1 tablet (2 mg total) by mouth 3 (three) times daily. (Patient not taking: Reported on 04/06/2022), Disp: 90 tablet, Rfl: 11  Current Facility-Administered Medications:    methylPREDNISolone acetate (DEPO-MEDROL) injection 80 mg, 80 mg, Intramuscular, Once, Royden Bulman, Octavio Graves, DO    Garner Nash, DO [facility] 08/05/2022 4:27 PM

## 2022-08-05 NOTE — Patient Instructions (Signed)
Thank you for visiting Dr. Valeta Harms at [facility]. Today we recommend the following:  Orders Placed This Encounter  Procedures   Pulmonary Function Test   Meds ordered this encounter  Medications   Budeson-Glycopyrrol-Formoterol (BREZTRI AEROSPHERE) 160-9-4.8 MCG/ACT AERO    Sig: Inhale 2 puffs into the lungs in the morning and at bedtime.    Dispense:  3 each    Refill:  3   predniSONE (DELTASONE) 10 MG tablet    Sig: Take 4 tabs by mouth once daily x4 days, then 3 tabs x4 days, 2 tabs x4 days, 1 tab x4 days and stop.    Dispense:  40 tablet    Refill:  0   albuterol (VENTOLIN HFA) 108 (90 Base) MCG/ACT inhaler    Sig: Inhale 2 puffs into the lungs every 6 (six) hours as needed for wheezing or shortness of breath.    Dispense:  8 g    Refill:  6   albuterol (PROVENTIL) (2.5 MG/3ML) 0.083% nebulizer solution    Sig: Take 3 mLs (2.5 mg total) by nebulization every 6 (six) hours as needed for wheezing or shortness of breath.    Dispense:  75 mL    Refill:  12   Return in about 8 weeks (around 09/30/2022) for with APP or Dr. Valeta Harms.    Please do your part to reduce the spread of COVID-19.

## 2022-08-13 ENCOUNTER — Other Ambulatory Visit: Payer: Self-pay | Admitting: Neurology

## 2022-08-15 NOTE — Telephone Encounter (Signed)
Last seen on 03/17/2022 Follow up scheduled on 09/29/22

## 2022-09-02 ENCOUNTER — Other Ambulatory Visit: Payer: Self-pay | Admitting: Pulmonary Disease

## 2022-09-26 ENCOUNTER — Telehealth: Payer: Self-pay | Admitting: Neurology

## 2022-09-26 NOTE — Telephone Encounter (Signed)
LVM and sent mychart msg informing pt of appt change- MD out 5/9.

## 2022-09-28 ENCOUNTER — Ambulatory Visit: Payer: Medicare HMO | Admitting: Pulmonary Disease

## 2022-09-28 ENCOUNTER — Ambulatory Visit (INDEPENDENT_AMBULATORY_CARE_PROVIDER_SITE_OTHER): Payer: Medicare HMO | Admitting: Pulmonary Disease

## 2022-09-28 ENCOUNTER — Encounter: Payer: Self-pay | Admitting: Pulmonary Disease

## 2022-09-28 VITALS — BP 110/88 | HR 93 | Ht 64.0 in | Wt 199.4 lb

## 2022-09-28 DIAGNOSIS — R062 Wheezing: Secondary | ICD-10-CM | POA: Diagnosis not present

## 2022-09-28 DIAGNOSIS — F17200 Nicotine dependence, unspecified, uncomplicated: Secondary | ICD-10-CM | POA: Diagnosis not present

## 2022-09-28 DIAGNOSIS — Z720 Tobacco use: Secondary | ICD-10-CM

## 2022-09-28 DIAGNOSIS — J411 Mucopurulent chronic bronchitis: Secondary | ICD-10-CM

## 2022-09-28 DIAGNOSIS — F17210 Nicotine dependence, cigarettes, uncomplicated: Secondary | ICD-10-CM

## 2022-09-28 LAB — PULMONARY FUNCTION TEST
DL/VA % pred: 106 %
DL/VA: 4.47 ml/min/mmHg/L
DLCO cor % pred: 109 %
DLCO cor: 21.88 ml/min/mmHg
DLCO unc % pred: 109 %
DLCO unc: 21.88 ml/min/mmHg
FEF 25-75 Post: 2.04 L/sec
FEF 25-75 Pre: 2.2 L/sec
FEF2575-%Change-Post: -7 %
FEF2575-%Pred-Post: 94 %
FEF2575-%Pred-Pre: 101 %
FEV1-%Change-Post: -2 %
FEV1-%Pred-Post: 97 %
FEV1-%Pred-Pre: 99 %
FEV1-Post: 2.37 L
FEV1-Pre: 2.44 L
FEV1FVC-%Change-Post: -1 %
FEV1FVC-%Pred-Pre: 101 %
FEV6-%Change-Post: 0 %
FEV6-%Pred-Post: 100 %
FEV6-%Pred-Pre: 101 %
FEV6-Post: 3.09 L
FEV6-Pre: 3.12 L
FEV6FVC-%Change-Post: 0 %
FEV6FVC-%Pred-Post: 103 %
FEV6FVC-%Pred-Pre: 104 %
FVC-%Change-Post: -1 %
FVC-%Pred-Post: 97 %
FVC-%Pred-Pre: 98 %
FVC-Post: 3.1 L
FVC-Pre: 3.13 L
Post FEV1/FVC ratio: 77 %
Post FEV6/FVC ratio: 100 %
Pre FEV1/FVC ratio: 78 %
Pre FEV6/FVC Ratio: 100 %
RV % pred: 84 %
RV: 1.75 L
TLC % pred: 110 %
TLC: 5.56 L

## 2022-09-28 MED ORDER — VARENICLINE TARTRATE 1 MG PO TABS: 1.0000 mg | ORAL_TABLET | Freq: Two times a day (BID) | ORAL | 0 refills | Status: DC

## 2022-09-28 NOTE — Patient Instructions (Signed)
Thank you for visiting Dr. Tonia Brooms at [facility]. Today we recommend the following:  Orders Placed This Encounter  Procedures   CBC w/Diff   REGIONAL PANEL 2   IgE   Ambulatory Referral for Lung Cancer Scre   Meds ordered this encounter  Medications   varenicline (CHANTIX CONTINUING MONTH PAK) 1 MG tablet    Sig: Take 1 tablet (1 mg total) by mouth 2 (two) times daily.    Dispense:  60 tablet    Refill:  0   Return in about 2 months (around 11/28/2022) for with APP.    Please do your part to reduce the spread of COVID-19.

## 2022-09-28 NOTE — Progress Notes (Signed)
Shared Decision Making Visit Lung Cancer Screening Program [id number])  Eligibility: Age 62 y.o. Pack Years Smoking History Calculation 22 (# packs/per year x # years smoked) Recent History of coughing up blood  no Unexplained weight loss? no ( >Than 15 pounds within the last 6 months ) Prior History Lung / other cancer no (Diagnosis within the last 5 years already requiring surveillance chest CT Scans). Smoking Status Current Smoker  Visit Components: Discussion included one or more decision making aids. yes Discussion included risk/benefits of screening. yes Discussion included potential follow up diagnostic testing for abnormal scans. yes Discussion included meaning and risk of over diagnosis. yes Discussion included meaning and risk of False Positives. yes Discussion included meaning of total radiation exposure. yes  Counseling Included: Importance of adherence to annual lung cancer LDCT screening. yes Impact of comorbidities on ability to participate in the program. yes Ability and willingness to under diagnostic treatment. yes  Smoking Cessation Counseling: Current Smokers:  Discussed importance of smoking cessation. yes Information about tobacco cessation classes and interventions provided to patient. yes Diagnosis Code: Tobacco Use Z72.0 Asymptomatic Patient yes  Counseling (Intermediate counseling: > three minutes counseling) W1191   Helen Igo, DO [facility] 09/28/2022 3:20 PM

## 2022-09-28 NOTE — Progress Notes (Signed)
Full PFT performed today. °

## 2022-09-28 NOTE — Progress Notes (Signed)
Synopsis: Referred in March 2024 for chronic cough by Georgianne Fick, MD  Subjective:   PATIENT ID: Helen Sandoval GENDER: female DOB: 1960-05-27, MRN: [id number]  Chief Complaint  Patient presents with   Follow-up    F/up on PFT    This is a 62 year old female, morbidly obese, vitamin D deficient.  Has history of diabetes.  She has been a longstanding smoker since her 35s.  She is still smoking 3 to 5 cigarettes a day.  Here today complaining of chest tightness shortness of breath.  She has ongoing wheezing.  Nocturnal symptoms.  Cough sputum production.  She has had multiple bouts of recurrent bronchitis.  This has been going on for several years.  OV 09/28/2022: Here today for follow-up.  She is doing much better.  Her symptoms have improved.  She is no longer having chest tightness cough and wheezing.  Her pulmonary function test reviewed today that shows reduced ERV, normal ratio, normal spirometry, mildly elevated DLCO however, no significant bronchodilator response.  No evidence of obstruction on spirometry.  She does feel much better using the [facility] inhaler.    Past Medical History:  Diagnosis Date   Ankle fracture    Diabetes (HCC)    Fatty liver    Osteopenia    Scarlet fever    Shingles    Sleep apnea    Spina bifida    Splenomegaly    Thrombocytopenia (HCC)    Vitamin D deficiency      Family History  Problem Relation Age of Onset   Bladder Cancer Mother    Colon cancer Mother    Colon cancer Father        diagnosed less than age of 75.    Bladder Cancer Maternal Grandmother    Leukemia Maternal Grandmother      Past Surgical History:  Procedure Laterality Date   ABDOMINAL HYSTERECTOMY     fibroids and heavy bleeding   BREAST BIOPSY Right    20+ yrs ago   CERVICAL FUSION      Social History   Socioeconomic History   Marital status: Divorced    Spouse name: Not on file   Number of children: Not on file   Years of education: Not on file    Highest education level: Not on file  Occupational History   Not on file  Tobacco Use   Smoking status: Some Days   Smokeless tobacco: Never   Tobacco comments:    1 cigarrettes per day. 09/28/2022 [place]  Substance and Sexual Activity   Alcohol use: Yes    Comment: occasional use. a few times a year.    Drug use: No   Sexual activity: Not on file  Other Topics Concern   Not on file  Social History Narrative   Not on file   Social Determinants of Health   Financial Resource Strain: Not on file  Food Insecurity: Not on file  Transportation Needs: Not on file  Physical Activity: Not on file  Stress: Not on file  Social Connections: Not on file  Intimate Partner Violence: Not on file     Allergies  Allergen Reactions   Clindamycin Anaphylaxis   Indomethacin Hives   Levofloxacin Hives   Penicillins Anaphylaxis   Penicillins Cross Reactors Anaphylaxis   Sulfa Antibiotics Swelling    Blister (steven john syndrome)   Levaquin [Levofloxacin Hemihydrate] Hives   Lipitor  [Atorvastatin Calcium]    Multihance [Gadobenate] Hives    Pt having hives/  rash on neck and chest    Sulfamethoxazole-Trimethoprim    Zocor [Simvastatin] Other (See Comments)    Other reaction(s): MUSCLE PAIN Muscle aches     Outpatient Medications Prior to Visit  Medication Sig Dispense Refill   albuterol (PROVENTIL) (2.5 MG/3ML) 0.083% nebulizer solution Take 3 mLs (2.5 mg total) by nebulization every 6 (six) hours as needed for wheezing or shortness of breath. 75 mL 12   albuterol (VENTOLIN HFA) 108 (90 Base) MCG/ACT inhaler Inhale 2 puffs into the lungs every 6 (six) hours as needed for wheezing or shortness of breath. 8 g 6   alendronate (FOSAMAX) 70 MG tablet Take 70 mg by mouth once a week. Take with a full glass of water on an empty stomach.     ALPRAZolam (XANAX) 0.5 MG tablet Take 1 tablet (0.5 mg total) by mouth at bedtime as needed for anxiety. 30 tablet 5   aspirin EC 81 MG tablet Take 81 mg by  mouth every other day.     Biotin 16109 MCG TABS Take by mouth daily.      brompheniramine-pseudoephedrine-DM 30-2-10 MG/5ML syrup Take 5 mLs by mouth 3 (three) times daily as needed.     Budeson-Glycopyrrol-Formoterol (BREZTRI AEROSPHERE) 160-9-4.8 MCG/ACT AERO Inhale 2 puffs into the lungs in the morning and at bedtime. 3 each 3   buPROPion (WELLBUTRIN SR) 150 MG 12 hr tablet Take 150 mg by mouth 2 (two) times daily.      busPIRone (BUSPAR) 15 MG tablet Take 1 tablet (15 mg total) by mouth 2 (two) times daily. 180 tablet 3   cholecalciferol (VITAMIN D) 1000 UNITS tablet Take 5,000 Units by mouth daily.     Cladribine (MAVENCLAD, 9 TABS, PO) Take by mouth.     estradiol (CLIMARA - DOSED IN MG/24 HR) 0.0375 mg/24hr patch estradiol 0.0375 mg/24 hr weekly transdermal patch     ezetimibe (ZETIA) 10 MG tablet Take 10 mg by mouth daily.      gabapentin (NEURONTIN) 300 MG capsule TAKE 1 CAPSULE BY MOUTH THREE TIMES DAILY 270 capsule 1   IRON-FOLIC ACID-VIT B12 PO Take by mouth daily.     Krill Oil 1000 MG CAPS Take 1,000 mg by mouth daily.      metFORMIN (GLUCOPHAGE-XR) 500 MG 24 hr tablet Take 2 tablets by mouth 2 (two) times daily with a meal. At supper     Multiple Vitamins-Minerals (ZINC PO) Take 50 mg by mouth daily.     MYRBETRIQ 50 MG TB24 tablet Take 1 tablet by mouth once daily 90 tablet 0   omeprazole (PRILOSEC) 20 MG capsule Take 20 mg by mouth daily.      phentermine 37.5 MG capsule Take 1 capsule by mouth in the morning 30 capsule 5   predniSONE (DELTASONE) 10 MG tablet Take 4 tabs by mouth once daily x4 days, then 3 tabs x4 days, 2 tabs x4 days, 1 tab x4 days and stop. 40 tablet 0   predniSONE (DELTASONE) 50 MG tablet Take 1 tablet 13 hours, 7 hours and 1 hour (with benadryl) before MRI scan. 3 tablet 0   selenium 50 MCG TABS tablet Take 50 mcg by mouth daily.     Spacer/Aero-Holding Chambers (EQ SPACE CHAMBER ANTI-STATIC) DEVI USE AS DIRECTED WITH  INHALERS 1 each 0   TURMERIC PO Take  1 capsule by mouth every morning. +Blackpepper 600mg /5mg      venlafaxine XR (EFFEXOR-XR) 150 MG 24 hr capsule Take 2 capsules by mouth daily.  zinc gluconate 50 MG tablet Take 50 mg by mouth daily.     tiZANidine (ZANAFLEX) 2 MG tablet Take 1 tablet (2 mg total) by mouth 3 (three) times daily. (Patient not taking: Reported on 04/06/2022) 90 tablet 11   Facility-Administered Medications Prior to Visit  Medication Dose Route Frequency Provider Last Rate Last Admin   methylPREDNISolone acetate (DEPO-MEDROL) injection 80 mg  80 mg Intramuscular Once Lunabelle Oatley L, DO        Review of Systems  Constitutional:  Negative for chills, fever, malaise/fatigue and weight loss.  HENT:  Negative for hearing loss, sore throat and tinnitus.   Eyes:  Negative for blurred vision and double vision.  Respiratory:  Negative for cough, hemoptysis, sputum production, shortness of breath, wheezing and stridor.   Cardiovascular:  Negative for chest pain, palpitations, orthopnea, leg swelling and PND.  Gastrointestinal:  Negative for abdominal pain, constipation, diarrhea, heartburn, nausea and vomiting.  Genitourinary:  Negative for dysuria, hematuria and urgency.  Musculoskeletal:  Negative for joint pain and myalgias.  Skin:  Negative for itching and rash.  Neurological:  Negative for dizziness, tingling, weakness and headaches.  Endo/Heme/Allergies:  Negative for environmental allergies. Does not bruise/bleed easily.  Psychiatric/Behavioral:  Negative for depression. The patient is not nervous/anxious and does not have insomnia.   All other systems reviewed and are negative.    Objective:  Physical Exam Vitals reviewed.  Constitutional:      General: She is not in acute distress.    Appearance: She is well-developed. She is obese.  HENT:     Head: Normocephalic and atraumatic.  Eyes:     General: No scleral icterus.    Conjunctiva/sclera: Conjunctivae normal.     Pupils: Pupils are equal,  round, and reactive to light.  Neck:     Vascular: No JVD.     Trachea: No tracheal deviation.  Cardiovascular:     Rate and Rhythm: Normal rate and regular rhythm.     Heart sounds: Normal heart sounds. No murmur heard. Pulmonary:     Effort: Pulmonary effort is normal. No tachypnea, accessory muscle usage or respiratory distress.     Breath sounds: No stridor. No wheezing, rhonchi or rales.  Abdominal:     General: There is no distension.     Palpations: Abdomen is soft.     Tenderness: There is no abdominal tenderness.  Musculoskeletal:        General: No tenderness.     Cervical back: Neck supple.  Lymphadenopathy:     Cervical: No cervical adenopathy.  Skin:    General: Skin is warm and dry.     Capillary Refill: Capillary refill takes less than 2 seconds.     Findings: No rash.  Neurological:     Mental Status: She is alert and oriented to person, place, and time.  Psychiatric:        Behavior: Behavior normal.      Vitals:   09/28/22 1458  BP: 110/88  Pulse: 93  SpO2: 98%  Weight: 199 lb 6.4 oz (90.4 kg)  Height: 5\' 4"  (1.626 m)   98% on RA BMI Readings from Last 3 Encounters:  09/28/22 34.23 kg/m  08/05/22 33.81 kg/m  03/17/22 33.99 kg/m   Wt Readings from Last 3 Encounters:  09/28/22 199 lb 6.4 oz (90.4 kg)  08/05/22 197 lb (89.4 kg)  03/17/22 198 lb (89.8 kg)     CBC    Component Value Date/Time   WBC 5.2 03/17/2022 1620  WBC 5.7 08/31/2021 1453   RBC 4.03 03/17/2022 1620   RBC 3.73 (L) 08/31/2021 1453   HGB 13.0 03/17/2022 1620   HCT 38.7 03/17/2022 1620   PLT 134 (L) 03/17/2022 1620   MCV 96 03/17/2022 1620   MCH 32.3 03/17/2022 1620   MCH 32.2 08/31/2021 1453   MCHC 33.6 03/17/2022 1620   MCHC 34.0 08/31/2021 1453   RDW 13.5 03/17/2022 1620   LYMPHSABS 0.9 03/17/2022 1620   MONOABS 0.4 08/31/2021 1453   EOSABS 0.1 03/17/2022 1620   BASOSABS 0.0 03/17/2022 1620     Chest Imaging:   Pulmonary Functions Testing Results:     Latest Ref Rng & Units 09/28/2022    1:44 PM  PFT Results  FVC-Pre L 3.13  P  FVC-Predicted Pre % 98  P  FVC-Post L 3.10  P  FVC-Predicted Post % 97  P  Pre FEV1/FVC % % 78  P  Post FEV1/FCV % % 77  P  FEV1-Pre L 2.44  P  FEV1-Predicted Pre % 99  P  FEV1-Post L 2.37  P  DLCO uncorrected ml/min/mmHg 21.88  P  DLCO UNC% % 109  P  DLCO corrected ml/min/mmHg 21.88  P  DLCO COR %Predicted % 109  P  DLVA Predicted % 106  P  TLC L 5.56  P  TLC % Predicted % 110  P  RV % Predicted % 84  P    P Preliminary result    FeNO:   Pathology:   Echocardiogram:   Heart Catheterization:     Assessment & Plan:     ICD-10-CM   1. Wheezing  R06.2 CBC w/Diff    REGIONAL PANEL 2    IgE    Ambulatory Referral for Lung Cancer Scre    2. Tobacco use  Z72.0 Ambulatory Referral for Lung Cancer Scre    3. Smoker  F17.200     4. Mucopurulent chronic bronchitis (HCC)  J41.1       Discussion:  This is a 62 year old female morbidly obese, multiple medical comorbidities.  Recurrent mucopurulent chronic bronchitis symptoms.  PFTs were complete today in the office.  They show no evidence of obstruction mildly elevated DLCO.  Never been told that she has asthma but she does have some allergy symptoms.  Makes me wonder if she actually has intermittent asthma symptoms despite COPD.  She does not have any evidence of obstruction on her PFTs.  Plan: Counseled again on smoking cessation please see documentation We talked about shared decision making regarding lung cancer screening. Patient is agreeable to proceed with lung cancer screening CT. Continue Breztri inhaler regimen. Continue albuterol as needed. Return to clinic in 8 weeks after labs are complete and to see how she is doing on her new inhaler regimen. If she still having breakthrough symptoms despite inhaler regimen we could consider biologic evaluation based on lab work findings.  RTC in 8 weeks.    Current Outpatient Medications:     albuterol (PROVENTIL) (2.5 MG/3ML) 0.083% nebulizer solution, Take 3 mLs (2.5 mg total) by nebulization every 6 (six) hours as needed for wheezing or shortness of breath., Disp: 75 mL, Rfl: 12   albuterol (VENTOLIN HFA) 108 (90 Base) MCG/ACT inhaler, Inhale 2 puffs into the lungs every 6 (six) hours as needed for wheezing or shortness of breath., Disp: 8 g, Rfl: 6   alendronate (FOSAMAX) 70 MG tablet, Take 70 mg by mouth once a week. Take with a full glass of water on  an empty stomach., Disp: , Rfl:    ALPRAZolam (XANAX) 0.5 MG tablet, Take 1 tablet (0.5 mg total) by mouth at bedtime as needed for anxiety., Disp: 30 tablet, Rfl: 5   aspirin EC 81 MG tablet, Take 81 mg by mouth every other day., Disp: , Rfl:    Biotin 16109 MCG TABS, Take by mouth daily. , Disp: , Rfl:    brompheniramine-pseudoephedrine-DM 30-2-10 MG/5ML syrup, Take 5 mLs by mouth 3 (three) times daily as needed., Disp: , Rfl:    Budeson-Glycopyrrol-Formoterol (BREZTRI AEROSPHERE) 160-9-4.8 MCG/ACT AERO, Inhale 2 puffs into the lungs in the morning and at bedtime., Disp: 3 each, Rfl: 3   buPROPion (WELLBUTRIN SR) 150 MG 12 hr tablet, Take 150 mg by mouth 2 (two) times daily. , Disp: , Rfl:    busPIRone (BUSPAR) 15 MG tablet, Take 1 tablet (15 mg total) by mouth 2 (two) times daily., Disp: 180 tablet, Rfl: 3   cholecalciferol (VITAMIN D) 1000 UNITS tablet, Take 5,000 Units by mouth daily., Disp: , Rfl:    Cladribine (MAVENCLAD, 9 TABS, PO), Take by mouth., Disp: , Rfl:    estradiol (CLIMARA - DOSED IN MG/24 HR) 0.0375 mg/24hr patch, estradiol 0.0375 mg/24 hr weekly transdermal patch, Disp: , Rfl:    ezetimibe (ZETIA) 10 MG tablet, Take 10 mg by mouth daily. , Disp: , Rfl:    gabapentin (NEURONTIN) 300 MG capsule, TAKE 1 CAPSULE BY MOUTH THREE TIMES DAILY, Disp: 270 capsule, Rfl: 1   IRON-FOLIC ACID-VIT B12 PO, Take by mouth daily., Disp: , Rfl:    Krill Oil 1000 MG CAPS, Take 1,000 mg by mouth daily. , Disp: , Rfl:    metFORMIN  (GLUCOPHAGE-XR) 500 MG 24 hr tablet, Take 2 tablets by mouth 2 (two) times daily with a meal. At supper, Disp: , Rfl:    Multiple Vitamins-Minerals (ZINC PO), Take 50 mg by mouth daily., Disp: , Rfl:    MYRBETRIQ 50 MG TB24 tablet, Take 1 tablet by mouth once daily, Disp: 90 tablet, Rfl: 0   omeprazole (PRILOSEC) 20 MG capsule, Take 20 mg by mouth daily. , Disp: , Rfl:    phentermine 37.5 MG capsule, Take 1 capsule by mouth in the morning, Disp: 30 capsule, Rfl: 5   predniSONE (DELTASONE) 10 MG tablet, Take 4 tabs by mouth once daily x4 days, then 3 tabs x4 days, 2 tabs x4 days, 1 tab x4 days and stop., Disp: 40 tablet, Rfl: 0   predniSONE (DELTASONE) 50 MG tablet, Take 1 tablet 13 hours, 7 hours and 1 hour (with benadryl) before MRI scan., Disp: 3 tablet, Rfl: 0   selenium 50 MCG TABS tablet, Take 50 mcg by mouth daily., Disp: , Rfl:    Spacer/Aero-Holding Chambers (EQ SPACE CHAMBER ANTI-STATIC) DEVI, USE AS DIRECTED WITH  INHALERS, Disp: 1 each, Rfl: 0   TURMERIC PO, Take 1 capsule by mouth every morning. +Blackpepper 600mg /5mg , Disp: , Rfl:    varenicline (CHANTIX CONTINUING MONTH PAK) 1 MG tablet, Take 1 tablet (1 mg total) by mouth 2 (two) times daily., Disp: 60 tablet, Rfl: 0   venlafaxine XR (EFFEXOR-XR) 150 MG 24 hr capsule, Take 2 capsules by mouth daily., Disp: , Rfl:    zinc gluconate 50 MG tablet, Take 50 mg by mouth daily., Disp: , Rfl:    tiZANidine (ZANAFLEX) 2 MG tablet, Take 1 tablet (2 mg total) by mouth 3 (three) times daily. (Patient not taking: Reported on 04/06/2022), Disp: 90 tablet, Rfl: 11  Current Facility-Administered  Medications:    methylPREDNISolone acetate (DEPO-MEDROL) injection 80 mg, 80 mg, Intramuscular, Once, Beecher Furio, Rachel Bo, DO    Josephine Igo, DO [facility] 09/28/2022 3:30 PM

## 2022-09-28 NOTE — Patient Instructions (Signed)
Full PFT performed today. °

## 2022-09-29 ENCOUNTER — Ambulatory Visit: Payer: Medicare HMO | Admitting: Neurology

## 2022-09-29 LAB — IGE: IgE (Immunoglobulin E), Serum: <2 kU/L (ref <=114)

## 2022-09-29 LAB — CBC WITH DIFFERENTIAL/PLATELET
Basophils Absolute: 0 10*3/uL (ref 0.0–0.1)
Basophils Relative: 0.6 % (ref 0.0–3.0)
Eosinophils Absolute: 0.1 10*3/uL (ref 0.0–0.7)
Eosinophils Relative: 1 % (ref 0.0–5.0)
HCT: 37.6 % (ref 36.0–46.0)
Hemoglobin: 12.7 g/dL (ref 12.0–15.0)
Lymphocytes Relative: 13.2 % (ref 12.0–46.0)
Lymphs Abs: 0.8 10*3/uL (ref 0.7–4.0)
MCHC: 33.8 g/dL (ref 30.0–36.0)
MCV: 95.7 fl (ref 78.0–100.0)
Monocytes Absolute: 0.3 10*3/uL (ref 0.1–1.0)
Monocytes Relative: 5.6 % (ref 3.0–12.0)
Neutro Abs: 4.6 10*3/uL (ref 1.4–7.7)
Neutrophils Relative %: 79.6 % — ABNORMAL HIGH (ref 43.0–77.0)
Platelets: 142 10*3/uL — ABNORMAL LOW (ref 150.0–400.0)
RBC: 3.93 Mil/uL (ref 3.87–5.11)
RDW: 15.3 % (ref 11.5–15.5)
WBC: 5.8 10*3/uL (ref 4.0–10.5)

## 2022-09-30 ENCOUNTER — Ambulatory Visit: Payer: Medicare HMO | Admitting: Pulmonary Disease

## 2022-09-30 ENCOUNTER — Other Ambulatory Visit: Payer: Self-pay | Admitting: *Deleted

## 2022-09-30 ENCOUNTER — Ambulatory Visit
Admission: RE | Admit: 2022-09-30 | Discharge: 2022-09-30 | Disposition: A | Discharge status: Home / Self Care | Payer: Medicare HMO | Source: Ambulatory Visit | Attending: Obstetrics & Gynecology | Admitting: Obstetrics & Gynecology

## 2022-09-30 ENCOUNTER — Telehealth: Payer: Self-pay | Admitting: *Deleted

## 2022-09-30 DIAGNOSIS — N951 Menopausal and female climacteric states: Secondary | ICD-10-CM | POA: Diagnosis not present

## 2022-09-30 DIAGNOSIS — Z8781 Personal history of (healed) traumatic fracture: Secondary | ICD-10-CM | POA: Diagnosis not present

## 2022-09-30 DIAGNOSIS — M810 Age-related osteoporosis without current pathological fracture: Secondary | ICD-10-CM

## 2022-09-30 DIAGNOSIS — F17210 Nicotine dependence, cigarettes, uncomplicated: Secondary | ICD-10-CM

## 2022-09-30 DIAGNOSIS — Z87891 Personal history of nicotine dependence: Secondary | ICD-10-CM

## 2022-09-30 DIAGNOSIS — Z122 Encounter for screening for malignant neoplasm of respiratory organs: Secondary | ICD-10-CM

## 2022-09-30 NOTE — Telephone Encounter (Signed)
Left message for pt to call back to schedule lung screening CT. SDMV was done by Dr Tonia Brooms on 09/28/22.

## 2022-10-03 LAB — REGIONAL PANEL 2
012-IgE Goldenrod: <0.1 kU/L
Amer Sycamore IgE Qn: <0.1 kU/L
Bahia Grass IgE: <0.1 kU/L
Bermuda Grass IgE: <0.1 kU/L
Cocklebur IgE: <0.1 kU/L
Cottonwood IgE: <0.1 kU/L
Elm, American IgE: <0.1 kU/L
Hickory, White IgE: <0.1 kU/L
Johnson Grass IgE: <0.1 kU/L
Kentucky Bluegrass IgE: <0.1 kU/L
Lamb's Quarters IgE: <0.1 kU/L
Maple/Box Elder IgE: <0.1 kU/L
Oak, White IgE: <0.1 kU/L
Pigweed, Rough IgE: <0.1 kU/L
Plantain, English IgE: <0.1 kU/L
Ragweed, Short IgE: <0.1 kU/L
T005-IgE Beech (American): <0.1 kU/L
T010-IgE Walnut: <0.1 kU/L
T012-IgE Willow: <0.1 kU/L
T015-IgE Ash, White: <0.1 kU/L
W004-IgE Ragweed, False: <0.1 kU/L

## 2022-10-04 ENCOUNTER — Other Ambulatory Visit: Payer: Self-pay | Admitting: Neurology

## 2022-10-05 NOTE — Telephone Encounter (Signed)
Last seen on 03/17/22 per note "Continue Phentermine 37.5 mg in the morning for sleepiness, weight loss and fatigue. "  Follow up scheduled on 10/26/22   Last filled on 09/05/22 #30 tablets (30 day supply)  Rx pending to be signed

## 2022-10-12 NOTE — Progress Notes (Signed)
Helen Sandoval, your labs look from recent office visit are normal.   Thanks,  BLI  Josephine Igo, DO [facility] 10/12/2022 3:48 PM

## 2022-10-12 NOTE — Telephone Encounter (Signed)
LDCT has been scheduled for 6/1. Nothing further needed at this time.

## 2022-10-14 ENCOUNTER — Ambulatory Visit: Payer: Medicare HMO | Admitting: Neurology

## 2022-10-22 ENCOUNTER — Ambulatory Visit (HOSPITAL_BASED_OUTPATIENT_CLINIC_OR_DEPARTMENT_OTHER)
Admission: RE | Admit: 2022-10-22 | Discharge: 2022-10-22 | Disposition: A | Discharge status: Home / Self Care | Payer: Medicare HMO | Source: Ambulatory Visit | Attending: Acute Care | Admitting: Acute Care

## 2022-10-22 DIAGNOSIS — Z122 Encounter for screening for malignant neoplasm of respiratory organs: Secondary | ICD-10-CM | POA: Insufficient documentation

## 2022-10-22 DIAGNOSIS — Z87891 Personal history of nicotine dependence: Secondary | ICD-10-CM

## 2022-10-22 DIAGNOSIS — F17210 Nicotine dependence, cigarettes, uncomplicated: Secondary | ICD-10-CM | POA: Diagnosis not present

## 2022-10-24 ENCOUNTER — Other Ambulatory Visit: Payer: Self-pay | Admitting: Pulmonary Disease

## 2022-10-24 DIAGNOSIS — Z79890 Hormone replacement therapy (postmenopausal): Secondary | ICD-10-CM | POA: Diagnosis not present

## 2022-10-24 DIAGNOSIS — M810 Age-related osteoporosis without current pathological fracture: Secondary | ICD-10-CM | POA: Diagnosis not present

## 2022-10-26 ENCOUNTER — Encounter: Payer: Self-pay | Admitting: Neurology

## 2022-10-26 ENCOUNTER — Ambulatory Visit: Payer: Medicare HMO | Admitting: Neurology

## 2022-10-26 ENCOUNTER — Telehealth: Payer: Self-pay | Admitting: Neurology

## 2022-10-26 ENCOUNTER — Other Ambulatory Visit: Payer: Self-pay

## 2022-10-26 VITALS — BP 121/85 | HR 97 | Ht 64.0 in | Wt 198.8 lb

## 2022-10-26 DIAGNOSIS — R5383 Other fatigue: Secondary | ICD-10-CM | POA: Diagnosis not present

## 2022-10-26 DIAGNOSIS — G35 Multiple sclerosis: Secondary | ICD-10-CM | POA: Diagnosis not present

## 2022-10-26 DIAGNOSIS — F17210 Nicotine dependence, cigarettes, uncomplicated: Secondary | ICD-10-CM

## 2022-10-26 DIAGNOSIS — M7062 Trochanteric bursitis, left hip: Secondary | ICD-10-CM | POA: Diagnosis not present

## 2022-10-26 DIAGNOSIS — Z122 Encounter for screening for malignant neoplasm of respiratory organs: Secondary | ICD-10-CM

## 2022-10-26 DIAGNOSIS — F32A Depression, unspecified: Secondary | ICD-10-CM | POA: Diagnosis not present

## 2022-10-26 DIAGNOSIS — F419 Anxiety disorder, unspecified: Secondary | ICD-10-CM | POA: Diagnosis not present

## 2022-10-26 DIAGNOSIS — G4733 Obstructive sleep apnea (adult) (pediatric): Secondary | ICD-10-CM | POA: Diagnosis not present

## 2022-10-26 DIAGNOSIS — Z87891 Personal history of nicotine dependence: Secondary | ICD-10-CM

## 2022-10-26 DIAGNOSIS — Z79899 Other long term (current) drug therapy: Secondary | ICD-10-CM | POA: Diagnosis not present

## 2022-10-26 DIAGNOSIS — E119 Type 2 diabetes mellitus without complications: Secondary | ICD-10-CM | POA: Diagnosis not present

## 2022-10-26 DIAGNOSIS — R269 Unspecified abnormalities of gait and mobility: Secondary | ICD-10-CM

## 2022-10-26 MED ORDER — PREDNISONE 50 MG PO TABS: ORAL_TABLET | ORAL | 0 refills | Status: DC

## 2022-10-26 NOTE — Telephone Encounter (Signed)
Helen Sandoval: [id number] exp. 10/26/22-11/25/22 sent to GI [phone]

## 2022-10-26 NOTE — Progress Notes (Signed)
[facility]  PATIENT: Helen Sandoval DOB: April 18, 1961  REFERRING DOCTOR OR PCP:  Anselm Pancoast. ([facility])   Lynn Ito (new PCP at [facility]) SOURCE: patient and records from [facility]  _________________________________   HISTORICAL  CHIEF COMPLAINT:  Chief Complaint  Patient presents with   Follow-up    Pt in room 10. Here for MS follow up on Bipap.  Pt reports left leg weakness, when bending it hard to get back up. Bumps into things at home, reports memory worse short term and long term. Pt said incontinence of urine and bowels for about 6 weeks now. Tremors are back as well. Pt said bipap is doing great.     HISTORY OF PRESENT ILLNESS:  Helen Sandoval is a 62 yo woman who was diagnosed with relapsing remitting MS in 2015.     Update 10/26/2022 She completed the second year of Mavenclad in April 2021.   Labs 08/31/2021 showed lymphocyte count = 0.6 (has been 0.5 x 0.6 times several years) and  Platelts have been low at +/- 100 since Mavenclad (not typical s.e.)   LFTs were okay with minimally elevated ALT.     After the first Mavenclad, she did well but lymphocyte count was low at 0.1.  Therefore, she did Valtrex for few months.  They bounced back to 0.7 before the second year of Mavenclad.     Due to the persistent lymphopenia and low platelet count, she was referred to hematology.   Her most recent lymphocyte count from 09/28/2022 was low normal at 0.8.  She also has mildly low platelet count of 142, improved compared to a couple years ago  She reports feeling weaker in her left leg and also has more pain in the left hip.  If she squats she has trouble getting up..  When she shows me where the pain is, she points to the region of the trochanteric bursa.  She notes that area is tender if she pushes  She feels more off balanced and is bumping into items more.  Her legs feel a bit weak, similar to last year.  She notes some dysesthesias helped by  gabapentin.  She feels her tremors are doing worse.  It is both hands, worse on right and a little worse with intention.   She was started on a triple inhaler containing formoterol in March.   She has wwheezing and is undergoing evaluation for possible COPD.    She started Chantix for smoking cessation.  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  Her mother also had tremors.  She feels her memory and verbal processing are doing worse.   She has more stress with her mother's health (recurrent colon cancer felt to be terminal)  At bedtime she takes melatonin and flexeril 10 mg which is helping her sleep  Incontinence (bladder and bowel) is doing worse.   Myrbetriq has helped more than oxybutynin.  She has not tried combination.  She does note her metformin dose was doubled around January.   Trulicity has also been discussed  Vision is unchanged.     She is on BiPAP 13/8 (pressures will reduce) and has excellent compliance on the d/l though AHI was good at 2.0 and had 90% compliance.       She has lost weight after being diagnosed  with NIDDM type 2 .  She is now on metformin.  Her HgbA1c is reportedly better  She has NAFLD (Fatty liver).  She has diagnosis of  T2 NIDDM.     MS History:    In 2008, she had headaches and poor balance and was told the MRI had some white matter foci at that time.    She had spinal stenosis and needed surgery in 2014.  The surgeon felt her gait and other issues were decreased out of proportion to the extent of spine disease and referred her to Neurology.   Besides gait issues, she also had double vision, blurry vision, cognitive difficulty and incontinence in early 2015. She saw Dr. Vernie Ammons in . An MRI of the brain, CSF and visual evoked potentials were consistent with multiple sclerosis.    Initially, she was placed on Tecfidera. Due to progression with more neurologic symptoms, she was switched to Tysabri.   Her last MRI was done 10/2014 but we do not have the  images.    Imaging: MRI brain 05/14/2020 shows T2/FLAIR hyperintense foci in the hemispheres, pons and left basal ganglia.  Most of the foci are nonspecific.  A few are periventricular and radially oriented.  The focus in the left basal ganglia could also represent ischemic sequela.    None of the foci appear to be acute and they do not enhance.    There are no new lesions compared with 05/12/2018 MRI.  MRI of the cervical spine shows 1 or 2 small subtle foci within the spinal cord adjacent to C4-C5 and C6.  These have been seen on previous MRIs.  Though nonspecific, this is consistent with a chronic demyelinating plaque.  Minimal chronic compressive myelopathic signal cannot be ruled out.  There are no new lesions and no enhancing lesions.   Prior C4-C6 ACDF.  There is no nerve root compression or spinal stenosis at these levels.   Mild spinal stenosis and mild foraminal narrowing at C3-C4 and C6-C7 that does not lead to any nerve root compression.  Degenerative changes are stable compared to the 2020 MRI.  MRI of the brain 05/19/2021 showed no new lesions.  MRI of the cervical spine 05/19/2021 showed no new lesions.   REVIEW OF SYSTEMS: Constitutional: No fevers, chills, sweats, or change in appetite.   She notes fatigue Eyes: Reports visual changes and double vision.  No eye pain Ear, nose and throat: No hearing loss, ear pain, nasal congestion, sore throat Cardiovascular: No chest pain, palpitations Respiratory:  No shortness of breath at rest or with exertion.   She has snoring and coughing but no wheezes GastrointestinaI: No nausea, vomiting, abdominal pain.  She notes diarrhea and some fecal incontinence Genitourinary:  No dysuria, urinary retention or frequency. She has had some incontinence Musculoskeletal:  No neck pain, back pain Integumentary: No rash, pruritus, skin lesions Neurological: as above Psychiatric: Some depression at this time.  Some anxiety Endocrine: No palpitations,  diaphoresis, change in appetite, change in weigh or increased thirst Hematologic/Lymphatic:  No anemia, purpura, petechiae. Allergic/Immunologic: No itchy/runny eyes, nasal congestion, recent allergic reactions, rashes  ALLERGIES: Allergies  Allergen Reactions   Clindamycin Anaphylaxis   Indomethacin Hives   Levofloxacin Hives   Penicillins Anaphylaxis   Penicillins Cross Reactors Anaphylaxis   Sulfa Antibiotics Swelling    Blister (steven john syndrome)   Levaquin [Levofloxacin Hemihydrate] Hives   Lipitor  [Atorvastatin Calcium]    Multihance [Gadobenate] Hives    Pt having hives/ rash on neck and chest    Sulfamethoxazole-Trimethoprim    Zocor [Simvastatin] Other (See Comments)    Other reaction(s): MUSCLE PAIN Muscle aches    HOME MEDICATIONS:  Current Outpatient Medications:    albuterol (PROVENTIL) (2.5 MG/3ML) 0.083% nebulizer solution, Take 3 mLs (2.5 mg total) by nebulization every 6 (six) hours as needed for wheezing or shortness of breath., Disp: 75 mL, Rfl: 12   albuterol (VENTOLIN HFA) 108 (90 Base) MCG/ACT inhaler, Inhale 2 puffs into the lungs every 6 (six) hours as needed for wheezing or shortness of breath., Disp: 8 g, Rfl: 6   alendronate (FOSAMAX) 70 MG tablet, Take 70 mg by mouth once a week. Take with a full glass of water on an empty stomach., Disp: , Rfl:    ALPRAZolam (XANAX) 0.5 MG tablet, Take 1 tablet (0.5 mg total) by mouth at bedtime as needed for anxiety., Disp: 30 tablet, Rfl: 5   aspirin EC 81 MG tablet, Take 81 mg by mouth every other day., Disp: , Rfl:    Biotin 01027 MCG TABS, Take by mouth daily. , Disp: , Rfl:    brompheniramine-pseudoephedrine-DM 30-2-10 MG/5ML syrup, Take 5 mLs by mouth 3 (three) times daily as needed., Disp: , Rfl:    Budeson-Glycopyrrol-Formoterol (BREZTRI AEROSPHERE) 160-9-4.8 MCG/ACT AERO, Inhale 2 puffs into the lungs in the morning and at bedtime., Disp: 3 each, Rfl: 3   buPROPion (WELLBUTRIN SR) 150 MG 12 hr tablet,  Take 150 mg by mouth 2 (two) times daily. , Disp: , Rfl:    busPIRone (BUSPAR) 15 MG tablet, Take 1 tablet (15 mg total) by mouth 2 (two) times daily., Disp: 180 tablet, Rfl: 3   cholecalciferol (VITAMIN D) 1000 UNITS tablet, Take 5,000 Units by mouth daily., Disp: , Rfl:    Cladribine (MAVENCLAD, 9 TABS, PO), Take by mouth., Disp: , Rfl:    cyclobenzaprine (FLEXERIL) 10 MG tablet, Take 10 mg by mouth at bedtime., Disp: , Rfl:    estradiol (CLIMARA - DOSED IN MG/24 HR) 0.0375 mg/24hr patch, estradiol 0.0375 mg/24 hr weekly transdermal patch, Disp: , Rfl:    ezetimibe (ZETIA) 10 MG tablet, Take 10 mg by mouth daily. , Disp: , Rfl:    gabapentin (NEURONTIN) 300 MG capsule, TAKE 1 CAPSULE BY MOUTH THREE TIMES DAILY, Disp: 270 capsule, Rfl: 1   IRON-FOLIC ACID-VIT B12 PO, Take by mouth daily., Disp: , Rfl:    Krill Oil 1000 MG CAPS, Take 1,000 mg by mouth daily. , Disp: , Rfl:    Melatonin 10 MG TABS, Take 10 mg by mouth daily., Disp: , Rfl:    metFORMIN (GLUCOPHAGE-XR) 500 MG 24 hr tablet, Take 2 tablets by mouth 2 (two) times daily with a meal. At supper, Disp: , Rfl:    Multiple Vitamins-Minerals (ZINC PO), Take 50 mg by mouth daily., Disp: , Rfl:    MYRBETRIQ 50 MG TB24 tablet, Take 1 tablet by mouth once daily, Disp: 90 tablet, Rfl: 0   omeprazole (PRILOSEC) 20 MG capsule, Take 20 mg by mouth daily. , Disp: , Rfl:    phentermine 37.5 MG capsule, Take 1 capsule by mouth in the morning, Disp: 30 capsule, Rfl: 0   predniSONE (DELTASONE) 10 MG tablet, Take 4 tabs by mouth once daily x4 days, then 3 tabs x4 days, 2 tabs x4 days, 1 tab x4 days and stop., Disp: 40 tablet, Rfl: 0   predniSONE (DELTASONE) 50 MG tablet, Take 1 tablet 13 hours, 7 hours and 1 hour (with benadryl) before MRI scan., Disp: 3 tablet, Rfl: 0   selenium 50 MCG TABS tablet, Take 50 mcg by mouth daily., Disp: , Rfl:    Spacer/Aero-Holding Chambers (EQ SPACE  CHAMBER ANTI-STATIC) DEVI, USE AS DIRECTED WITH  INHALERS, Disp: 1 each,  Rfl: 0   TURMERIC PO, Take 1 capsule by mouth every morning. +Blackpepper 600mg /5mg , Disp: , Rfl:    varenicline (CHANTIX) 1 MG tablet, Take 1 tablet by mouth twice daily, Disp: 60 tablet, Rfl: 0   venlafaxine XR (EFFEXOR-XR) 150 MG 24 hr capsule, Take 2 capsules by mouth daily., Disp: , Rfl:    zinc gluconate 50 MG tablet, Take 50 mg by mouth daily., Disp: , Rfl:   Current Facility-Administered Medications:    methylPREDNISolone acetate (DEPO-MEDROL) injection 80 mg, 80 mg, Intramuscular, Once, Icard, Rachel Bo, DO  PAST MEDICAL HISTORY: Past Medical History:  Diagnosis Date   Ankle fracture    Diabetes (HCC)    Fatty liver    Osteopenia    Osteoporosis    Scarlet fever    Shingles    Sleep apnea    Spina bifida    Splenomegaly    Thrombocytopenia (HCC)    Vitamin D deficiency     PAST SURGICAL HISTORY: Past Surgical History:  Procedure Laterality Date   ABDOMINAL HYSTERECTOMY     fibroids and heavy bleeding   BREAST BIOPSY Right    20+ yrs ago   CERVICAL FUSION      FAMILY HISTORY: Family History  Problem Relation Age of Onset   Bladder Cancer Mother    Colon cancer Mother    Colon cancer Father        diagnosed less than age of 17.    Bladder Cancer Maternal Grandmother    Leukemia Maternal Grandmother     SOCIAL HISTORY:  Social History   Socioeconomic History   Marital status: Divorced    Spouse name: Not on file   Number of children: Not on file   Years of education: Not on file   Highest education level: Not on file  Occupational History   Not on file  Tobacco Use   Smoking status: Some Days   Smokeless tobacco: Never   Tobacco comments:    1 cigarrettes per day. 09/28/2022 Tay  Substance and Sexual Activity   Alcohol use: Yes    Comment: occasional use. a few times a year.    Drug use: No   Sexual activity: Not on file  Other Topics Concern   Not on file  Social History Narrative   Not on file   Social Determinants of Health    Financial Resource Strain: Not on file  Food Insecurity: Not on file  Transportation Needs: Not on file  Physical Activity: Not on file  Stress: Not on file  Social Connections: Not on file  Intimate Partner Violence: Not on file     PHYSICAL EXAM  Vitals:   10/26/22 1422  BP: 121/85  Pulse: 97  Weight: 198 lb 12.8 oz (90.2 kg)  Height: 5\' 4"  (1.626 m)    Body mass index is 34.12 kg/m.   General: The patient is well-developed and well-nourished and in no acute distress  Neck: The neck is supple with good ROM and nontender.    Musculoskeletal:  She has tenderness over the trochanteric bursa of the left hip.  No tenderness over SI joint or piriformis muscles  Neurologic Exam  Mental status: The patient is alert and oriented x 3 at the time of the examination.  During the interaction today, she has apparent normal recent and remote memory, with an apparently normal attention span and concentration ability.   Speech is normal.  Cranial nerves: Extraocular movements appear normal.  Facial strength was normal.  Trapezius strength is normal.  No dysarthria is noted.  Hearing appears normal and symmetric.  Motor: She has a low amplitude fast tremor in the hands, increased compared to her last visit.  Tremor worsens with intention such as writing.  Muscle bulk is normal.   Tone is normal. Strength is  5 / 5 in all 4 extremities.   Sensory: She reports slightly reduced sensation to vibration in the right leg    Coordination:  finger-nose-finger is performed well.  Heel-to-shin is performed well.  Gait and station: Station is normal.  She has a mildly reduced stride..  The tandem gait is wide..  Romberg sign is negative.  Reflexes: Deep tendon reflexes are symmetric and 2+ arms and 3+ knees with spread.   There is no ankle clonus..    ASSESSMENT AND PLAN    1. Multiple sclerosis (HCC)   2. High risk medication use   3. Gait abnormality   4. Other fatigue   5. OSA  treated with BiPAP   6. Anxiety and depression      1.  She successfully completed her second course of Mavenclad April 2021.    Last MRI in 2022 showed no new lesions.  Due to new symptoms we will check MRI of the brain and cervical spine and consider restarting a disease modifying therapy if any activity.    2.   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  Worsening tremor might be related to inhaler and Chantix.   Not a beta blocker candidate due to wheezing/IDDM.   If the tremor gets a lot worse we could consider Mysoline or a benzodiazepine 3.   Continue BiPAP at pressure to 13/8.  She uses it nightly.  Advised to try to lose some weight.     D/L with good compliance and efficacy.    Continue night time meds for insomnia 4.   Continue Phentermine 37.5 mg in the morning for sleepiness, weight loss and fatigue.    5.   Left trochanteric bursa injection with 40 mg Depo-Medrol in 3 cc Marcaine using sterile technique.  She tolerated the injection well.  No complications.  Pain was better afterwards.   6.  Fax the labs to Dr. Juliene Pina [facility])    fax [phone] Return in 6 months or sooner if there are new or worsening neurologic symptoms.  43-minute office visit with the majority of the time spent face-to-face for history and physical, discussion/counseling and decision-making.  Additional time with record review and documentation.  This visit is part of a comprehensive longitudinal care medical relationship regarding the patients primary diagnosis of MS and related concerns.   Kamyrah Feeser A. Epimenio Foot, MD, PhD 10/26/2022, 3:15 PM Certified in Neurology, Clinical Neurophysiology, Sleep Medicine, Pain Medicine and Neuroimaging . [facility] [address] [place], [place] [address] [phone]

## 2022-10-27 LAB — CBC WITH DIFFERENTIAL/PLATELET
Basophils Absolute: 0 10*3/uL (ref 0.0–0.2)
Basos: 0 %
EOS (ABSOLUTE): 0.1 10*3/uL (ref 0.0–0.4)
Eos: 1 %
Hematocrit: 38.6 % (ref 34.0–46.6)
Hemoglobin: 13 g/dL (ref 11.1–15.9)
Immature Grans (Abs): 0 10*3/uL (ref 0.0–0.1)
Immature Granulocytes: 1 %
Lymphocytes Absolute: 0.7 10*3/uL (ref 0.7–3.1)
Lymphs: 14 %
MCH: 31.6 pg (ref 26.6–33.0)
MCHC: 33.7 g/dL (ref 31.5–35.7)
MCV: 94 fL (ref 79–97)
Monocytes Absolute: 0.3 10*3/uL (ref 0.1–0.9)
Monocytes: 6 %
Neutrophils Absolute: 3.9 10*3/uL (ref 1.4–7.0)
Neutrophils: 78 %
Platelets: 129 10*3/uL — ABNORMAL LOW (ref 150–450)
RBC: 4.11 x10E6/uL (ref 3.77–5.28)
RDW: 13.6 % (ref 11.7–15.4)
WBC: 5.1 10*3/uL (ref 3.4–10.8)

## 2022-10-27 LAB — COMPREHENSIVE METABOLIC PANEL
ALT: 37 IU/L — ABNORMAL HIGH (ref 0–32)
AST: 27 IU/L (ref 0–40)
Albumin/Globulin Ratio: 2.6 — ABNORMAL HIGH (ref 1.2–2.2)
Albumin: 4.7 g/dL (ref 3.9–4.9)
Alkaline Phosphatase: 72 IU/L (ref 44–121)
BUN/Creatinine Ratio: 21 (ref 12–28)
BUN: 19 mg/dL (ref 8–27)
Bilirubin Total: 0.6 mg/dL (ref 0.0–1.2)
CO2: 26 mmol/L (ref 20–29)
Calcium: 9.3 mg/dL (ref 8.7–10.3)
Chloride: 102 mmol/L (ref 96–106)
Creatinine, Ser: 0.92 mg/dL (ref 0.57–1.00)
Globulin, Total: 1.8 g/dL (ref 1.5–4.5)
Glucose: 98 mg/dL (ref 70–99)
Potassium: 4.1 mmol/L (ref 3.5–5.2)
Sodium: 141 mmol/L (ref 134–144)
Total Protein: 6.5 g/dL (ref 6.0–8.5)
eGFR: 70 mL/min/{1.73_m2} (ref >59)

## 2022-10-27 LAB — THYROID PANEL WITH TSH
Free Thyroxine Index: 1.6 (ref 1.2–4.9)
T3 Uptake Ratio: 25 % (ref 24–39)
T4, Total: 6.3 ug/dL (ref 4.5–12.0)
TSH: 3.08 u[IU]/mL (ref 0.450–4.500)

## 2022-11-06 ENCOUNTER — Other Ambulatory Visit: Payer: Self-pay | Admitting: Neurology

## 2022-11-07 ENCOUNTER — Other Ambulatory Visit: Payer: Self-pay | Admitting: Neurology

## 2022-11-11 ENCOUNTER — Other Ambulatory Visit: Payer: Self-pay | Admitting: Neurology

## 2022-11-14 NOTE — Telephone Encounter (Signed)
Rx filled per last office visit note. 

## 2022-11-20 ENCOUNTER — Other Ambulatory Visit: Payer: Self-pay | Admitting: Pulmonary Disease

## 2022-12-04 ENCOUNTER — Other Ambulatory Visit: Payer: Self-pay | Admitting: Neurology

## 2022-12-05 ENCOUNTER — Encounter: Payer: Self-pay | Admitting: Nurse Practitioner

## 2022-12-05 ENCOUNTER — Ambulatory Visit: Payer: Medicare HMO | Admitting: Nurse Practitioner

## 2022-12-05 VITALS — BP 122/80 | HR 92 | Ht 64.0 in | Wt 200.0 lb

## 2022-12-05 DIAGNOSIS — J42 Unspecified chronic bronchitis: Secondary | ICD-10-CM

## 2022-12-05 DIAGNOSIS — Z720 Tobacco use: Secondary | ICD-10-CM | POA: Insufficient documentation

## 2022-12-05 MED ORDER — BREZTRI AEROSPHERE 160-9-4.8 MCG/ACT IN AERO: 2.0000 | INHALATION_SPRAY | Freq: Two times a day (BID) | RESPIRATORY_TRACT | 0 refills | Status: DC

## 2022-12-05 NOTE — Patient Instructions (Addendum)
Continue Albuterol inhaler 2 puffs or 3 mL neb every 6 hours as needed for shortness of breath or wheezing. Notify if symptoms persist despite rescue inhaler/neb use. Continue Breztri 2 puffs Twice daily. Brush tongue and rinse mouth afterwards Continue chantix 1 mg Twice daily  Congratulations on quitting smoking!!  Complete patient assistance for Breztri and return to [place].  Follow up in 4 months with Dr. Tonia Brooms or Philis Nettle. If symptoms worsen, please contact office for sooner follow up or seek emergency care.

## 2022-12-05 NOTE — Assessment & Plan Note (Addendum)
Quit 2 weeks ago. See above. She will continue Chantix and we will re-evaluate at follow up. No significant side effects. Aware of side effect profile. LDCT chest Lung RADS 1. Follow up in June 2025.

## 2022-12-05 NOTE — Assessment & Plan Note (Signed)
Chronic bronchitis/mild emphysematous changes. Likely with reactive airway/asthmatic component. She has had clinical improvement since starting on triple therapy regimen with Breztri. Appears compensated. She is in the coverage gap currently - samples provided today as well as [place] patient assistance application. She will let us know before she runs out of her medicine. Action plan in place. Encouraged to remain smoke free and work on graded exercises.  Patient Instructions  Continue Albuterol inhaler 2 puffs or 3 mL neb every 6 hours as needed for shortness of breath or wheezing. Notify if symptoms persist despite rescue inhaler/neb use. Continue Breztri 2 puffs Twice daily. Brush tongue and rinse mouth afterwards Continue chantix 1 mg Twice daily  Congratulations on quitting smoking!!  Complete patient assistance for Breztri and return to [place].  Follow up in 4 months with Dr. Tonia Brooms or Philis Nettle. If symptoms worsen, please contact office for sooner follow up or seek emergency care.

## 2022-12-05 NOTE — Progress Notes (Signed)
@Patient  ID: Helen Sandoval, female    DOB: 05/01/1961, 62 y.o.   MRN: [id number]  Chief Complaint  Patient presents with   Follow-up    2 mo f/u for wheezing. States she has noticed an increase in wheezing especially with the humidity and heat. Markus Daft is over $420.     Referring provider: Georgianne Fick, MD  HPI: 62 year old female, active smoker followed for emphysema and chronic bronchitis.  She is a patient of Dr. Myrlene Broker and last seen in office 09/28/2022.  Past medical history significant for OSA on BiPAP, multiple sclerosis, anxiety and depression, HLD  TEST/EVENTS:  09/28/2022 PFT: FVC 98, FEV1 97, ratio 77, TLC 110, DLCOcor 109.  No BD.  Normal PFTs 10/22/2022 LDCT chest: Atherosclerosis.  No LAD.  No suspicious nodules or masses.  Mild diffuse bronchial wall thickening with very mild centrilobular and paraseptal emphysema.  Evidence of hepatic steatosis.  09/28/2022: OV with Dr. Tonia Brooms.  Initially seen for chest tightness and shortness of breath with ongoing wheezing.  Chronic sputum production.  Multiple bouts of recurrent bronchitis.  Started on [place].  Symptoms have improved.  Feeling much better.  No longer having chest tightness cough or wheezing.  Pulmonary function testing revealed a reduced ERV, normal ratio, normal spirometry and mildly elevated DLCO.  No significant bronchodilator response.  Never been told that she has asthma but does have some allergy symptoms.  Question intermittent asthma symptoms.  Will continue her on Breztri.  Referred to lung cancer screening program.  Counseled on smoking cessation and started Chantix.   12/05/2022: Today - follow up Patient is today for follow-up.  She has been doing pretty well since she was here last.  Has been having just a little bit more trouble with chest tightness and wheezing now that it is very hot and humid outside.  As long as she is inside, she tends to do just fine.  Feels like she still gets benefit from the [place].   She did have a low-dose lung cancer screening CT without any concerning nodules/masses.  She did have some mild emphysema.  Denies any cough, chest congestion, lower extremity swelling, orthopnea.  Has not had to use her rescue.  She quit smoking 2 weeks ago.  Feels like the Chantix has helped a lot.  Not currently having any cravings.  Allergies  Allergen Reactions   Clindamycin Anaphylaxis   Indomethacin Hives   Levofloxacin Hives   Penicillins Anaphylaxis   Penicillins Cross Reactors Anaphylaxis   Sulfa Antibiotics Swelling    Blister (steven john syndrome)   Levaquin [Levofloxacin Hemihydrate] Hives   Lipitor  [Atorvastatin Calcium]    Multihance [Gadobenate] Hives    Pt having hives/ rash on neck and chest    Sulfamethoxazole-Trimethoprim    Zocor [Simvastatin] Other (See Comments)    Other reaction(s): MUSCLE PAIN Muscle aches    Immunization History  Administered Date(s) Administered   Influenza, Seasonal, Injecte, Preservative Fre 04/01/2009   Influenza,inj,Quad PF,6+ Mos 01/25/2016   Influenza,inj,quad, With Preservative 01/23/2017   Influenza-Unspecified 01/30/2013, 02/20/2014, 06/26/2017, 01/21/2018, 02/14/2018, 03/16/2021   MMR 09/15/2009   PFIZER(Purple Top)SARS-COV-2 Vaccination 07/06/2019, 07/28/2019, 01/14/2020   PPD Test 09/15/2010   Pneumococcal Polysaccharide-23 10/29/2014   Tdap 09/07/2009   Zoster Recombinant(Shingrix) 02/27/2019    Past Medical History:  Diagnosis Date   Ankle fracture    Diabetes (HCC)    Fatty liver    Osteopenia    Osteoporosis    Scarlet fever    Shingles  Sleep apnea    Spina bifida    Splenomegaly    Thrombocytopenia (HCC)    Vitamin D deficiency     Tobacco History: Social History   Tobacco Use  Smoking Status Former   Current packs/day: 0.00   Types: Cigarettes   Quit date: 11/21/2022   Years since quitting: 0.0  Smokeless Tobacco Never  Tobacco Comments   1 cigarrettes per day. 09/28/2022 [place]   Counseling  given: Not Answered Tobacco comments: 1 cigarrettes per day. 09/28/2022 [place]   Outpatient Medications Prior to Visit  Medication Sig Dispense Refill   albuterol (PROVENTIL) (2.5 MG/3ML) 0.083% nebulizer solution Take 3 mLs (2.5 mg total) by nebulization every 6 (six) hours as needed for wheezing or shortness of breath. 75 mL 12   albuterol (VENTOLIN HFA) 108 (90 Base) MCG/ACT inhaler Inhale 2 puffs into the lungs every 6 (six) hours as needed for wheezing or shortness of breath. 8 g 6   alendronate (FOSAMAX) 70 MG tablet Take 70 mg by mouth once a week. Take with a full glass of water on an empty stomach.     ALPRAZolam (XANAX) 0.5 MG tablet Take 1 tablet (0.5 mg total) by mouth at bedtime as needed for anxiety. 30 tablet 5   aspirin EC 81 MG tablet Take 81 mg by mouth every other day.     Biotin 27253 MCG TABS Take by mouth daily.      brompheniramine-pseudoephedrine-DM 30-2-10 MG/5ML syrup Take 5 mLs by mouth 3 (three) times daily as needed.     Budeson-Glycopyrrol-Formoterol (BREZTRI AEROSPHERE) 160-9-4.8 MCG/ACT AERO Inhale 2 puffs into the lungs in the morning and at bedtime. 3 each 3   buPROPion (WELLBUTRIN SR) 150 MG 12 hr tablet Take 150 mg by mouth 2 (two) times daily.      busPIRone (BUSPAR) 15 MG tablet Take 1 tablet (15 mg total) by mouth 2 (two) times daily. 180 tablet 3   cholecalciferol (VITAMIN D) 1000 UNITS tablet Take 5,000 Units by mouth daily.     Cladribine (MAVENCLAD, 9 TABS, PO) Take by mouth.     cyclobenzaprine (FLEXERIL) 10 MG tablet Take 10 mg by mouth at bedtime.     estradiol (CLIMARA - DOSED IN MG/24 HR) 0.0375 mg/24hr patch estradiol 0.0375 mg/24 hr weekly transdermal patch     ezetimibe (ZETIA) 10 MG tablet Take 10 mg by mouth daily.      gabapentin (NEURONTIN) 300 MG capsule TAKE 1 CAPSULE BY MOUTH THREE TIMES DAILY 270 capsule 1   IRON-FOLIC ACID-VIT B12 PO Take by mouth daily.     Krill Oil 1000 MG CAPS Take 1,000 mg by mouth daily.      Melatonin 10 MG TABS  Take 10 mg by mouth daily.     metFORMIN (GLUCOPHAGE-XR) 500 MG 24 hr tablet Take 2 tablets by mouth 2 (two) times daily with a meal. At supper     Multiple Vitamins-Minerals (ZINC PO) Take 50 mg by mouth daily.     MYRBETRIQ 50 MG TB24 tablet Take 1 tablet by mouth once daily 90 tablet 0   omeprazole (PRILOSEC) 20 MG capsule Take 20 mg by mouth daily.      predniSONE (DELTASONE) 50 MG tablet Take 1 tablet 13 hours, 7 hours and 1 hour (with benadryl) before MRI scan. 3 tablet 0   selenium 50 MCG TABS tablet Take 50 mcg by mouth daily.     Spacer/Aero-Holding Chambers (EQ SPACE CHAMBER ANTI-STATIC) DEVI USE AS DIRECTED WITH  INHALERS  1 each 0   TURMERIC PO Take 1 capsule by mouth every morning. +Blackpepper 600mg /5mg      varenicline (CHANTIX) 1 MG tablet Take 1 tablet by mouth twice daily 60 tablet 0   venlafaxine XR (EFFEXOR-XR) 150 MG 24 hr capsule Take 2 capsules by mouth daily.     zinc gluconate 50 MG tablet Take 50 mg by mouth daily.     phentermine 37.5 MG capsule Take 1 capsule by mouth in the morning 30 capsule 0   predniSONE (DELTASONE) 10 MG tablet Take 4 tabs by mouth once daily x4 days, then 3 tabs x4 days, 2 tabs x4 days, 1 tab x4 days and stop. 40 tablet 0   Facility-Administered Medications Prior to Visit  Medication Dose Route Frequency Provider Last Rate Last Admin   methylPREDNISolone acetate (DEPO-MEDROL) injection 80 mg  80 mg Intramuscular Once Icard, Bradley L, DO         Review of Systems:   Constitutional: No weight loss or gain, night sweats, fevers, chills, fatigue, or lassitude. HEENT: No headaches, difficulty swallowing, tooth/dental problems, or sore throat. No sneezing, itching, ear ache, nasal congestion, or post nasal drip CV:  No chest pain, orthopnea, PND, swelling in lower extremities, anasarca, dizziness, palpitations, syncope Resp: +shortness of breath with exertion; chest tightness; rare wheeze. No excess mucus or change in color of mucus. No productive  or non-productive. No hemoptysis. No chest wall deformity GI:  No heartburn, indigestion MSK:  No joint pain or swelling.   Neuro: No dizziness or lightheadedness.  Psych: No depression or anxiety. Mood stable.     Physical Exam:  BP 122/80   Pulse 92   Ht 5\' 4"  (1.626 m)   Wt 200 lb (90.7 kg)   SpO2 96% Comment: on RA  BMI 34.33 kg/m   GEN: Pleasant, interactive, well-appearing; obese; in no acute distress HEENT:  Normocephalic and atraumatic. PERRLA. Sclera white. Nasal turbinates pink, moist and patent bilaterally. No rhinorrhea present. Oropharynx pink and moist, without exudate or edema. No lesions, ulcerations, or postnasal drip.  NECK:  Supple w/ fair ROM. No JVD present. Normal carotid impulses w/o bruits. Thyroid symmetrical with no goiter or nodules palpated. No lymphadenopathy.   CV: RRR, no m/r/g, no peripheral edema. Pulses intact, +2 bilaterally. No cyanosis, pallor or clubbing. PULMONARY:  Unlabored, regular breathing. Clear bilaterally A&P w/o wheezes/rales/rhonchi. No accessory muscle use.  GI: BS present and normoactive. Soft, non-tender to palpation. No organomegaly or masses detected.  MSK: No erythema, warmth or tenderness. Cap refil <2 sec all extrem. No deformities or joint swelling noted.  Neuro: A/Ox3. No focal deficits noted.   Skin: Warm, no lesions or rashe Psych: Normal affect and behavior. Judgement and thought content appropriate.     Lab Results:  CBC    Component Value Date/Time   WBC 5.1 10/26/2022 1523   WBC 5.8 09/28/2022 1539   RBC 4.11 10/26/2022 1523   RBC 3.93 09/28/2022 1539   HGB 13.0 10/26/2022 1523   HCT 38.6 10/26/2022 1523   PLT 129 (L) 10/26/2022 1523   MCV 94 10/26/2022 1523   MCH 31.6 10/26/2022 1523   MCH 32.2 08/31/2021 1453   MCHC 33.7 10/26/2022 1523   MCHC 33.8 09/28/2022 1539   RDW 13.6 10/26/2022 1523   LYMPHSABS 0.7 10/26/2022 1523   MONOABS 0.3 09/28/2022 1539   EOSABS 0.1 10/26/2022 1523   BASOSABS 0.0  10/26/2022 1523    BMET    Component Value Date/Time   NA  141 10/26/2022 1523   K 4.1 10/26/2022 1523   CL 102 10/26/2022 1523   CO2 26 10/26/2022 1523   GLUCOSE 98 10/26/2022 1523   GLUCOSE 154 (H) 03/02/2021 1318   BUN 19 10/26/2022 1523   CREATININE 0.92 10/26/2022 1523   CREATININE 1.02 (H) 03/02/2021 1318   CALCIUM 9.3 10/26/2022 1523   GFRNONAA >60 03/02/2021 1318   GFRAA 75 02/27/2020 1316    BNP No results found for: "BNP"   Imaging:  No results found.  Administration History     None          Latest Ref Rng & Units 09/28/2022    1:44 PM  PFT Results  FVC-Pre L 3.13   FVC-Predicted Pre % 98   FVC-Post L 3.10   FVC-Predicted Post % 97   Pre FEV1/FVC % % 78   Post FEV1/FCV % % 77   FEV1-Pre L 2.44   FEV1-Predicted Pre % 99   FEV1-Post L 2.37   DLCO uncorrected ml/min/mmHg 21.88   DLCO UNC% % 109   DLCO corrected ml/min/mmHg 21.88   DLCO COR %Predicted % 109   DLVA Predicted % 106   TLC L 5.56   TLC % Predicted % 110   RV % Predicted % 84     No results found for: "NITRICOXIDE"      Assessment & Plan:   Bronchitis, chronic (HCC) Chronic bronchitis/mild emphysematous changes. Likely with reactive airway/asthmatic component. She has had clinical improvement since starting on triple therapy regimen with Breztri. Appears compensated. She is in the coverage gap currently - samples provided today as well as [place] patient assistance application. She will let us know before she runs out of her medicine. Action plan in place. Encouraged to remain smoke free and work on graded exercises.  Patient Instructions  Continue Albuterol inhaler 2 puffs or 3 mL neb every 6 hours as needed for shortness of breath or wheezing. Notify if symptoms persist despite rescue inhaler/neb use. Continue Breztri 2 puffs Twice daily. Brush tongue and rinse mouth afterwards Continue chantix 1 mg Twice daily  Congratulations on quitting smoking!!  Complete patient  assistance for Breztri and return to [place].  Follow up in 4 months with Dr. Tonia Brooms or Philis Nettle. If symptoms worsen, please contact office for sooner follow up or seek emergency care.    Tobacco abuse Quit 2 weeks ago. See above. She will continue Chantix and we will re-evaluate at follow up. No significant side effects. Aware of side effect profile. LDCT chest Lung RADS 1. Follow up in June 2025.    I spent 28 minutes of dedicated to the care of this patient on the date of this encounter to include pre-visit review of records, face-to-face time with the patient discussing conditions above, post visit ordering of testing, clinical documentation with the electronic health record, making appropriate referrals as documented, and communicating necessary findings to members of the patients care team.  Noemi Chapel, NP 12/05/2022  Pt aware and understands NP's role.

## 2022-12-06 ENCOUNTER — Encounter: Payer: Self-pay | Admitting: Neurology

## 2022-12-08 ENCOUNTER — Ambulatory Visit
Admission: RE | Admit: 2022-12-08 | Discharge: 2022-12-08 | Disposition: A | Discharge status: Home / Self Care | Payer: Medicare HMO | Source: Ambulatory Visit | Attending: Neurology | Admitting: Neurology

## 2022-12-08 DIAGNOSIS — G35 Multiple sclerosis: Secondary | ICD-10-CM | POA: Diagnosis not present

## 2022-12-08 DIAGNOSIS — F17210 Nicotine dependence, cigarettes, uncomplicated: Secondary | ICD-10-CM | POA: Diagnosis not present

## 2022-12-08 DIAGNOSIS — Z122 Encounter for screening for malignant neoplasm of respiratory organs: Secondary | ICD-10-CM | POA: Diagnosis not present

## 2022-12-08 DIAGNOSIS — Z87891 Personal history of nicotine dependence: Secondary | ICD-10-CM | POA: Diagnosis not present

## 2022-12-08 MED ORDER — GADOPICLENOL 0.5 MMOL/ML IV SOLN
9.0000 mL | Freq: Once | INTRAVENOUS | Status: AC | PRN
  Administered 2022-12-08: 9 mL via INTRAVENOUS

## 2022-12-26 DIAGNOSIS — E782 Mixed hyperlipidemia: Secondary | ICD-10-CM | POA: Diagnosis not present

## 2022-12-26 DIAGNOSIS — N182 Chronic kidney disease, stage 2 (mild): Secondary | ICD-10-CM | POA: Diagnosis not present

## 2022-12-26 DIAGNOSIS — E1165 Type 2 diabetes mellitus with hyperglycemia: Secondary | ICD-10-CM | POA: Diagnosis not present

## 2022-12-27 ENCOUNTER — Other Ambulatory Visit: Payer: Self-pay | Admitting: Pulmonary Disease

## 2023-01-02 DIAGNOSIS — E1122 Type 2 diabetes mellitus with diabetic chronic kidney disease: Secondary | ICD-10-CM | POA: Diagnosis not present

## 2023-01-02 DIAGNOSIS — G729 Myopathy, unspecified: Secondary | ICD-10-CM | POA: Diagnosis not present

## 2023-01-02 DIAGNOSIS — N182 Chronic kidney disease, stage 2 (mild): Secondary | ICD-10-CM | POA: Diagnosis not present

## 2023-01-02 DIAGNOSIS — D696 Thrombocytopenia, unspecified: Secondary | ICD-10-CM | POA: Diagnosis not present

## 2023-01-03 ENCOUNTER — Other Ambulatory Visit: Payer: Self-pay | Admitting: Diagnostic Neuroimaging

## 2023-01-25 ENCOUNTER — Other Ambulatory Visit: Payer: Self-pay | Admitting: Pulmonary Disease

## 2023-02-02 DIAGNOSIS — Z23 Encounter for immunization: Secondary | ICD-10-CM | POA: Diagnosis not present

## 2023-02-11 ENCOUNTER — Other Ambulatory Visit: Payer: Self-pay | Admitting: Neurology

## 2023-02-23 ENCOUNTER — Other Ambulatory Visit: Payer: Self-pay | Admitting: Obstetrics & Gynecology

## 2023-02-23 ENCOUNTER — Other Ambulatory Visit: Payer: Self-pay | Admitting: Neurology

## 2023-02-23 DIAGNOSIS — Z0000 Encounter for general adult medical examination without abnormal findings: Secondary | ICD-10-CM

## 2023-02-24 ENCOUNTER — Other Ambulatory Visit: Payer: Self-pay | Admitting: Pulmonary Disease

## 2023-03-02 ENCOUNTER — Encounter: Payer: Self-pay | Admitting: Neurology

## 2023-03-02 ENCOUNTER — Other Ambulatory Visit: Payer: Self-pay | Admitting: Neurology

## 2023-03-02 MED ORDER — METHYLPREDNISOLONE 4 MG PO TABS: ORAL_TABLET | ORAL | 0 refills | Status: DC

## 2023-03-03 ENCOUNTER — Encounter: Payer: Self-pay | Admitting: Neurology

## 2023-03-06 ENCOUNTER — Other Ambulatory Visit: Payer: Self-pay | Admitting: *Deleted

## 2023-03-06 MED ORDER — METHYLPREDNISOLONE 4 MG PO TABS: ORAL_TABLET | ORAL | 0 refills | Status: DC

## 2023-03-23 ENCOUNTER — Telehealth: Payer: Self-pay | Admitting: Neurology

## 2023-03-23 ENCOUNTER — Other Ambulatory Visit: Payer: Self-pay | Admitting: Neurology

## 2023-03-23 DIAGNOSIS — M51372 Other intervertebral disc degeneration, lumbosacral region with discogenic back pain and lower extremity pain: Secondary | ICD-10-CM

## 2023-03-23 DIAGNOSIS — G35 Multiple sclerosis: Secondary | ICD-10-CM

## 2023-03-23 DIAGNOSIS — R269 Unspecified abnormalities of gait and mobility: Secondary | ICD-10-CM

## 2023-03-23 DIAGNOSIS — Z79899 Other long term (current) drug therapy: Secondary | ICD-10-CM

## 2023-03-23 MED ORDER — CYCLOBENZAPRINE HCL 10 MG PO TABS: 10.0000 mg | ORAL_TABLET | Freq: Every day | ORAL | 3 refills | Status: AC

## 2023-03-23 NOTE — Telephone Encounter (Signed)
Helen Sandoval: [id number] exp. 03/23/23-05/20/23 sent to GI [phone]

## 2023-04-02 ENCOUNTER — Ambulatory Visit
Admission: RE | Admit: 2023-04-02 | Discharge: 2023-04-02 | Disposition: A | Discharge status: Home / Self Care | Payer: Medicare HMO | Source: Ambulatory Visit | Attending: Neurology | Admitting: Neurology

## 2023-04-02 DIAGNOSIS — M51372 Other intervertebral disc degeneration, lumbosacral region with discogenic back pain and lower extremity pain: Secondary | ICD-10-CM

## 2023-04-02 DIAGNOSIS — G35 Multiple sclerosis: Secondary | ICD-10-CM

## 2023-04-02 DIAGNOSIS — R269 Unspecified abnormalities of gait and mobility: Secondary | ICD-10-CM

## 2023-04-02 DIAGNOSIS — Z79899 Other long term (current) drug therapy: Secondary | ICD-10-CM | POA: Diagnosis not present

## 2023-04-03 ENCOUNTER — Ambulatory Visit: Payer: Medicare HMO | Admitting: Nurse Practitioner

## 2023-04-03 ENCOUNTER — Encounter: Payer: Self-pay | Admitting: Nurse Practitioner

## 2023-04-03 VITALS — BP 134/86 | HR 88 | Ht 64.0 in | Wt 190.2 lb

## 2023-04-03 DIAGNOSIS — Z720 Tobacco use: Secondary | ICD-10-CM | POA: Diagnosis not present

## 2023-04-03 DIAGNOSIS — J42 Unspecified chronic bronchitis: Secondary | ICD-10-CM

## 2023-04-03 MED ORDER — WIXELA INHUB 100-50 MCG/ACT IN AEPB
1.0000 | INHALATION_SPRAY | Freq: Two times a day (BID) | RESPIRATORY_TRACT | 12 refills | Status: DC
Start: 2023-04-03 — End: 2023-10-20

## 2023-04-03 NOTE — Patient Instructions (Addendum)
Continue Albuterol inhaler 2 puffs or 3 mL neb every 6 hours as needed for shortness of breath or wheezing. Notify if symptoms persist despite rescue inhaler/neb use. Continue chantix 1 mg Twice daily  Stop Breztri. Start Wixela 1 puff Twice daily. Brush tongue and rinse mouth afterwards   Attend lung cancer screening CT as scheduled    Follow up in 6 months with Dr. Tonia Brooms or Philis Nettle. If symptoms worsen, please contact office for sooner follow up or seek emergency care.

## 2023-04-03 NOTE — Assessment & Plan Note (Signed)
Encouraged to remain smoke free. She quit around 4 months ago. Will continue Chantix for another 2 months for a total of 6  months of therapy and reassess. Could consider longer duration to avoid relapse. Advised to monitor symptoms and mood on this. Continue lung cancer screening program.

## 2023-04-03 NOTE — Assessment & Plan Note (Signed)
Chronic bronchitis with emphysema on imaging. No formal obstruction on imaging. Receives benefit from inhaler therapy and oral steroids. Markus Daft is expensive. Will trial her on dual therapy with Wixela, which appears to be affordable at $24 a month. Advised her to notify if cough worsens with this. Side effect profile reviewed. Continued smoking cessation. Action plan in place.  Patient Instructions  Continue Albuterol inhaler 2 puffs or 3 mL neb every 6 hours as needed for shortness of breath or wheezing. Notify if symptoms persist despite rescue inhaler/neb use. Continue chantix 1 mg Twice daily  Stop Breztri. Start Wixela 1 puff Twice daily. Brush tongue and rinse mouth afterwards   Attend lung cancer screening CT as scheduled    Follow up in 6 months with Dr. Tonia Brooms or Philis Nettle. If symptoms worsen, please contact office for sooner follow up or seek emergency care.

## 2023-04-03 NOTE — Progress Notes (Signed)
@Patient  ID: Helen Sandoval, female    DOB: December 24, 1960, 62 y.o.   MRN: [id number]  Chief Complaint  Patient presents with   Follow-up    Pt states she has been doing great. Still not smoking, denies any concerns     Referring provider: Georgianne Fick, MD  HPI: 62 year old female, active smoker followed for emphysema and chronic bronchitis.  She is a patient of Dr. Myrlene Broker and last seen in office 12/05/2022 by [facility] NP.  Past medical history significant for OSA on BiPAP, multiple sclerosis, anxiety and depression, HLD  TEST/EVENTS:  09/28/2022 PFT: FVC 98, FEV1 97, ratio 77, TLC 110, DLCOcor 109.  No BD.  Normal PFTs 10/22/2022 LDCT chest: Atherosclerosis.  No LAD.  No suspicious nodules or masses.  Mild diffuse bronchial wall thickening with very mild centrilobular and paraseptal emphysema.  Evidence of hepatic steatosis.  09/28/2022: OV with Dr. Tonia Brooms.  Initially seen for chest tightness and shortness of breath with ongoing wheezing.  Chronic sputum production.  Multiple bouts of recurrent bronchitis.  Started on [place].  Symptoms have improved.  Feeling much better.  No longer having chest tightness cough or wheezing.  Pulmonary function testing revealed a reduced ERV, normal ratio, normal spirometry and mildly elevated DLCO.  No significant bronchodilator response.  Never been told that she has asthma but does have some allergy symptoms.  Question intermittent asthma symptoms.  Will continue her on Breztri.  Referred to lung cancer screening program.  Counseled on smoking cessation and started Chantix.   12/05/2022: Ov with Helen Ledvina NP for follow-up.  She has been doing pretty well since she was here last.  Has been having just a little bit more trouble with chest tightness and wheezing now that it is very hot and humid outside.  As long as she is inside, she tends to do just fine.  Feels like she still gets benefit from the [place].  She did have a low-dose lung cancer screening CT without any  concerning nodules/masses.  She did have some mild emphysema.  Denies any cough, chest congestion, lower extremity swelling, orthopnea.  Has not had to use her rescue.  She quit smoking 2 weeks ago.  Feels like the Chantix has helped a lot.  Not currently having any cravings.  04/03/2023: Today - follow up Patient is today for follow-up.  She has been doing very well since she was here last.  She was using Breztri on a consistent basis, which she found to be helpful; unfortunately her co-pay on this after insurance is very expensive so she stopped using it on a daily basis.  Breathing and cough have been doing okay without this.  She feels like since she quit smoking things been going better for her.  She denies any wheezing, chest congestion.  Still uses her Breztri occasionally when she feels like she is more short winded or has some chest tightness.  It does help when she does this.  She is still on Chantix daily, which she thinks has made a huge difference in her smoking journey.   Allergies  Allergen Reactions   Clindamycin Anaphylaxis   Indomethacin Hives   Levofloxacin Hives   Penicillins Anaphylaxis   Penicillins Cross Reactors Anaphylaxis   Sulfa Antibiotics Swelling    Blister (steven john syndrome)   Levaquin [Levofloxacin Hemihydrate] Hives   Lipitor  [Atorvastatin Calcium]    Multihance [Gadobenate] Hives    Pt having hives/ rash on neck and chest    Sulfamethoxazole-Trimethoprim  Zocor [Simvastatin] Other (See Comments)    Other reaction(s): MUSCLE PAIN Muscle aches    Immunization History  Administered Date(s) Administered   Influenza, Seasonal, Injecte, Preservative Fre 04/01/2009   Influenza,inj,Quad PF,6+ Mos 01/25/2016   Influenza,inj,quad, With Preservative 01/23/2017   Influenza,trivalent, recombinat, inj, PF 02/02/2023   Influenza-Unspecified 01/30/2013, 02/20/2014, 06/26/2017, 01/21/2018, 02/14/2018, 03/16/2021   MMR 09/15/2009   PFIZER(Purple Top)SARS-COV-2  Vaccination 07/06/2019, 07/28/2019, 01/14/2020   PPD Test 09/15/2010   Pfizer(Comirnaty)Fall Seasonal Vaccine 12 years and older 02/02/2023   Pneumococcal Polysaccharide-23 10/29/2014   Tdap 09/07/2009   Zoster Recombinant(Shingrix) 02/27/2019    Past Medical History:  Diagnosis Date   Ankle fracture    Diabetes (HCC)    Fatty liver    Osteopenia    Osteoporosis    Scarlet fever    Shingles    Sleep apnea    Spina bifida    Splenomegaly    Thrombocytopenia (HCC)    Vitamin D deficiency     Tobacco History: Social History   Tobacco Use  Smoking Status Former   Current packs/day: 0.00   Types: Cigarettes   Quit date: 11/21/2022   Years since quitting: 0.3  Smokeless Tobacco Never  Tobacco Comments   1 cigarrettes per day. 09/28/2022 Tay   Counseling given: Not Answered Tobacco comments: 1 cigarrettes per day. 09/28/2022 Tay   Outpatient Medications Prior to Visit  Medication Sig Dispense Refill   albuterol (PROVENTIL) (2.5 MG/3ML) 0.083% nebulizer solution Take 3 mLs (2.5 mg total) by nebulization every 6 (six) hours as needed for wheezing or shortness of breath. 75 mL 12   albuterol (VENTOLIN HFA) 108 (90 Base) MCG/ACT inhaler Inhale 2 puffs into the lungs every 6 (six) hours as needed for wheezing or shortness of breath. 8 g 6   alendronate (FOSAMAX) 70 MG tablet Take 70 mg by mouth once a week. Take with a full glass of water on an empty stomach.     ALPRAZolam (XANAX) 0.5 MG tablet Take 1 tablet (0.5 mg total) by mouth at bedtime as needed for anxiety. 30 tablet 5   aspirin EC 81 MG tablet Take 81 mg by mouth every other day.     Biotin 40347 MCG TABS Take by mouth daily.      brompheniramine-pseudoephedrine-DM 30-2-10 MG/5ML syrup Take 5 mLs by mouth 3 (three) times daily as needed.     buPROPion (WELLBUTRIN SR) 150 MG 12 hr tablet Take 150 mg by mouth 2 (two) times daily.      busPIRone (BUSPAR) 15 MG tablet Take 1 tablet by mouth twice daily 180 tablet 0    cholecalciferol (VITAMIN D) 1000 UNITS tablet Take 5,000 Units by mouth daily.     Cladribine (MAVENCLAD, 9 TABS, PO) Take by mouth.     cyclobenzaprine (FLEXERIL) 10 MG tablet Take 1 tablet (10 mg total) by mouth at bedtime. 90 tablet 3   estradiol (CLIMARA - DOSED IN MG/24 HR) 0.0375 mg/24hr patch estradiol 0.0375 mg/24 hr weekly transdermal patch     ezetimibe (ZETIA) 10 MG tablet Take 10 mg by mouth daily.      gabapentin (NEURONTIN) 300 MG capsule TAKE 1 CAPSULE BY MOUTH THREE TIMES DAILY 270 capsule 1   IRON-FOLIC ACID-VIT B12 PO Take by mouth daily.     Krill Oil 1000 MG CAPS Take 1,000 mg by mouth daily.      Melatonin 10 MG TABS Take 10 mg by mouth daily.     metFORMIN (GLUCOPHAGE-XR) 500 MG 24 hr  tablet Take 2 tablets by mouth 2 (two) times daily with a meal. At supper     methylPREDNISolone (MEDROL) 4 MG tablet Taper from 6 pills po for one day to 1 pill po the last day over 6 days 21 tablet 0   mirabegron ER (MYRBETRIQ) 50 MG TB24 tablet Take 1 tablet by mouth once daily 90 tablet 0   Multiple Vitamins-Minerals (ZINC PO) Take 50 mg by mouth daily.     omeprazole (PRILOSEC) 20 MG capsule Take 20 mg by mouth daily.      phentermine 37.5 MG capsule Take 1 capsule by mouth in the morning 30 capsule 5   selenium 50 MCG TABS tablet Take 50 mcg by mouth daily.     Spacer/Aero-Holding Chambers (EQ SPACE CHAMBER ANTI-STATIC) DEVI USE AS DIRECTED WITH  INHALERS 1 each 0   TURMERIC PO Take 1 capsule by mouth every morning. +Blackpepper 600mg /5mg      varenicline (CHANTIX) 1 MG tablet Take 1 tablet by mouth twice daily 60 tablet 2   venlafaxine XR (EFFEXOR-XR) 150 MG 24 hr capsule Take 2 capsules by mouth daily.     zinc gluconate 50 MG tablet Take 50 mg by mouth daily.     Budeson-Glycopyrrol-Formoterol (BREZTRI AEROSPHERE) 160-9-4.8 MCG/ACT AERO Inhale 2 puffs into the lungs in the morning and at bedtime. 3 each 3   Budeson-Glycopyrrol-Formoterol (BREZTRI AEROSPHERE) 160-9-4.8 MCG/ACT AERO  Inhale 2 puffs into the lungs in the morning and at bedtime. 3 each 0   No facility-administered medications prior to visit.     Review of Systems:   Constitutional: No weight loss or gain, night sweats, fevers, chills, fatigue, or lassitude. HEENT: No headaches, difficulty swallowing, tooth/dental problems, or sore throat. No sneezing, itching, ear ache, nasal congestion, or post nasal drip CV:  No chest pain, orthopnea, PND, swelling in lower extremities, anasarca, dizziness, palpitations, syncope Resp: +shortness of breath with exertion (improved); occasional chest tightness; rare wheeze. No excess mucus or change in color of mucus. No productive or non-productive. No hemoptysis. No chest wall deformity GI:  No heartburn, indigestion MSK:  No joint pain or swelling.   Neuro: No dizziness or lightheadedness.  Psych: No depression or anxiety. Mood stable.     Physical Exam:  BP 134/86   Pulse 88   Ht 5\' 4"  (1.626 m)   Wt 190 lb 3.2 oz (86.3 kg)   SpO2 97%   BMI 32.65 kg/m   GEN: Pleasant, interactive, well-appearing; obese; in no acute distress HEENT:  Normocephalic and atraumatic. PERRLA. Sclera white. Nasal turbinates pink, moist and patent bilaterally. No rhinorrhea present. Oropharynx pink and moist, without exudate or edema. No lesions, ulcerations, or postnasal drip.  NECK:  Supple w/ fair ROM. No JVD present. Normal carotid impulses w/o bruits. Thyroid symmetrical with no goiter or nodules palpated. No lymphadenopathy.   CV: RRR, no m/r/g, no peripheral edema. Pulses intact, +2 bilaterally. No cyanosis, pallor or clubbing. PULMONARY:  Unlabored, regular breathing. Clear bilaterally A&P w/o wheezes/rales/rhonchi. No accessory muscle use.  GI: BS present and normoactive. Soft, non-tender to palpation. No organomegaly or masses detected.  MSK: No erythema, warmth or tenderness. Cap refil <2 sec all extrem. No deformities or joint swelling noted.  Neuro: A/Ox3. No focal  deficits noted.   Skin: Warm, no lesions or rashe Psych: Normal affect and behavior. Judgement and thought content appropriate.     Lab Results:  CBC    Component Value Date/Time   WBC 5.1 10/26/2022 1523  WBC 5.8 09/28/2022 1539   RBC 4.11 10/26/2022 1523   RBC 3.93 09/28/2022 1539   HGB 13.0 10/26/2022 1523   HCT 38.6 10/26/2022 1523   PLT 129 (L) 10/26/2022 1523   MCV 94 10/26/2022 1523   MCH 31.6 10/26/2022 1523   MCH 32.2 08/31/2021 1453   MCHC 33.7 10/26/2022 1523   MCHC 33.8 09/28/2022 1539   RDW 13.6 10/26/2022 1523   LYMPHSABS 0.7 10/26/2022 1523   MONOABS 0.3 09/28/2022 1539   EOSABS 0.1 10/26/2022 1523   BASOSABS 0.0 10/26/2022 1523    BMET    Component Value Date/Time   NA 141 10/26/2022 1523   K 4.1 10/26/2022 1523   CL 102 10/26/2022 1523   CO2 26 10/26/2022 1523   GLUCOSE 98 10/26/2022 1523   GLUCOSE 154 (H) 03/02/2021 1318   BUN 19 10/26/2022 1523   CREATININE 0.92 10/26/2022 1523   CREATININE 1.02 (H) 03/02/2021 1318   CALCIUM 9.3 10/26/2022 1523   GFRNONAA >60 03/02/2021 1318   GFRAA 75 02/27/2020 1316    BNP No results found for: "BNP"   Imaging:  MR LUMBAR SPINE WO CONTRAST  Result Date: 04/02/2023  [facility] [address] [place], [place] [address] [phone] NEUROIMAGING REPORT STUDY DATE: 04/02/2023 PATIENT NAME: Baileyann Dengler DOB: [birth year] MRN: [id number] EXAM: MRI of the lumbar spine without contrast ORDERING CLINICIAN: Richard A. Epimenio Foot, MD. PhD CLINICAL HISTORY: 62 year old woman with multiple sclerosis, gait disturbance and lumbar degenerative changes with back and leg pain COMPARISON FILMS: 03/21/2022 TECHNIQUE: MRI of the lumbar spine was obtained utilizing 4 mm sagittal slices from T11-12 down to the lower sacrum with T1, T2 and inversion recovery views. In addition 4 mm axial slices from L1-2 down to L5-S1 level were included with T1 and T2 weighted views. CONTRAST: None IMAGING SITE: [facility], [address] [place], [place], [place] FINDINGS: On sagittal images, the spine is imaged from T11 to the sacrum.   The conus medullaris and cauda equine appear normal.  The visible spinal cord appears normal.  There is negligible 1 to 2 mm anterolisthesis of L5 upon S1 that appears stable.  Endplate degenerative changes are noted at T10-T11, L1-L2, L3-L4, L4-L5 and L5-S1.  There is a hemangioma within the T11 vertebral body..  The discs and interspaces were further evaluated on axial views from T12 to S1 as follows: T12-L1: This level appears normal. L1-L2: There is mild disc bulging but no foraminal narrowing, spinal stenosis or nerve root compression. L2-L3: There is mild facet hypertrophy with minimal joint effusions and mild ligamenta flava hypertrophy.  The disc appears normal.  There is no spinal stenosis or nerve root compression. L3-L4: Minimal disc bulging, mild facet hypertrophy and ligamenta flava hypertrophy is noted.  There is no foraminal narrowing, lateral recess stenosis, spinal stenosis or nerve root compression. L4-L5: The disc appears normal.  There is moderate facet hypertrophy with a small left joint effusion, and ligamenta flava hypertrophy but no significant foraminal narrowing, lateral recess stenosis, spinal stenosis.  No nerve root compression. L5-S1: There is reduced disc height, trace anterolisthesis,  small disc protrusion, moderately severe facet hypertrophy.  There is minimal left foraminal narrowing but no lateral recess stenosis, spinal stenosis or nerve root compression.  Degenerative changes are stable compared to the MRI from 03/21/2022.   This MRI of the lumbar spine without contrast shows the following: The visible spinal cord appears normal. Multilevel degenerative changes as detailed above that do not lead to  spinal stenosis or nerve root compression.  There is moderate facet hypertrophy at L4-L5 with moderately severe facet hypertrophy at L5-S1. Degenerative changes are  stable compared to the 2023 MRI. INTERPRETING PHYSICIAN: Richard A. Epimenio Foot, MD, PhD, FAAN Certified in  Neuroimaging by [employer]    Administration History     None          Latest Ref Rng & Units 09/28/2022    1:44 PM  PFT Results  FVC-Pre L 3.13   FVC-Predicted Pre % 98   FVC-Post L 3.10   FVC-Predicted Post % 97   Pre FEV1/FVC % % 78   Post FEV1/FCV % % 77   FEV1-Pre L 2.44   FEV1-Predicted Pre % 99   FEV1-Post L 2.37   DLCO uncorrected ml/min/mmHg 21.88   DLCO UNC% % 109   DLCO corrected ml/min/mmHg 21.88   DLCO COR %Predicted % 109   DLVA Predicted % 106   TLC L 5.56   TLC % Predicted % 110   RV % Predicted % 84     No results found for: "NITRICOXIDE"      Assessment & Plan:   Bronchitis, chronic (HCC) Chronic bronchitis with emphysema on imaging. No formal obstruction on imaging. Receives benefit from inhaler therapy and oral steroids. Markus Daft is expensive. Will trial her on dual therapy with Wixela, which appears to be affordable at $24 a month. Advised her to notify if cough worsens with this. Side effect profile reviewed. Continued smoking cessation. Action plan in place.  Patient Instructions  Continue Albuterol inhaler 2 puffs or 3 mL neb every 6 hours as needed for shortness of breath or wheezing. Notify if symptoms persist despite rescue inhaler/neb use. Continue chantix 1 mg Twice daily  Stop Breztri. Start Wixela 1 puff Twice daily. Brush tongue and rinse mouth afterwards   Attend lung cancer screening CT as scheduled    Follow up in 6 months with Dr. Tonia Brooms or Philis Nettle. If symptoms worsen, please contact office for sooner follow up or seek emergency care.    Tobacco abuse Encouraged to remain smoke free. She quit around 4 months ago. Will continue Chantix for another 2 months for a total of 6  months of therapy and reassess. Could consider longer duration to avoid relapse. Advised to monitor symptoms and mood on this.  Continue lung cancer screening program.     I spent 28 minutes of dedicated to the care of this patient on the date of this encounter to include pre-visit review of records, face-to-face time with the patient discussing conditions above, post visit ordering of testing, clinical documentation with the electronic health record, making appropriate referrals as documented, and communicating necessary findings to members of the patients care team.  Noemi Chapel, NP 04/03/2023  Pt aware and understands NP's role.

## 2023-04-07 ENCOUNTER — Ambulatory Visit: Payer: Medicare HMO | Admitting: Nurse Practitioner

## 2023-04-10 DIAGNOSIS — D696 Thrombocytopenia, unspecified: Secondary | ICD-10-CM | POA: Diagnosis not present

## 2023-04-10 DIAGNOSIS — F321 Major depressive disorder, single episode, moderate: Secondary | ICD-10-CM | POA: Diagnosis not present

## 2023-04-10 DIAGNOSIS — E1122 Type 2 diabetes mellitus with diabetic chronic kidney disease: Secondary | ICD-10-CM | POA: Diagnosis not present

## 2023-04-10 DIAGNOSIS — N182 Chronic kidney disease, stage 2 (mild): Secondary | ICD-10-CM | POA: Diagnosis not present

## 2023-04-10 DIAGNOSIS — E038 Other specified hypothyroidism: Secondary | ICD-10-CM | POA: Diagnosis not present

## 2023-04-10 DIAGNOSIS — G729 Myopathy, unspecified: Secondary | ICD-10-CM | POA: Diagnosis not present

## 2023-04-10 DIAGNOSIS — E782 Mixed hyperlipidemia: Secondary | ICD-10-CM | POA: Diagnosis not present

## 2023-04-14 ENCOUNTER — Ambulatory Visit
Admission: RE | Admit: 2023-04-14 | Discharge: 2023-04-14 | Disposition: A | Discharge status: Home / Self Care | Payer: Medicare HMO | Source: Ambulatory Visit | Attending: Obstetrics & Gynecology | Admitting: Obstetrics & Gynecology

## 2023-04-14 DIAGNOSIS — Z0000 Encounter for general adult medical examination without abnormal findings: Secondary | ICD-10-CM

## 2023-04-14 DIAGNOSIS — Z1231 Encounter for screening mammogram for malignant neoplasm of breast: Secondary | ICD-10-CM | POA: Diagnosis not present

## 2023-04-17 DIAGNOSIS — E1122 Type 2 diabetes mellitus with diabetic chronic kidney disease: Secondary | ICD-10-CM | POA: Diagnosis not present

## 2023-04-17 DIAGNOSIS — G35 Multiple sclerosis: Secondary | ICD-10-CM | POA: Diagnosis not present

## 2023-04-17 DIAGNOSIS — Z0000 Encounter for general adult medical examination without abnormal findings: Secondary | ICD-10-CM | POA: Diagnosis not present

## 2023-04-17 DIAGNOSIS — N182 Chronic kidney disease, stage 2 (mild): Secondary | ICD-10-CM | POA: Diagnosis not present

## 2023-04-21 ENCOUNTER — Other Ambulatory Visit: Payer: Self-pay | Admitting: Neurology

## 2023-04-25 ENCOUNTER — Other Ambulatory Visit: Payer: Self-pay

## 2023-04-25 MED ORDER — ALPRAZOLAM 0.5 MG PO TABS: 0.5000 mg | ORAL_TABLET | Freq: Every evening | ORAL | 0 refills | Status: DC | PRN

## 2023-04-25 NOTE — Telephone Encounter (Signed)
Another encounter is pending requesting refill for this Rx.

## 2023-04-25 NOTE — Telephone Encounter (Signed)
Pt last seen 10/26/2022 Upcoming Appointment 05/09/2023  Alprazolam 0.5mg  last filled 08/13/2022 Escript 04/25/2023

## 2023-04-27 DIAGNOSIS — Z01419 Encounter for gynecological examination (general) (routine) without abnormal findings: Secondary | ICD-10-CM | POA: Diagnosis not present

## 2023-04-27 DIAGNOSIS — Z124 Encounter for screening for malignant neoplasm of cervix: Secondary | ICD-10-CM | POA: Diagnosis not present

## 2023-04-27 DIAGNOSIS — Z1331 Encounter for screening for depression: Secondary | ICD-10-CM | POA: Diagnosis not present

## 2023-04-27 DIAGNOSIS — M810 Age-related osteoporosis without current pathological fracture: Secondary | ICD-10-CM | POA: Diagnosis not present

## 2023-05-09 ENCOUNTER — Ambulatory Visit (INDEPENDENT_AMBULATORY_CARE_PROVIDER_SITE_OTHER): Payer: Medicare HMO | Admitting: Neurology

## 2023-05-09 ENCOUNTER — Encounter: Payer: Self-pay | Admitting: Neurology

## 2023-05-09 VITALS — BP 150/86 | HR 103 | Ht 64.0 in | Wt 189.0 lb

## 2023-05-09 DIAGNOSIS — R5383 Other fatigue: Secondary | ICD-10-CM

## 2023-05-09 DIAGNOSIS — D696 Thrombocytopenia, unspecified: Secondary | ICD-10-CM

## 2023-05-09 DIAGNOSIS — M545 Low back pain, unspecified: Secondary | ICD-10-CM | POA: Diagnosis not present

## 2023-05-09 DIAGNOSIS — G4733 Obstructive sleep apnea (adult) (pediatric): Secondary | ICD-10-CM | POA: Diagnosis not present

## 2023-05-09 DIAGNOSIS — G35 Multiple sclerosis: Secondary | ICD-10-CM | POA: Diagnosis not present

## 2023-05-09 DIAGNOSIS — F418 Other specified anxiety disorders: Secondary | ICD-10-CM

## 2023-05-09 DIAGNOSIS — G8929 Other chronic pain: Secondary | ICD-10-CM

## 2023-05-09 DIAGNOSIS — R269 Unspecified abnormalities of gait and mobility: Secondary | ICD-10-CM

## 2023-05-09 MED ORDER — PHENTERMINE HCL 37.5 MG PO CAPS: 37.5000 mg | ORAL_CAPSULE | Freq: Every morning | ORAL | 1 refills | Status: DC

## 2023-05-09 MED ORDER — CLONAZEPAM 1 MG PO TABS: 1.0000 mg | ORAL_TABLET | Freq: Every day | ORAL | 5 refills | Status: DC

## 2023-05-09 NOTE — Progress Notes (Signed)
[facility]  PATIENT: Helen Sandoval DOB: 05-23-1961  REFERRING DOCTOR OR PCP:  Anselm Pancoast. ([facility])   Lynn Ito (new PCP at [facility]) SOURCE: patient and records from [facility]  _________________________________   HISTORICAL  CHIEF COMPLAINT:  Chief Complaint  Patient presents with   Room 11    Pt is here Alone. Pt states that she is having a lot of back pain. Pt states that she has memory fog. Pt states that she did QUIT SMOKING. Pt states that she has been on a diet.      HISTORY OF PRESENT ILLNESS:  Helen Sandoval is a 62 yo woman who was diagnosed with relapsing remitting MS in 2015.     Update 05/09/2023 She completed the second year of Mavenclad in April 2021.   After the first Mavenclad, she did well but lymphocyte count was low at 0.1.  Therefore, she did Valtrex for few months.  They bounced back to 0.7 before the second year of Mavenclad.     Her most recent lymphocyte count from 09/28/2022 was low normal at 0.8.  She also has mildly low platelet count   LFTs were okay with minimally elevated ALT.       She has quit smoking  She trips some but no falls due to left foot drop.Marland Kitchen  Her legs feel a bit weak, similar to last year.  Her legs shake going downstairs due to weakness.  She does better going up.  She notes some dysesthesias helped by gabapentin.  She has left > right leg weakness.   If she squats she has trouble getting up..  When she shows me where the pain is, she points to the region of the trochanteric bursa.  She notes that area is tender if she pushes  She feels her tremors are doing worse.  It is both hands, worse on right and a little worse with intention.     She has wwheezing and has been told she has chronic bronchitis.    She started Chantix for smoking cessation and has been able to quit.  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  Her mother also had tremors.  She feels her memory and verbal processing are  doing worse.   She has more stress with her mother's health (recurrent colon cancer felt to be terminal)  At bedtime she takes melatonin and flexeril 10 mg and xanax.  These used to help her more with sleep.   She has depression and anxiety and is on Effexor, buspirone and wellbutrin.   Her mom died last week.      Incontinence is better with Myrbetriq.    Vision is unchanged.     She is on BiPAP 13/8 (pressures will reduce) and has excellent compliance on the d/l though AHI was good at 2.0 and had 90% compliance.    She has lost weight after being diagnosed  with NIDDM type 2 .  She is now on Mounjoro with metformin and has lost 11 pounds.  Her HgbA1c is better.   She has NAFLD (Fatty liver).    MS History:    In 2008, she had headaches and poor balance and was told the MRI had some white matter foci at that time.    She had spinal stenosis and needed surgery in 2014.  The surgeon felt her gait and other issues were decreased out of proportion to the extent of spine disease and referred her to Neurology.   Besides  gait issues, she also had double vision, blurry vision, cognitive difficulty and incontinence in early 2015. She saw Dr. Vernie Ammons in [place]. An MRI of the brain, CSF and visual evoked potentials were consistent with multiple sclerosis.    Initially, she was placed on Tecfidera. Due to progression with more neurologic symptoms, she was switched to Tysabri.   Her last MRI was done 10/2014 but we do not have the images.    Imaging: MRI brain 05/14/2020 shows T2/FLAIR hyperintense foci in the hemispheres, pons and left basal ganglia.  Most of the foci are nonspecific.  A few are periventricular and radially oriented.  The focus in the left basal ganglia could also represent ischemic sequela.    None of the foci appear to be acute and they do not enhance.    There are no new lesions compared with 05/12/2018 MRI.  MRI of the cervical spine shows 1 or 2 small subtle foci within the spinal cord  adjacent to C4-C5 and C6.  These have been seen on previous MRIs.  Though nonspecific, this is consistent with a chronic demyelinating plaque.  Minimal chronic compressive myelopathic signal cannot be ruled out.  There are no new lesions and no enhancing lesions.   Prior C4-C6 ACDF.  There is no nerve root compression or spinal stenosis at these levels.   Mild spinal stenosis and mild foraminal narrowing at C3-C4 and C6-C7 that does not lead to any nerve root compression.  Degenerative changes are stable compared to the 2020 MRI.  MRI of the brain 05/19/2021 showed no new lesions.  MRI of the cervical spine 05/19/2021 showed no new lesions.  MRI of the brain and cervical spine 11/2022 showed no new MS lesions.  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 There is moderate facet hypertrophy at L4-L5 with moderately severe facet hypertrophy at L5-S1.    REVIEW OF SYSTEMS: Constitutional: No fevers, chills, sweats, or change in appetite.   She notes fatigue Eyes: Reports visual changes and double vision.  No eye pain Ear, nose and throat: No hearing loss, ear pain, nasal congestion, sore throat Cardiovascular: No chest pain, palpitations Respiratory:  No shortness of breath at rest or with exertion.   She has snoring and coughing but no wheezes GastrointestinaI: No nausea, vomiting, abdominal pain.  She notes diarrhea and some fecal incontinence Genitourinary:  No dysuria, urinary retention or frequency. She has had some incontinence Musculoskeletal:  No neck pain, back pain Integumentary: No rash, pruritus, skin lesions Neurological: as above Psychiatric: Some depression at this time.  Some anxiety Endocrine: No palpitations, diaphoresis, change in appetite, change in weigh or increased thirst Hematologic/Lymphatic:  No anemia, purpura,  petechiae. Allergic/Immunologic: No itchy/runny eyes, nasal congestion, recent allergic reactions, rashes  ALLERGIES: Allergies  Allergen Reactions   Clindamycin Anaphylaxis   Indomethacin Hives   Levofloxacin Hives   Penicillins Anaphylaxis   Penicillins Cross Reactors Anaphylaxis   Sulfa Antibiotics Swelling    Blister (steven john syndrome)   Levaquin [Levofloxacin Hemihydrate] Hives   Lipitor  [Atorvastatin Calcium]    Multihance [Gadobenate] Hives    Pt having hives/ rash on neck and chest    Sulfamethoxazole-Trimethoprim    Zocor [Simvastatin] Other (See Comments)    Other reaction(s): MUSCLE PAIN Muscle aches    HOME MEDICATIONS:  Current Outpatient Medications:    albuterol (PROVENTIL) (  2.5 MG/3ML) 0.083% nebulizer solution, Take 3 mLs (2.5 mg total) by nebulization every 6 (six) hours as needed for wheezing or shortness of breath., Disp: 75 mL, Rfl: 12   albuterol (VENTOLIN HFA) 108 (90 Base) MCG/ACT inhaler, Inhale 2 puffs into the lungs every 6 (six) hours as needed for wheezing or shortness of breath., Disp: 8 g, Rfl: 6   aspirin EC 81 MG tablet, Take 81 mg by mouth every other day., Disp: , Rfl:    Biotin 44034 MCG TABS, Take by mouth daily. , Disp: , Rfl:    buPROPion (WELLBUTRIN SR) 150 MG 12 hr tablet, Take 150 mg by mouth 2 (two) times daily. , Disp: , Rfl:    busPIRone (BUSPAR) 15 MG tablet, Take 1 tablet by mouth twice daily, Disp: 180 tablet, Rfl: 0   cholecalciferol (VITAMIN D) 1000 UNITS tablet, Take 5,000 Units by mouth daily., Disp: , Rfl:    Cladribine (MAVENCLAD, 9 TABS, PO), Take by mouth., Disp: , Rfl:    clonazePAM (KLONOPIN) 1 MG tablet, Take 1 tablet (1 mg total) by mouth at bedtime., Disp: 30 tablet, Rfl: 5   cyclobenzaprine (FLEXERIL) 10 MG tablet, Take 1 tablet (10 mg total) by mouth at bedtime. (Patient taking differently: Take 10 mg by mouth 3 (three) times daily as needed.), Disp: 90 tablet, Rfl: 3   ezetimibe (ZETIA) 10 MG tablet, Take 10 mg  by mouth daily. , Disp: , Rfl:    fluticasone-salmeterol (WIXELA INHUB) 100-50 MCG/ACT AEPB, Inhale 1 puff into the lungs 2 (two) times daily., Disp: 60 each, Rfl: 12   gabapentin (NEURONTIN) 300 MG capsule, TAKE 1 CAPSULE BY MOUTH THREE TIMES DAILY, Disp: 270 capsule, Rfl: 1   IRON-FOLIC ACID-VIT B12 PO, Take by mouth daily., Disp: , Rfl:    Krill Oil 1000 MG CAPS, Take 1,000 mg by mouth daily. , Disp: , Rfl:    Melatonin 10 MG TABS, Take 10 mg by mouth daily., Disp: , Rfl:    metFORMIN (GLUCOPHAGE-XR) 500 MG 24 hr tablet, Take 2 tablets by mouth 2 (two) times daily with a meal. At supper, Disp: , Rfl:    methylPREDNISolone (MEDROL) 4 MG tablet, Taper from 6 pills po for one day to 1 pill po the last day over 6 days, Disp: 21 tablet, Rfl: 0   mirabegron ER (MYRBETRIQ) 50 MG TB24 tablet, Take 1 tablet by mouth once daily, Disp: 90 tablet, Rfl: 0   Multiple Vitamins-Minerals (ZINC PO), Take 50 mg by mouth daily., Disp: , Rfl:    omeprazole (PRILOSEC) 20 MG capsule, Take 20 mg by mouth daily. , Disp: , Rfl:    selenium 50 MCG TABS tablet, Take 50 mcg by mouth daily., Disp: , Rfl:    Spacer/Aero-Holding Chambers (EQ SPACE CHAMBER ANTI-STATIC) DEVI, USE AS DIRECTED WITH  INHALERS, Disp: 1 each, Rfl: 0   tirzepatide (MOUNJARO) 7.5 MG/0.5ML Pen, Inject 7.5 mg into the skin once a week., Disp: , Rfl:    TURMERIC PO, Take 1 capsule by mouth every morning. +Blackpepper 600mg /5mg , Disp: , Rfl:    varenicline (CHANTIX) 1 MG tablet, Take 1 tablet by mouth twice daily, Disp: 60 tablet, Rfl: 2   venlafaxine XR (EFFEXOR-XR) 150 MG 24 hr capsule, Take 2 capsules by mouth daily., Disp: , Rfl:    zinc gluconate 50 MG tablet, Take 50 mg by mouth daily., Disp: , Rfl:    estradiol (CLIMARA - DOSED IN MG/24 HR) 0.0375 mg/24hr patch, estradiol 0.0375 mg/24 hr weekly transdermal  patch (Patient not taking: Reported on 05/09/2023), Disp: , Rfl:    phentermine 37.5 MG capsule, Take 1 capsule (37.5 mg total) by mouth every  morning., Disp: 90 capsule, Rfl: 1  PAST MEDICAL HISTORY: Past Medical History:  Diagnosis Date   Ankle fracture    Diabetes (HCC)    Fatty liver    Osteopenia    Osteoporosis    Scarlet fever    Shingles    Sleep apnea    Spina bifida    Splenomegaly    Thrombocytopenia (HCC)    Vitamin D deficiency     PAST SURGICAL HISTORY: Past Surgical History:  Procedure Laterality Date   ABDOMINAL HYSTERECTOMY     fibroids and heavy bleeding   BREAST BIOPSY Right    20+ yrs ago   CERVICAL FUSION      FAMILY HISTORY: Family History  Problem Relation Age of Onset   Bladder Cancer Mother    Colon cancer Mother    Colon cancer Father        diagnosed less than age of 63.    Bladder Cancer Maternal Grandmother    Leukemia Maternal Grandmother     SOCIAL HISTORY:  Social History   Socioeconomic History   Marital status: Divorced    Spouse name: Not on file   Number of children: Not on file   Years of education: Not on file   Highest education level: Not on file  Occupational History   Not on file  Tobacco Use   Smoking status: Former    Current packs/day: 0.00    Types: Cigarettes    Quit date: 11/21/2022    Years since quitting: 0.4   Smokeless tobacco: Never   Tobacco comments:    1 cigarrettes per day. 09/28/2022 [place]  Substance and Sexual Activity   Alcohol use: Yes    Comment: occasional use. a few times a year.    Drug use: No   Sexual activity: Not on file  Other Topics Concern   Not on file  Social History Narrative   Not on file   Social Drivers of Health   Financial Resource Strain: Not on file  Food Insecurity: Not on file  Transportation Needs: Not on file  Physical Activity: Not on file  Stress: Not on file  Social Connections: Not on file  Intimate Partner Violence: Not on file     PHYSICAL EXAM  Vitals:   05/09/23 1453  BP: (!) 150/86  Pulse: (!) 103  Weight: 189 lb (85.7 kg)  Height: 5\' 4"  (1.626 m)    Body mass index is 32.44  kg/m.   General: The patient is well-developed and well-nourished and in no acute distress  Neck: The neck is supple with good ROM and nontender.    Musculoskeletal:  She has tenderness over the trochanteric bursa of the left hip.  No tenderness over SI joint or piriformis muscles  Neurologic Exam  Mental status: The patient is alert and oriented x 3 at the time of the examination.  During the interaction today, she has apparent normal recent and remote memory, with an apparently normal attention span and concentration ability.   Speech is normal.  Cranial nerves: Extraocular movements appear normal.  Facial strength was normal.  Trapezius strength is normal.  No dysarthria is noted.  Hearing appears normal and symmetric.  Motor: Low amplitude fast tremor in the hands, worsened with intention.  Muscle bulk is normal.   Tone is normal. Strength is  5 / 5 in all 4 extremities.   Sensory: She reports slightly reduced sensation to vibration in the right leg    Coordination:  finger-nose-finger is performed well.  Heel-to-shin is performed well.  Gait and station: Station is normal.  She has a mildly reduced stride..  Tandem gait is wide.  Romberg is negative.  Reflexes: Deep tendon reflexes are symmetric and 2+ arms and 3+ knees with spread.   There is no ankle clonus..    ASSESSMENT AND PLAN    1. Multiple sclerosis (HCC)   2. Depression with anxiety   3. Gait abnormality   4. Other fatigue   5. Thrombocytopenia (HCC)   6. OSA treated with BiPAP   7. Chronic midline low back pain without sciatica       1.  She successfully completed her second course of Mavenclad April 2021.    Last MRI in 2022 showed no new lesions.   MS has been stable  2.   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   She is not a beta blocker candidate due to wheezing/IDDM.   If the tremor gets a lot worse we could consider Mysoline or a benzodiazepine 3.   Continue BiPAP at  pressure to 13/8.  She uses it nightly.  Advised to try to lose some weight.   She has had good compliance and efficacy on downloads.      4.   Continue Phentermine 37.5 mg in the morning for sleepiness, weight loss and fatigue.    5.  Change Xanax to clonazepam 1 mg qHS for insomnia.  Her depression and anxiety have worsened this year despite being on Effexor, bupropion and buspirone.  Therefore, I will make a referral to have her see psychiatry. 6.  If LBP with left leg pain worsens, consider left L4L5 ESI 7.   Return in 6 months or sooner if there are new or worsening neurologic symptoms.   40-minute office visit with the majority of the time spent face-to-face for history and physical, discussion/counseling and decision-making.  Additional time with record review and documentation.   This visit is part of a comprehensive longitudinal care medical relationship regarding the patients primary diagnosis of MS and related concerns.   Levita Monical A. Epimenio Foot, MD, PhD 05/09/2023, 7:45 PM Certified in Neurology, Clinical Neurophysiology, Sleep Medicine, Pain Medicine and Neuroimaging . [facility] [address] [place], [place] [address] [phone]

## 2023-05-18 ENCOUNTER — Other Ambulatory Visit: Payer: Self-pay

## 2023-05-18 ENCOUNTER — Other Ambulatory Visit: Payer: Self-pay | Admitting: Neurology

## 2023-05-23 ENCOUNTER — Other Ambulatory Visit: Payer: Self-pay | Admitting: Neurology

## 2023-05-29 ENCOUNTER — Other Ambulatory Visit: Payer: Self-pay | Admitting: Nurse Practitioner

## 2023-06-14 NOTE — Progress Notes (Signed)
Psychiatric Initial Adult Assessment  Patient Identification: Helen Sandoval MRN:  [id number] Date of Evaluation:  06/17/2023 Referral Source: Despina Arias, MD  Assessment:  Helen Sandoval is a 63 y.o. female with a history of MDD, relapsing remitting multiple sclerosis (diagnosed 2015), T2DM, OSA, essential tremor, chronic low back pain, and Vitamin D deficiency who presents to [facility] via video conferencing for initial evaluation of depression and anxiety.  Patient reports she was first diagnosed with depression in setting of MS diagnosis made in 2015.  She reports she has undergone numerous medication trials since that time and has been on below regimen for many years.  She feels symptoms of depression had been somewhat controlled up until passing of her mother in November 2024.  While she endorses symptoms consistent with active grief, she also endorses symptoms felt to extend beyond normal grief process including hopelessness, anhedonia, and passive SI.  She consistently denies active SI and there are no acute safety concerns at this time.  She responded well to brief supportive and cognitive therapy interventions in session today and has never undergone psychotherapy in the past; motivational interviewing used to promote engagement in therapy and she will continue to consider this option.  Given identified benefit from buspirone, will increase as below and change Wellbutrin dosing to minimize impact on sleep.  Cross titration from Effexor to alternative medication could be considered in the future given limited benefit from this medication at maximal dosing.  Extensive time was spent counseling on proper sleep hygiene behaviors and how to gradually shift sleep schedule to desired time.  RTC in 4-6 weeks with next provider (patient will need to be seen at [facility] office due to insurance status and the patient was made aware of this at the beginning of the appointment today).    Plan:  # MDD # Grief # Fatigue related to MS diagnosis Past medication trials: Zoloft (lost effectiveness); Prozac (ineffective); Cymbalta (appears to have been brief trial); Viibryd (couldn't get insurance to cover); Xanax; Adderall (inattention) Status of problem: New problem to this provider; chronic with acute exacerbation Interventions: -- Continue Effexor 300 mg daily  -- Given unclear benefit from this medication at maximal dosing, may consider cross titration to alternative medication in future visits -- INCREASE Buspar to 22.5 mg twice daily (patient prefers to avoid TID dosing if possible) -- CHANGE formulation from Wellbutrin SR 150 mg twice daily to Wellbutrin XL 300 mg daily  -- Patient had previously been taking second dose at nighttime; she was counseled to take Wellbutrin in the morning to avoid insomnia  -- Will monitor blood pressure and heart rate carefully given concurrent use of high-dose Effexor and phentermine -- Patient prescribed gabapentin 300 mg twice daily by neurology -- Patient would likely benefit from prescribing medications in bubble pack due to self-reported mistakes in medication administration; she is currently using a pill box  -- This Clinical research associate will pass this recommendation along to her neurologist and defer use of bubble pack to next provider -- R/o contributing medical conditions: CBC, TSH, CMP grossly wnl 10/26/2022; Vitamin D wnl 08/12/20 - remains on vitamin D supplementation  -- Strongly encouraged psychotherapy in combination with medications for treatment of depression and support surrounding MS diagnosis; patient was more open to this by the end of the visit but would like more time to consider before referral is placed  # Delayed sleep phase  OSA Past medication trials: melatonin, Xanax Status of problem: chronic Interventions: -- Continue melatonin  10 mg nightly for circadian rhythm regulation -- Patient recently started on Klonopin 1 mg  nightly by neurology  -- Would recommend focusing on behavioral strategies for sleep and avoidance of benzodiazepines if possible given history of falls and age -- Continue to promote proper sleep hygiene practices -- Patient endorses adherence with BiPAP   Patient was given contact information for behavioral health clinic and was instructed to call 911 for emergencies.   Subjective:  Chief Complaint:  Chief Complaint  Patient presents with   Medication Management   New Patient (Initial Visit)    History of Present Illness:    Chart review: -- Referred by neurology for depression and anxiety in setting of MS. Mom recently passed in Dec 2024.  -- Current home psychotropics Effexor 300 mg daily Wellbutrin SR 150 mg BID Buspar 15 mg BID Klonopin 1 mg nightly Phentermine 37.5 mg daily Gabapentin 300 mg TID Melatonin 10 mg nightly   Confirmed current medications; patient reports she was first diagnosed with MS in 2015 after she began having headaches and falls. She then saw a psychiatrist for the first time in 2016 due to depressive symptoms in setting of MS diagnosis.  Reports that when depression is at its most severe, she feels low at that time with constant crying, low energy and motivation, hopelessness, anhedonia, passive SI. Denies active SI which she attributes to needing to be there for her sister and mom. She was started on a medication (can't remember what) and it was helpful but then lost effectiveness.  She reports she has since gone through many medication trials although feels that medications tend to work at first but then lose effectiveness.  She reports she has been on Effexor and Wellbutrin for many years  Currently, depression is "not fair" related to mom's passing (98 yo) Saturday after Thanksgiving.  Patient states she moved in with mom in 2016 and up until her mom's passing, had been taking care of her 24/7 due to colon cancer diagnosis. She states they were very  close. Currently, sister and husband and their son live upstairs while she has her own independent living space downstairs. She currently describes feeling "down in the dumps" most days with frequent crying. Reports feeling "stuck in her grief and depression" with low energy/motivation. States fatigue is also chronic issue related to MS however now is experiencing low motivation and anhedonia as well. Used to enjoy going to movies and shopping with sister/mom. Used to go to water aerobics prior to COVID. Occasionally may cook and bake. Endorses intact self-care and still running errands as necessary.  Reports occasional passive SI - not every day.  Denies active SI.  Tries to challenge it but hard to do this. Identifies sister as protective factor; helps to stay busy.  Sleeping "terribly" at night -previously she was staying up until 3-5AM and then waking up at 12/1PM. Denies nighttime awakenings. Was recently started on Klonopin and melatonin by neurologist and this has been helpful. Lately, falling asleep by 2AM. Goal bedtime is 12AM and goal wake up is 8/9AM. She does sleep with TV on. Doesn't usually nap during the day. Takes nighttime meds at 10PM; melatonin about 1 hour before bedtime. Sleep hygiene extensively reviewed.   Appetite is "too good" but has been making progress in losing weight.   Reports experiencing anxiety most days and being more easily upset by stressors; reports being frustrated by MS limitations.   MS symptoms have been progressing - was having a lot of  falls but this has improved lately. Now has poor spatial perception; has some difficulty with word finding and some forgetfulness. Able to bathe, groom, dress, toilet self independently. Not able to drive at night due to memory issues but continues to drive during the day locally. She manages pills once a week and uses a pull box; reports frequently messing medications up. Sister does not assist with medications. She has never used  bubble pack but would be interested in this.  Reports visual illusions (sees shadows in her visual field at night just when driving) but denies overt VH. Has not been tested for cataracts. Denies AH. Denies history of mania/hypomania.   Reviewed current medication regimen.  She is unsure of any ongoing benefit from Effexor and Wellbutrin.  She identifies that buspirone has been very helpful to her.  She is amenable to further titration of buspirone and optimization of Wellbutrin dosing to minimize impact on sleep.  Discussed that titration from Effexor to alternative medication may be considered in future visits.  She has never been in therapy and reports feeling hesitant to engage in therapy due to feeling "shy" - this Clinical research associate provided brief orientation to therapy and identified ways in which she had engaged in brief therapeutic interventions during visit today.  By end of visit, she appeared to be more open to referral to therapy but would like to think about this more.  Has never participated in MS support groups.   All questions/concerns addressed.  PDMP: -- Klonopin 1 mg tablet QTY 30 last filled 06/12/23; started 05/09/23 -- Phentermine 37.5 mg QTY 30 last filled 06/04/23; fills dating back to Jan 2023 -- Xanax 0.5 mg tablet QTY 30 last filled 04/25/23; fills dating back to Oct 2023   Past Psychiatric History:  Diagnoses: MDD Medication trials: Zoloft (lost effectiveness); Prozac (ineffective); Cymbalta (appears to have been brief trial); Viibryd (couldn't get insurance to cover); Xanax; Adderall (inattention) Previous psychiatrist/therapist: denies Hospitalizations: denies Suicide attempts: denies SIB: denies Hx of violence towards others: denies  Previous Psychotropic Medications: Yes   Substance Abuse History in the last 12 months:  No.  -- Etoh: 1 glass wine every few months  -- Tobacco: wuit 11/20/22; on Chantix  -- Cannabis: denies  -- Illicit drugs: denies  -- Caffeine: 1 soda  daily; denies use of coffee, tea, energy drinks  Past Medical History:  Past Medical History:  Diagnosis Date   Ankle fracture    Depression    Diabetes (HCC)    Fatty liver    Osteopenia    Osteoporosis    Scarlet fever    Shingles    Sleep apnea    Spina bifida    Splenomegaly    Thrombocytopenia (HCC)    Vitamin D deficiency     Past Surgical History:  Procedure Laterality Date   ABDOMINAL HYSTERECTOMY     fibroids and heavy bleeding   BREAST BIOPSY Right    20+ yrs ago   CERVICAL FUSION      Family Psychiatric History:  Mom: low mood related to cancer diagnosis (on Celexa which was helpful)  Family History:  Family History  Problem Relation Age of Onset   Bladder Cancer Mother    Colon cancer Mother    Colon cancer Father        diagnosed less than age of 2.    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 09/28/2022 [place]  Substance and Sexual Activity   Alcohol use: Yes    Comment: occasional use. a few times a year.    Drug use: No   Sexual activity: Not on file  Other Topics Concern   Not on file  Social History Narrative   Not on file   Social Drivers of Health   Financial Resource Strain: Not on file  Food Insecurity: Not on file  Transportation Needs: Not on file  Physical Activity: Not  on file  Stress: Not on file  Social Connections: Not on file    Additional Social History: updated  Allergies:   Allergies  Allergen Reactions   Clindamycin Anaphylaxis   Indomethacin Hives   Levofloxacin Hives   Penicillins Anaphylaxis   Penicillins Cross Reactors Anaphylaxis   Sulfa Antibiotics Swelling    Blister (steven john syndrome)   Levaquin [Levofloxacin Hemihydrate] Hives   Lipitor  [Atorvastatin Calcium]    Multihance [Gadobenate] Hives    Pt having hives/ rash on neck and chest    Sulfamethoxazole-Trimethoprim    Zocor [Simvastatin] Other (See Comments)    Other reaction(s): MUSCLE PAIN Muscle aches    Current Medications: Current Outpatient Medications  Medication Sig Dispense Refill   albuterol (PROVENTIL) (2.5 MG/3ML) 0.083% nebulizer solution Take 3 mLs (2.5 mg total) by nebulization every 6 (six) hours as needed for wheezing or shortness of breath. 75 mL 12   albuterol (VENTOLIN HFA) 108 (90 Base) MCG/ACT inhaler Inhale 2 puffs into the lungs every 6 (six) hours as needed for wheezing or shortness of breath. 8 g 6   aspirin EC 81 MG tablet Take 81 mg by mouth every other day.     Biotin 95621 MCG TABS Take by mouth daily.      buPROPion (WELLBUTRIN XL) 300 MG 24 hr tablet Take 1 tablet (300 mg total) by mouth in the morning. 30 tablet 1   cholecalciferol (VITAMIN D) 1000 UNITS tablet Take 5,000 Units by mouth daily.     clonazePAM (KLONOPIN) 1 MG tablet Take 1 tablet (1 mg total) by mouth at bedtime. 30 tablet 5   cyclobenzaprine (FLEXERIL) 10 MG tablet Take 1 tablet (10 mg total) by mouth at bedtime. (Patient taking differently: Take 10 mg by mouth 3 (three) times daily as needed.) 90 tablet 3   ezetimibe (ZETIA) 10 MG tablet Take 10 mg by mouth daily.      gabapentin (NEURONTIN) 300 MG capsule TAKE 1 CAPSULE BY MOUTH THREE TIMES DAILY (Patient taking differently: Take 300 mg by mouth 2 (two) times daily.) 270 capsule 1   IRON-FOLIC ACID-VIT B12 PO Take by  mouth daily.     Krill Oil 1000 MG CAPS Take 1,000 mg by mouth daily.      Melatonin 10 MG TABS Take 10 mg by mouth  daily.     metFORMIN (GLUCOPHAGE-XR) 500 MG 24 hr tablet Take 2 tablets by mouth 2 (two) times daily with a meal. 1 tablet in morning and 2 tablets at night     Multiple Vitamins-Minerals (ZINC PO) Take 50 mg by mouth daily.     MYRBETRIQ 50 MG TB24 tablet Take 1 tablet by mouth once daily 90 tablet 0   omeprazole (PRILOSEC) 20 MG capsule Take 20 mg by mouth daily.      phentermine 37.5 MG capsule Take 1 capsule (37.5 mg total) by mouth every morning. 90 capsule 1   selenium 50 MCG TABS tablet Take 50 mcg by mouth daily.     Spacer/Aero-Holding Chambers (EQ SPACE CHAMBER ANTI-STATIC) DEVI USE AS DIRECTED WITH  INHALERS 1 each 0   tirzepatide (MOUNJARO) 7.5 MG/0.5ML Pen Inject 7.5 mg into the skin once a week.     TURMERIC PO Take 1 capsule by mouth every morning. +Blackpepper 600mg /5mg      varenicline (CHANTIX) 1 MG tablet Take 1 tablet by mouth twice daily 60 tablet 0   zinc gluconate 50 MG tablet Take 50 mg by mouth daily.     busPIRone (BUSPAR) 15 MG tablet Take 1.5 tablets (22.5 mg total) by mouth 2 (two) times daily. 90 tablet 1   Cladribine (MAVENCLAD, 9 TABS, PO) Take by mouth. (Patient not taking: Reported on 06/17/2023)     estradiol (CLIMARA - DOSED IN MG/24 HR) 0.0375 mg/24hr patch estradiol 0.0375 mg/24 hr weekly transdermal patch (Patient not taking: Reported on 06/17/2023)     fluticasone-salmeterol (WIXELA INHUB) 100-50 MCG/ACT AEPB Inhale 1 puff into the lungs 2 (two) times daily. (Patient not taking: Reported on 06/17/2023) 60 each 12   venlafaxine XR (EFFEXOR-XR) 150 MG 24 hr capsule Take 2 capsules (300 mg total) by mouth daily. 60 capsule 1   No current facility-administered medications for this visit.    ROS: Reports chronic forgetfulness and deficits in spatial perception related to MS  Objective:  Psychiatric Specialty Exam: There were no vitals taken  for this visit.There is no height or weight on file to calculate BMI.  General Appearance: Casual and Well Groomed  Eye Contact:  Good  Speech:  Clear and Coherent and Normal Rate  Volume:  Normal  Mood:   "Down in the dumps"  Affect:   Dysthymic however pleasant and social  Thought Content:  Denies AVH; no overt delusional content on interview    Suicidal Thoughts:   Endorses intermittent passive SI; denies active SI  Homicidal Thoughts:  No  Thought Process:  Goal Directed and Linear  Orientation:  Full (Time, Place, and Person)    Memory: Grossly intact although has some difficulty providing dates and details of medical history  Judgment:  Good  Insight:  Good  Concentration:  Concentration: Fair  Recall:  not formally assessed  Fund of Knowledge: Good  Language: Good  Psychomotor Activity:  Normal  Akathisia:  No  AIMS (if indicated): NA  Assets:  Communication Skills Desire for Improvement Financial Resources/Insurance Housing Resilience Social Support Talents/Skills Transportation  ADL's:  Intact  Cognition: WNL  Sleep:   delayed sleep phase   PE: General: sits comfortably in view of camera; no acute distress  Pulm: no increased work of breathing on room air  MSK: all extremity movements appear intact  Neuro: no focal neurological deficits observed  Gait & Station: unable to assess by video    Metabolic Disorder Labs: No results found for: "HGBA1C", "MPG" No  results found for: "PROLACTIN" No results found for: "CHOL", "TRIG", "HDL", "CHOLHDL", "VLDL", "LDLCALC" Lab Results  Component Value Date   TSH 3.080 10/26/2022    Therapeutic Level Labs: No results found for: "LITHIUM" No results found for: "CBMZ" No results found for: "VALPROATE"  Screenings:   Collaboration of Care: Collaboration of Care: Medication Management AEB active medication management and Psychiatrist AEB established with [facility]  Patient/Guardian was advised Release of  Information must be obtained prior to any record release in order to collaborate their care with an outside provider. Patient/Guardian was advised if they have not already done so to contact the registration department to sign all necessary forms in order for [place] to release information regarding their care.   Consent: Patient/Guardian gives verbal consent for treatment and assignment of benefits for services provided during this visit. Patient/Guardian expressed understanding and agreed to proceed.   Televisit via video: I connected with Helen Sandoval on 06/17/23 at  8:00 AM EST by a video enabled telemedicine application and verified that I am speaking with the correct person using two identifiers.  Location: Patient: home address in [place] Provider: remote office in    I discussed the limitations of evaluation and management by telemedicine and the availability of in person appointments. The patient expressed understanding and agreed to proceed.  I discussed the assessment and treatment plan with the patient. The patient was provided an opportunity to ask questions and all were answered. The patient agreed with the plan and demonstrated an understanding of the instructions.   The patient was advised to call back or seek an in-person evaluation if the symptoms worsen or if the condition fails to improve as anticipated.  I provided 90 minutes dedicated to the care of this patient via video on the date of this encounter to include chart review, face-to-face time with the patient, medication management/counseling, brief supportive psychotherapy.  Aadit Hagood A Eliam Snapp 1/25/202512:06 PM

## 2023-06-17 ENCOUNTER — Encounter (HOSPITAL_COMMUNITY): Payer: Self-pay | Admitting: Psychiatry

## 2023-06-17 ENCOUNTER — Ambulatory Visit (HOSPITAL_COMMUNITY): Payer: Medicare HMO | Admitting: Psychiatry

## 2023-06-17 ENCOUNTER — Encounter (HOSPITAL_COMMUNITY): Payer: Self-pay

## 2023-06-17 DIAGNOSIS — G4721 Circadian rhythm sleep disorder, delayed sleep phase type: Secondary | ICD-10-CM

## 2023-06-17 DIAGNOSIS — F332 Major depressive disorder, recurrent severe without psychotic features: Secondary | ICD-10-CM | POA: Diagnosis not present

## 2023-06-17 MED ORDER — VENLAFAXINE HCL ER 150 MG PO CP24: 300.0000 mg | ORAL_CAPSULE | Freq: Every day | ORAL | 1 refills | Status: DC

## 2023-06-17 MED ORDER — BUPROPION HCL ER (XL) 300 MG PO TB24: 300.0000 mg | ORAL_TABLET | Freq: Every morning | ORAL | 1 refills | Status: DC

## 2023-06-17 MED ORDER — BUSPIRONE HCL 15 MG PO TABS: 22.5000 mg | ORAL_TABLET | Freq: Two times a day (BID) | ORAL | 1 refills | Status: DC

## 2023-06-17 NOTE — Patient Instructions (Addendum)
Thank you for attending your appointment today.  -- STOP Wellbutrin SR twice daily and START Wellbutrin XL 300 mg once in the morning -- INCREASE Buspar to 1.5 tablets (22.5 mg) twice daily -- Continue other medications as prescribed. -- Try to gradually shift your sleep schedule earlier by 30 minutes each week until you reach your desired bedtime. Avoid electronics and screens before bed as well as napping during the day.  -- Continue to consider therapy for treatment of depression; you can let the next provider know if you are interested in a referral for psychotherapy.  Please do not make any changes to medications without first discussing with your provider. If you are experiencing a psychiatric emergency, please call 911 or present to your nearest emergency department. Additional crisis, medication management, and therapy resources are included below.  [facility]  [address], [place], [place] [address] [phone] WALK-IN URGENT CARE 24/7 FOR ANYONE [address] [place], [place]  [phone] Fax: [phone] [email or web address] *Interpreters available *Accepts all insurance and uninsured for Urgent Care needs *Accepts Medicaid and uninsured for outpatient treatment (below)      ONLY FOR [facility]  Below:    Outpatient New Patient Assessment/Therapy Walk-ins:        Monday, Wednesday, and Thursday 8am until slots are full (first come, first served)                   New Patient Psychiatry/Medication Management        Monday-Friday 8am-11am (first come, first served)               For all walk-ins we ask that you arrive by 7:15am, because patients will be seen in the order of arrival.

## 2023-06-27 ENCOUNTER — Telehealth: Payer: Self-pay | Admitting: Neurology

## 2023-06-27 DIAGNOSIS — G4733 Obstructive sleep apnea (adult) (pediatric): Secondary | ICD-10-CM

## 2023-06-27 NOTE — Telephone Encounter (Signed)
Pt states since she last saw Dr Epimenio Foot she is shaking more, having difficulty walking, her cognitive state is declining rapidly, she'd like a call from RN to discuss.

## 2023-06-27 NOTE — Telephone Encounter (Addendum)
 Pt last saw Dr. Vear 05/09/23 and next f/u 11/28/23. I called pt. This past Saturday, she could not walk due to legs shaking severely. This is now in her arms/head. No recent illness/infection. Saw psychiatrist today who changed meds: Buspar  po TID instead of BID. Effexor  300mg  po every day. Wellbutrin  SR po BID now. I updated med list. Wondering what Dr. Vear would recommend. Aware he mentioned Mysoline or a benzodiazepine. Will send to him to review and make specific recommendation. Aware I will call back latest tomorrow after I hear from him.  Confirmed she is using BIPAP machine nightly. Feels mask does not fit well. Needs new supplies. I placed order and sent community message to [employer] that order placed.  Received this message back 06/28/23 below. I called pt today. Updated her. Advised for her to be on look out for call from [employer]. We will transfer care to them. She verbalized understanding and appreciation.   Notes from last visit:

## 2023-06-28 ENCOUNTER — Encounter: Payer: Self-pay | Admitting: *Deleted

## 2023-06-28 ENCOUNTER — Other Ambulatory Visit: Payer: Self-pay | Admitting: Neurology

## 2023-06-28 DIAGNOSIS — G4733 Obstructive sleep apnea (adult) (pediatric): Secondary | ICD-10-CM

## 2023-06-28 MED ORDER — CLONAZEPAM 1 MG PO TABS: 1.0000 mg | ORAL_TABLET | Freq: Two times a day (BID) | ORAL | 5 refills | Status: DC

## 2023-06-28 NOTE — Telephone Encounter (Signed)
 Sent pt mychart message

## 2023-06-28 NOTE — Addendum Note (Signed)
 Addended by: Oneita Bihari on: 06/28/2023 09:26 AM   Modules accepted: Orders

## 2023-06-30 ENCOUNTER — Telehealth: Payer: Self-pay | Admitting: Neurology

## 2023-06-30 NOTE — Telephone Encounter (Signed)
 Split [employer] pending uploaded notes. Tracking number [id number]

## 2023-07-08 ENCOUNTER — Other Ambulatory Visit: Payer: Self-pay | Admitting: Nurse Practitioner

## 2023-07-10 NOTE — Telephone Encounter (Signed)
 I checked status on the [employer] portal it is still pending.

## 2023-07-13 DIAGNOSIS — R413 Other amnesia: Secondary | ICD-10-CM | POA: Diagnosis not present

## 2023-07-13 DIAGNOSIS — E1122 Type 2 diabetes mellitus with diabetic chronic kidney disease: Secondary | ICD-10-CM | POA: Diagnosis not present

## 2023-07-13 DIAGNOSIS — G720 Drug-induced myopathy: Secondary | ICD-10-CM | POA: Diagnosis not present

## 2023-07-13 DIAGNOSIS — R296 Repeated falls: Secondary | ICD-10-CM | POA: Diagnosis not present

## 2023-07-13 DIAGNOSIS — R443 Hallucinations, unspecified: Secondary | ICD-10-CM | POA: Diagnosis not present

## 2023-07-13 DIAGNOSIS — E038 Other specified hypothyroidism: Secondary | ICD-10-CM | POA: Diagnosis not present

## 2023-07-17 DIAGNOSIS — R443 Hallucinations, unspecified: Secondary | ICD-10-CM | POA: Diagnosis not present

## 2023-07-17 NOTE — Telephone Encounter (Signed)
 Split [place] [facility]kley Harvey: [id number] (exp. 06/30/23 to 11/15/23)

## 2023-07-17 NOTE — Telephone Encounter (Signed)
 Split [place] [facility] Harvey: [id number] (exp. 06/30/23 to 11/15/23)    Spoke with the patient. She is scheduled at [facility] for 07/24/23 at 8 pm.  Raye Sorrow message with appt info.

## 2023-07-24 ENCOUNTER — Ambulatory Visit: Payer: Medicare HMO | Admitting: Neurology

## 2023-07-24 DIAGNOSIS — G4733 Obstructive sleep apnea (adult) (pediatric): Secondary | ICD-10-CM

## 2023-07-24 NOTE — Progress Notes (Unsigned)
**Note De-Identified Helen Sandoval**  Psychiatric Initial Adult Assessment   Patient Identification: Helen Sandoval MRN:  [id number] Date of Evaluation:  07/25/2023 Referral Source: Dr. Josephina Shih Chief Complaint:   Chief Complaint  Patient presents with   Establish Care   Visit Diagnosis:    ICD-10-CM   1. Severe episode of recurrent major depressive disorder, without psychotic features (HCC)  F33.2 busPIRone (BUSPAR) 30 MG tablet    buPROPion (WELLBUTRIN XL) 300 MG 24 hr tablet    venlafaxine XR (EFFEXOR XR) 75 MG 24 hr capsule    2. 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 female with a history of MDD, relapsing remitting multiple sclerosis (diagnosed 2015), T2DM, OSA, essential tremor, chronic low back pain, and Vitamin D deficiency who presents virtually to [facility] for initial evaluation on 07/25/2023.    At initial evaluation patient reports ***   Patient reports she was first diagnosed with depression in setting of MS diagnosis made in 2015.  She reports she has undergone numerous medication trials since that time and has been on below regimen for many years.  She feels symptoms of depression had been somewhat controlled up until passing of her mother in November 2024.  While she endorses symptoms consistent with active grief, she also endorses symptoms felt to extend beyond normal grief process including hopelessness, anhedonia, and passive SI.  She consistently denies active SI and there are no acute safety concerns at this time.  She responded well to brief supportive and cognitive therapy interventions in session today and has never undergone psychotherapy in the past; motivational interviewing used to promote engagement in therapy and she will continue to consider this option.  Given identified benefit from buspirone, will increase as below and change Wellbutrin dosing to minimize impact on sleep.  Cross titration from Effexor to  alternative medication could be considered in the future given limited benefit from this medication at maximal dosing.  Extensive time was spent counseling on proper sleep hygiene behaviors and how to gradually shift sleep schedule to desired time.  Plan: Bubble Pack all medications - Decrease Effexor to 225 mg daily, plan to cross taper to another medication at future appointments - Increase Buspar to 30 mg BID - Continue Wellbutrin XL 300 mg daily - Continue Gabapentin 300 mg BID prescribed by neuro - Recommend decreasing Klonopin to 1 mg QHS prescribed by Neuro - Phentermine 37.5 mg daily managed by neurology - R/o contributing medical conditions: CBC, TSH, CMP grossly wnl 10/26/2022; Vitamin D wnl 08/12/20 - remains on vitamin D supplementation  - Therapy referral  - Sleep study completed today, already on BIPAP - Crisis resources reviewed - Follow up ina month  History of Present Illness:  Patient had her sister on the other line as she has been assisting Helen Sandoval in her care.  While provider could not hear patient's sister the patient would occasionally ask her questions or clarify things with her during the assessment.  Helen Sandoval reports she was first diagnosed with MS in 2015 after she began having headaches and falls. She then saw a psychiatrist for the first time in 2016 due to depressive symptoms in setting of MS diagnosis. Helen Sandoval reports that she has had really bad depression ever since she got MS in 2015.   She describes the depression as severe endorsing symptoms of low mood, excessive tearfulness, fatigue, amotivation, hopelessness, anhedonia, or concentration/memory, and passive SI.  She denies any active SI or intent or plan.  Patient denies any prior suicide attempts or self harm.  Patient lists her sister as protective factor and notes that she has been a great support for her during this time.  We did review crisis resources today.  Patient notes that in the past her depression  had been better controlled with medication.  She had experienced improvements for extended periods before the medication seemed to wear off.  This is similarly case with the current regimen.  It had been effective in the past before gradually wearing off.    Patient reports sleeping "terribly" at night -previously she was staying up until 3-5AM and then waking up at 12/1PM. Denies nighttime awakenings. Was started on Klonopin and melatonin by neurologist and this has been helpful. Lately, falling asleep by 2AM and wakes up around 11-12. Goal bedtime is 12AM and goal wake up is 8/9AM. She does sleep with TV on. Doesn't usually nap during the day. Takes nighttime meds at 10PM; melatonin about 1 hour before bedtime. Sleep hygiene extensively reviewed.  Notably neurologist had increased Klonopin to 1 mg twice daily to help manage tremor symptoms.  Patient reports not having started the increased dose.  MS symptoms have been progressing - has been having a lot of falls  and tremors which have been increasing. Now has poor spatial perception; has some difficulty with word finding and some forgetfulness. Able to bathe, groom, dress, toilet self independently. Not able to drive at night due to memory issues but continues to drive during the day locally. She manages pills once a week and uses a pill box; reports frequently missing medications up. Sister does not assist with medications. She has never used bubble pack but would be interested in this. Furthermore there have been some adverse side effects from her medications to manage the MS which have made things more difficult  [name] had listed a stressor of her mother passing after Thanksgiving which is contributing to her depression.  However she does not feel like the symptoms began to get worse around that time.  Notably when we were discussing this topic patient became increasingly tearful and had multiple pauses in her speech.  Patient moved in with mom in 2016  and lived with her up until her passing.  Height had been acting as her primary caregiver due to her mother's colon cancer diagnoses.  The 2 were very close and her mother's passing was particularly hard for Helen Sandoval. Currently, sister and husband and their son live upstairs while she has her own independent living space downstairs.    Reports visual illusions (sees shadows in her visual field at night just when driving) but denies overt VH. Has not been tested for cataracts. Denies AH. Denies history of mania/hypomania.   Reviewed current medication regimen.  In the past her medication were very beneficial to her but over time the effect has worn off. The BuSpar had been helpful she did endorse some improvement with the recent titration.  As for the Effexor and Wellbutrin she is unsure if there is any continued benefit.  We discussed titrating BuSpar further and starting to taper off Effexor.  Patient was in agreement with this and risk and benefits were reviewed.  We also explained that we would plan to cross titrate Effexor with another medication however would want to taper Effexor a bit before starting this.  Furthermore we rediscussed the therapy and poor group options.  As patient is notably still  struggling with the grief following her mother's passing she would benefit from therapy.  She still expressed hesitancy but agreed to being referred today.  As for the MS support group she expressed more interest in that and we provided her with contact information to get involved with a more local group  Associated Signs/Symptoms: Depression Symptoms:  {DEPRESSION SYMPTOMS:20000} (Hypo) Manic Symptoms:  {BHH MANIC SYMPTOMS:22872} Anxiety Symptoms:  {BHH ANXIETY SYMPTOMS:22873} Psychotic Symptoms:  {BHH PSYCHOTIC SYMPTOMS:22874} PTSD Symptoms: {BHH PTSD SYMPTOMS:22875}  Past Psychiatric History:  Diagnoses: MDD Medication trials: Zoloft (lost effectiveness); Prozac (ineffective); Cymbalta (appears  to have been brief trial); Viibryd (couldn't get insurance to cover); Xanax; Adderall (inattention, lost effect over time) Previous psychiatrist/therapist: Dr. Josephina Shih for an initial eval in January of 2025 Hospitalizations: denies Suicide attempts: denies SIB: denies Hx of violence towards others: denies   Substance use:  -- Etoh: 1 glass wine every few months  -- Tobacco: quit 11/20/22; on Chantix  -- Cannabis: denies  -- Illicit drugs: denies  -- Caffeine: 1 soda daily; denies use of coffee, tea, energy drinks   Previous Psychotropic Medications: Yes   Substance Abuse History in the last 12 months:  No.  Consequences of Substance Abuse: NA  Past Medical History:  Past Medical History:  Diagnosis Date   Ankle fracture    Depression    Diabetes (HCC)    Fatty liver    Osteopenia    Osteoporosis    Scarlet fever    Shingles    Sleep apnea    Spina bifida    Splenomegaly    Thrombocytopenia (HCC)    Vitamin D deficiency     Past Surgical History:  Procedure Laterality Date   ABDOMINAL HYSTERECTOMY     fibroids and heavy bleeding   BREAST BIOPSY Right    20+ yrs ago   CERVICAL FUSION     Family Psychiatric History: Denies  Family History:  Family History  Problem Relation Age of Onset   Bladder Cancer Mother    Colon cancer Mother    Colon cancer Father        diagnosed less than age of 19.    Bladder Cancer Maternal Grandmother    Leukemia Maternal Grandmother     Social History:   Social History   Socioeconomic History   Marital status: Divorced    Spouse name: Not on file   Number of children: Not on file   Years of education: Not on file   Highest education level: Not on file  Occupational History   Not on file  Tobacco Use   Smoking status: Former    Current packs/day: 0.00    Types: Cigarettes    Quit date: 11/21/2022    Years since quitting: 0.6   Smokeless tobacco: Never   Tobacco comments:    1 cigarrettes per day. 09/28/2022 [place]   Substance and Sexual Activity   Alcohol use: Yes    Comment: occasional use. a few times a year.    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 Occasionally may cook and bake. Endorses intact self-care and still running errands as necessary.  Allergies:   Allergies  Allergen Reactions   Clindamycin Anaphylaxis   Indomethacin Hives   Levofloxacin Hives   Penicillins Anaphylaxis   Penicillins Cross Reactors Anaphylaxis   Sulfa Antibiotics Swelling    Blister (steven john syndrome)   Levaquin [Levofloxacin Hemihydrate] Hives   Lipitor  [Atorvastatin Calcium]    Multihance [Gadobenate] Hives    Pt having hives/ rash on neck and chest    Sulfamethoxazole-Trimethoprim    Zocor [Simvastatin] Other (See Comments)    Other reaction(s): MUSCLE PAIN Muscle aches    Metabolic Disorder Labs: No results found for: "HGBA1C", "MPG" No results found for: "PROLACTIN" No results found for: "CHOL", "TRIG", "HDL", "CHOLHDL", "VLDL", "LDLCALC" Lab Results  Component Value Date   TSH 3.080 10/26/2022    Therapeutic Level Labs: No results found for: "LITHIUM" No results found for: "CBMZ" No results found for: "VALPROATE"  Current Medications: Current Outpatient Medications  Medication Sig Dispense Refill   venlafaxine XR (EFFEXOR XR) 75 MG 24 hr capsule Take 3 capsules (225 mg total) by mouth daily. 30 capsule 2   albuterol (PROVENTIL) (2.5 MG/3ML)  0.083% nebulizer solution Take 3 mLs (2.5 mg total) by nebulization every 6 (six) hours as needed for wheezing or shortness of breath. 75 mL 12   albuterol (VENTOLIN HFA) 108 (90 Base) MCG/ACT inhaler Inhale 2 puffs into the lungs every 6 (six) hours as needed for wheezing or shortness of breath. 8 g 6   aspirin EC 81 MG tablet Take 81 mg by mouth every other day.     buPROPion (WELLBUTRIN XL) 300 MG 24 hr tablet Take 1 tablet (300 mg total) by mouth in the morning. 30 tablet 1   busPIRone (BUSPAR) 30 MG tablet Take 1 tablet (30 mg total) by mouth 2 (two) times daily. 60 tablet 2   cholecalciferol (VITAMIN D) 1000 UNITS tablet Take 5,000 Units by mouth daily.     Cladribine (MAVENCLAD, 9 TABS, PO) Take by mouth. (Patient not taking: Reported on 06/17/2023)     clonazePAM (KLONOPIN) 1 MG tablet Take 1 tablet (1 mg total) by mouth 2 (two) times daily. 60 tablet 5   cyclobenzaprine (FLEXERIL) 10 MG tablet Take 1 tablet (10 mg total) by mouth at bedtime. (Patient taking differently: Take 10 mg by mouth 3 (three) times daily as needed.) 90 tablet 3   estradiol (CLIMARA - DOSED IN MG/24 HR) 0.0375 mg/24hr patch estradiol 0.0375 mg/24 hr weekly transdermal patch (Patient not taking: Reported on 06/17/2023)     ezetimibe (ZETIA) 10 MG tablet Take 10 mg by mouth daily.      fluticasone-salmeterol (WIXELA INHUB) 100-50 MCG/ACT AEPB Inhale 1 puff into the lungs 2 (two) times daily. (Patient not taking: Reported on 06/17/2023) 60 each 12   gabapentin (NEURONTIN) 300 MG capsule TAKE 1 CAPSULE BY MOUTH THREE TIMES DAILY 270 capsule 1   Melatonin 10 MG TABS Take 10 mg by mouth daily.     metFORMIN (GLUCOPHAGE-XR) 500 MG 24 hr tablet Take 2 tablets by mouth 2 (two) times daily with a meal. 1 tablet in morning and 2 tablets at night     MYRBETRIQ 50 MG TB24 tablet Take 1 tablet by mouth once daily 90 tablet 0   omeprazole (PRILOSEC) 20 MG  capsule Take 20 mg by mouth daily.      phentermine 37.5 MG capsule Take 1  capsule (37.5 mg total) by mouth every morning. 90 capsule 1   Spacer/Aero-Holding Chambers (EQ SPACE CHAMBER ANTI-STATIC) DEVI USE AS DIRECTED WITH  INHALERS 1 each 0   tirzepatide (MOUNJARO) 7.5 MG/0.5ML Pen Inject 7.5 mg into the skin once a week.     varenicline (CHANTIX) 1 MG tablet Take 1 tablet by mouth twice daily 60 tablet 0   zinc gluconate 50 MG tablet Take 50 mg by mouth daily.     No current facility-administered medications for this visit.    Psychiatric Specialty Exam: Review of Systems  There were no vitals taken for this visit.There is no height or weight on file to calculate BMI.  General Appearance: Fairly Groomed  Eye Contact:  Good  Speech:  Clear and Coherent  Volume:  Normal  Mood:  Depressed  Affect:  Congruent  Thought Process:  Coherent  Orientation:  Full (Time, Place, and Person)  Thought Content:  Logical  Suicidal Thoughts:  Yes.  without intent/plan  Homicidal Thoughts:  No  Memory:  Immediate;   Fair  Judgement:  Fair  Insight:  Lacking  Psychomotor Activity:  Decreased  Concentration:  Concentration: Poor  Recall:  [employer] of Knowledge:Fair  Language: Fair  Akathisia:  NA    AIMS (if indicated):  not done  Assets:  Manufacturing systems engineer Desire for Improvement Financial Resources/Insurance Housing Transportation  ADL's:  Intact  Cognition: WNL  Sleep:  Fair   Screenings:    Collaboration of Care: Medication Management AEB medication prescription, Psychiatrist AEB chart review, and Other provider involved in patient's care AEB neurology chart review  Patient/Guardian was advised Release of Information must be obtained prior to any record release in order to collaborate their care with an outside provider. Patient/Guardian was advised if they have not already done so to contact the registration department to sign all necessary forms in order for [place] to release information regarding their care.   Consent: Patient/Guardian gives verbal  consent for treatment and assignment of benefits for services provided during this visit. Patient/Guardian expressed understanding and agreed to proceed.     Virtual Visit Helen Video Note  I connected with Helen Sandoval on 07/25/23 at  1:00 PM EST by a video enabled telemedicine application and verified that I am speaking with the correct person using two identifiers.  Location: Patient: Home Provider: Home office   I discussed the limitations of evaluation and management by telemedicine and the availability of in person appointments. The patient expressed understanding and agreed to proceed.   I discussed the assessment and treatment plan with the patient. The patient was provided an opportunity to ask questions and all were answered. The patient agreed with the plan and demonstrated an understanding of the instructions.   The patient was advised to call back or seek an in-person evaluation if the symptoms worsen or if the condition fails to improve as anticipated.  I provided 45 minutes of non-face-to-face time during this encounter.  Stasia Cavalier, MD 3/4/20253:19 PM

## 2023-07-25 ENCOUNTER — Encounter (HOSPITAL_COMMUNITY): Payer: Self-pay

## 2023-07-25 ENCOUNTER — Ambulatory Visit (HOSPITAL_COMMUNITY): Payer: Medicare HMO | Admitting: Psychiatry

## 2023-07-25 DIAGNOSIS — F332 Major depressive disorder, recurrent severe without psychotic features: Secondary | ICD-10-CM

## 2023-07-25 DIAGNOSIS — G4721 Circadian rhythm sleep disorder, delayed sleep phase type: Secondary | ICD-10-CM

## 2023-07-25 MED ORDER — VENLAFAXINE HCL ER 75 MG PO CP24
225.0000 mg | ORAL_CAPSULE | Freq: Every day | ORAL | 2 refills | Status: DC
Start: 2023-07-25 — End: 2023-08-22

## 2023-07-25 MED ORDER — BUSPIRONE HCL 30 MG PO TABS
30.0000 mg | ORAL_TABLET | Freq: Two times a day (BID) | ORAL | 2 refills | Status: AC
Start: 2023-07-25 — End: 2023-09-23

## 2023-07-25 MED ORDER — BUPROPION HCL ER (XL) 300 MG PO TB24
300.0000 mg | ORAL_TABLET | Freq: Every morning | ORAL | 1 refills | Status: DC
Start: 2023-07-25 — End: 2023-08-22

## 2023-07-26 ENCOUNTER — Encounter (HOSPITAL_COMMUNITY): Payer: Self-pay | Admitting: Psychiatry

## 2023-07-26 DIAGNOSIS — M6281 Muscle weakness (generalized): Secondary | ICD-10-CM | POA: Diagnosis not present

## 2023-07-26 DIAGNOSIS — R296 Repeated falls: Secondary | ICD-10-CM | POA: Diagnosis not present

## 2023-07-26 DIAGNOSIS — R2689 Other abnormalities of gait and mobility: Secondary | ICD-10-CM | POA: Diagnosis not present

## 2023-07-26 DIAGNOSIS — M5416 Radiculopathy, lumbar region: Secondary | ICD-10-CM | POA: Diagnosis not present

## 2023-07-27 DIAGNOSIS — R296 Repeated falls: Secondary | ICD-10-CM | POA: Diagnosis not present

## 2023-07-27 DIAGNOSIS — R413 Other amnesia: Secondary | ICD-10-CM | POA: Diagnosis not present

## 2023-07-27 DIAGNOSIS — Z79899 Other long term (current) drug therapy: Secondary | ICD-10-CM | POA: Diagnosis not present

## 2023-07-27 DIAGNOSIS — R2689 Other abnormalities of gait and mobility: Secondary | ICD-10-CM | POA: Diagnosis not present

## 2023-07-27 NOTE — Progress Notes (Signed)
 General Information  Name: Helen Sandoval, Helen Sandoval BMI: 32.44 Physician: ,   ID: [id number] Height: 64.0 in Technician: Domingo Cocking  Sex: Female Weight: 189.0 lb Record: [id number]  Age: 63 [11-26-1960] Date: 07/24/2023 Scorer: Domingo Cocking  Medical & Medication History    She has a long history of OSA and is on BiPAP 13/8 and has excellent compliance and efficacy .  The d/l showed  AHI = 2.0/h and had 90% compliance.   Her current BiPAP is old.   She is now on Mounjoro with metformin and has lost 11 pounds. Her HgbA1c is better. She has NAFLD (Fatty liver).  Proventil, Ventolin HFA, Aspirin, Biotin, Wellbutrin, Buspar, Vitamin D, Mavenclad, Klonopin, Flexeril, Zetia, Neurontin, Wixela inhub, Iron-Folic acid-Vitamin B12, Krill oil, Melatonin, Glucophage, Medrol, Myrbetriq, Zinc, Prilosec, Selenium, Mounjuro, Turmeric, Chantix, Effexor-XR, Zinc gluconate, Estradiol, Phentermine   Sleep Disorder Severe OSA with an AHI=48/h.   OSA was more severe supine (supine OSA AHI = 79.6/h. No stage N3 sleep was recorded.  Only 4.5 minutes REM sleep was recorded with a REM latency of 3 hr 18 minutes.  Sleep efficiency was 74%. No significant PLMS was recorded Mild nocturnal hypoxemia with 7% of the night below SaO2% of 90% Recommendation: VPAP with  EPAP range 8-18 and PS 5 with heated humidifier.  Download in 30 days F/u with Dr. Despina Arias A. Epimenio Foot, MD, PhD, FAAN Certified in Neurology, Clinical Neurophysiology, Sleep Medicine Director, [facility] at [facility]         Comments   Patient arrived for a SPLIT polysomnogram. Patient is a compliant BiPAP user at 13/8cm. Patient does not appear to have a recent polysomnogram and is needing new CPAP supplies/equipment. Procedure explained and all questions answered. Standard paste setup without complications. Patient slept left and supine. There were insufficient events the first half of the night to do the split  protocol but she had more OSA the second half.   Occasional mild to moderate snoring was heard.   No cardiac arrhythmias observed. No significant PLMS observed. No restroom visit.    Lights out: 10:00:14 PM Lights on: 04:56:37 AM   Time Total Supine Side Prone Upright  Recording (TRT) 6h 56.81m 3h 56.40m 3h 0.56m 0h 0.14m 0h 0.71m  Sleep (TST) 5h 6.31m 2h 40.27m 2h 26.57m 0h 0.50m 0h 0.73m   Latency N1 N2 N3 REM Onset Per. Slp. Eff.  Actual 0h 0.27m 0h 1.66m 0h 0.68m 3h 18.71m 1h 30.55m 1h 30.63m 73.59%   Stg Dur Wake N1 N2 N3 REM  Total 110.0 9.0 293.0 0.0 4.5  Supine 75.5 1.5 154.5 0.0 4.5  Side 34.5 7.5 138.5 0.0 0.0  Prone 0.0 0.0 0.0 0.0 0.0  Upright 0.0 0.0 0.0 0.0 0.0   Stg % Wake N1 N2 N3 REM  Total 26.4 2.9 95.6 0.0 1.5  Supine 18.1 0.5 50.4 0.0 1.5  Side 8.3 2.4 45.2 0.0 0.0  Prone 0.0 0.0 0.0 0.0 0.0  Upright 0.0 0.0 0.0 0.0 0.0     Apnea Summary Sub Supine Side Prone Upright  Total 152 Total 152 151 1 0 0    REM 3 3 0 0 0    NREM 149 148 1 0 0  Obs 150 REM 2 2 0 0 0    NREM 148 148 0 0 0  Mix 2 REM 1 1 0 0 0    NREM 1 0 1 0 0  Cen 0 REM 0 0 0 0 0  NREM 0 0 0 0 0   Rera Summary Sub Supine Side Prone Upright  Total 0 Total 0 0 0 0 0    REM 0 0 0 0 0    NREM 0 0 0 0 0   Hypopnea Summary Sub Supine Side Prone Upright  Total 94 Total 94 62 32 0 0    REM 2 2 0 0 0    NREM 92 60 32 0 0   4% Hypopnea Summary Sub Supine Side Prone Upright  Total (4%) 67 Total 67 47 20 0 0    REM 1 1 0 0 0    NREM 66 46 20 0 0     AHI Total Obs Mix Cen  48.16 Apnea 29.76 29.36 0.39 0.00   Hypopnea 18.40 -- -- --  42.87 Hypopnea (4%) 13.12 -- -- --    Total Supine Side Prone Upright  Position AHI 48.16 79.63 13.56 0.00 0.00  REM AHI 66.67   NREM AHI 47.88   Position RDI 48.16 79.63 13.56 0.00 0.00  REM RDI 66.67   NREM RDI 47.88    4% Hypopnea Total Supine Side Prone Upright  Position AHI (4%) 42.87 74.02 8.63 0.00 0.00  REM AHI (4%) 53.33   NREM AHI (4%) 42.72   Position RDI  (4%) 42.87 74.02 8.63 0.00 0.00  REM RDI (4%) 53.33   NREM RDI (4%) 42.72    Desaturation Information Threshold: 2% <100% <90% <80% <70% <60% <50% <40%  Supine 240.0 93.0 0.0 0.0 0.0 0.0 0.0  Side 108.0 14.0 0.0 0.0 0.0 0.0 0.0  Prone 0.0 0.0 0.0 0.0 0.0 0.0 0.0  Upright 0.0 0.0 0.0 0.0 0.0 0.0 0.0  Total 348.0 107.0 0.0 0.0 0.0 0.0 0.0  Index 64.2 19.8 0.0 0.0 0.0 0.0 0.0   Threshold: 3% <100% <90% <80% <70% <60% <50% <40%  Supine 195.0 93.0 0.0 0.0 0.0 0.0 0.0  Side 52.0 14.0 0.0 0.0 0.0 0.0 0.0  Prone 0.0 0.0 0.0 0.0 0.0 0.0 0.0  Upright 0.0 0.0 0.0 0.0 0.0 0.0 0.0  Total 247.0 107.0 0.0 0.0 0.0 0.0 0.0  Index 45.6 19.8 0.0 0.0 0.0 0.0 0.0   Threshold: 4% <100% <90% <80% <70% <60% <50% <40%  Supine 150.0 90.0 0.0 0.0 0.0 0.0 0.0  Side 31.0 14.0 0.0 0.0 0.0 0.0 0.0  Prone 0.0 0.0 0.0 0.0 0.0 0.0 0.0  Upright 0.0 0.0 0.0 0.0 0.0 0.0 0.0  Total 181.0 104.0 0.0 0.0 0.0 0.0 0.0  Index 33.4 19.2 0.0 0.0 0.0 0.0 0.0   Threshold: 4% <100% <90% <80% <70% <60% <50% <40%  Supine 150 90 0 0 0 0 0  Side 31 14 0 0 0 0 0  Prone 0 0 0 0 0 0 0  Upright 0 0 0 0 0 0 0  Total 181 104 0 0 0 0 0   Awakening/Arousal Information # of Awakenings 9  Wake after sleep onset 20.37m  Wake after persistent sleep 20.46m   Arousal Assoc. Arousals Index  Apneas 19 3.7  Hypopneas 8 1.6  Leg Movements 2 0.4  Snore 0 0.0  PTT Arousals 0 0.0  Spontaneous 36 7.0  Total 63 12.3  Leg Movement Information PLMS LMs Index  Total LMs during PLMS 0 0.0  LMs w/ Microarousals 0 0.0   LM LMs Index  w/ Microarousal 2 0.4  w/ Awakening 2 0.4  w/ Resp Event 0 0.0  Spontaneous 8 1.6  Total 10  2.0     Desaturation threshold setting: 4% Minimum desaturation setting: 10 seconds SaO2 nadir: 67% The longest event was a 40 sec obstructive Apnea with a minimum SaO2 of 90%. The lowest SaO2 was 84% associated with a 19 sec obstructive Hypopnea. EKG Rates EKG Avg Max Min  Awake 84 93 75  Asleep 79 92 72  EKG  Events: Tachycardia

## 2023-07-28 DIAGNOSIS — M5416 Radiculopathy, lumbar region: Secondary | ICD-10-CM | POA: Diagnosis not present

## 2023-07-28 DIAGNOSIS — M6281 Muscle weakness (generalized): Secondary | ICD-10-CM | POA: Diagnosis not present

## 2023-07-28 DIAGNOSIS — R2689 Other abnormalities of gait and mobility: Secondary | ICD-10-CM | POA: Diagnosis not present

## 2023-07-28 DIAGNOSIS — R296 Repeated falls: Secondary | ICD-10-CM | POA: Diagnosis not present

## 2023-07-31 ENCOUNTER — Other Ambulatory Visit: Payer: Self-pay | Admitting: *Deleted

## 2023-07-31 ENCOUNTER — Telehealth: Payer: Self-pay | Admitting: *Deleted

## 2023-07-31 DIAGNOSIS — E1122 Type 2 diabetes mellitus with diabetic chronic kidney disease: Secondary | ICD-10-CM | POA: Diagnosis not present

## 2023-07-31 DIAGNOSIS — N182 Chronic kidney disease, stage 2 (mild): Secondary | ICD-10-CM | POA: Diagnosis not present

## 2023-07-31 DIAGNOSIS — F321 Major depressive disorder, single episode, moderate: Secondary | ICD-10-CM | POA: Diagnosis not present

## 2023-07-31 DIAGNOSIS — G4733 Obstructive sleep apnea (adult) (pediatric): Secondary | ICD-10-CM

## 2023-07-31 DIAGNOSIS — E038 Other specified hypothyroidism: Secondary | ICD-10-CM | POA: Diagnosis not present

## 2023-07-31 DIAGNOSIS — D696 Thrombocytopenia, unspecified: Secondary | ICD-10-CM | POA: Diagnosis not present

## 2023-07-31 DIAGNOSIS — G729 Myopathy, unspecified: Secondary | ICD-10-CM | POA: Diagnosis not present

## 2023-07-31 DIAGNOSIS — E782 Mixed hyperlipidemia: Secondary | ICD-10-CM | POA: Diagnosis not present

## 2023-07-31 NOTE — Telephone Encounter (Signed)
 Spoke to patient made her aware of orders placed for new pap machine and supplies by Dr Epimenio Foot . Pt aware when she receives new machine please call our office back to set up a initial f/u with Dr Epimenio Foot .( 30-90 days) after starting using new pap machine . Pt aware have to change her DME from [facility] to [facility] due insurance change . Patient thanked me for update

## 2023-08-01 DIAGNOSIS — M6281 Muscle weakness (generalized): Secondary | ICD-10-CM | POA: Diagnosis not present

## 2023-08-01 DIAGNOSIS — R296 Repeated falls: Secondary | ICD-10-CM | POA: Diagnosis not present

## 2023-08-01 DIAGNOSIS — M5416 Radiculopathy, lumbar region: Secondary | ICD-10-CM | POA: Diagnosis not present

## 2023-08-01 DIAGNOSIS — R2689 Other abnormalities of gait and mobility: Secondary | ICD-10-CM | POA: Diagnosis not present

## 2023-08-03 DIAGNOSIS — M6281 Muscle weakness (generalized): Secondary | ICD-10-CM | POA: Diagnosis not present

## 2023-08-03 DIAGNOSIS — R296 Repeated falls: Secondary | ICD-10-CM | POA: Diagnosis not present

## 2023-08-03 DIAGNOSIS — M5416 Radiculopathy, lumbar region: Secondary | ICD-10-CM | POA: Diagnosis not present

## 2023-08-03 DIAGNOSIS — R2689 Other abnormalities of gait and mobility: Secondary | ICD-10-CM | POA: Diagnosis not present

## 2023-08-07 DIAGNOSIS — D696 Thrombocytopenia, unspecified: Secondary | ICD-10-CM | POA: Diagnosis not present

## 2023-08-07 DIAGNOSIS — G35 Multiple sclerosis: Secondary | ICD-10-CM | POA: Diagnosis not present

## 2023-08-07 DIAGNOSIS — E1122 Type 2 diabetes mellitus with diabetic chronic kidney disease: Secondary | ICD-10-CM | POA: Diagnosis not present

## 2023-08-07 DIAGNOSIS — N182 Chronic kidney disease, stage 2 (mild): Secondary | ICD-10-CM | POA: Diagnosis not present

## 2023-08-07 NOTE — Telephone Encounter (Signed)
 New, Doristine Mango, RN; Jacqualin Combes; Kathe Becton; 1 other Received, thank you!     Previous Messages    ----- Message ----- From: Guy Begin, RN Sent: 08/02/2023   4:57 PM EDT To: Kathyrn Sheriff; Santina Evans; Kathe Becton; * Subject: new order for VPAP                            New order in epic  [facility] A. Helen Sandoval" Female, 63 y.o., 1960-11-10 MRN: [id number]   Thank you  Andrey Campanile

## 2023-08-09 ENCOUNTER — Other Ambulatory Visit: Payer: Self-pay | Admitting: Nurse Practitioner

## 2023-08-09 DIAGNOSIS — R296 Repeated falls: Secondary | ICD-10-CM | POA: Diagnosis not present

## 2023-08-09 DIAGNOSIS — M5416 Radiculopathy, lumbar region: Secondary | ICD-10-CM | POA: Diagnosis not present

## 2023-08-09 DIAGNOSIS — M6281 Muscle weakness (generalized): Secondary | ICD-10-CM | POA: Diagnosis not present

## 2023-08-09 DIAGNOSIS — R2689 Other abnormalities of gait and mobility: Secondary | ICD-10-CM | POA: Diagnosis not present

## 2023-08-11 DIAGNOSIS — M6281 Muscle weakness (generalized): Secondary | ICD-10-CM | POA: Diagnosis not present

## 2023-08-11 DIAGNOSIS — M5416 Radiculopathy, lumbar region: Secondary | ICD-10-CM | POA: Diagnosis not present

## 2023-08-11 DIAGNOSIS — R2689 Other abnormalities of gait and mobility: Secondary | ICD-10-CM | POA: Diagnosis not present

## 2023-08-11 DIAGNOSIS — R296 Repeated falls: Secondary | ICD-10-CM | POA: Diagnosis not present

## 2023-08-11 NOTE — Telephone Encounter (Signed)
 Marland Kitchen

## 2023-08-13 ENCOUNTER — Other Ambulatory Visit: Payer: Self-pay | Admitting: Neurology

## 2023-08-14 ENCOUNTER — Other Ambulatory Visit: Payer: Self-pay | Admitting: Neurology

## 2023-08-14 NOTE — Telephone Encounter (Signed)
 Last seen on 05/09/23 Follow up scheduled on 08/21/23

## 2023-08-14 NOTE — Telephone Encounter (Signed)
 Spoke to patient made initial f/u appoinment with Dr.Sater 10/2023

## 2023-08-15 DIAGNOSIS — M6281 Muscle weakness (generalized): Secondary | ICD-10-CM | POA: Diagnosis not present

## 2023-08-15 DIAGNOSIS — R2689 Other abnormalities of gait and mobility: Secondary | ICD-10-CM | POA: Diagnosis not present

## 2023-08-15 DIAGNOSIS — M5416 Radiculopathy, lumbar region: Secondary | ICD-10-CM | POA: Diagnosis not present

## 2023-08-15 DIAGNOSIS — R296 Repeated falls: Secondary | ICD-10-CM | POA: Diagnosis not present

## 2023-08-17 DIAGNOSIS — M6281 Muscle weakness (generalized): Secondary | ICD-10-CM | POA: Diagnosis not present

## 2023-08-17 DIAGNOSIS — R296 Repeated falls: Secondary | ICD-10-CM | POA: Diagnosis not present

## 2023-08-17 DIAGNOSIS — M5416 Radiculopathy, lumbar region: Secondary | ICD-10-CM | POA: Diagnosis not present

## 2023-08-17 DIAGNOSIS — R2689 Other abnormalities of gait and mobility: Secondary | ICD-10-CM | POA: Diagnosis not present

## 2023-08-21 ENCOUNTER — Ambulatory Visit (INDEPENDENT_AMBULATORY_CARE_PROVIDER_SITE_OTHER): Admitting: Neurology

## 2023-08-21 ENCOUNTER — Encounter: Payer: Self-pay | Admitting: Neurology

## 2023-08-21 VITALS — BP 135/88 | HR 96 | Ht 64.0 in | Wt 190.0 lb

## 2023-08-21 DIAGNOSIS — E039 Hypothyroidism, unspecified: Secondary | ICD-10-CM | POA: Diagnosis not present

## 2023-08-21 DIAGNOSIS — F418 Other specified anxiety disorders: Secondary | ICD-10-CM

## 2023-08-21 DIAGNOSIS — R269 Unspecified abnormalities of gait and mobility: Secondary | ICD-10-CM

## 2023-08-21 DIAGNOSIS — R5383 Other fatigue: Secondary | ICD-10-CM | POA: Diagnosis not present

## 2023-08-21 DIAGNOSIS — G35 Multiple sclerosis: Secondary | ICD-10-CM

## 2023-08-21 DIAGNOSIS — R413 Other amnesia: Secondary | ICD-10-CM

## 2023-08-21 DIAGNOSIS — E538 Deficiency of other specified B group vitamins: Secondary | ICD-10-CM

## 2023-08-21 NOTE — Progress Notes (Signed)
 [facility]  PATIENT: Helen Sandoval DOB: [birth year]  REFERRING DOCTOR OR PCP:  Anselm Pancoast. ([facility])   Lynn Ito (new PCP at [facility]) SOURCE: patient and records from [facility]  _________________________________   HISTORICAL  CHIEF COMPLAINT:  Chief Complaint  Patient presents with   New Patient (Initial Visit)    Pt in 10 with sister Pt here for Memory decline  Pt and sister states long and short term memory is declining     HISTORY OF PRESENT ILLNESS:  Helen Sandoval is a 63yo woman who was diagnosed with relapsing remitting MS in 2015.     Update 08/21/2023 She has noted more issues with STM x 6 months, worsening after her mom passed in 04-10-23.  Prior to this time, she would sometimes be forgetful though had no difficulty with activities of daily living.  Her sister has needed to help her more over the past few months.  Besides difficulty with memory, she also has reduced processing and has more difficulty doing complex tasks  We discussed that memory problems could be multifactorial.  We know that she has several medical conditions that might play a role in reduced focus/attention and subsequently memory including anxiety/depression, multiple sclerosis and sleep apnea.  Although statistically, Alzheimer's disease is rare at her age it can occur and this needs to be considered..  Her MS appears to be stable.  Specifically, MRIs of the brain in 2022 and 2024 showed no new lesions.  She is not on a DMT but completed the second year of Mavenclad in April 2021.   After the first Mavenclad, she did well but lymphocyte count was low at 0.1.  Therefore, she did Valtrex for few months.  They bounced back to 0.7 before the second year of Mavenclad.     Her most recent lymphocyte count from 09/28/2022 was low normal at 0.8.  She also has mildly low platelet count     Gait is doing about the same she trips some but no falls due to left foot  drop.Marland Kitchen  Her legs feel a bit weak, similar to last year.  Her legs shake going downstairs due to weakness.  She does better going up.  She notes some dysesthesias helped by gabapentin.  She has left > right leg weakness.   If she squats she has trouble getting up..  When she shows me where the pain is, she points to the region of the trochanteric bursa.  She notes that area is tender if she pushes  The tremors in her hands and face are doing worse.  Eating, writing and other activities with the hands is more difficult.  She has chronic bronchitis so beta-blockers are somewhat contraindicated..    She no longer smokes.  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  Of note, her mother also had tremors.  Clonazepam may help a little bit.   She has depression and anxiety and is on Effexor, buspirone and wellbutrin.   Her mom died in 09-Apr-2024 and she was much worse (also taking extra doses of med's).    She saw psychiatry.      Incontinence is better with Myrbetriq.    Vision is unchanged.     She is on VPAP/BiPAP with PS5.  When she was on BiPAP, she had excellent compliance and efficacy.     She also has some insomnia.    She has lost weight after being diagnosed  with NIDDM type 2 .  She  is now on Mounjoro with metformin and has lost 11 pounds.  Her HgbA1c is better.   She has NAFLD (Fatty liver).    Vascular risks:   She has NIDDM.  HTN, Tobacco x 35-40 years  MS History:    In 2008, she had headaches and poor balance and was told the MRI had some white matter foci at that time.    She had spinal stenosis and needed surgery in 2014.  The surgeon felt her gait and other issues were decreased out of proportion to the extent of spine disease and referred her to Neurology.   Besides gait issues, she also had double vision, blurry vision, cognitive difficulty and incontinence in early 2015. She saw Dr. Vernie Ammons in [place]. An MRI of the brain, CSF and visual evoked potentials were consistent with  multiple sclerosis.    Initially, she was placed on Tecfidera. Due to progression with more neurologic symptoms, she was switched to Tysabri.   Her last MRI was done 10/2014 but we do not have the images.    Imaging: MRI brain 05/14/2020 shows T2/FLAIR hyperintense foci in the hemispheres, pons and left basal ganglia.  Most of the foci are nonspecific.  A few are periventricular and radially oriented.  The focus in the left basal ganglia could also represent ischemic sequela.    None of the foci appear to be acute and they do not enhance.    There are no new lesions compared with 05/12/2018 MRI.  MRI of the cervical spine shows 1 or 2 small subtle foci within the spinal cord adjacent to C4-C5 and C6.  These have been seen on previous MRIs.  Though nonspecific, this is consistent with a chronic demyelinating plaque.  Minimal chronic compressive myelopathic signal cannot be ruled out.  There are no new lesions and no enhancing lesions.   Prior C4-C6 ACDF.  There is no nerve root compression or spinal stenosis at these levels.   Mild spinal stenosis and mild foraminal narrowing at C3-C4 and C6-C7 that does not lead to any nerve root compression.  Degenerative changes are stable compared to the 2020 MRI.  MRI of the brain 05/19/2021 showed no new lesions.  MRI of the cervical spine 05/19/2021 showed no new lesions.  MRI of the brain and cervical spine 11/2022 showed no new MS lesions.  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 There is moderate facet hypertrophy at L4-L5 with moderately severe facet hypertrophy at L5-S1.    REVIEW OF SYSTEMS: Constitutional: No fevers, chills, sweats, or change in appetite.   She notes fatigue Eyes: Reports visual changes and double vision.  No eye pain Ear, nose and throat: No hearing loss, ear pain, nasal  congestion, sore throat Cardiovascular: No chest pain, palpitations Respiratory:  No shortness of breath at rest or with exertion.   She has snoring and coughing but no wheezes GastrointestinaI: No nausea, vomiting, abdominal pain.  She notes diarrhea and some fecal incontinence Genitourinary:  No dysuria, urinary retention or frequency. She has had some incontinence Musculoskeletal:  No neck pain, back pain Integumentary: No rash, pruritus, skin lesions Neurological: as above Psychiatric: Some depression at this time.  Some anxiety Endocrine: No palpitations, diaphoresis, change in appetite, change in weigh or increased thirst Hematologic/Lymphatic:  No anemia, purpura, petechiae. Allergic/Immunologic: No itchy/runny eyes, nasal congestion, recent allergic  reactions, rashes  ALLERGIES: Allergies  Allergen Reactions   Clindamycin Anaphylaxis   Indomethacin Hives   Levofloxacin Hives   Penicillins Anaphylaxis   Penicillins Cross Reactors Anaphylaxis   Sulfa Antibiotics Swelling    Blister (steven john syndrome)   Levaquin [Levofloxacin Hemihydrate] Hives   Lipitor  [Atorvastatin Calcium]    Multihance [Gadobenate] Hives    Pt having hives/ rash on neck and chest    Sulfamethoxazole-Trimethoprim    Zocor [Simvastatin] Other (See Comments)    Other reaction(s): MUSCLE PAIN Muscle aches    HOME MEDICATIONS:  Current Outpatient Medications:    albuterol (PROVENTIL) (2.5 MG/3ML) 0.083% nebulizer solution, Take 3 mLs (2.5 mg total) by nebulization every 6 (six) hours as needed for wheezing or shortness of breath., Disp: 75 mL, Rfl: 12   albuterol (VENTOLIN HFA) 108 (90 Base) MCG/ACT inhaler, Inhale 2 puffs into the lungs every 6 (six) hours as needed for wheezing or shortness of breath., Disp: 8 g, Rfl: 6   aspirin EC 81 MG tablet, Take 81 mg by mouth every other day., Disp: , Rfl:    buPROPion (WELLBUTRIN XL) 300 MG 24 hr tablet, Take 1 tablet (300 mg total) by mouth in the morning.,  Disp: 30 tablet, Rfl: 1   busPIRone (BUSPAR) 30 MG tablet, Take 1 tablet (30 mg total) by mouth 2 (two) times daily., Disp: 60 tablet, Rfl: 2   Cladribine (MAVENCLAD, 9 TABS, PO), Take by mouth., Disp: , Rfl:    clonazePAM (KLONOPIN) 1 MG tablet, Take 1 tablet (1 mg total) by mouth 2 (two) times daily., Disp: 60 tablet, Rfl: 5   cyclobenzaprine (FLEXERIL) 10 MG tablet, Take 1 tablet (10 mg total) by mouth at bedtime. (Patient taking differently: Take 10 mg by mouth 3 (three) times daily as needed.), Disp: 90 tablet, Rfl: 3   ezetimibe (ZETIA) 10 MG tablet, Take 10 mg by mouth daily. , Disp: , Rfl:    fluticasone-salmeterol (WIXELA INHUB) 100-50 MCG/ACT AEPB, Inhale 1 puff into the lungs 2 (two) times daily., Disp: 60 each, Rfl: 12   gabapentin (NEURONTIN) 300 MG capsule, TAKE 1 CAPSULE BY MOUTH THREE TIMES DAILY, Disp: 270 capsule, Rfl: 0   ipratropium (ATROVENT) 0.03 % nasal spray, Place 2 sprays into both nostrils daily at 2 PM., Disp: , Rfl:    Melatonin 10 MG TABS, Take 10 mg by mouth daily., Disp: , Rfl:    metFORMIN (GLUCOPHAGE-XR) 500 MG 24 hr tablet, Take 2 tablets by mouth 2 (two) times daily with a meal. 1 tablet in morning and 2 tablets at night, Disp: , Rfl:    MYRBETRIQ 50 MG TB24 tablet, Take 1 tablet by mouth once daily, Disp: 90 tablet, Rfl: 0   pantoprazole (PROTONIX) 40 MG tablet, Take 40 mg by mouth every morning., Disp: , Rfl:    phentermine 37.5 MG capsule, Take 1 capsule (37.5 mg total) by mouth every morning., Disp: 90 capsule, Rfl: 1   Spacer/Aero-Holding Chambers (EQ SPACE CHAMBER ANTI-STATIC) DEVI, USE AS DIRECTED WITH  INHALERS, Disp: 1 each, Rfl: 0   tirzepatide (MOUNJARO) 7.5 MG/0.5ML Pen, Inject 7.5 mg into the skin once a week., Disp: , Rfl:    varenicline (CHANTIX) 1 MG tablet, Take 1 tablet by mouth twice daily, Disp: 60 tablet, Rfl: 1   venlafaxine XR (EFFEXOR XR) 75 MG 24 hr capsule, Take 3 capsules (225 mg total) by mouth daily., Disp: 30 capsule, Rfl: 2   zinc  gluconate 50 MG tablet, Take 50 mg by  mouth daily., Disp: , Rfl:    cholecalciferol (VITAMIN D) 1000 UNITS tablet, Take 5,000 Units by mouth daily., Disp: , Rfl:   PAST MEDICAL HISTORY: Past Medical History:  Diagnosis Date   Ankle fracture    Depression    Diabetes (HCC)    Fatty liver    Osteopenia    Osteoporosis    Scarlet fever    Shingles    Sleep apnea    Spina bifida    Splenomegaly    Thrombocytopenia (HCC)    Vitamin D deficiency     PAST SURGICAL HISTORY: Past Surgical History:  Procedure Laterality Date   ABDOMINAL HYSTERECTOMY     fibroids and heavy bleeding   BREAST BIOPSY Right    20+ yrs ago   CERVICAL FUSION      FAMILY HISTORY: Family History  Problem Relation Age of Onset   Bladder Cancer Mother    Colon cancer Mother    Colon cancer Father        diagnosed less than age of 43.    Bladder Cancer Maternal Grandmother    Leukemia Maternal Grandmother    Alzheimer's disease Neg Hx     SOCIAL HISTORY:  Social History   Socioeconomic History   Marital status: Divorced    Spouse name: Not on file   Number of children: Not on file   Years of education: Not on file   Highest education level: Not on file  Occupational History   Not on file  Tobacco Use   Smoking status: Former    Current packs/day: 0.00    Types: Cigarettes    Quit date: 11/21/2022    Years since quitting: 0.7   Smokeless tobacco: Never   Tobacco comments:    1 cigarrettes per day. 09/28/2022 [place]  Substance and Sexual Activity   Alcohol use: Yes    Comment: occasional use. a few times a year.    Drug use: No   Sexual activity: Not on file  Other Topics Concern   Not on file  Social History Narrative   Pt lives with sister    Disability    Social Drivers of Health   Financial Resource Strain: Not on file  Food Insecurity: Not on file  Transportation Needs: Not on file  Physical Activity: Not on file  Stress: Not on file  Social Connections: Not on file  Intimate  Partner Violence: Not on file     PHYSICAL EXAM  Vitals:   08/21/23 1027 08/21/23 1038  BP: (!) 151/91 135/88  Pulse: 98 96  Weight: 190 lb (86.2 kg)   Height: 5\' 4"  (1.626 m)     Body mass index is 32.61 kg/m.   General: The patient is well-developed and well-nourished and in no acute distress  Neck: The neck is supple with good ROM and nontender.    Musculoskeletal:  She has tenderness over the trochanteric bursa of the left hip.  No tenderness over SI joint or piriformis muscles  Neurologic Exam  Mental status: The patient is alert and oriented x 3 at the time of the examination.  During the interaction today, she has apparent normal recent and remote memory, with an reduced normal attention span and concentration ability (100-93-? But WRLD/DLROW and 20-17-14-11-8-5).   Speech is normal.MMSE 20/30 using serial 7's (would be 24 with alternatives).    Cranial nerves: Extraocular movements appear normal.  Facial strength was normal.  Trapezius strength is normal.  No dysarthria is noted.  Hearing  appears normal and symmetric.  Motor: Low amplitude fast tremor in the hands and mouth, worsened with intention.  Muscle bulk is normal.   Tone is normal. Strength is  5 / 5 in all 4 extremities.   Sensory: She reports slightly reduced sensation to vibration in the right leg    Coordination:  finger-nose-finger is performed well.  Heel-to-shin is performed well.  Gait and station: Station is normal.  She has a mildly reduced stride..  Tandem gait is poor.  Romberg is negative.  Reflexes: Deep tendon reflexes are symmetric and 2+ arms and 3+ knees with spread.   There is no ankle clonus..    ASSESSMENT AND PLAN    1. Multiple sclerosis (HCC)   2. Memory loss   3. Other fatigue   4. Gait abnormality   5. Depression with anxiety   6. B12 deficiency        1.   She successfully completed her second course of Mavenclad April 2021.    Last MRI in 2022 showed no new lesions.    MS has been stable  2.   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   She is not a beta blocker candidate due to wheezing/IDDM.   If the tremor gets a lot worse we could consider Mysoline or a benzodiazepine 3.   Continue VPAP with PS 5.  She uses it nightly.  Advised to try to lose some weight.   She has had good compliance and efficacy on downloads.      4.   Continue Phentermine 37.5 mg in the morning for sleepiness, weight loss and fatigue.    5.  We had a long discussion about her cognitive difficulties.  Today, she scored 20/30 on the Mini-Mental status exam preparatory to mild dementia range.  Of note, she lost 4 points for serial sevens but was able to spell world forwards and backwards and could do serial threes so she may be more in the mild cognitive impairment range.  We will check the amyloid beta 42/40 ratio and [id number] biomarkers.  Due to also having MS, NFL would not be a valid biomarker for her.        If consistent with AD, add donepezil and consider further testing with amyloid PET if she has interest in the amyloid monoclonal antibody treatments    6.   She is seeing psychiatry.  Medications have been changed.   7.  Return in 4 months or sooner if there are new or worsening neurologic symptoms.   40-minute office visit with the majority of the time spent face-to-face for history and physical, discussion/counseling and decision-making.  Additional time with record review and documentation.   This visit is part of a comprehensive longitudinal care medical relationship regarding the patients primary diagnosis of MS and related concerns.   Janson Lamar A. Epimenio Foot, MD, PhD 08/21/2023, 11:02 AM Certified in Neurology, Clinical Neurophysiology, Sleep Medicine, Pain Medicine and Neuroimaging . [facility] [address] [place], [place] [address] [phone]

## 2023-08-21 NOTE — Progress Notes (Unsigned)
 BH MD/PA/NP OP Progress Note  08/22/2023 1:46 PM Helen Sandoval  MRN:  [id number]  Visit Diagnosis:    ICD-10-CM   1. Severe episode of recurrent major depressive disorder, without psychotic features (HCC)  F33.2 venlafaxine XR (EFFEXOR XR) 150 MG 24 hr capsule    buPROPion (WELLBUTRIN XL) 300 MG 24 hr tablet    traZODone (DESYREL) 50 MG tablet    2. 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 Encounter for long-term (current) use of medications  Z79.899 VITAMIN D 25 Hydroxy (Vit-D Deficiency, Fractures)    VITAMIN D 25 Hydroxy (Vit-D Deficiency, Fractures)      Assessment: Helen Sandoval is a 63 y.o. female with a history of MDD, relapsing remitting multiple sclerosis (diagnosed 2015), T2DM, OSA, essential tremor, chronic low back pain, and Vitamin D deficiency who presented to [facility] for initial evaluation on 07/25/2023.    At initial evaluation patient reported symptoms of depression including low mood, anhedonia, amotivation, decreased tearfulness, feelings of hopelessness, and passive SI. She consistently denied active SI and there are no acute safety concerns at this time.  Crisis resources were reviewed.  Notably patient's mother who had been significant support in her life passed in November 2024.  While patient denied depressive symptoms worsening at that time she had endorsed otherwise at visit with another provider.  Symptoms progressed past what would be consistent with typical grief response.  Notably patient does have a diagnosis of MS was made in 2015 and it is around this time that her depressive symptoms first onset.  She has undergone numerous medication trials since with periods of improvement followed by loss of benefit from medication.  Patient met criteria for MDD and late sleep phase syndrome on initial evaluation.  Helen Sandoval presents for follow-up evaluation. Today, 08/22/23, patient reports continued improvement in mood  following taper of venlafaxine to 225 and increase in BuSpar to 30 mg twice a day.  She has also decreased the Klonopin to 0.5 mg at bedtime.  Patient is able to identify an improvement in energy and motivation.  She has engaged in some task around the house that she had been unable to do previously.  We will continue to taper venlafaxine to 150 mg a day with plan to start the transition to alternative antidepressant next appointment.  We will also start trazodone 50 mg for insomnia.  Patient will discuss with her neurologist if continuing Klonopin long-term is necessary.  She also is undergoing screening for potential dementia as memory issues remain a concern.  Potential contributing factors include patient's depression, MS, and ongoing benzodiazepine use.  Psychotherapeutic interventions were used during today's session. From 11:35 AM to 11:57 AM we used empathic listening techniques and provided support. Used supportive interviewing techniques to validate patients feelings. Worked on cognitive re framing techniques and focusing on behavioral activation.  Improvement was evidenced by patient's participation.   Plan: Bubble Pack all medications (unable to do a current pharmacy patient considering switching to a new one) - Decrease Effexor to 150 mg daily, plan to cross taper to another medication at next appointment - Continue Buspar 30 mg BID - Continue Wellbutrin XL 300 mg daily - Continue Gabapentin 300 mg BID prescribed by neuro - Start Trazodone 50 mg at bedtime - Recommend decreasing Klonopin to 0.5 mg QHS prescribed by Neuro (per pt this is what she is currently taking) - Phentermine 37.5 mg daily managed by neurology - R/o  contributing medical conditions: CBC, TSH, CMP grossly wnl 10/26/2022;   -Vitamin D level repeated - Therapy referral  - Information for MS support groups provided - Sleep study completed today, already on BIPAP - Crisis resources reviewed - Follow up ina  month   Chief Complaint:  Chief Complaint  Patient presents with   Follow-up   HPI: Helen Sandoval presents alongside her sister who participated in the session with patient's approval.  Helen Sandoval believes that she has done better this past month in terms of anxiety and depression.  She has noticed that she is no longer feeling as hopeless or worthless, and has seen evidence of improvement including her increased ability to complete tasks.  Patient states that she is not as bedbound as she had been previously and instead is up moving around taking care of some chores and has started cooking again.  She can be a bit negative with her self on things feeling that she has not made progress.  Patient was reminded to identify and reward herself for the achievements without minimizing things just because she perceives them as small.  Memory remains a concern and there are several factors that could be contributing to this.  Patient's depression, MS, benzodiazepine use, and potential dementia.  She is being screened for Alzheimer's markers and was referred for neuropsych evaluation.  In regards to benzodiazepine use patient's sister has decreased the dosing to half milligram in the evening.  This is a significant improvement from the 1 mg twice a day.  We did review the adverse side effects of long-term benzodiazepine use and suggested patient continue to discuss with her neurologist on the benefits of this medication.  1 potential use for patient reported is insomnia.  Discussed this and suggested a retrial of trazodone as she has had good effect with this in the past.  We also discussed continuing to taper off venlafaxine with a plan to start an alternative antidepressant in the future.  Patient has tolerated the initial decrease well without adverse side effects.  We will plan to start cross taper to a new medication at next appointment.  In regards to bubble packing medication patient's pharmacy was unable to do so.   She will consider switching to an alternative pharmacy that might be better able to do this.  She also looking into getting a daily pill dispenser.  Past Psychiatric History: Diagnoses: MDD Medication trials: Zoloft (lost effectiveness); Prozac (ineffective); Cymbalta (appears to have been brief trial); Trazodone (loss of effect over time), Viibryd (couldn't get insurance to cover); Xanax; Adderall (inattention, lost effect over time) Previous psychiatrist/therapist: Dr. Josephina Shih for an initial eval in January of 2025 Hospitalizations: denies Suicide attempts: denies SIB: denies Hx of violence towards others: denies   Substance use:  -- Etoh: 1 glass wine every few months  -- Tobacco: quit 11/20/22; on Chantix  -- Cannabis: denies  -- Illicit drugs: denies  -- Caffeine: 1 soda daily; denies use of coffee, tea, energy drinks  Past Medical History:  Past Medical History:  Diagnosis Date   Ankle fracture    Depression    Diabetes (HCC)    Fatty liver    Osteopenia    Osteoporosis    Scarlet fever    Shingles    Sleep apnea    Spina bifida    Splenomegaly    Thrombocytopenia (HCC)    Vitamin D deficiency     Past Surgical History:  Procedure Laterality Date   ABDOMINAL HYSTERECTOMY  fibroids and heavy bleeding   BREAST BIOPSY Right    20+ yrs ago   CERVICAL FUSION     Family History:  Family History  Problem Relation Age of Onset   Bladder Cancer Mother    Colon cancer Mother    Colon cancer Father        diagnosed less than age of 50.    Bladder Cancer Maternal Grandmother    Leukemia Maternal Grandmother    Alzheimer's disease Neg Hx     Social History:  Social History   Socioeconomic History   Marital status: Divorced    Spouse name: Not on file   Number of children: Not on file   Years of education: Not on file   Highest education level: Not on file  Occupational History   Not on file  Tobacco Use   Smoking status: Former    Current packs/day:  0.00    Types: Cigarettes    Quit date: 11/21/2022    Years since quitting: 0.7   Smokeless tobacco: Never   Tobacco comments:    1 cigarrettes per day. 09/28/2022 [place]  Substance and Sexual Activity   Alcohol use: Yes    Comment: occasional use. a few times a year.    Drug use: No   Sexual activity: Not on file  Other Topics Concern   Not on file  Social History Narrative   Pt lives with sister    Disability    Social Drivers of Health   Financial Resource Strain: Not on file  Food Insecurity: Not on file  Transportation Needs: Not on file  Physical Activity: Not on file  Stress: Not on file  Social Connections: Not on file    Allergies:  Allergies  Allergen Reactions   Clindamycin Anaphylaxis   Indomethacin Hives   Levofloxacin Hives   Penicillins Anaphylaxis   Penicillins Cross Reactors Anaphylaxis   Sulfa Antibiotics Swelling    Blister (steven john syndrome)   Levaquin [Levofloxacin Hemihydrate] Hives   Lipitor  [Atorvastatin Calcium]    Multihance [Gadobenate] Hives    Pt having hives/ rash on neck and chest    Sulfamethoxazole-Trimethoprim    Zocor [Simvastatin] Other (See Comments)    Other reaction(s): MUSCLE PAIN Muscle aches    Current Medications: Current Outpatient Medications  Medication Sig Dispense Refill   traZODone (DESYREL) 50 MG tablet Take 1 tablet (50 mg total) by mouth at bedtime. 30 tablet 1   albuterol (PROVENTIL) (2.5 MG/3ML) 0.083% nebulizer solution Take 3 mLs (2.5 mg total) by nebulization every 6 (six) hours as needed for wheezing or shortness of breath. 75 mL 12   albuterol (VENTOLIN HFA) 108 (90 Base) MCG/ACT inhaler Inhale 2 puffs into the lungs every 6 (six) hours as needed for wheezing or shortness of breath. 8 g 6   aspirin EC 81 MG tablet Take 81 mg by mouth every other day.     buPROPion (WELLBUTRIN XL) 300 MG 24 hr tablet Take 1 tablet (300 mg total) by mouth in the morning. 30 tablet 2   busPIRone (BUSPAR) 30 MG tablet Take 1  tablet (30 mg total) by mouth 2 (two) times daily. 60 tablet 2   Cladribine (MAVENCLAD, 9 TABS, PO) Take by mouth.     clonazePAM (KLONOPIN) 1 MG tablet Take 1 tablet (1 mg total) by mouth 2 (two) times daily. 60 tablet 5   cyclobenzaprine (FLEXERIL) 10 MG tablet Take 1 tablet (10 mg total) by mouth at bedtime. (Patient taking  differently: Take 10 mg by mouth 3 (three) times daily as needed.) 90 tablet 3   ezetimibe (ZETIA) 10 MG tablet Take 10 mg by mouth daily.      fluticasone-salmeterol (WIXELA INHUB) 100-50 MCG/ACT AEPB Inhale 1 puff into the lungs 2 (two) times daily. 60 each 12   gabapentin (NEURONTIN) 300 MG capsule TAKE 1 CAPSULE BY MOUTH THREE TIMES DAILY 270 capsule 0   ipratropium (ATROVENT) 0.03 % nasal spray Place 2 sprays into both nostrils daily at 2 PM.     Melatonin 10 MG TABS Take 10 mg by mouth daily.     metFORMIN (GLUCOPHAGE-XR) 500 MG 24 hr tablet Take 2 tablets by mouth 2 (two) times daily with a meal. 1 tablet in morning and 2 tablets at night     MYRBETRIQ 50 MG TB24 tablet Take 1 tablet by mouth once daily 90 tablet 0   pantoprazole (PROTONIX) 40 MG tablet Take 40 mg by mouth every morning.     phentermine 37.5 MG capsule Take 1 capsule (37.5 mg total) by mouth every morning. 90 capsule 1   Spacer/Aero-Holding Chambers (EQ SPACE CHAMBER ANTI-STATIC) DEVI USE AS DIRECTED WITH  INHALERS 1 each 0   tirzepatide (MOUNJARO) 7.5 MG/0.5ML Pen Inject 7.5 mg into the skin once a week.     varenicline (CHANTIX) 1 MG tablet Take 1 tablet by mouth twice daily 60 tablet 1   venlafaxine XR (EFFEXOR XR) 150 MG 24 hr capsule Take 1 capsule (150 mg total) by mouth daily. 30 capsule 2   zinc gluconate 50 MG tablet Take 50 mg by mouth daily.     No current facility-administered medications for this visit.     Musculoskeletal: Strength & Muscle Tone: within normal limits Gait & Station: normal Patient leans: N/A  Psychiatric Specialty Exam: Review of Systems  Blood pressure (!)  147/93, pulse 96, height 5\' 4"  (1.626 m), weight 192 lb (87.1 kg).Body mass index is 32.96 kg/m.  General Appearance: Fairly Groomed  Eye Contact:  Good  Speech:  Clear and Coherent  Volume:  Normal  Mood:  Euthymic  Affect:  Appropriate  Thought Process:  Coherent  Orientation:  Full (Time, Place, and Person)  Thought Content: Logical   Suicidal Thoughts:  No  Homicidal Thoughts:  No  Memory:  Immediate;   Fair  Judgement:  Good  Insight:  Fair  Psychomotor Activity:  Normal  Concentration:  Concentration: Fair  Recall:  Fair  Fund of Knowledge: Good  Language: Good  Akathisia:  NA    AIMS (if indicated): not done  Assets:  Communication Skills Desire for Improvement Financial Resources/Insurance Housing Transportation  ADL's:  Intact  Cognition: WNL  Sleep:  Fair   Metabolic Disorder Labs: No results found for: "HGBA1C", "MPG" No results found for: "PROLACTIN" No results found for: "CHOL", "TRIG", "HDL", "CHOLHDL", "VLDL", "LDLCALC" Lab Results  Component Value Date   TSH 5.340 (H) 08/21/2023   TSH 3.080 10/26/2022    Therapeutic Level Labs: No results found for: "LITHIUM" No results found for: "VALPROATE" No results found for: "CBMZ"   Screenings: Mini-Mental    Flowsheet Row Office Visit from 08/21/2023 in [facility]  Total Score (max 30 points ) 20       Collaboration of Care: Collaboration of Care: Medication Management AEB medication prescription and Other provider involved in patient's care AEB neurology chart review  Patient/Guardian was advised Release of Information must be obtained prior to any record release in  order to collaborate their care with an outside provider. Patient/Guardian was advised if they have not already done so to contact the registration department to sign all necessary forms in order for [place] to release information regarding their care.   Consent: Patient/Guardian gives verbal consent for  treatment and assignment of benefits for services provided during this visit. Patient/Guardian expressed understanding and agreed to proceed.    Stasia Cavalier, MD 08/22/2023, 1:46 PM

## 2023-08-22 ENCOUNTER — Encounter (HOSPITAL_COMMUNITY): Payer: Self-pay | Admitting: Psychiatry

## 2023-08-22 ENCOUNTER — Telehealth: Payer: Self-pay

## 2023-08-22 ENCOUNTER — Ambulatory Visit (HOSPITAL_COMMUNITY): Admitting: Psychiatry

## 2023-08-22 VITALS — BP 147/93 | HR 96 | Ht 64.0 in | Wt 192.0 lb

## 2023-08-22 DIAGNOSIS — F332 Major depressive disorder, recurrent severe without psychotic features: Secondary | ICD-10-CM | POA: Diagnosis not present

## 2023-08-22 DIAGNOSIS — Z6833 Body mass index (BMI) 33.0-33.9, adult: Secondary | ICD-10-CM | POA: Diagnosis not present

## 2023-08-22 DIAGNOSIS — G4721 Circadian rhythm sleep disorder, delayed sleep phase type: Secondary | ICD-10-CM | POA: Diagnosis not present

## 2023-08-22 DIAGNOSIS — Z79899 Other long term (current) drug therapy: Secondary | ICD-10-CM | POA: Diagnosis not present

## 2023-08-22 MED ORDER — VENLAFAXINE HCL ER 150 MG PO CP24: 150.0000 mg | ORAL_CAPSULE | Freq: Every day | ORAL | 2 refills | Status: DC

## 2023-08-22 MED ORDER — TRAZODONE HCL 50 MG PO TABS: 50.0000 mg | ORAL_TABLET | Freq: Every day | ORAL | 1 refills | Status: DC

## 2023-08-22 MED ORDER — BUPROPION HCL ER (XL) 300 MG PO TB24: 300.0000 mg | ORAL_TABLET | Freq: Every morning | ORAL | 2 refills | Status: DC

## 2023-08-22 NOTE — Telephone Encounter (Signed)
error 

## 2023-08-23 DIAGNOSIS — R296 Repeated falls: Secondary | ICD-10-CM | POA: Diagnosis not present

## 2023-08-23 DIAGNOSIS — M6281 Muscle weakness (generalized): Secondary | ICD-10-CM | POA: Diagnosis not present

## 2023-08-23 DIAGNOSIS — M5416 Radiculopathy, lumbar region: Secondary | ICD-10-CM | POA: Diagnosis not present

## 2023-08-23 DIAGNOSIS — R2689 Other abnormalities of gait and mobility: Secondary | ICD-10-CM | POA: Diagnosis not present

## 2023-08-23 LAB — VITAMIN D 25 HYDROXY (VIT D DEFICIENCY, FRACTURES): Vit D, 25-Hydroxy: 57.9 ng/mL (ref 30.0–100.0)

## 2023-08-24 ENCOUNTER — Encounter: Payer: Self-pay | Admitting: Psychology

## 2023-08-24 ENCOUNTER — Encounter: Payer: Self-pay | Admitting: Neurology

## 2023-08-24 LAB — THYROID PANEL WITH TSH
Free Thyroxine Index: 1.9 (ref 1.2–4.9)
T3 Uptake Ratio: 23 % — ABNORMAL LOW (ref 24–39)
T4, Total: 8.2 ug/dL (ref 4.5–12.0)
TSH: 5.34 u[IU]/mL — ABNORMAL HIGH (ref 0.450–4.500)

## 2023-08-24 LAB — ATN PROFILE
A -- Beta-amyloid 42/40 Ratio: 0.108 (ref >0.102)
Beta-amyloid 40: 208.48 pg/mL
Beta-amyloid 42: 22.48 pg/mL
N -- NfL, Plasma: 2.86 pg/mL (ref 0.00–4.61)
T -- p-tau181: 1.81 pg/mL — ABNORMAL HIGH (ref 0.00–0.97)

## 2023-08-24 LAB — VITAMIN B12: Vitamin B-12: 546 pg/mL (ref 232–1245)

## 2023-08-25 DIAGNOSIS — R296 Repeated falls: Secondary | ICD-10-CM | POA: Diagnosis not present

## 2023-08-25 DIAGNOSIS — M6281 Muscle weakness (generalized): Secondary | ICD-10-CM | POA: Diagnosis not present

## 2023-08-25 DIAGNOSIS — M5416 Radiculopathy, lumbar region: Secondary | ICD-10-CM | POA: Diagnosis not present

## 2023-08-25 DIAGNOSIS — R2689 Other abnormalities of gait and mobility: Secondary | ICD-10-CM | POA: Diagnosis not present

## 2023-08-28 DIAGNOSIS — M6281 Muscle weakness (generalized): Secondary | ICD-10-CM | POA: Diagnosis not present

## 2023-08-28 DIAGNOSIS — M5416 Radiculopathy, lumbar region: Secondary | ICD-10-CM | POA: Diagnosis not present

## 2023-08-28 DIAGNOSIS — R2689 Other abnormalities of gait and mobility: Secondary | ICD-10-CM | POA: Diagnosis not present

## 2023-08-28 DIAGNOSIS — R296 Repeated falls: Secondary | ICD-10-CM | POA: Diagnosis not present

## 2023-08-30 DIAGNOSIS — R296 Repeated falls: Secondary | ICD-10-CM | POA: Diagnosis not present

## 2023-08-30 DIAGNOSIS — M5416 Radiculopathy, lumbar region: Secondary | ICD-10-CM | POA: Diagnosis not present

## 2023-08-30 DIAGNOSIS — R2689 Other abnormalities of gait and mobility: Secondary | ICD-10-CM | POA: Diagnosis not present

## 2023-08-30 DIAGNOSIS — M6281 Muscle weakness (generalized): Secondary | ICD-10-CM | POA: Diagnosis not present

## 2023-08-31 ENCOUNTER — Encounter: Attending: Psychology | Admitting: Psychology

## 2023-08-31 ENCOUNTER — Encounter: Payer: Self-pay | Admitting: Psychology

## 2023-08-31 DIAGNOSIS — R413 Other amnesia: Secondary | ICD-10-CM | POA: Diagnosis not present

## 2023-08-31 NOTE — Progress Notes (Signed)
 NEUROPSYCHOLOGICAL EVALUATION [place]. [facility]  Physical Medicine and Rehabilitation     Patient: Helen Sandoval  MRN: [id number] DOB: April 14, 1961  Age: 63 y.o. Sex: female  Race/Ethnicity: White or Caucasian  Years of Education: 16 Handedness: Right  Referring Provider: Jorie Newness, MD  Provider/Clinical Neuropsychologist: Loletta Ripple, PsyD  Date of Service: 08/31/2023 Start Time: 10 AM End Time: 12 PM  Location of Service:  [facility] Physical Medicine & Rehabilitation Department [place]. [facility] [address] [place] [place], [place] [address] Phone: [phone]  Billing Code/Service:            96116/96121  Individuals Present: Patient was unaccompanied and visit was conducted by provider in provider's office. 1 hour and 15 minutes spent in face-to-face clinical interview and remaining 45 minutes was spent in record review, documentation, and testing protocol construction.    PATIENT CONSENT AND CONFIDENTIALITY The patient's understanding of the reason for referral was intact. Discussed limits of confidentiality including, but not limited to, posting of final evaluation report in the patient's electronic medical record for both the patient and for the referring provider and appropriate medical professionals. Patient was given the opportunity to have their questions answered. The neuropsychological evaluation process was discussed with the patient and they consented to proceed with the evaluation.  Consent for Evaluation and Treatment: Signed: Yes Explanation of Privacy Policies: Signed: Yes Discussion of Confidentiality Limits: Yes  REASON FOR REFERRAL The patient was referred for neuropsychological evaluation by her neurologist, Dr. Godwin Lat, due to concerns for memory loss. The patient's medical history, per records, includes MDD, relapsing remitting MS (diagnosed 2015), T2DM, OSA, essential tremor, and chronic pain. Per records  from her 08/21/23 neurology visit, the the patient had a worsening of short-term memory following the death of her mother in April 25, 2023. The patient scored a 20/30 on the MMSE. In his note, Dr. Godwin Lat states "We discussed that memory problems could be multifactorial. We know that she has several medical conditions that might play a role in reduced focus/attention and subsequently memory including anxiety/depression, multiple sclerosis and sleep apnea. Although statistically, Alzheimer's disease is rare at her age it can occur and this needs to be considered." The patient underwent lab work including ATN profile. Records indicated "A high pTau181 concentration was observed. A normal beta-amyloid 42/40 ratio and normal concentration of NfL were observed at this time. These results are not consistent with the presence of Alzheimer's-related pathology, but may indicate pathology of another condition." Per records from her most recent visit, the patient's "MS appears to be stable."  Upon interview, the patient indicated she hoped to better understand the likely cause of her cognitive symptoms and what might be done to improve them.   HISTORY OF PRESENTING CONCERNS Cognitive Symptom Onset & Course: Upon interview, the patient reported gradual onset of cognitive symptoms and a progressive symptom course.  The patient indicated she noticed problems in memory around five years ago, problems with attention within the last few years, and problems with processing speed within the last several months. She reported that her cognitive symptoms have persisted despite improvements in her mood over the last few months. Memory: The patient reported problems with frequently forgetting conversations, recent events misplacing things and remembering her medications.  She indicated that she is largely able to manage her medical appointments/future obligations via planner without difficulty. Processing Speed: The patient reported  slowed processing speed. Attention & Concentration: The patient reported declines in attention  and concentration. She described difficulties with task completion due to distractibility (e.g., starting new tasks before completing the current one, and failing to return). She indicated that her ability to "multitask" has declined more generally. Language: The patient reported frequent word-finding difficulties.  She denied having any paraphasic errors pointed out to her by others.  She denied any difficulties with receptive language in terms of comprehension aside from problems with reduced perception.  She denied problems with reading or writing when controlling for attention. She reported having greater difficulty with mental arithmetic however.   Visual-Spatial: The patient denied significant visual-spatial problems but  reported 2 instances in which she got lost heading to familiar places.  Executive Functioning: The patient reported struggling with indecision, problem solving, planning, and organization.  She denied having any significant behavioral or personality changes outside of the context of a depressive episode.  Motor/Sensory Complaints: The patient denied any problems or significant changes with her sense of smell or taste.  She reported that she has had progressive declines in her vision and hearing.  She indicated that she wears corrective lenses and is planning to have a hearing test.  The patient's reported difficulties with poor balance and changes in gait dating back several years which align with clinic notes reviewing MS symptom history. The patient also endorsed tremor. She has a family history of tremor in her mother.     Emotional and Behavioral Functioning:  The patient denied any current or past difficulties with symptoms of anxiety (frequent/difficult to control worry, trouble relaxing, feeling tense, recurring symptoms of panic, etc.), although indicated that she may be more easily  overwhelmed. The patient reported a history of recurrent episodes of depression.  She stated the first onset was approximately 9 to 12 years ago.  The patient reported that she is not currently experiencing significant symptoms of depression (e.g., low mood, anhedonia, significant decreases in activity).  She stated that her mood is currently "good," and she denied frequent feelings of low mood or decreased enjoyment from activities.  She indicated that she noticed improvements after they began weaning her off Effexor in March of this year. She continues to grieve the loss of her mother, with whom she was living. The patient denied any current suicidal ideation, but has had suicidal ideation in the past. She cited her family as a major protective factor in this respect.  The patient reported that she began to experience visual hallucinations starting approximately five to six years ago which she suspects are related to her medication. She reported that the hallucinations are purely visual, and involve somewhat unclear silhouettes of figures or (predominantly) animals approaching her. There was one exception to this involving two women standing on a hill. These occur exclusively in the context of dark environments, predominantly outdoors at night. She indicated she will carry a flashlight and shine it towards the figure to confirm that it is not real. She denied any history of trauma and the figures are not specific people or animals she has previously encountered. She stated that this last occurred around one year ago. She denied noticing any association between her mental state / mood and the hallucinations. The patient denied any current or past paranoia, symptoms of mania, or homicidal ideation.  Per medical records the patient is seen by psychiatry, establishing care in January 2025.  She met criteria for major depressive disorder and delayed sleep phase syndrome at the initial psychiatric evaluation.  Per a  08/22/2023 psychiatry note, the patient had  some improvement upon medication changes, noting increased activity levels and energy.  Psychiatry notes the patient's concern about the potential of dementia relating to her memory concerns.  Per psychiatry notes, "potential contributing factors include patient's depression, MS, and ongoing benzodiazepine use."  Sleep: The patient reported long running difficulties with sleep onset.  She indicated that she usually sleeps about 5 hours per her BiPAP report.  She indicated that she feels tired and fatigued most of the time.  She denied any RBD's. Appetite: No significant/unaccounted for changes. Caffeine: Minimal. Alcohol Use: Infrequent.  Denied problematic use history. Tobacco Use: 3 to 4 cigarettes/day for approximately 40 years.  Quit smoking around 1 year ago, supported by [employer]. Recreational Substance Use: Denied.  Level of Functional Independence: The patient is independent with basic activities of daily living.  Finances: Independent and without issue. Shopping / Meal Preparation: Independent and without issue (orders groceries online/has delivered).  Household Maintenance / Chores: Independent and without issue. Tracking Appointments / Future Obligations: Medication Management: Sister assists with organizing the weekly pillbox starting around a month ago.  The patient is able to remember to take her medications independently. Driving: 2 instances of struggling to navigate to 2 different doctors offices which she has traveled to multiple times in the past.  No other difficulties reported with respect to driving.  Medical History/Record Review: Patient was diagnosed with MS around 2015, but suspects that there were signs in 2008.  The patient endorsed chronic pain (back) which is a 9 out of 10 and present all the time.  She denied any history of seizures or cancer or TBI.  She indicated that she is working with providers on managing her cholesterol  and blood pressure.  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   Tobacco abuse 12/05/2022   Thrombocytopenia (HCC) 08/18/2020   Vitamin D deficiency 08/12/2020   Bronchitis, chronic (HCC) 05/28/2018   Benign essential tremor 11/09/2017   High risk medication use 11/09/2017   Multiple sclerosis (HCC) 04/10/2015   Right hip pain 04/10/2015   Degeneration of lumbar or lumbosacral intervertebral disc 04/10/2015   Gait abnormality 01/06/2015   Other fatigue 01/06/2015   Mixed incontinence 05/08/2014   Urge incontinence 03/28/2014   MDD (major depressive disorder), recurrent episode, severe (HCC) 01/13/2014   Diverticulitis of sigmoid colon 10/24/2013   DS (disseminated sclerosis) (HCC) 08/26/2013   Myelitis (HCC) 03/25/2013   DDD (degenerative disc disease), cervical 01/13/2011   Bone/cartilage disorder 01/13/2011   Genital herpes 01/13/2011   HLD (hyperlipidemia) 01/13/2011   OSA treated with BiPAP 01/13/2011   Degeneration of intervertebral disc of cervical region 01/13/2011   Dysthymia 01/13/2011    Imaging/Lab Results: Per medical record (Neurology, 08/21/23 visit) "MRI brain 05/14/2020 shows T2/FLAIR hyperintense foci in the hemispheres, pons and left basal ganglia.  Most of the foci are nonspecific.  A few are periventricular and radially oriented.  The focus in the left basal ganglia could also represent ischemic sequela.    None of the foci appear to be acute and they do  not enhance.    There are no new lesions compared with 05/12/2018 MRI.   MRI of the cervical spine shows 1 or 2 small subtle foci within the spinal cord adjacent to C4-C5 and C6.  These  have been seen on previous MRIs.  Though nonspecific, this is consistent with a chronic demyelinating plaque.  Minimal chronic compressive myelopathic signal cannot be ruled out.  There are no new lesions and no enhancing lesions.   Prior C4-C6 ACDF.  There is no nerve root compression or spinal stenosis at these levels.   Mild spinal stenosis and mild foraminal narrowing at C3-C4 and C6-C7 that does not lead to any nerve root compression.  Degenerative changes are stable compared to the 2020 MRI.   MRI of the brain 05/19/2021 showed no new lesions.  MRI of the cervical spine 05/19/2021 showed no new lesions.   MRI of the brain and cervical spine 11/2022 showed no new MS lesions.  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 There is moderate facet hypertrophy at L4-L5 with moderately severe facet hypertrophy at L5-S1."  Family Neurologic/Medical Hx:  Family History  Problem Relation Age of Onset   Bladder Cancer Mother    Colon cancer Mother    Colon cancer Father        diagnosed less than age of 27.    Bladder Cancer Maternal Grandmother    Leukemia Maternal Grandmother    Alzheimer's disease Neg Hx    Medications:  aspirin EC 81 MG tablet buPROPion (WELLBUTRIN XL) 300 MG 24 hr tablet busPIRone (BUSPAR) 30 MG tablet clonazePAM (KLONOPIN) 0.5 MG tablet cyclobenzaprine (FLEXERIL) 10 MG tablet ezetimibe (ZETIA) 10 MG tablet gabapentin (NEURONTIN) 300 MG capsule ipratropium (ATROVENT) 0.03 % nasal spray Melatonin 10 MG TABS metFORMIN (GLUCOPHAGE-XR) 500 MG 24 hr tablet MYRBETRIQ 50 MG TB24 tablet pantoprazole (PROTONIX) 40 MG tablet phentermine 37.5 MG capsule tirzepatide (MOUNJARO) 7.5 MG/0.5ML Pen traZODone (DESYREL) 50 MG tablet varenicline (CHANTIX) 1 MG tablet venlafaxine XR (EFFEXOR XR) 150 MG 24 hr capsule    Educational/Vocational History: Denied any significant learning difficulties growing up in school.  Denied any significant problems with attention and concentration.  Earned a bachelor's degree, estimated GPA was within the 3.0-4.0 range.  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  Worked for a cardiologist for about 25 years.  Stopped working in when on disability due to MS around 2015-2016.   Psychosocial: Marital Status: 1 prior marriage of 20 years, divorced. Children/Grandchildren: None. Living Situation: Lives alone, but her residence is connected to that of her sister and sister's husband. Daily Activities/Hobbies: The patient struggled to identify any activities she does for enjoyment.  Behavioral Observations:Behavioral Observations: The patient was seen on an outpatient basis in the [facility] office for the clinical interview unaccompanied. Orientation: The patient appeared oriented. Ability to Participate in Interview: The patient readily answered all questions seemingly to the best of her ability regarding personal history. Attitude: Cooperative. Attention: No frank attentional lapses were noted. Conversational Language: Conversational speech was prosodic, fluent, and well-articulated.  She appeared to have some difficulty with hearing but otherwise appeared intact with respect to receptive language functioning. Thought Process/Content: Linear, coherent, and goal directed. Motor Problems: Some tremor observed.  Ambulated independently. Affect/Mood: Congruent and  positive. Behavior/Interactions: Appropriate for setting.  SUMMARY / CLINICAL IMPRESSIONS The patient was referred for neuropsychological evaluation by her neurologist, Dr. Godwin Lat, due to concerns for memory loss. The patient's medical history, per records, includes MDD, relapsing remitting MS (diagnosed 2015), T2DM, OSA, essential tremor, and chronic pain. Per  records from her 08/21/23 neurology visit, the the patient had a worsening of short-term memory following the death of her mother in 04-23-2023. The patient scored a 20/30 on the MMSE. In his note, Dr. Godwin Lat states "We discussed that memory problems could be multifactorial. We know that she has several medical conditions that might play a role in reduced focus/attention and subsequently memory including anxiety/depression, multiple sclerosis and sleep apnea. Although statistically, Alzheimer's disease is rare at her age it can occur and this needs to be considered." The patient underwent lab work including ATN profile. Records indicated "A high pTau181 concentration was observed. A normal beta-amyloid 42/40 ratio and normal concentration of NfL were observed at this time. These results are not consistent with the presence of Alzheimer's-related pathology, but may indicate pathology of another condition." Per records from her most recent visit, the patient's "MS appears to be stable." Upon interview, the patient indicated she hoped to better understand the likely cause of her cognitive symptoms and what might be done to improve them.  The patient reported memory difficulties which have been present for at least 5 years (estimated) and more recent changes involving processing speed and attention/concentration.  She has a significant psychiatric history but reported no significant improvement cognitive symptoms alongside improvements in mood.  The patient is largely intact with instrumental activities of daily living.   DISPOSITION / PLAN  The patient has been set up for a formal neuropsychological assessment to objectively assess her cognitive functioning across domains to establish the patient's cognitive profile. This data, in conjunction with information obtained via clinical interview and medical record review, will help clarify likely etiology and guide treatment recommendations. Once data collection and  interpretation have been completed, the findings / diagnosis and recommendations will be reviewed and discussed with the patient during a feedback appointment with the neuropsychologist. Based on the collaborative dialogue with the patient during the feedback, recommendations may be adjusted / tailored as needed. A formal report will be produced and provided to the patient and the referring provider.   Diagnosis: Memory Loss    This report was generated using voice recognition software. While this document has been carefully reviewed, transcription errors may be present. I apologize in advance for any inconvenience. Please contact me if further clarification is needed.             Loletta Ripple, PsyD             Neuropsychologist

## 2023-09-04 DIAGNOSIS — R296 Repeated falls: Secondary | ICD-10-CM | POA: Diagnosis not present

## 2023-09-04 DIAGNOSIS — M5416 Radiculopathy, lumbar region: Secondary | ICD-10-CM | POA: Diagnosis not present

## 2023-09-04 DIAGNOSIS — M6281 Muscle weakness (generalized): Secondary | ICD-10-CM | POA: Diagnosis not present

## 2023-09-04 DIAGNOSIS — R2689 Other abnormalities of gait and mobility: Secondary | ICD-10-CM | POA: Diagnosis not present

## 2023-09-05 DIAGNOSIS — M6281 Muscle weakness (generalized): Secondary | ICD-10-CM | POA: Diagnosis not present

## 2023-09-05 DIAGNOSIS — R2689 Other abnormalities of gait and mobility: Secondary | ICD-10-CM | POA: Diagnosis not present

## 2023-09-05 DIAGNOSIS — M5416 Radiculopathy, lumbar region: Secondary | ICD-10-CM | POA: Diagnosis not present

## 2023-09-05 DIAGNOSIS — R296 Repeated falls: Secondary | ICD-10-CM | POA: Diagnosis not present

## 2023-09-07 DIAGNOSIS — R2689 Other abnormalities of gait and mobility: Secondary | ICD-10-CM | POA: Diagnosis not present

## 2023-09-07 DIAGNOSIS — R296 Repeated falls: Secondary | ICD-10-CM | POA: Diagnosis not present

## 2023-09-07 DIAGNOSIS — Z79899 Other long term (current) drug therapy: Secondary | ICD-10-CM | POA: Diagnosis not present

## 2023-09-12 DIAGNOSIS — R2689 Other abnormalities of gait and mobility: Secondary | ICD-10-CM | POA: Diagnosis not present

## 2023-09-12 DIAGNOSIS — M6281 Muscle weakness (generalized): Secondary | ICD-10-CM | POA: Diagnosis not present

## 2023-09-12 DIAGNOSIS — R296 Repeated falls: Secondary | ICD-10-CM | POA: Diagnosis not present

## 2023-09-12 DIAGNOSIS — M5416 Radiculopathy, lumbar region: Secondary | ICD-10-CM | POA: Diagnosis not present

## 2023-09-14 DIAGNOSIS — R296 Repeated falls: Secondary | ICD-10-CM | POA: Diagnosis not present

## 2023-09-14 DIAGNOSIS — R2689 Other abnormalities of gait and mobility: Secondary | ICD-10-CM | POA: Diagnosis not present

## 2023-09-14 DIAGNOSIS — M6281 Muscle weakness (generalized): Secondary | ICD-10-CM | POA: Diagnosis not present

## 2023-09-14 DIAGNOSIS — M5416 Radiculopathy, lumbar region: Secondary | ICD-10-CM | POA: Diagnosis not present

## 2023-09-18 NOTE — Progress Notes (Unsigned)
 BH MD/PA/NP OP Progress Note  09/18/2023 10:02 AM Helen Sandoval  MRN:  [id number]  Visit Diagnosis:  No diagnosis found.   Assessment: Helen Sandoval is a 63 y.o. female with a history of MDD, relapsing remitting multiple sclerosis (diagnosed 2015), T2DM, OSA, essential tremor, chronic low back pain, and Vitamin D  deficiency who presented to [facility] at [facility] for initial evaluation on 07/25/2023.    At initial evaluation patient reported symptoms of depression including low mood, anhedonia, amotivation, decreased tearfulness, feelings of hopelessness, and passive SI. She consistently denied active SI and there are no acute safety concerns at this time.  Crisis resources were reviewed.  Notably patient's mother who had been significant support in her life passed in November 2024.  While patient denied depressive symptoms worsening at that time she had endorsed otherwise at visit with another provider.  Symptoms progressed past what would be consistent with typical grief response.  Notably patient does have a diagnosis of MS was made in 2015 and it is around this time that her depressive symptoms first onset.  She has undergone numerous medication trials since with periods of improvement followed by loss of benefit from medication.  Patient met criteria for MDD and late sleep phase syndrome on initial evaluation.  [name] presents for follow-up evaluation. Today, 09/18/23, patient reports    continued improvement in mood following taper of venlafaxine  to 225 and increase in BuSpar  to 30 mg twice a day.  She has also decreased the Klonopin  to 0.5 mg at bedtime.  Patient is able to identify an improvement in energy and motivation.  She has engaged in some task around the house that she had been unable to do previously.  We will continue to taper venlafaxine  to 150 mg a day with plan to start the transition to alternative antidepressant next appointment.  We will also  start trazodone  50 mg for insomnia.  Patient will discuss with her neurologist if continuing Klonopin  long-term is necessary.  She also is undergoing screening for potential dementia as memory issues remain a concern.  Potential contributing factors include patient's depression, MS, and ongoing benzodiazepine use.  Psychotherapeutic interventions were used during today's session. From 4:05 PM to 4:27 PM we used empathic listening techniques and provided support. Used supportive interviewing techniques to validate patients feelings. Worked on cognitive re framing techniques and focusing on behavioral activation.  Improvement was evidenced by patient's participation.   Plan: Bubble Pack all medications (unable to do a current pharmacy patient considering switching to a new one) - Taper Effexor  to 75 mg daily for 7 days, then 37.5 mg for 7 days before discontinueing  - Duloxetine  30 mg daily and then increase to 60 mg daily in 7 days - Continue Buspar  30 mg BID - Continue Wellbutrin  XL 300 mg daily - Continue Gabapentin  300 mg BID prescribed by neuro - Increasing Trazodone  50 mg at bedtime - Recommend decreasing Klonopin  to 0.5 mg QHS prescribed by Neuro (per pt this is what she is currently taking) - Phentermine  37.5 mg daily managed by neurology - R/o contributing medical conditions: CBC, TSH, CMP grossly wnl 10/26/2022;   -Vitamin D  level repeated - Therapy referral  - Information for MS support groups provided - Sleep study completed today, already on BIPAP - Crisis resources reviewed - Follow up in a month   Chief Complaint:  No chief complaint on file.  HPI: Helen Sandoval presents alongside her sister who participated in the session with patient's  approvalToula Sandoval  Thinks that mentally things have ben going    Trazodone  50 mg less effective, had increased to 100 mg last couple days and reports that she is sleeping well   Question about belsomra or dayvigo for sleep. Can consisder if  needed once off klonopin . For now sleeping well on trazodone  100 mg   believes that she has done better this past month in terms of anxiety and depression.  She has noticed that she is no longer feeling as hopeless or worthless, and has seen evidence of improvement including her increased ability to complete tasks.  Patient states that she is not as bedbound as she had been previously and instead is up moving around taking care of some chores and has started cooking again.  She can be a bit negative with her self on things feeling that she has not made progress.  Patient was reminded to identify and reward herself for the achievements without minimizing things just because she perceives them as small.  Memory remains a concern and there are several factors that could be contributing to this.  Patient's depression, MS, benzodiazepine use, and potential dementia.  She is being screened for Alzheimer's markers and was referred for neuropsych evaluation.  In regards to benzodiazepine use patient's sister has decreased the dosing to half milligram in the evening.  This is a significant improvement from the 1 mg twice a day.  We did review the adverse side effects of long-term benzodiazepine use and suggested patient continue to discuss with her neurologist on the benefits of this medication.  1 potential use for patient reported is insomnia.  Discussed this and suggested a retrial of trazodone  as she has had good effect with this in the past.  We also discussed continuing to taper off venlafaxine  with a plan to start an alternative antidepressant in the future.  Patient has tolerated the initial decrease well without adverse side effects.  We will plan to start cross taper to a new medication at next appointment.  In regards to bubble packing medication patient's pharmacy was unable to do so.  She will consider switching to an alternative pharmacy that might be better able to do this.  She also looking into getting  a daily pill dispenser.  Past Psychiatric History: Diagnoses: MDD Medication trials: Zoloft  (lost effectiveness); Prozac (ineffective); Cymbalta  (appears to have been brief trial); Trazodone  (loss of effect over time), Viibryd (couldn't get insurance to cover); Xanax ; Adderall (inattention, lost effect over time) Previous psychiatrist/therapist: Dr. Eligio Grumbling for an initial eval in January of 2025 Hospitalizations: denies Suicide attempts: denies SIB: denies Hx of violence towards others: denies   Substance use:  -- Etoh: 1 glass wine every few months  -- Tobacco: quit 11/20/22; on Chantix   -- Cannabis: denies  -- Illicit drugs: denies  -- Caffeine: 1 soda daily; denies use of coffee, tea, energy drinks  Past Medical History:  Past Medical History:  Diagnosis Date   Ankle fracture    Depression    Diabetes (HCC)    Fatty liver    Osteopenia    Osteoporosis    Scarlet fever    Shingles    Sleep apnea    Spina bifida    Splenomegaly    Thrombocytopenia (HCC)    Vitamin D  deficiency     Past Surgical History:  Procedure Laterality Date   ABDOMINAL HYSTERECTOMY     fibroids and heavy bleeding   BREAST BIOPSY Right    20+ yrs ago  CERVICAL FUSION     Family History:  Family History  Problem Relation Age of Onset   Bladder Cancer Mother    Colon cancer Mother    Colon cancer Father        diagnosed less than age of 72.    Bladder Cancer Maternal Grandmother    Leukemia Maternal Grandmother    Alzheimer's disease Neg Hx     Social History:  Social History   Socioeconomic History   Marital status: Divorced    Spouse name: Not on file   Number of children: Not on file   Years of education: Not on file   Highest education level: Not on file  Occupational History   Not on file  Tobacco Use   Smoking status: Former    Current packs/day: 0.00    Types: Cigarettes    Quit date: 11/21/2022    Years since quitting: 0.8   Smokeless tobacco: Never   Tobacco  comments:    1 cigarrettes per day. 09/28/2022 [facility]  Substance and Sexual Activity   Alcohol use: Yes    Comment: occasional use. a few times a year.    Drug use: No   Sexual activity: Not on file  Other Topics Concern   Not on file  Social History Narrative   Pt lives with sister    Disability    Social Drivers of Health   Financial Resource Strain: Not on file  Food Insecurity: Not on file  Transportation Needs: Not on file  Physical Activity: Not on file  Stress: Not on file  Social Connections: Not on file    Allergies:  Allergies  Allergen Reactions   Clindamycin Anaphylaxis   Indomethacin Hives   Levofloxacin Hives   Penicillins Anaphylaxis   Penicillins Cross Reactors Anaphylaxis   Sulfa Antibiotics Swelling    Blister (steven john syndrome)   Levaquin [Levofloxacin Hemihydrate] Hives   Lipitor  [Atorvastatin Calcium]    Multihance  [Gadobenate] Hives    Pt having hives/ rash on neck and chest    Sulfamethoxazole-Trimethoprim    Zocor [Simvastatin] Other (See Comments)    Other reaction(s): MUSCLE PAIN Muscle aches    Current Medications: Current Outpatient Medications  Medication Sig Dispense Refill   albuterol  (PROVENTIL ) (2.5 MG/3ML) 0.083% nebulizer solution Take 3 mLs (2.5 mg total) by nebulization every 6 (six) hours as needed for wheezing or shortness of breath. 75 mL 12   albuterol  (VENTOLIN  HFA) 108 (90 Base) MCG/ACT inhaler Inhale 2 puffs into the lungs every 6 (six) hours as needed for wheezing or shortness of breath. 8 g 6   aspirin EC 81 MG tablet Take 81 mg by mouth every other day.     buPROPion  (WELLBUTRIN  XL) 300 MG 24 hr tablet Take 1 tablet (300 mg total) by mouth in the morning. 30 tablet 2   busPIRone  (BUSPAR ) 30 MG tablet Take 1 tablet (30 mg total) by mouth 2 (two) times daily. 60 tablet 2   Cladribine (MAVENCLAD, 9 TABS, PO) Take by mouth.     clonazePAM  (KLONOPIN ) 1 MG tablet Take 1 tablet (1 mg total) by mouth 2 (two) times daily. 60  tablet 5   cyclobenzaprine  (FLEXERIL ) 10 MG tablet Take 1 tablet (10 mg total) by mouth at bedtime. (Patient taking differently: Take 10 mg by mouth 3 (three) times daily as needed.) 90 tablet 3   ezetimibe (ZETIA) 10 MG tablet Take 10 mg by mouth daily.      fluticasone-salmeterol (WIXELA INHUB ) 100-50  MCG/ACT AEPB Inhale 1 puff into the lungs 2 (two) times daily. 60 each 12   gabapentin  (NEURONTIN ) 300 MG capsule TAKE 1 CAPSULE BY MOUTH THREE TIMES DAILY 270 capsule 0   ipratropium (ATROVENT) 0.03 % nasal spray Place 2 sprays into both nostrils daily at 2 PM.     Melatonin 10 MG TABS Take 10 mg by mouth daily.     metFORMIN (GLUCOPHAGE-XR) 500 MG 24 hr tablet Take 2 tablets by mouth 2 (two) times daily with a meal. 1 tablet in morning and 2 tablets at night     MYRBETRIQ  50 MG TB24 tablet Take 1 tablet by mouth once daily 90 tablet 0   pantoprazole (PROTONIX) 40 MG tablet Take 40 mg by mouth every morning.     phentermine  37.5 MG capsule Take 1 capsule (37.5 mg total) by mouth every morning. 90 capsule 1   Spacer/Aero-Holding Chambers (EQ SPACE CHAMBER ANTI-STATIC) DEVI USE AS DIRECTED WITH  INHALERS 1 each 0   tirzepatide (MOUNJARO) 7.5 MG/0.5ML Pen Inject 7.5 mg into the skin once a week.     traZODone  (DESYREL ) 50 MG tablet Take 1 tablet (50 mg total) by mouth at bedtime. 30 tablet 1   varenicline  (CHANTIX ) 1 MG tablet Take 1 tablet by mouth twice daily 60 tablet 1   venlafaxine  XR (EFFEXOR  XR) 150 MG 24 hr capsule Take 1 capsule (150 mg total) by mouth daily. 30 capsule 2   zinc gluconate 50 MG tablet Take 50 mg by mouth daily.     No current facility-administered medications for this visit.     Musculoskeletal: Strength & Muscle Tone: within normal limits Gait & Station: normal Patient leans: N/A  Psychiatric Specialty Exam: Review of Systems  There were no vitals taken for this visit.There is no height or weight on file to calculate BMI.  General Appearance: Fairly Groomed  Eye  Contact:  Good  Speech:  Clear and Coherent  Volume:  Normal  Mood:  Euthymic  Affect:  Appropriate  Thought Process:  Coherent  Orientation:  Full (Time, Place, and Person)  Thought Content: Logical   Suicidal Thoughts:  No  Homicidal Thoughts:  No  Memory:  Immediate;   Fair  Judgement:  Good  Insight:  Fair  Psychomotor Activity:  Normal  Concentration:  Concentration: Fair  Recall:  Fair  Fund of Knowledge: Good  Language: Good  Akathisia:  NA    AIMS (if indicated): not done  Assets:  Communication Skills Desire for Improvement Financial Resources/Insurance Housing Transportation  ADL's:  Intact  Cognition: WNL  Sleep:  Fair   Metabolic Disorder Labs: No results found for: "HGBA1C", "MPG" No results found for: "PROLACTIN" No results found for: "CHOL", "TRIG", "HDL", "CHOLHDL", "VLDL", "LDLCALC" Lab Results  Component Value Date   TSH 5.340 (H) 08/21/2023   TSH 3.080 10/26/2022    Therapeutic Level Labs: No results found for: "LITHIUM" No results found for: "VALPROATE" No results found for: "CBMZ"   Screenings: Mini-Mental    Flowsheet Row Office Visit from 08/21/2023 in [facility]  Total Score (max 30 points ) 20       Collaboration of Care: Collaboration of Care: Medication Management AEB medication prescription and Other provider involved in patient's care AEB neurology and neuropsych chart review  Patient/Guardian was advised Release of Information must be obtained prior to any record release in order to collaborate their care with an outside provider. Patient/Guardian was advised if they have not already done  so to contact the registration department to sign all necessary forms in order for us  to release information regarding their care.   Consent: Patient/Guardian gives verbal consent for treatment and assignment of benefits for services provided during this visit. Patient/Guardian expressed understanding and agreed  to proceed.    Yves Herb, MD 09/18/2023, 10:02 AM

## 2023-09-19 ENCOUNTER — Ambulatory Visit (HOSPITAL_COMMUNITY): Admitting: Psychiatry

## 2023-09-19 VITALS — BP 134/91 | HR 100 | Ht 64.0 in | Wt 196.0 lb

## 2023-09-19 DIAGNOSIS — F332 Major depressive disorder, recurrent severe without psychotic features: Secondary | ICD-10-CM | POA: Diagnosis not present

## 2023-09-19 DIAGNOSIS — G4721 Circadian rhythm sleep disorder, delayed sleep phase type: Secondary | ICD-10-CM

## 2023-09-19 MED ORDER — VENLAFAXINE HCL ER 37.5 MG PO CP24: ORAL_CAPSULE | ORAL | 0 refills | Status: DC

## 2023-09-19 MED ORDER — DULOXETINE HCL 30 MG PO CPEP: ORAL_CAPSULE | ORAL | 1 refills | Status: DC

## 2023-09-19 MED ORDER — TRAZODONE HCL 100 MG PO TABS: 100.0000 mg | ORAL_TABLET | Freq: Every day | ORAL | 2 refills | Status: DC

## 2023-09-20 ENCOUNTER — Encounter (HOSPITAL_COMMUNITY): Payer: Self-pay | Admitting: Psychiatry

## 2023-09-27 DIAGNOSIS — R2689 Other abnormalities of gait and mobility: Secondary | ICD-10-CM | POA: Diagnosis not present

## 2023-09-27 DIAGNOSIS — M6281 Muscle weakness (generalized): Secondary | ICD-10-CM | POA: Diagnosis not present

## 2023-09-27 DIAGNOSIS — M5416 Radiculopathy, lumbar region: Secondary | ICD-10-CM | POA: Diagnosis not present

## 2023-09-27 DIAGNOSIS — R296 Repeated falls: Secondary | ICD-10-CM | POA: Diagnosis not present

## 2023-10-02 ENCOUNTER — Ambulatory Visit: Payer: Medicare HMO | Admitting: Nurse Practitioner

## 2023-10-02 DIAGNOSIS — M5416 Radiculopathy, lumbar region: Secondary | ICD-10-CM | POA: Diagnosis not present

## 2023-10-02 DIAGNOSIS — R2689 Other abnormalities of gait and mobility: Secondary | ICD-10-CM | POA: Diagnosis not present

## 2023-10-02 DIAGNOSIS — R296 Repeated falls: Secondary | ICD-10-CM | POA: Diagnosis not present

## 2023-10-02 DIAGNOSIS — M6281 Muscle weakness (generalized): Secondary | ICD-10-CM | POA: Diagnosis not present

## 2023-10-04 ENCOUNTER — Ambulatory Visit: Payer: Medicare HMO | Admitting: Nurse Practitioner

## 2023-10-05 DIAGNOSIS — M6281 Muscle weakness (generalized): Secondary | ICD-10-CM | POA: Diagnosis not present

## 2023-10-05 DIAGNOSIS — M5416 Radiculopathy, lumbar region: Secondary | ICD-10-CM | POA: Diagnosis not present

## 2023-10-05 DIAGNOSIS — R2689 Other abnormalities of gait and mobility: Secondary | ICD-10-CM | POA: Diagnosis not present

## 2023-10-05 DIAGNOSIS — R296 Repeated falls: Secondary | ICD-10-CM | POA: Diagnosis not present

## 2023-10-09 ENCOUNTER — Other Ambulatory Visit: Payer: Self-pay | Admitting: Nurse Practitioner

## 2023-10-09 DIAGNOSIS — R2689 Other abnormalities of gait and mobility: Secondary | ICD-10-CM | POA: Diagnosis not present

## 2023-10-09 DIAGNOSIS — M6281 Muscle weakness (generalized): Secondary | ICD-10-CM | POA: Diagnosis not present

## 2023-10-09 DIAGNOSIS — M5416 Radiculopathy, lumbar region: Secondary | ICD-10-CM | POA: Diagnosis not present

## 2023-10-09 DIAGNOSIS — R296 Repeated falls: Secondary | ICD-10-CM | POA: Diagnosis not present

## 2023-10-11 ENCOUNTER — Encounter: Payer: Self-pay | Admitting: Nurse Practitioner

## 2023-10-11 ENCOUNTER — Ambulatory Visit: Admitting: Nurse Practitioner

## 2023-10-11 ENCOUNTER — Ambulatory Visit (INDEPENDENT_AMBULATORY_CARE_PROVIDER_SITE_OTHER)

## 2023-10-11 VITALS — BP 110/80 | HR 119 | Ht 64.0 in | Wt 196.6 lb

## 2023-10-11 DIAGNOSIS — J209 Acute bronchitis, unspecified: Secondary | ICD-10-CM | POA: Diagnosis not present

## 2023-10-11 DIAGNOSIS — J440 Chronic obstructive pulmonary disease with acute lower respiratory infection: Secondary | ICD-10-CM | POA: Diagnosis not present

## 2023-10-11 DIAGNOSIS — J069 Acute upper respiratory infection, unspecified: Secondary | ICD-10-CM | POA: Insufficient documentation

## 2023-10-11 DIAGNOSIS — F17210 Nicotine dependence, cigarettes, uncomplicated: Secondary | ICD-10-CM

## 2023-10-11 DIAGNOSIS — Z720 Tobacco use: Secondary | ICD-10-CM | POA: Diagnosis not present

## 2023-10-11 DIAGNOSIS — R053 Chronic cough: Secondary | ICD-10-CM | POA: Diagnosis not present

## 2023-10-11 MED ORDER — AZITHROMYCIN 250 MG PO TABS: ORAL_TABLET | ORAL | 0 refills | Status: DC

## 2023-10-11 MED ORDER — PREDNISONE 10 MG PO TABS: ORAL_TABLET | ORAL | 0 refills | Status: DC

## 2023-10-11 NOTE — Patient Instructions (Addendum)
 Continue Albuterol  inhaler 2 puffs or 3 mL neb every 6 hours as needed for shortness of breath or wheezing. Notify if symptoms persist despite rescue inhaler/neb use. Continue Breztri  2 puffs Twice daily. Brush tongue and rinse mouth afterwards. Let me know if this is covered by your new plan Continue mucinex as needed for cough/congestin Continue cough medicine as prescribed  Prednisone  taper. 4 tabs for 2 days, then 3 tabs for 2 days, 2 tabs for 2 days, then 1 tab for 2 days, then stop. Take in AM with food.  Azithromycin  - take 2 tablets on day one then 1 tablet daily for four additional days. Take with food  Chest x ray today    Attend lung cancer screening CT as scheduled   Decrease Chantix  to once a day for 1-2 weeks then if doing well, start taking every other day for 2 weeks then stop if you feel like you aren't having any issues with cravings.  Great job staying smoke free!!    Follow up in 4 months with new pulmonologist or Katie Analy Bassford,NP. If symptoms worsen, please contact office for sooner follow up or seek emergency care.

## 2023-10-11 NOTE — Assessment & Plan Note (Signed)
 Acute bronchitis likely secondary to viral illness. Given failure to improve despite supportive care measures, will start her on empiric azithromycin  and treat her with prednisone  taper. Continue current bronchodilator regimen and cough control regimen. Target postnasal drainage to minimize upper airway irritation that's likely contributing to cough. CXR today to rule out superimposed infection. Action plan in place. Aware to call if she fails to improve or worsens.   Patient Instructions  Continue Albuterol  inhaler 2 puffs or 3 mL neb every 6 hours as needed for shortness of breath or wheezing. Notify if symptoms persist despite rescue inhaler/neb use. Continue Breztri  2 puffs Twice daily. Brush tongue and rinse mouth afterwards. Let me know if this is covered by your new plan Continue mucinex as needed for cough/congestin Continue cough medicine as prescribed  Prednisone  taper. 4 tabs for 2 days, then 3 tabs for 2 days, 2 tabs for 2 days, then 1 tab for 2 days, then stop. Take in AM with food.  Azithromycin  - take 2 tablets on day one then 1 tablet daily for four additional days. Take with food  Chest x ray today    Attend lung cancer screening CT as scheduled   Decrease Chantix  to once a day for 1-2 weeks then if doing well, start taking every other day for 2 weeks then stop if you feel like you aren't having any issues with cravings.  Great job staying smoke free!!    Follow up in 4 months with new pulmonologist or Katie Keiaira Donlan,NP. If symptoms worsen, please contact office for sooner follow up or seek emergency care.

## 2023-10-11 NOTE — Assessment & Plan Note (Signed)
 Former smoker. She has been smoke free for approx. 1 year now using Chantix . She will start to taper off and monitor cravings. Counseled to remain smoke free. Continue lung cancer screening CT with lung cancer program.

## 2023-10-11 NOTE — Progress Notes (Signed)
 w  @Patient  ID: Helen Sandoval, female    DOB: Nov 25, 1960, 63 y.o.   MRN: [id number]  Chief Complaint  Patient presents with   Follow-up    Patient states she's been coughing due to exposure to rsv.    Referring provider: Virgle Grime, MD  HPI: 63 year old female, former smoker followed for emphysema and chronic bronchitis.  She is a former patient of Dr. Glenis Langdon and last seen in office 04/03/2023 by Cleo Dace NP.  Past medical history significant for OSA on BiPAP, multiple sclerosis, anxiety and depression, HLD  TEST/EVENTS:  09/28/2022 PFT: FVC 98, FEV1 97, ratio 77, TLC 110, DLCOcor 109.  No BD.  Normal PFTs 10/22/2022 LDCT chest: Atherosclerosis.  No LAD.  No suspicious nodules or masses.  Mild diffuse bronchial wall thickening with very mild centrilobular and paraseptal emphysema.  Evidence of hepatic steatosis.  09/28/2022: OV with Dr. Thelda Finney.  Initially seen for chest tightness and shortness of breath with ongoing wheezing.  Chronic sputum production.  Multiple bouts of recurrent bronchitis.  Started on Breztri .  Symptoms have improved.  Feeling much better.  No longer having chest tightness cough or wheezing.  Pulmonary function testing revealed a reduced ERV, normal ratio, normal spirometry and mildly elevated DLCO.  No significant bronchodilator response.  Never been told that she has asthma but does have some allergy symptoms.  Question intermittent asthma symptoms.  Will continue her on Breztri .  Referred to lung cancer screening program.  Counseled on smoking cessation and started Chantix .   12/05/2022: Ov with Kaelum Kissick NP for follow-up.  She has been doing pretty well since she was here last.  Has been having just a little bit more trouble with chest tightness and wheezing now that it is very hot and humid outside.  As long as she is inside, she tends to do just fine.  Feels like she still gets benefit from the Breztri .  She did have a low-dose lung cancer screening CT without any concerning  nodules/masses.  She did have some mild emphysema.  Denies any cough, chest congestion, lower extremity swelling, orthopnea.  Has not had to use her rescue.  She quit smoking 2 weeks ago.  Feels like the Chantix  has helped a lot.  Not currently having any cravings.  04/03/2023: OV with Lashonta Pilling NP for follow-up.  She has been doing very well since she was here last.  She was using Breztri  on a consistent basis, which she found to be helpful; unfortunately her co-pay on this after insurance is very expensive so she stopped using it on a daily basis.  Breathing and cough have been doing okay without this.  She feels like since she quit smoking things been going better for her.  She denies any wheezing, chest congestion.  Still uses her Breztri  occasionally when she feels like she is more short winded or has some chest tightness.  It does help when she does this.  She is still on Chantix  daily, which she thinks has made a huge difference in her smoking journey.   10/11/2023: Today - acute Discussed the use of AI scribe software for clinical note transcription with the patient, who gave verbal consent to proceed.  History of Present Illness   Helen Sandoval "Helen" is a 63 year old female with multiple sclerosis who presents with a persistent cough following RSV exposure.  She was exposed to RSV on Mother's Day, approximately a week and a half ago, and began experiencing symptoms such as a sore throat  and body aches shortly thereafter. These symptoms have mostly improved but she's had a persistent cough that seems to be getting worse. Her cough is described as deep and burning, similar to when she had COVID. No wheezing, fever, or hemoptysis. She is currently using Mucinex and a prescription cough syrup to manage her symptoms, which is helping. She also uses Atrovent nasal spray. She is still having postnasal drainage. Doesn't feel like her sinuses are congested and no headaches or facial tenderness.  She has a  history of multiple sclerosis, which has been exacerbated recently, causing back pain and general discomfort.   She has a history of bronchitis and pneumonia, having had pneumonia three times in the past, but has received the pneumonia vaccine. She is using Breztri  inhaler samples and plans to switch to Wixela once she is finished as Breztri  wasn't covered on prior insurance plan. She has not checked with her new insurance plan since switching to Medicare. She is not currently using albuterol .  She quit smoking a year ago and has been using Chantix , currently taking 1 mg twice a day. She is committed to maintaining her smoking cessation.       Allergies  Allergen Reactions   Clindamycin Anaphylaxis   Indomethacin Hives   Levofloxacin Hives   Penicillins Anaphylaxis   Penicillins Cross Reactors Anaphylaxis   Sulfa Antibiotics Swelling    Blister (steven john syndrome)   Levaquin [Levofloxacin Hemihydrate] Hives   Lipitor  [Atorvastatin Calcium]    Multihance  [Gadobenate] Hives    Pt having hives/ rash on neck and chest    Sulfamethoxazole-Trimethoprim    Zocor [Simvastatin] Other (See Comments)    Other reaction(s): MUSCLE PAIN Muscle aches    Immunization History  Administered Date(s) Administered   Influenza, Seasonal, Injecte, Preservative Fre 04/01/2009   Influenza,inj,Quad PF,6+ Mos 01/25/2016   Influenza,inj,quad, With Preservative 01/23/2017   Influenza,trivalent, recombinat, inj, PF 02/02/2023   Influenza-Unspecified 01/30/2013, 02/20/2014, 06/26/2017, 01/21/2018, 02/14/2018, 03/16/2021   MMR 09/15/2009   PFIZER(Purple Top)SARS-COV-2 Vaccination 07/06/2019, 07/28/2019, 01/14/2020   PPD Test 09/15/2010   Pfizer(Comirnaty)Fall Seasonal Vaccine 12 years and older 02/02/2023   Pneumococcal Polysaccharide-23 10/29/2014   Tdap 09/07/2009   Zoster Recombinant(Shingrix) 02/27/2019    Past Medical History:  Diagnosis Date   Ankle fracture    Depression    Diabetes (HCC)     Fatty liver    Osteopenia    Osteoporosis    Scarlet fever    Shingles    Sleep apnea    Spina bifida    Splenomegaly    Thrombocytopenia (HCC)    Vitamin D  deficiency     Tobacco History: Social History   Tobacco Use  Smoking Status Former   Current packs/day: 0.00   Types: Cigarettes   Quit date: 11/21/2022   Years since quitting: 0.8  Smokeless Tobacco Never  Tobacco Comments   1 cigarrettes per day. 09/28/2022 Tay   Counseling given: Not Answered Tobacco comments: 1 cigarrettes per day. 09/28/2022 Tay   Outpatient Medications Prior to Visit  Medication Sig Dispense Refill   albuterol  (VENTOLIN  HFA) 108 (90 Base) MCG/ACT inhaler Inhale 2 puffs into the lungs every 6 (six) hours as needed for wheezing or shortness of breath. 8 g 6   aspirin EC 81 MG tablet Take 81 mg by mouth every other day.     buPROPion  (WELLBUTRIN  XL) 300 MG 24 hr tablet Take 1 tablet (300 mg total) by mouth in the morning. 30 tablet 2   Cladribine (MAVENCLAD,  9 TABS, PO) Take by mouth.     cyclobenzaprine  (FLEXERIL ) 10 MG tablet Take 1 tablet (10 mg total) by mouth at bedtime. (Patient taking differently: Take 10 mg by mouth 3 (three) times daily as needed.) 90 tablet 3   DULoxetine  (CYMBALTA ) 30 MG capsule Take 1 capsule (30 mg total) by mouth daily for 7 days, THEN 2 capsules (60 mg total) daily for 23 days. 53 capsule 1   ezetimibe (ZETIA) 10 MG tablet Take 10 mg by mouth daily.      fluticasone-salmeterol (WIXELA INHUB ) 100-50 MCG/ACT AEPB Inhale 1 puff into the lungs 2 (two) times daily. 60 each 12   gabapentin  (NEURONTIN ) 300 MG capsule TAKE 1 CAPSULE BY MOUTH THREE TIMES DAILY 270 capsule 0   ipratropium (ATROVENT) 0.03 % nasal spray Place 2 sprays into both nostrils daily at 2 PM.     Melatonin 10 MG TABS Take 10 mg by mouth daily.     metFORMIN (GLUCOPHAGE-XR) 500 MG 24 hr tablet Take 2 tablets by mouth 2 (two) times daily with a meal. 1 tablet in morning and 2 tablets at night     MYRBETRIQ  50  MG TB24 tablet Take 1 tablet by mouth once daily 90 tablet 0   pantoprazole (PROTONIX) 40 MG tablet Take 40 mg by mouth every morning.     phentermine  37.5 MG capsule Take 1 capsule (37.5 mg total) by mouth every morning. 90 capsule 1   Spacer/Aero-Holding Chambers (EQ SPACE CHAMBER ANTI-STATIC) DEVI USE AS DIRECTED WITH  INHALERS 1 each 0   traZODone  (DESYREL ) 100 MG tablet Take 1 tablet (100 mg total) by mouth at bedtime. 30 tablet 2   varenicline  (CHANTIX ) 1 MG tablet Take 1 tablet by mouth twice daily 60 tablet 0   albuterol  (PROVENTIL ) (2.5 MG/3ML) 0.083% nebulizer solution Take 3 mLs (2.5 mg total) by nebulization every 6 (six) hours as needed for wheezing or shortness of breath. 75 mL 12   clonazePAM  (KLONOPIN ) 1 MG tablet Take 1 tablet (1 mg total) by mouth 2 (two) times daily. 60 tablet 5   tirzepatide (MOUNJARO) 7.5 MG/0.5ML Pen Inject 7.5 mg into the skin once a week.     venlafaxine  XR (EFFEXOR  XR) 37.5 MG 24 hr capsule Take 2 capsules (75 mg total) by mouth daily for 7 days, THEN 1 capsule (37.5 mg total) daily for 7 days. 21 capsule 0   zinc gluconate 50 MG tablet Take 50 mg by mouth daily.     No facility-administered medications prior to visit.     Review of Systems:   Constitutional: No weight loss or gain, night sweats, fevers, chills, or lassitude. +fatigue  HEENT: No headaches, difficulty swallowing, tooth/dental problems, or sore throat. No sneezing, itching, ear ache, nasal congestion,  +post nasal drip CV:  No chest pain, orthopnea, PND, swelling in lower extremities, anasarca, dizziness, palpitations, syncope Resp: +shortness of breath with exertion; chest tightness; non productive cough. No excess mucus or change in color of mucus. No wheeze. No hemoptysis. No chest wall deformity GI:  No heartburn, indigestion MSK:  +myalgias/arthralgias  Neuro: No dizziness or lightheadedness.  Psych: No depression or anxiety. Mood stable.     Physical Exam:  BP 110/80 (BP  Location: Left Arm, Patient Position: Standing;Sitting)   Pulse (!) 119   Ht 5\' 4"  (1.626 m)   Wt 196 lb 9.6 oz (89.2 kg)   SpO2 96%   BMI 33.75 kg/m   GEN: Pleasant, interactive, well-appearing; obese; in no acute  distress HEENT:  Normocephalic and atraumatic. PERRLA. Sclera white. Nasal turbinates erythematous, moist and patent bilaterally. No rhinorrhea present. Oropharynx pink and moist, without exudate or edema. No lesions, ulcerations, or postnasal drip.  NECK:  Supple w/ fair ROM. No JVD present. Normal carotid impulses w/o bruits. Thyroid  symmetrical with no goiter or nodules palpated. No lymphadenopathy.   CV: RRR, no m/r/g, no peripheral edema. Pulses intact, +2 bilaterally. No cyanosis, pallor or clubbing. PULMONARY:  Unlabored, regular breathing. Clear bilaterally A&P w/o wheezes/rales/rhonchi. Bronchitic cough. No accessory muscle use.  GI: BS present and normoactive. Soft, non-tender to palpation. No organomegaly or masses detected.  MSK: No erythema, warmth or tenderness. Cap refil <2 sec all extrem. No deformities or joint swelling noted.  Neuro: A/Ox3. No focal deficits noted.   Skin: Warm, no lesions or rashe Psych: Normal affect and behavior. Judgement and thought content appropriate.     Lab Results:  CBC    Component Value Date/Time   WBC 5.1 10/26/2022 1523   WBC 5.8 09/28/2022 1539   RBC 4.11 10/26/2022 1523   RBC 3.93 09/28/2022 1539   HGB 13.0 10/26/2022 1523   HCT 38.6 10/26/2022 1523   PLT 129 (L) 10/26/2022 1523   MCV 94 10/26/2022 1523   MCH 31.6 10/26/2022 1523   MCH 32.2 08/31/2021 1453   MCHC 33.7 10/26/2022 1523   MCHC 33.8 09/28/2022 1539   RDW 13.6 10/26/2022 1523   LYMPHSABS 0.7 10/26/2022 1523   MONOABS 0.3 09/28/2022 1539   EOSABS 0.1 10/26/2022 1523   BASOSABS 0.0 10/26/2022 1523    BMET    Component Value Date/Time   NA 141 10/26/2022 1523   K 4.1 10/26/2022 1523   CL 102 10/26/2022 1523   CO2 26 10/26/2022 1523   GLUCOSE 98  10/26/2022 1523   GLUCOSE 154 (H) 03/02/2021 1318   BUN 19 10/26/2022 1523   CREATININE 0.92 10/26/2022 1523   CREATININE 1.02 (H) 03/02/2021 1318   CALCIUM 9.3 10/26/2022 1523   GFRNONAA >60 03/02/2021 1318   GFRAA 75 02/27/2020 1316    BNP No results found for: "BNP"   Imaging:  DG Chest 2 View Result Date: 10/11/2023 EXAM: 2 VIEW(S) XRAY OF THE CHEST 10/11/2023 02:45:03 PM COMPARISON: CT chest lung cancer screening 10/22/2022. CLINICAL HISTORY: Persistent cough. FINDINGS: LUNGS AND PLEURA: No focal pulmonary opacity. No pulmonary edema. No pleural effusion. No pneumothorax. HEART AND MEDIASTINUM: No acute abnormality of the cardiac and mediastinal silhouettes. BONES AND SOFT TISSUES: No acute osseous abnormality. IMPRESSION: 1. No acute process. Electronically signed by: Audree Leas MD 10/11/2023 04:11 PM EDT RP Workstation: [id number]    Administration History     None          Latest Ref Rng & Units 09/28/2022    1:44 PM  PFT Results  FVC-Pre L 3.13   FVC-Predicted Pre % 98   FVC-Post L 3.10   FVC-Predicted Post % 97   Pre FEV1/FVC % % 78   Post FEV1/FCV % % 77   FEV1-Pre L 2.44   FEV1-Predicted Pre % 99   FEV1-Post L 2.37   DLCO uncorrected ml/min/mmHg 21.88   DLCO UNC% % 109   DLCO corrected ml/min/mmHg 21.88   DLCO COR %Predicted % 109   DLVA Predicted % 106   TLC L 5.56   TLC % Predicted % 110   RV % Predicted % 84     No results found for: "NITRICOXIDE"      Assessment & Plan:  Acute bronchitis with COPD (HCC) Acute bronchitis likely secondary to viral illness. Given failure to improve despite supportive care measures, will start her on empiric azithromycin  and treat her with prednisone  taper. Continue current bronchodilator regimen and cough control regimen. Target postnasal drainage to minimize upper airway irritation that's likely contributing to cough. CXR today to rule out superimposed infection. Action plan in place. Aware to call if  she fails to improve or worsens.   Patient Instructions  Continue Albuterol  inhaler 2 puffs or 3 mL neb every 6 hours as needed for shortness of breath or wheezing. Notify if symptoms persist despite rescue inhaler/neb use. Continue Breztri  2 puffs Twice daily. Brush tongue and rinse mouth afterwards. Let me know if this is covered by your new plan Continue mucinex as needed for cough/congestin Continue cough medicine as prescribed  Prednisone  taper. 4 tabs for 2 days, then 3 tabs for 2 days, 2 tabs for 2 days, then 1 tab for 2 days, then stop. Take in AM with food.  Azithromycin  - take 2 tablets on day one then 1 tablet daily for four additional days. Take with food  Chest x ray today    Attend lung cancer screening CT as scheduled   Decrease Chantix  to once a day for 1-2 weeks then if doing well, start taking every other day for 2 weeks then stop if you feel like you aren't having any issues with cravings.  Great job staying smoke free!!    Follow up in 4 months with new pulmonologist or Katie Janssen Zee,NP. If symptoms worsen, please contact office for sooner follow up or seek emergency care.    URI (upper respiratory infection) Known sick exposure to RSV. Continue supportive care. See above.   Tobacco abuse Former smoker. She has been smoke free for approx. 1 year now using Chantix . She will start to taper off and monitor cravings. Counseled to remain smoke free. Continue lung cancer screening CT with lung cancer program.     I spent 35 minutes of dedicated to the care of this patient on the date of this encounter to include pre-visit review of records, face-to-face time with the patient discussing conditions above, post visit ordering of testing, clinical documentation with the electronic health record, making appropriate referrals as documented, and communicating necessary findings to members of the patients care team.  Roetta Clarke, NP 10/11/2023  Pt aware and understands NP's  role.

## 2023-10-11 NOTE — Assessment & Plan Note (Signed)
 Known sick exposure to RSV. Continue supportive care. See above.

## 2023-10-12 ENCOUNTER — Ambulatory Visit: Payer: Self-pay | Admitting: Nurse Practitioner

## 2023-10-12 ENCOUNTER — Encounter: Attending: Psychology

## 2023-10-12 DIAGNOSIS — R413 Other amnesia: Secondary | ICD-10-CM | POA: Insufficient documentation

## 2023-10-12 NOTE — Progress Notes (Signed)
 Behavioral Observations:  The patient was oriented to self, place, and time. Her hearing and vision were adequate for testing. She ambulated independently and without issue. No hand tremor was noted during testing. Her speech was prosodic, fluent, and well-articulated. She displayed no clear indications of word-finding difficulties in conversational speech and no paraphasic errors were noted. Receptive language appeared intact. The patient's affect was congruent with mood and her mood was largely neutral to positive. The patient was alert and participated in testing as instructed, though she experienced some mild difficulties with understanding testing instructions at times. She was cooperative throughout the session. Her pace was steady. She showed no difficulties with frustration tolerance. The patient's social interactions were unremarkable and consistent with the setting. The patient reported having a headache during testing and stated that she was recovering from being sick. No frank attentional lapses were appreciated.  Neuropsychology Note  [name] completed 185 minutes of neuropsychological testing with technician, Rhett Cella, BA, under the supervision of Aleene Hurry, PsyD., Clinical Neuropsychologist. The patient did not appear overtly distressed by the testing session, per behavioral observation or via self-report to the technician. Rest breaks were offered.   Clinical Decision Making: In considering the patient's current level of functioning, level of presumed impairment, nature of symptoms, emotional and behavioral responses during clinical interview, level of literacy, and observed level of motivation/effort, a battery of tests was selected by Dr. Georgeanne King during initial consultation on 08/31/2023. This was communicated to the technician. Communication between the neuropsychologist and technician was ongoing throughout the testing session and changes were made as deemed necessary  based on patient performance on testing, technician observations and additional pertinent factors such as those listed above.  Tests Administered: Automatic Data Edition (BNT-2) Brief Visuospatial Memory Test-Revised (BVMT-R) Benton Judgment of Line Orientation (JOLO) Clock Drawing Test Controlled Oral Word Association Test (FAS & Animals) Orthoptist System (D-KEFS), select subtests Hopkins Verbal Learning Test - Revised (HVLT-R) Rey Complex Figure Test (RCFT), select subtests Symbol Digit Modalities Test (SDMT), oral version Trail Making Test (TMT; Part A & B) Wechsler Adult Intelligence Scale-Fourth Edition (WAIS-IV), select subtests Wechsler Memory Scale-Fourth Edition (WMS-IV) , select subtests Wechsler Test of Adult Reading (WTAR) [place]  Card Sorting Test [phone]) Geriatric Depression Scale-Short Form (GDS-SF) Geriatric Anxiety Inventory (GAI)  Results: Note: This summary of test scores accompanies the interpretive report and should not be interpreted by unqualified individuals or in isolation without reference to the report. Test scores are relative to age, gender, and educational history as available and appropriate.   Measurement properties of test scores: IQ, Index, and Standard Scores (SS): Mean = 100; Standard Deviation = 15; Scaled Scores (ss): Mean = 10; Standard Deviation = 3; Z scores (Z): Mean = 0; Standard Deviation = 1; T scores (T); Mean = 50; Standard Deviation = 10  Intellectual/Premorbid Functioning Estimate   Norm Score Percentile  Range  Wechsler Test of Adult Reading  SS = 107 68 %ile Average   ATTENTION AND WORKING MEMORY    Norm Score Percentile  Range  WAIS-IV          Digit Span  ss = 11 63 %ile Average   DSF  ss = 10 50 %ile Average   Span:    6      DSB  ss = 14 91 %ile Above Average   Span:    7      DSS  ss = 8 25 %ile Average   Span:  6      PROCESSING SPEED    Norm Score Percentile  Range  SDMT (Oral)  SD  = -2 2 %ile Below Average    LANGUAGE    Norm Score Percentile  Range  [place] Naming Test (BNT-2)  t = 44 27 %ile Average  COWAT          FAS  t = 39 13 %ile Low Average   Animals  t = 31 3 %ile Below Average   EXECUTIVE FUNCTIONING    Norm Score Percentile Range  DKEFS - Color-Word Interference          Color Naming  ss = 3 1 %ile Exceptionally Low   Word Reading  ss = 9 37 %ile Average   Inhibition  ss = 5 5 %ile Below Average   Errors  ss = 2 0.4 %ile Exceptionally Low   Inhibition Switching  ss = 1 0.1 %ile Exceptionally Low   Errors  ss = 1 0.1 %ile Exceptionally Low  Trails A  t = 15 <0.1 %ile Exceptionally Low   Errors    0     Trails B  t = 39 13 %ile Low Average   Errors    2     [place]  Card Sorting Test (WCST)          Total Errors  t = 34 5 %ile Below Average   Perseverative Errors  t = 45 30 %ile Average   Non-Perseverative Errors  t = 23 0.3 %ile Exceptionally Low   % Conceptual Responses  t = 33 5 %ile Below Average   Categories Completed     11 to 16 %ile Low Average   Trials to First Category Complete     6 to 10 %ile Low to Below Average   Failure to Maintain Set     11 to 16 %ile Low Average   MEMORY    Norm Score Percentile  Range  BVMT-R          Trial 1  t = 44.0 27 %ile Average   Trial 2  t = 27 1 %ile Exceptionally Low   Trial 3  t = <20 0.1 %ile Exceptionally Low   Total Recall  t = 24.0 0.5 %ile Exceptionally Low   Learning  t = <30 < 2 %ile Exceptionally Low   Delayed Recall  t = 25.0 1 %ile Exceptionally Low   % Retained    100 >16 %ile WNL   Hits     >16 %ile WNL   False Alarms     >16 %ile WNL   Recognition Discriminability     >16 %ile WNL  HVLT          Total Recall  t = 24.0 0.5 %ile Exceptionally Low   Delayed Recall  t = <20 0.1 %ile Exceptionally Low   %Retention  t = <20 0.1 %ile Exceptionally Low   Recognition Discriminability  t = 31.0 3 %ile Below Average  Wechsler Memory Scale, 4th Edition (WMS-4)         Log. Mem. Immediate  Recall  ss = 6 9 %ile Low Average   Logical Memory Delayed Recall  ss = 6 9 %ile Low Average   Logical Recognition    17th-25th  %ile Average to Low Average   VISUAL-SPATIAL    Norm Score Percentile  Range  Benton JOLO  ss = 7 16 %ile Low Average  Rey Complex Figure Copy       <=  1 %ile Exceptionally Low  Clock          PERSONALITY AND BEHAVIORAL FUNCTIONING      Score/Interpretation  GDS-SF Raw       11  GDS-SF Severity       Severe.  GAI Raw       12  GAI Severity       Elevated   Feedback to Patient: Alajah Witman will return on 10/25/2023 for an interactive feedback session with Dr. Georgeanne King at which time her test performances, clinical impressions and treatment recommendations will be reviewed in detail. The patient understands she can contact our office should she require our assistance before this time.  185 minutes spent face-to-face with patient administering standardized tests, 30 minutes spent scoring Radiographer, therapeutic). [CPT [id number], [id number]]  Full report to follow.

## 2023-10-17 ENCOUNTER — Ambulatory Visit (HOSPITAL_COMMUNITY): Admitting: Licensed Clinical Social Worker

## 2023-10-17 ENCOUNTER — Encounter (HOSPITAL_COMMUNITY): Payer: Self-pay

## 2023-10-18 ENCOUNTER — Encounter: Admitting: Psychology

## 2023-10-18 DIAGNOSIS — G3184 Mild cognitive impairment, so stated: Secondary | ICD-10-CM | POA: Diagnosis not present

## 2023-10-18 NOTE — Telephone Encounter (Signed)
 If she's getting worse despite abx and steroids, recommend in person evaluation. If no availability, recommend UC or ED. Her recent CXR was clear. Thanks.

## 2023-10-19 ENCOUNTER — Ambulatory Visit: Admitting: Psychology

## 2023-10-19 NOTE — Telephone Encounter (Signed)
 Scheduled patient with [facility] tomorrow for an acute visit.

## 2023-10-19 NOTE — Telephone Encounter (Signed)
 I responded to the message yesterday. Patient is aware of appt, correct? Thanks!

## 2023-10-20 ENCOUNTER — Ambulatory Visit: Admitting: Primary Care

## 2023-10-20 ENCOUNTER — Other Ambulatory Visit (HOSPITAL_COMMUNITY): Payer: Self-pay

## 2023-10-20 ENCOUNTER — Encounter: Payer: Self-pay | Admitting: Primary Care

## 2023-10-20 ENCOUNTER — Other Ambulatory Visit: Payer: Self-pay | Admitting: Primary Care

## 2023-10-20 ENCOUNTER — Telehealth: Payer: Self-pay

## 2023-10-20 VITALS — BP 128/81 | HR 94 | Temp 97.7°F | Ht 64.0 in | Wt 196.0 lb

## 2023-10-20 DIAGNOSIS — Z87891 Personal history of nicotine dependence: Secondary | ICD-10-CM

## 2023-10-20 DIAGNOSIS — J440 Chronic obstructive pulmonary disease with acute lower respiratory infection: Secondary | ICD-10-CM

## 2023-10-20 DIAGNOSIS — J411 Mucopurulent chronic bronchitis: Secondary | ICD-10-CM

## 2023-10-20 DIAGNOSIS — J209 Acute bronchitis, unspecified: Secondary | ICD-10-CM | POA: Diagnosis not present

## 2023-10-20 MED ORDER — DOXYCYCLINE HYCLATE 100 MG PO TABS: 100.0000 mg | ORAL_TABLET | Freq: Two times a day (BID) | ORAL | 0 refills | Status: DC

## 2023-10-20 MED ORDER — TRELEGY ELLIPTA 200-62.5-25 MCG/ACT IN AEPB
1.0000 | INHALATION_SPRAY | Freq: Every day | RESPIRATORY_TRACT | Status: DC
Start: 2023-10-20 — End: 2024-04-01

## 2023-10-20 MED ORDER — ALBUTEROL SULFATE (2.5 MG/3ML) 0.083% IN NEBU: 2.5000 mg | INHALATION_SOLUTION | Freq: Four times a day (QID) | RESPIRATORY_TRACT | 12 refills | Status: AC | PRN

## 2023-10-20 NOTE — Telephone Encounter (Signed)
 Trelegy showing as $0.00 in test claims

## 2023-10-20 NOTE — Telephone Encounter (Signed)
 Please let patient know test claim for Trelegy shows 0 dollar, I want her to let us  know how she is doing with sample and we can send in prescription if she likes new inhaler

## 2023-10-20 NOTE — Telephone Encounter (Signed)
 Can you do benefits investigation for Trelegy, either 100 and 

## 2023-10-20 NOTE — Patient Instructions (Addendum)
  -  CHRONIC OBSTRUCTIVE PULMONARY DISEASE (COPD) WITH EMPHYSEMA: COPD with emphysema is a long-term lung condition that makes it hard to breathe. You are experiencing an acute exacerbation, which means your symptoms have suddenly worsened. We will provide you with a sample of Trelegy 200 mcg to use once daily in the morning for two weeks. We will check on coverage Trelegy, if too expensive we will resume Wixela at a medium dose of 250 mcg. We will also refill your nebulizer prescription, and you should use it for 3-5 more days until your symptoms improve. Continue taking Mucinex 600 mg twice a day to help loosen congestion. You can use Delsym cough syrup or Mucinex DM for cough suppression, but avoid using pseudoephedrine.  -CHRONIC BRONCHITIS: Chronic bronchitis is a long-term inflammation of the airways in your lungs. You are experiencing a recent exacerbation, which means your symptoms have worsened. We will prescribe doxycycline for 7 days to help with the infection. Remember to wear sunscreen while taking this medication, as it can increase your risk of sunburn. Monitor for symptoms such as fever not improving with Tylenol , oxygen saturation below 88%, confusion, or difficulty breathing not relieved by your nebulizer.   INSTRUCTIONS:  Please follow up with your scheduled breathing test on Monday and your lung cancer screening CT scan on June 2nd. If your symptoms do not improve or if you experience any of the warning signs mentioned, please contact our office immediately.  Follow-up September with Canary Ceo or new pulmonologist (former [facility] patient)

## 2023-10-20 NOTE — Progress Notes (Signed)
 @Patient  ID: Helen Sandoval, female    DOB: 04-02-1961, 63 y.o.   MRN: [id number]  Chief Complaint  Patient presents with   Follow-up    Worsening s.o.b- pt states she has a hard time inhaling. Pt states she developed a cough Last Wednesday, wet cough with green mucous. Denies fever.     Referring provider: Virgle Grime, MD  HPI: 63 year old female, former smoker followed for emphysema and chronic bronchitis.  She is a former patient of Dr. Glenis Langdon and last seen in office on 10/11/23 by [facility] NP.  Past medical history significant for OSA on BiPAP, multiple sclerosis, anxiety and depression, HLD  10/20/2023 Discussed the use of AI scribe software for clinical note transcription with the patient, who gave verbal consent to proceed.  History of Present Illness   Helen Sandoval "[name]" is a 63 year old female with chronic bronchitis and emphysema who presents with difficulty breathing and congestion. She was seen last week for acute bronchitis, treated with azithromycin  and prednisone . No significant improvement. Continues to complain of chest congestion.  She experiences difficulty breathing and congestion, describing an inability to take a deep breath and a 'weird sound' when breathing. She also notes crackles and back pain. With a history of pneumonia, she is concerned about recurrence. She was treated with a Z-Pak and prednisone  last week, which only started helping today.  She has a history of chronic bronchitis and emphysema. She is a former smoker, having quit in 2024. She underwent a breathing test in May of last year that appeared normal. A lung cancer screening CT scan is scheduled for June 2nd. Her medication was switched from Breztri  to [facility] in November due to cost, but she has not yet started [place] as she was finishing Breztri  samples. She was not consistently using Breztri  inhaler daily until recently.  She is currently taking Mucinex and a nasal spray, ipratropium, for  her symptoms. She was switched from omeprazole to Protonix for gastrointestinal issues. She has two pills of prednisone  left and has completed her course of azithromycin . She is about to run out of her nebulizer medication.  Allergies  Allergen Reactions   Clindamycin Anaphylaxis   Indomethacin Hives   Levofloxacin Hives   Penicillins Anaphylaxis   Penicillins Cross Reactors Anaphylaxis   Sulfa Antibiotics Swelling    Blister (steven john syndrome)   Levaquin [Levofloxacin Hemihydrate] Hives   Lipitor  [Atorvastatin Calcium]    Multihance  [Gadobenate] Hives    Pt having hives/ rash on neck and chest    Sulfamethoxazole-Trimethoprim    Zocor [Simvastatin] Other (See Comments)    Other reaction(s): MUSCLE PAIN Muscle aches    Immunization History  Administered Date(s) Administered   Influenza, Seasonal, Injecte, Preservative Fre 04/01/2009   Influenza,inj,Quad PF,6+ Mos 01/25/2016   Influenza,inj,quad, With Preservative 01/23/2017   Influenza,trivalent, recombinat, inj, PF 02/02/2023   Influenza-Unspecified 01/30/2013, 02/20/2014, 06/26/2017, 01/21/2018, 02/14/2018, 03/16/2021   MMR 09/15/2009   PFIZER(Purple Top)SARS-COV-2 Vaccination 07/06/2019, 07/28/2019, 01/14/2020   PPD Test 09/15/2010   Pfizer(Comirnaty)Fall Seasonal Vaccine 12 years and older 02/02/2023   Pneumococcal Polysaccharide-23 10/29/2014   Tdap 09/07/2009   Zoster Recombinant(Shingrix) 02/27/2019    Past Medical History:  Diagnosis Date   Ankle fracture    Depression    Diabetes (HCC)    Fatty liver    Osteopenia    Osteoporosis    Scarlet fever    Shingles    Sleep apnea    Spina bifida    Splenomegaly  Thrombocytopenia (HCC)    Vitamin D  deficiency     Tobacco History: Social History   Tobacco Use  Smoking Status Former   Current packs/day: 0.00   Types: Cigarettes   Quit date: 11/21/2022   Years since quitting: 0.9  Smokeless Tobacco Never  Tobacco Comments   1 cigarrettes per day.  09/28/2022 Tay   Counseling given: Not Answered Tobacco comments: 1 cigarrettes per day. 09/28/2022 Tay   Outpatient Medications Prior to Visit  Medication Sig Dispense Refill   albuterol  (VENTOLIN  HFA) 108 (90 Base) MCG/ACT inhaler Inhale 2 puffs into the lungs every 6 (six) hours as needed for wheezing or shortness of breath. 8 g 6   aspirin EC 81 MG tablet Take 81 mg by mouth every other day.     buPROPion  (WELLBUTRIN  XL) 300 MG 24 hr tablet Take 1 tablet (300 mg total) by mouth in the morning. 30 tablet 2   Cladribine (MAVENCLAD, 9 TABS, PO) Take by mouth.     clonazePAM  (KLONOPIN ) 1 MG tablet Take 1 tablet (1 mg total) by mouth 2 (two) times daily. 60 tablet 5   cyclobenzaprine  (FLEXERIL ) 10 MG tablet Take 1 tablet (10 mg total) by mouth at bedtime. (Patient taking differently: Take 10 mg by mouth 3 (three) times daily as needed.) 90 tablet 3   ezetimibe (ZETIA) 10 MG tablet Take 10 mg by mouth daily.      gabapentin  (NEURONTIN ) 300 MG capsule TAKE 1 CAPSULE BY MOUTH THREE TIMES DAILY 270 capsule 0   ipratropium (ATROVENT) 0.03 % nasal spray Place 2 sprays into both nostrils daily at 2 PM.     Melatonin 10 MG TABS Take 10 mg by mouth daily.     metFORMIN (GLUCOPHAGE-XR) 500 MG 24 hr tablet Take 2 tablets by mouth 2 (two) times daily with a meal. 1 tablet in morning and 2 tablets at night     MYRBETRIQ  50 MG TB24 tablet Take 1 tablet by mouth once daily 90 tablet 0   pantoprazole (PROTONIX) 40 MG tablet Take 40 mg by mouth every morning.     phentermine  37.5 MG capsule Take 1 capsule (37.5 mg total) by mouth every morning. 90 capsule 1   predniSONE  (DELTASONE ) 10 MG tablet 4 tabs for 2 days, then 3 tabs for 2 days, 2 tabs for 2 days, then 1 tab for 2 days, then stop 20 tablet 0   Spacer/Aero-Holding Chambers (EQ SPACE CHAMBER ANTI-STATIC) DEVI USE AS DIRECTED WITH  INHALERS 1 each 0   traZODone  (DESYREL ) 100 MG tablet Take 1 tablet (100 mg total) by mouth at bedtime. 30 tablet 2    varenicline  (CHANTIX ) 1 MG tablet Take 1 tablet by mouth twice daily 60 tablet 0   fluticasone-salmeterol (WIXELA INHUB ) 100-50 MCG/ACT AEPB Inhale 1 puff into the lungs 2 (two) times daily. 60 each 12   DULoxetine  (CYMBALTA ) 30 MG capsule Take 1 capsule (30 mg total) by mouth daily for 7 days, THEN 2 capsules (60 mg total) daily for 23 days. 53 capsule 1   albuterol  (PROVENTIL ) (2.5 MG/3ML) 0.083% nebulizer solution Take 3 mLs (2.5 mg total) by nebulization every 6 (six) hours as needed for wheezing or shortness of breath. 75 mL 12   azithromycin  (ZITHROMAX ) 250 MG tablet Take 2 tablets on day one then take 1 tablet daily for four additional days (Patient not taking: Reported on 10/20/2023) 6 tablet 0   tirzepatide (MOUNJARO) 7.5 MG/0.5ML Pen Inject 7.5 mg into the skin once a week.  venlafaxine  XR (EFFEXOR  XR) 37.5 MG 24 hr capsule Take 2 capsules (75 mg total) by mouth daily for 7 days, THEN 1 capsule (37.5 mg total) daily for 7 days. 21 capsule 0   zinc gluconate 50 MG tablet Take 50 mg by mouth daily.     No facility-administered medications prior to visit.   Review of Systems  Review of Systems  Constitutional: Negative.   HENT:  Positive for congestion.   Respiratory:  Positive for cough.    Physical Exam  BP 128/81 (BP Location: Left Arm, Patient Position: Sitting, Cuff Size: Large)   Pulse 94   Temp 97.7 F (36.5 C) (Temporal)   Ht 5\' 4"  (1.626 m)   Wt 196 lb (88.9 kg)   SpO2 96%   BMI 33.64 kg/m  Physical Exam Constitutional:      Appearance: Normal appearance.  HENT:     Head: Normocephalic and atraumatic.  Cardiovascular:     Rate and Rhythm: Normal rate and regular rhythm.  Pulmonary:     Effort: Pulmonary effort is normal.     Breath sounds: Rhonchi present.  Musculoskeletal:        General: Normal range of motion.  Skin:    General: Skin is warm and dry.  Neurological:     General: No focal deficit present.     Mental Status: She is oriented to person,  place, and time. Mental status is at baseline.  Psychiatric:        Mood and Affect: Mood normal.        Behavior: Behavior normal.        Thought Content: Thought content normal.        Judgment: Judgment normal.      Lab Results:  CBC    Component Value Date/Time   WBC 5.1 10/26/2022 1523   WBC 5.8 09/28/2022 1539   RBC 4.11 10/26/2022 1523   RBC 3.93 09/28/2022 1539   HGB 13.0 10/26/2022 1523   HCT 38.6 10/26/2022 1523   PLT 129 (L) 10/26/2022 1523   MCV 94 10/26/2022 1523   MCH 31.6 10/26/2022 1523   MCH 32.2 08/31/2021 1453   MCHC 33.7 10/26/2022 1523   MCHC 33.8 09/28/2022 1539   RDW 13.6 10/26/2022 1523   LYMPHSABS 0.7 10/26/2022 1523   MONOABS 0.3 09/28/2022 1539   EOSABS 0.1 10/26/2022 1523   BASOSABS 0.0 10/26/2022 1523    BMET    Component Value Date/Time   NA 141 10/26/2022 1523   K 4.1 10/26/2022 1523   CL 102 10/26/2022 1523   CO2 26 10/26/2022 1523   GLUCOSE 98 10/26/2022 1523   GLUCOSE 154 (H) 03/02/2021 1318   BUN 19 10/26/2022 1523   CREATININE 0.92 10/26/2022 1523   CREATININE 1.02 (H) 03/02/2021 1318   CALCIUM 9.3 10/26/2022 1523   GFRNONAA >60 03/02/2021 1318   GFRAA 75 02/27/2020 1316    BNP No results found for: "BNP"  ProBNP No results found for: "PROBNP"  Imaging: DG Chest 2 View Result Date: 10/11/2023 EXAM: 2 VIEW(S) XRAY OF THE CHEST 10/11/2023 02:45:03 PM COMPARISON: CT chest lung cancer screening 10/22/2022. CLINICAL HISTORY: Persistent cough. FINDINGS: LUNGS AND PLEURA: No focal pulmonary opacity. No pulmonary edema. No pleural effusion. No pneumothorax. HEART AND MEDIASTINUM: No acute abnormality of the cardiac and mediastinal silhouettes. BONES AND SOFT TISSUES: No acute osseous abnormality. IMPRESSION: 1. No acute process. Electronically signed by: Audree Leas MD 10/11/2023 04:11 PM EDT RP Workstation: [id number]     Assessment &  Plan:   1. Mucopurulent chronic bronchitis (HCC)  2. Acute bronchitis with COPD  (HCC) (Primary)  Assessment and Plan    Acute bronchitis with COPD/emphysema Prolonged exacerbation of COPD with emphysema, presenting with increased congestion, wheezing, difficulty breathing, and crackles. Previous azithromycin  and prednisone  treatment with minimal improvement. Recent chest x-ray was normal, and a CT scan is scheduled for Sep 22, 2023. She was switched from Breztri  to [facility] due to cost. Current exacerbation may require a higher dose of inhaled corticosteroids. - Provide a sample of Trelegy 200 mcg for two weeks, one puff daily in the morning. - Pharmacy to provide benefits investigation for Trelegy, if not covered under patient's insurance would resume Wixela at a medium dose of 250 mcg. - Refill nebulizer prescription and advise use for 3-5 more days until symptoms improve. - Continue Mucinex 600 mg BID to loosen congestion. - Allow use of Delsym cough syrup or Mucinex DM for cough suppression, avoiding pseudoephedrine. - Prescribe doxycycline for 7 days, with instructions to wear sunscreen due to increased risk of sunburn. - Advise to monitor for symptoms such as fever not improving with Tylenol , oxygen saturation below 88%, confusion, or difficulty breathing not relieved by nebulizer.  Recording duration: 10 minutes      Antonio Baumgarten, NP 10/20/2023

## 2023-10-20 NOTE — Telephone Encounter (Signed)
 Pharmacy Patient Advocate Encounter   Received notification from Pt Calls Messages that prior authorization for Trelegy 100/200 is required/requested.   Insurance verification completed.   The patient is insured through [employer] .   Per test claim: The current 30 day co-pay is, $0.00.  No PA needed at this time. This test claim was processed through [facility]- copay amounts may vary at other pharmacies due to pharmacy/plan contracts, or as the patient moves through the different stages of their insurance plan.

## 2023-10-23 ENCOUNTER — Ambulatory Visit (HOSPITAL_BASED_OUTPATIENT_CLINIC_OR_DEPARTMENT_OTHER)
Admission: RE | Admit: 2023-10-23 | Discharge: 2023-10-23 | Disposition: A | Discharge status: Home / Self Care | Source: Ambulatory Visit | Attending: Acute Care | Admitting: Acute Care

## 2023-10-23 DIAGNOSIS — Z87891 Personal history of nicotine dependence: Secondary | ICD-10-CM | POA: Diagnosis not present

## 2023-10-23 DIAGNOSIS — F17210 Nicotine dependence, cigarettes, uncomplicated: Secondary | ICD-10-CM | POA: Insufficient documentation

## 2023-10-23 DIAGNOSIS — Z122 Encounter for screening for malignant neoplasm of respiratory organs: Secondary | ICD-10-CM | POA: Diagnosis not present

## 2023-10-23 NOTE — Progress Notes (Signed)
 Helen Sandoval

## 2023-10-24 ENCOUNTER — Ambulatory Visit (INDEPENDENT_AMBULATORY_CARE_PROVIDER_SITE_OTHER): Admitting: Neurology

## 2023-10-24 ENCOUNTER — Encounter: Payer: Self-pay | Admitting: Neurology

## 2023-10-24 VITALS — BP 133/88 | HR 106 | Ht 64.0 in | Wt 197.0 lb

## 2023-10-24 DIAGNOSIS — R2689 Other abnormalities of gait and mobility: Secondary | ICD-10-CM | POA: Diagnosis not present

## 2023-10-24 DIAGNOSIS — R269 Unspecified abnormalities of gait and mobility: Secondary | ICD-10-CM | POA: Diagnosis not present

## 2023-10-24 DIAGNOSIS — F418 Other specified anxiety disorders: Secondary | ICD-10-CM | POA: Diagnosis not present

## 2023-10-24 DIAGNOSIS — R5383 Other fatigue: Secondary | ICD-10-CM | POA: Diagnosis not present

## 2023-10-24 DIAGNOSIS — G4733 Obstructive sleep apnea (adult) (pediatric): Secondary | ICD-10-CM | POA: Diagnosis not present

## 2023-10-24 DIAGNOSIS — M5416 Radiculopathy, lumbar region: Secondary | ICD-10-CM | POA: Diagnosis not present

## 2023-10-24 DIAGNOSIS — R413 Other amnesia: Secondary | ICD-10-CM

## 2023-10-24 DIAGNOSIS — M6281 Muscle weakness (generalized): Secondary | ICD-10-CM | POA: Diagnosis not present

## 2023-10-24 DIAGNOSIS — G35 Multiple sclerosis: Secondary | ICD-10-CM | POA: Diagnosis not present

## 2023-10-24 DIAGNOSIS — R296 Repeated falls: Secondary | ICD-10-CM | POA: Diagnosis not present

## 2023-10-24 NOTE — Progress Notes (Signed)
 [facility]  PATIENT: Helen Sandoval DOB: 1960-10-28  REFERRING DOCTOR OR PCP:  Helen Sandoval. ([facility])   Helen Sandoval (new PCP at [facility]) SOURCE: patient and records from [facility]  _________________________________   HISTORICAL  CHIEF COMPLAINT:  Chief Complaint  Patient presents with   Follow-up    RM 10 with sister. Last seen 08/21/23.  Here for initial CPAP f/u.    HISTORY OF PRESENT ILLNESS:  Helen Sandoval is a 63yo woman who was diagnosed with relapsing remitting MS in 2015.     Update 10/24/2023: Her MS appears to be stable.  Specifically, MRIs of the brain in 2022 and 2024 showed no new lesions.  She is not on a DMT but completed the second year of Mavenclad in April 2021.   After the first Mavenclad, she did well but lymphocyte count was low at 0.1.  Therefore, she did Valtrex  for few months.  They bounced back to 0.7 before the second year of Mavenclad.     Her most recent lymphocyte count from 09/28/2022 was low normal at 0.8.  She also has mildly low platelet count     She has a new VPAP.    (VPAP EPAP range 8-18 with PS5) PSG 07/24/2023 showed Sleep Disorder Severe OSA with an AHI=48/h.   OSA was more severe supine (supine OSA AHI = 79.6/h. No stage N3 sleep was recorded.  Only 4.5 minutes REM sleep was recorded with a REM latency of 3 hr 18 minutes.  Sleep efficiency was 74%. No significant PLMS was recorded Mild nocturnal hypoxemia with 7% of the night below SaO2% of 90%  Download is good with 77% 4 hour use and AHI at 4.4 (was 48 at baseline).  She was given a nasal pillow/mouth facemask.  She likes the fact it is less bulky but it is leaking tremendously and she has needed to use her full facemask.  She showed me a picture and it looks like her nostrils are not completely covered.  I advised her to discuss further with the DME company to see if they can get her a better fitted mask  She continues to note some short-term memory  issues but these are stable for the most part.  She had RSV and cognition was worse for a couple weks and is nowdoing better again.     She has noted more issues with STM x 6 months, worsening after her mom passed in November 2024.  Prior to this time, she would sometimes be forgetful though had no difficulty with activities of daily living.  Her sister has needed to help her more over the past few months.  Besides difficulty with memory, she also has reduced processing and has more difficulty doing complex tasks  We discussed that memory problems could be multifactorial.  We know that she has several medical conditions that might play a role in reduced focus/attention and subsequently memory including anxiety/depression, multiple sclerosis and sleep apnea.  The amyloid beta 42/40 ratio was normal.  [id number] was mildly elevated (could also be due to other causes such as her MS).  She did neurocognitive testing and will be getting results later this week.   She has just completed PT and notes a benefit with improved gait.    Helen Sandoval  Her legs feel a bit weak, similar to last year.  Her legs shake going downstairs due to weakness.  She does better going up.  She notes some dysesthesias helped by gabapentin .  She has left > right leg weakness.   If she squats she has trouble getting up..  When she shows me where the pain is, she points to the region of the trochanteric bursa.  She notes that area is tender if she pushes  Tremors are bothersome. .  Eating, writing and other activities with the hands is more difficult.  She has chronic bronchitis so beta-blockers are somewhat contraindicated..    She no longer smokes.   She has COPD and tremors are sometimes worse with the nebulizer.    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  Of note, her mother also had tremors.  Clonazepam  may help a little bit.   She has depression and anxiety and is on Duloxetine , buspirone  and wellbutrin .   Her mom died in 11/10/24and  she was much worse for a few months   She saw psychiatry.      Urinary urgency and Incontinence is better with Myrbetriq .    Vision is unchanged.       She has lost weight after being diagnosed  with NIDDM type 2 .  She is now on Mounjoro with metformin and has lost 11 pounds.  Her HgbA1c is better.   She has NAFLD (Fatty liver).    Vascular risks:   She has NIDDM.  HTN, Tobacco x 35-40 years  MS History:    In 2008, she had headaches and poor balance and was told the MRI had some white matter foci at that time.    She had spinal stenosis and needed surgery in 2014.  The surgeon felt her gait and other issues were decreased out of proportion to the extent of spine disease and referred her to Neurology.   Besides gait issues, she also had double vision, blurry vision, cognitive difficulty and incontinence in early 2015. She saw Dr. Wendy Sandoval in [place]. An MRI of the brain, CSF and visual evoked potentials were consistent with multiple sclerosis.    Initially, she was placed on Tecfidera. Due to progression with more neurologic symptoms, she was switched to Tysabri .   Her last MRI was done 10/2014 but we do not have the images.    Imaging: MRI brain 05/14/2020 shows T2/FLAIR hyperintense foci in the hemispheres, pons and left basal ganglia.  Most of the foci are nonspecific.  A few are periventricular and radially oriented.  The focus in the left basal ganglia could also represent ischemic sequela.    None of the foci appear to be acute and they do not enhance.    There are no new lesions compared with 05/12/2018 MRI.  MRI of the cervical spine shows 1 or 2 small subtle foci within the spinal cord adjacent to C4-C5 and C6.  These have been seen on previous MRIs.  Though nonspecific, this is consistent with a chronic demyelinating plaque.  Minimal chronic compressive myelopathic signal cannot be ruled out.  There are no new lesions and no enhancing lesions.   Prior C4-C6 ACDF.  There is no nerve root  compression or spinal stenosis at these levels.   Mild spinal stenosis and mild foraminal narrowing at C3-C4 and C6-C7 that does not lead to any nerve root compression.  Degenerative changes are stable compared to the 2020 MRI.  MRI of the brain 05/19/2021 showed no new lesions.  MRI of the cervical spine 05/19/2021 showed no new lesions.  MRI of the brain and cervical spine 11/2022 showed no new MS lesions.  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 There is moderate facet hypertrophy at L4-L5 with moderately severe facet hypertrophy at L5-S1.    REVIEW OF SYSTEMS: Constitutional: No fevers, chills, sweats, or change in appetite.   She notes fatigue Eyes: Reports visual changes and double vision.  No eye pain Ear, nose and throat: No hearing loss, ear pain, nasal congestion, sore throat Cardiovascular: No chest pain, palpitations Respiratory:  No shortness of breath at rest or with exertion.   She has snoring and coughing but no wheezes GastrointestinaI: No nausea, vomiting, abdominal pain.  She notes diarrhea and some fecal incontinence Genitourinary:  No dysuria, urinary retention or frequency. She has had some incontinence Musculoskeletal:  No neck pain, back pain Integumentary: No rash, pruritus, skin lesions Neurological: as above Psychiatric: Some depression at this time.  Some anxiety Endocrine: No palpitations, diaphoresis, change in appetite, change in weigh or increased thirst Hematologic/Lymphatic:  No anemia, purpura, petechiae. Allergic/Immunologic: No itchy/runny eyes, nasal congestion, recent allergic reactions, rashes  ALLERGIES: Allergies  Allergen Reactions   Clindamycin Anaphylaxis   Indomethacin Hives   Levofloxacin Hives   Penicillins Anaphylaxis   Penicillins Cross Reactors Anaphylaxis   Sulfa  Antibiotics Swelling    Blister (steven john syndrome)   Levaquin [Levofloxacin Hemihydrate] Hives   Lipitor  [Atorvastatin Calcium]    Multihance  [Gadobenate] Hives    Pt having hives/ rash on neck and chest    Sulfamethoxazole-Trimethoprim    Zocor [Simvastatin] Other (See Comments)    Other reaction(s): MUSCLE PAIN Muscle aches    HOME MEDICATIONS:  Current Outpatient Medications:    albuterol  (PROVENTIL ) (2.5 MG/3ML) 0.083% nebulizer solution, Take 3 mLs (2.5 mg total) by nebulization every 6 (six) hours as needed for wheezing or shortness of breath., Disp: 75 mL, Rfl: 12   albuterol  (VENTOLIN  HFA) 108 (90 Base) MCG/ACT inhaler, Inhale 2 puffs into the lungs every 6 (six) hours as needed for wheezing or shortness of breath., Disp: 8 g, Rfl: 6   aspirin EC 81 MG tablet, Take 81 mg by mouth every other day., Disp: , Rfl:    Cladribine (MAVENCLAD, 9 TABS, PO), Take by mouth., Disp: , Rfl:    clonazePAM  (KLONOPIN ) 1 MG tablet, Take 1 tablet (1 mg total) by mouth 2 (two) times daily., Disp: 60 tablet, Rfl: 5   cyclobenzaprine  (FLEXERIL ) 10 MG tablet, Take 1 tablet (10 mg total) by mouth at bedtime. (Patient taking differently: Take 10 mg by mouth as needed.), Disp: 90 tablet, Rfl: 3   doxycycline  (VIBRA -TABS) 100 MG tablet, Take 1 tablet (100 mg total) by mouth 2 (two) times daily., Disp: 14 tablet, Rfl: 0   ezetimibe (ZETIA) 10 MG tablet, Take 10 mg by mouth daily. , Disp: , Rfl:    Fluticasone-Umeclidin-Vilant (TRELEGY ELLIPTA ) 200-62.5-25 MCG/ACT AEPB, Inhale 1 puff into the lungs daily., Disp: , Rfl:    gabapentin  (NEURONTIN ) 300 MG capsule, TAKE 1 CAPSULE BY MOUTH THREE TIMES DAILY, Disp: 270 capsule, Rfl: 0   ipratropium (ATROVENT) 0.03 % nasal spray, Place 2 sprays into both nostrils daily at 2 PM., Disp: , Rfl:    metFORMIN (GLUCOPHAGE-XR) 500 MG 24 hr tablet, Take 2 tablets by mouth 2 (two) times daily with a meal. 1 tablet in morning and 2 tablets at night, Disp: , Rfl:     MYRBETRIQ  50 MG TB24 tablet, Take 1 tablet by  mouth once daily, Disp: 90 tablet, Rfl: 0   pantoprazole (PROTONIX) 40 MG tablet, Take 40 mg by mouth every morning., Disp: , Rfl:    phentermine  37.5 MG capsule, Take 1 capsule (37.5 mg total) by mouth every morning., Disp: 90 capsule, Rfl: 1   Spacer/Aero-Holding Chambers (EQ SPACE CHAMBER ANTI-STATIC) DEVI, USE AS DIRECTED WITH  INHALERS, Disp: 1 each, Rfl: 0   traZODone  (DESYREL ) 100 MG tablet, Take 1 tablet (100 mg total) by mouth at bedtime., Disp: 30 tablet, Rfl: 2   varenicline  (CHANTIX ) 1 MG tablet, Take 1 tablet by mouth twice daily (Patient taking differently: Take 1 mg by mouth daily.), Disp: 60 tablet, Rfl: 0   buPROPion  (WELLBUTRIN  XL) 300 MG 24 hr tablet, Take 1 tablet (300 mg total) by mouth in the morning., Disp: 30 tablet, Rfl: 2   DULoxetine  (CYMBALTA ) 30 MG capsule, Take 1 capsule (30 mg total) by mouth daily for 7 days, THEN 2 capsules (60 mg total) daily for 23 days., Disp: 53 capsule, Rfl: 1  PAST MEDICAL HISTORY: Past Medical History:  Diagnosis Date   Ankle fracture    Depression    Diabetes (HCC)    Fatty liver    Osteopenia    Osteoporosis    Scarlet fever    Shingles    Sleep apnea    Spina bifida    Splenomegaly    Thrombocytopenia (HCC)    Vitamin D  deficiency     PAST SURGICAL HISTORY: Past Surgical History:  Procedure Laterality Date   ABDOMINAL HYSTERECTOMY     fibroids and heavy bleeding   BREAST BIOPSY Right    20+ yrs ago   CERVICAL FUSION      FAMILY HISTORY: Family History  Problem Relation Age of Onset   Bladder Cancer Mother    Colon cancer Mother    Colon cancer Father        diagnosed less than age of 68.    Bladder Cancer Maternal Grandmother    Leukemia Maternal Grandmother    Alzheimer's disease Neg Hx     SOCIAL HISTORY:  Social History   Socioeconomic History   Marital status: Divorced    Spouse name: Not on file   Number of children: Not on file   Years of education:  Not on file   Highest education level: Not on file  Occupational History   Not on file  Tobacco Use   Smoking status: Former    Current packs/day: 0.00    Types: Cigarettes    Quit date: 11/21/2022    Years since quitting: 0.9   Smokeless tobacco: Never   Tobacco comments:    1 cigarrettes per day. 09/28/2022 [place]  Substance and Sexual Activity   Alcohol use: Yes    Comment: occasional use. a few times a year.    Drug use: No   Sexual activity: Not on file  Other Topics Concern   Not on file  Social History Narrative   Pt lives with sister    Disability    Social Drivers of Health   Financial Resource Strain: Not on file  Food Insecurity: Not on file  Transportation Needs: Not on file  Physical Activity: Not on file  Stress: Not on file  Social Connections: Not on file  Intimate Partner Violence: Not on file     PHYSICAL EXAM  Vitals:   10/24/23 1516  BP: 133/88  Pulse: (!) 106  Weight: 197 lb (89.4 kg)  Height: 5\' 4"  (1.626  m)    Body mass index is 33.81 kg/m.   General: The patient is well-developed and well-nourished and in no acute distress  Neck: The neck is supple with good ROM and nontender.    Musculoskeletal:  She has tenderness over the trochanteric bursa of the left hip.  No tenderness over SI joint or piriformis muscles  Neurologic Exam  Mental status: The patient is alert and oriented x 3 at the time of the examination.  During the interaction today, she has apparent normal recent and remote memory, with reduced concentration abilit  Cranial nerves: Extraocular movements appear normal.  Facial strength was normal.  Trapezius strength is normal.  No dysarthria is noted.  Hearing appears normal and symmetric.  Motor: Low amplitude fast tremor in the hands and mouth, worsened with intention.  Muscle bulk is normal.   Tone is normal. Strength is  5 / 5 in all 4 extremities.   Sensory: She reports slightly reduced sensation to vibration in the right  leg    Coordination:  finger-nose-finger is performed well.  Heel-to-shin is performed well.  Gait and station: Station is normal.  She has a mildly reduced stride..  Tandem gait is poor.  Romberg is negative.  Reflexes: Deep tendon reflexes are symmetric and 2+ arms and 3+ knees with spread.   There is no ankle clonus..    ASSESSMENT AND PLAN    1. Multiple sclerosis (HCC)   2. Memory loss   3. Other fatigue   4. Gait abnormality   5. Depression with anxiety   6. OSA treated with BiPAP      1.   Her MS appears stable.  She completed Mavenclad in 2020 and 2021.  Last MRI in 2024 showed no new lesions.   2.   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   She is not a beta blocker candidate due to COPD/IDDM.   If the tremor gets a lot worse we could consider Mysoline 3.   Continue VPAP with PS 5.  She uses it nightly.  Advised to try to lose some weight.   She has had good compliance and efficacy on downloads.     She will discuss a different facemask with DME 4.   Continue Phentermine  37.5 mg in the morning for sleepiness, weight loss and fatigue.    5.  She has had more issues with cognition over the last year.  This is likely multifactorial due to the MS, mood issues and sleep apnea.  I do not think she has Alzheimer's disease.  The amyloid beta 42/40 ratio was normal.  She will be getting results from her neurocognitive evaluation later this week.  6.   She is seeing psychiatry.  Medications have been changed.   7.  Return in 6 [place] months or sooner if there are new or worsening neurologic symptoms.  This visit is part of a comprehensive longitudinal care medical relationship regarding the patients primary diagnosis of MS and related concerns.   Delainey Winstanley A. Godwin Lat, MD, PhD 10/24/2023, 4:30 PM Certified in Neurology, Clinical Neurophysiology, Sleep Medicine, Pain Medicine and Neuroimaging . [facility] [address] [place], [place] [address] [phone]

## 2023-10-25 ENCOUNTER — Encounter: Attending: Psychology | Admitting: Psychology

## 2023-10-25 DIAGNOSIS — G3184 Mild cognitive impairment, so stated: Secondary | ICD-10-CM

## 2023-10-25 DIAGNOSIS — R413 Other amnesia: Secondary | ICD-10-CM | POA: Insufficient documentation

## 2023-10-26 DIAGNOSIS — M810 Age-related osteoporosis without current pathological fracture: Secondary | ICD-10-CM | POA: Diagnosis not present

## 2023-10-26 NOTE — Telephone Encounter (Signed)
ATC x1.  LMTCB. 

## 2023-10-30 NOTE — Progress Notes (Unsigned)
 BH MD/PA/NP OP Progress Note  10/30/2023 9:27 AM Helen Sandoval  MRN:  [id number]  Visit Diagnosis:  No diagnosis found.  Assessment: Helen Sandoval is a 63 y.o. female with a history of MDD, relapsing remitting multiple sclerosis (diagnosed 2015), T2DM, OSA, essential tremor, chronic low back pain, and Vitamin D  deficiency who presented to [facility] at [facility] for initial evaluation on 07/25/2023.    At initial evaluation patient reported symptoms of depression including low mood, anhedonia, amotivation, decreased tearfulness, feelings of hopelessness, and passive SI. She consistently denied active SI and there are no acute safety concerns at this time.  Crisis resources were reviewed.  Notably patient's mother who had been significant support in her life passed in November 2024.  While patient denied depressive symptoms worsening at that time she had endorsed otherwise at visit with another provider.  Symptoms progressed past what would be consistent with typical grief response.  Notably patient does have a diagnosis of MS was made in 2015 and it is around this time that her depressive symptoms first onset.  She has undergone numerous medication trials since with periods of improvement followed by loss of benefit from medication.  Patient met criteria for MDD and late sleep phase syndrome on initial evaluation.  [name] presents for follow-up evaluation. Today, 10/30/23, patient reports that    mood has continued to improve however has not reached his baseline with the venlafaxine  taper to 150 mg.  She denies noting any adverse side effects with the decrease.  Memory concerns remain persistent.  Insomnia improved with trazodone  was increased to 100 mg daily.  Patient reported no benefit on the 50 mg dose.  After review of current presentation patient was still interested in cross tapering off of venlafaxine  and onto duloxetine .  Risk and benefits of this were reviewed.   We will monitor for any change in motivation symptoms with the decrease.  We also will monitor venerate symptoms noting that the concerns are likely multifactorial with depression and only being 1 piece.  If depression is stable memory concerns still persist that residual concerns are likely due to other factors such as her MS, Klonopin  prescription, or possible onset of dementia symptoms.  She is scheduled for neuropsych testing at the end of next month.  We will complete the cross taper as discussed below and follow up in a month.    Psychotherapeutic interventions were used during today's session. From 4:05 PM to 4:27 PM we used empathic listening techniques and provided support. Used supportive interviewing techniques to validate patients feelings. Worked on cognitive re framing techniques and focusing on behavioral activation.  Improvement was evidenced by patient's participation.  Plan: Bubble Pack all medications (unable to do a current pharmacy patient considering switching or getting medication dispensing machine) - Taper Effexor  to 75 mg daily for 7 days, then 37.5 mg for 7 days before discontinueing  - Start duloxetine  30 mg daily and then increase to 60 mg daily in 7 days - Continue Buspar  30 mg BID - Continue Wellbutrin  XL 300 mg daily - Continue Gabapentin  300 mg BID prescribed by neuro - Increase Trazodone  to 100 mg at bedtime - Neurology can consider discontinuing Klonopin  to 0.5 mg QHS for tremor as medication can also impact memory symptoms  - If insomnia issues recur and Klonopin  at discontinued could consider Dayvigo or Belsomra pending insurance approval - Phentermine  37.5 mg daily managed by neurology - R/o contributing medical conditions: CBC, TSH, CMP, Vit  D grossly wnl 10/26/2022;  - Therapy appointment with Camilo Cella on 5/27 - Information for MS support groups provided - Sleep study completed, already on BIPAP - Crisis resources reviewed - Follow up in a month  Chief  Complaint:  No chief complaint on file.  HPI: Helen Sandoval presents alongside her sister who participated in the session with patient's approval.  Bechtel reports that     from a psychiatric standpoint things have been going alright for her over the past month.  She has tolerated the decrease in venlafaxine  well denying any adverse side effects.  Insomnia remained rather unchanged on the trazodone  50 mg dose the patient increased back to 100 mg and notes that she slept well the past few nights.  She does still experience difficulties with memory and questions the influence of her depression on this.  We reviewed her memory concerns and the likely multifactorial nature of her symptoms.  Patient does have upcoming neuropsych testing at the end of May which we will evaluate her memory concerns more depth.  We reviewed some of the potential causes today including her depression, MS, and Klonopin  prescription.  We also reviewed that once his depression is stabilized any residual memory problems are unlikely to be secondary due to this.  That being the case we did discuss her report that mood symptoms have improved and she is doing well from a psychiatric standpoint.  On review [name] reports that she has been more active and motivated during the days.  She has been more engaged in activities both in and outside the home.  We reviewed if she feels like she has reached her baseline and question whether the former plan of trialing an alternative antidepressant would still make sense.  After discussion patient did feel that she was still not doing quite as well she had in the past.  She was interested in trying a different antidepressant medication.  We discussed a cross taper off of venlafaxine  and onto Cymbalta  reviewing the risk and benefits.  Primarily patient will monitor for any change in energy/motivation or coming off of venlafaxine .  She also focused on whether pain symptoms or any better controlled with the  initiation of Cymbalta .  While she has tried Cymbalta  in the past it was a brief trial likely long up to see any benefit from the medication.  In regards to the insomnia symptoms patient is noticing things are well controlled on trazodone .  Her pharmacist had recommended considering Belsomra or Dayvigo instead of Klonopin .  While we would agree that both these are better than Klonopin  for sleep management we do not feel they are necessary while the trazodone  has been effective.  Furthermore it be best to get off Klonopin  before starting either 1 of those medications.  She was encouraged to discuss this with her neurologist since he is prescribing the Klonopin .  Past Psychiatric History: Diagnoses: MDD Medication trials: Zoloft  (lost effectiveness); Prozac (ineffective); Cymbalta  (appears to have been brief trial); Trazodone  (loss of effect over time), Viibryd (couldn't get insurance to cover); Xanax ; Adderall (inattention, lost effect over time) Previous psychiatrist/therapist: Dr. Eligio Grumbling for an initial eval in January of 2025 Hospitalizations: denies Suicide attempts: denies SIB: denies Hx of violence towards others: denies   Substance use:  -- Etoh: 1 glass wine every few months  -- Tobacco: quit 11/20/22; on Chantix   -- Cannabis: denies  -- Illicit drugs: denies  -- Caffeine: 1 soda daily; denies use of coffee, tea, energy drinks  Past Medical History:  Past Medical History:  Diagnosis Date   Ankle fracture    Depression    Diabetes (HCC)    Fatty liver    Osteopenia    Osteoporosis    Scarlet fever    Shingles    Sleep apnea    Spina bifida    Splenomegaly    Thrombocytopenia (HCC)    Vitamin D  deficiency     Past Surgical History:  Procedure Laterality Date   ABDOMINAL HYSTERECTOMY     fibroids and heavy bleeding   BREAST BIOPSY Right    20+ yrs ago   CERVICAL FUSION     Family History:  Family History  Problem Relation Age of Onset   Bladder Cancer Mother     Colon cancer Mother    Colon cancer Father        diagnosed less than age of 47.    Bladder Cancer Maternal Grandmother    Leukemia Maternal Grandmother    Alzheimer's disease Neg Hx     Social History:  Social History   Socioeconomic History   Marital status: Divorced    Spouse name: Not on file   Number of children: Not on file   Years of education: Not on file   Highest education level: Not on file  Occupational History   Not on file  Tobacco Use   Smoking status: Former    Current packs/day: 0.00    Types: Cigarettes    Quit date: 11/21/2022    Years since quitting: 0.9   Smokeless tobacco: Never   Tobacco comments:    1 cigarrettes per day. 09/28/2022 [place]  Substance and Sexual Activity   Alcohol use: Yes    Comment: occasional use. a few times a year.    Drug use: No   Sexual activity: Not on file  Other Topics Concern   Not on file  Social History Narrative   Pt lives with sister    Disability    Social Drivers of Health   Financial Resource Strain: Not on file  Food Insecurity: Not on file  Transportation Needs: Not on file  Physical Activity: Not on file  Stress: Not on file  Social Connections: Not on file    Allergies:  Allergies  Allergen Reactions   Clindamycin Anaphylaxis   Indomethacin Hives   Levofloxacin Hives   Penicillins Anaphylaxis   Penicillins Cross Reactors Anaphylaxis   Sulfa Antibiotics Swelling    Blister (steven john syndrome)   Levaquin [Levofloxacin Hemihydrate] Hives   Lipitor  [Atorvastatin Calcium]    Multihance  [Gadobenate] Hives    Pt having hives/ rash on neck and chest    Sulfamethoxazole-Trimethoprim    Zocor [Simvastatin] Other (See Comments)    Other reaction(s): MUSCLE PAIN Muscle aches    Current Medications: Current Outpatient Medications  Medication Sig Dispense Refill   albuterol  (PROVENTIL ) (2.5 MG/3ML) 0.083% nebulizer solution Take 3 mLs (2.5 mg total) by nebulization every 6 (six) hours as needed for  wheezing or shortness of breath. 75 mL 12   albuterol  (VENTOLIN  HFA) 108 (90 Base) MCG/ACT inhaler Inhale 2 puffs into the lungs every 6 (six) hours as needed for wheezing or shortness of breath. 8 g 6   aspirin EC 81 MG tablet Take 81 mg by mouth every other day.     buPROPion  (WELLBUTRIN  XL) 300 MG 24 hr tablet Take 1 tablet (300 mg total) by mouth in the morning. 30 tablet 2   Cladribine (MAVENCLAD, 9 TABS, PO) Take by mouth.  clonazePAM  (KLONOPIN ) 1 MG tablet Take 1 tablet (1 mg total) by mouth 2 (two) times daily. 60 tablet 5   cyclobenzaprine  (FLEXERIL ) 10 MG tablet Take 1 tablet (10 mg total) by mouth at bedtime. (Patient taking differently: Take 10 mg by mouth as needed.) 90 tablet 3   doxycycline  (VIBRA -TABS) 100 MG tablet Take 1 tablet (100 mg total) by mouth 2 (two) times daily. 14 tablet 0   DULoxetine  (CYMBALTA ) 30 MG capsule Take 1 capsule (30 mg total) by mouth daily for 7 days, THEN 2 capsules (60 mg total) daily for 23 days. 53 capsule 1   ezetimibe (ZETIA) 10 MG tablet Take 10 mg by mouth daily.      Fluticasone-Umeclidin-Vilant (TRELEGY ELLIPTA ) 200-62.5-25 MCG/ACT AEPB Inhale 1 puff into the lungs daily.     gabapentin  (NEURONTIN ) 300 MG capsule TAKE 1 CAPSULE BY MOUTH THREE TIMES DAILY 270 capsule 0   ipratropium (ATROVENT) 0.03 % nasal spray Place 2 sprays into both nostrils daily at 2 PM.     metFORMIN (GLUCOPHAGE-XR) 500 MG 24 hr tablet Take 2 tablets by mouth 2 (two) times daily with a meal. 1 tablet in morning and 2 tablets at night     MYRBETRIQ  50 MG TB24 tablet Take 1 tablet by mouth once daily 90 tablet 0   pantoprazole (PROTONIX) 40 MG tablet Take 40 mg by mouth every morning.     phentermine  37.5 MG capsule Take 1 capsule (37.5 mg total) by mouth every morning. 90 capsule 1   Spacer/Aero-Holding Chambers (EQ SPACE CHAMBER ANTI-STATIC) DEVI USE AS DIRECTED WITH  INHALERS 1 each 0   traZODone  (DESYREL ) 100 MG tablet Take 1 tablet (100 mg total) by mouth at bedtime.  30 tablet 2   varenicline  (CHANTIX ) 1 MG tablet Take 1 tablet by mouth twice daily (Patient taking differently: Take 1 mg by mouth daily.) 60 tablet 0   No current facility-administered medications for this visit.     Musculoskeletal: Strength & Muscle Tone: within normal limits Gait & Station: normal Patient leans: N/A  Psychiatric Specialty Exam: Review of Systems  There were no vitals taken for this visit.There is no height or weight on file to calculate BMI.  General Appearance: Fairly Groomed  Eye Contact:  Good  Speech:  Clear and Coherent  Volume:  Normal  Mood:  Euthymic  Affect:  Appropriate  Thought Process:  Coherent  Orientation:  Full (Time, Place, and Person)  Thought Content: Logical   Suicidal Thoughts:  No  Homicidal Thoughts:  No  Memory:  Immediate;   Fair  Judgement:  Good  Insight:  Fair  Psychomotor Activity:  Normal  Concentration:  Concentration: Fair  Recall:  Fair  Fund of Knowledge: Good  Language: Good  Akathisia:  NA    AIMS (if indicated): not done  Assets:  Communication Skills Desire for Improvement Financial Resources/Insurance Housing Transportation  ADL's:  Intact  Cognition: WNL  Sleep:  Fair   Metabolic Disorder Labs: No results found for: "HGBA1C", "MPG" No results found for: "PROLACTIN" No results found for: "CHOL", "TRIG", "HDL", "CHOLHDL", "VLDL", "LDLCALC" Lab Results  Component Value Date   TSH 5.340 (H) 08/21/2023   TSH 3.080 10/26/2022    Therapeutic Level Labs: No results found for: "LITHIUM" No results found for: "VALPROATE" No results found for: "CBMZ"   Screenings: Mini-Mental    Flowsheet Row Office Visit from 08/21/2023 in [facility]  Total Score (max 30 points ) 20  Collaboration of Care: Collaboration of Care: Medication Management AEB medication prescription and Other provider involved in patient's care AEB neurology and pulmonology chart  review  Patient/Guardian was advised Release of Information must be obtained prior to any record release in order to collaborate their care with an outside provider. Patient/Guardian was advised if they have not already done so to contact the registration department to sign all necessary forms in order for us  to release information regarding their care.   Consent: Patient/Guardian gives verbal consent for treatment and assignment of benefits for services provided during this visit. Patient/Guardian expressed understanding and agreed to proceed.    Yves Herb, MD 10/30/2023, 9:27 AM

## 2023-10-31 ENCOUNTER — Ambulatory Visit (HOSPITAL_COMMUNITY): Admitting: Psychiatry

## 2023-10-31 VITALS — BP 137/83 | HR 94 | Ht 64.0 in | Wt 195.0 lb

## 2023-10-31 DIAGNOSIS — F332 Major depressive disorder, recurrent severe without psychotic features: Secondary | ICD-10-CM

## 2023-10-31 DIAGNOSIS — E119 Type 2 diabetes mellitus without complications: Secondary | ICD-10-CM | POA: Diagnosis not present

## 2023-10-31 DIAGNOSIS — H5203 Hypermetropia, bilateral: Secondary | ICD-10-CM | POA: Diagnosis not present

## 2023-10-31 DIAGNOSIS — G4721 Circadian rhythm sleep disorder, delayed sleep phase type: Secondary | ICD-10-CM | POA: Diagnosis not present

## 2023-10-31 MED ORDER — TRAZODONE HCL 100 MG PO TABS
150.0000 mg | ORAL_TABLET | Freq: Every day | ORAL | 2 refills | Status: DC
Start: 2023-10-31 — End: 2023-12-19

## 2023-10-31 MED ORDER — DULOXETINE HCL 60 MG PO CPEP: 60.0000 mg | ORAL_CAPSULE | Freq: Every day | ORAL | 2 refills | Status: DC

## 2023-10-31 MED ORDER — BUPROPION HCL ER (XL) 300 MG PO TB24: 300.0000 mg | ORAL_TABLET | Freq: Every morning | ORAL | 2 refills | Status: DC

## 2023-10-31 MED ORDER — BUSPIRONE HCL 30 MG PO TABS
30.0000 mg | ORAL_TABLET | Freq: Two times a day (BID) | ORAL | 2 refills | Status: DC
Start: 2023-10-31 — End: 2023-12-19

## 2023-11-01 ENCOUNTER — Encounter: Payer: Self-pay | Admitting: Neurology

## 2023-11-01 ENCOUNTER — Encounter (HOSPITAL_COMMUNITY): Payer: Self-pay | Admitting: Psychiatry

## 2023-11-02 ENCOUNTER — Telehealth: Payer: Self-pay | Admitting: *Deleted

## 2023-11-02 NOTE — Telephone Encounter (Signed)
 Copied from CRM [id number]. Topic: Clinical - Medical Advice >> Nov 01, 2023  3:21 PM Helen Sandoval wrote: Reason for CRM: Patient returning phone call from [place]. Patient states she has 2 samples of Trelegy, has no problems but more coughing in the afternoon and late at night,  states she does not need the prescription for the Albuterol .   Called the pt and there was no answer-LMTCB.

## 2023-11-03 ENCOUNTER — Other Ambulatory Visit: Payer: Self-pay | Admitting: Nurse Practitioner

## 2023-11-03 MED ORDER — TRELEGY ELLIPTA 200-62.5-25 MCG/ACT IN AEPB: 1.0000 | INHALATION_SPRAY | Freq: Every day | RESPIRATORY_TRACT | 11 refills | Status: DC

## 2023-11-03 NOTE — Telephone Encounter (Signed)
 [facility] [facility] [facility] [address].   I called and spoke to pt. Pt informed of Beth's note and verbalized understanding. Pt likes the Trelegy and agreed on the RX for this. Pt states she still has her dry/ wet cough. When she puts her BIPAP on, the cough stops. She is not coughing up any mucous and this is usually at night. Pt states she has a bottle of Delsym that she will take with her on her vacation. I informed pt I would send in the RX for Trelegy and advise Beth on the cough to see if she recommends anything.

## 2023-11-03 NOTE — Addendum Note (Signed)
 Addended by: Jianni Batten T on: 11/03/2023 01:31 PM   Modules accepted: Orders

## 2023-11-07 ENCOUNTER — Other Ambulatory Visit: Payer: Self-pay | Admitting: Neurology

## 2023-11-07 ENCOUNTER — Other Ambulatory Visit: Payer: Self-pay

## 2023-11-07 DIAGNOSIS — F17210 Nicotine dependence, cigarettes, uncomplicated: Secondary | ICD-10-CM

## 2023-11-07 DIAGNOSIS — Z122 Encounter for screening for malignant neoplasm of respiratory organs: Secondary | ICD-10-CM

## 2023-11-07 DIAGNOSIS — Z87891 Personal history of nicotine dependence: Secondary | ICD-10-CM

## 2023-11-07 NOTE — Telephone Encounter (Signed)
 Last seen on 10/24/23  Follow up scheduled on 05/20/24

## 2023-11-08 NOTE — Telephone Encounter (Signed)
 A rx was already sent in per other telephone encounter. The cough was sent to Ms Sueanne Emerald as well to advise on in the other encounter. NFN

## 2023-11-11 ENCOUNTER — Other Ambulatory Visit: Payer: Self-pay | Admitting: Neurology

## 2023-11-12 ENCOUNTER — Other Ambulatory Visit (HOSPITAL_COMMUNITY): Payer: Self-pay | Admitting: Psychiatry

## 2023-11-12 DIAGNOSIS — F332 Major depressive disorder, recurrent severe without psychotic features: Secondary | ICD-10-CM

## 2023-11-13 ENCOUNTER — Other Ambulatory Visit: Payer: Self-pay | Admitting: Neurology

## 2023-11-13 MED ORDER — BELSOMRA 15 MG PO TABS: ORAL_TABLET | ORAL | 3 refills | Status: DC

## 2023-11-13 NOTE — Telephone Encounter (Signed)
 Last seen on 10/24/23  Follow up scheduled on 05/20/24

## 2023-11-15 NOTE — Telephone Encounter (Signed)
 Pharmacy is needing Dx Code for this medication

## 2023-11-17 ENCOUNTER — Other Ambulatory Visit: Payer: Self-pay

## 2023-11-17 MED ORDER — ALBUTEROL SULFATE HFA 108 (90 BASE) MCG/ACT IN AERS: 2.0000 | INHALATION_SPRAY | Freq: Four times a day (QID) | RESPIRATORY_TRACT | 6 refills | Status: AC | PRN

## 2023-11-21 ENCOUNTER — Ambulatory Visit (INDEPENDENT_AMBULATORY_CARE_PROVIDER_SITE_OTHER): Admitting: Licensed Clinical Social Worker

## 2023-11-21 DIAGNOSIS — F331 Major depressive disorder, recurrent, moderate: Secondary | ICD-10-CM

## 2023-11-23 ENCOUNTER — Ambulatory Visit: Admitting: Nurse Practitioner

## 2023-11-25 ENCOUNTER — Encounter (HOSPITAL_COMMUNITY): Payer: Self-pay | Admitting: Licensed Clinical Social Worker
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
## 2023-11-27 NOTE — Progress Notes (Signed)
 NEUROPSYCHOLOGICAL EVALUATION [place]  Physical Medicine and Rehabilitation     Patient: Helen Sandoval  MRN: [id number] DOB: June 18, 1960  Age: 63 y.o. Sex: female  Race/Ethnicity: White or Caucasian  Years of Education: 16 Handedness: Right  Referring Provider: Charlie DELENA Crete, MD  Provider/Clinical Neuropsychologist: Evalene DOROTHA Riff, PsyD  Date of Service: 10/18/23 Start Time: 10 AM End Time: 11 AM  Location of Service:  [facility] Physical Medicine & Rehabilitation Department [place] [address], [place] [place], [place] [address] Phone: [phone]  Billing Code/Service: [phone]  Individuals Present: Evalene Riff, PsyD 1 hour was spent on interpretation of patient data, interpretation of standardized test results and clinical data, clinical decision making, initial treatment planning/recommendations, and report writing. The report will be amended as needed based on any additional information collected during interactive feedback session.    REASON FOR REFERRAL The patient was referred for neuropsychological evaluation by her neurologist, Dr. Crete, due to concerns for memory loss. The patient's medical history, per records, includes MDD, relapsing remitting MS (diagnosed 2015), T2DM, OSA, essential tremor, and chronic pain. Per records from her 08/21/23 neurology visit, the the patient had a worsening of short-term memory following the death of her mother in 05/07/23. The patient scored a 20/30 on the MMSE. In his note, Dr. Crete states We discussed that memory problems could be multifactorial. We know that she has several medical conditions that might play a role in reduced focus/attention and subsequently memory including anxiety/depression, multiple sclerosis and sleep apnea. Although statistically, Alzheimer's disease is rare at her age it can occur and this needs to be considered. The patient underwent lab work  including ATN profile. Records indicated A high pTau181 concentration was observed. A normal beta-amyloid 42/40 ratio and normal concentration of NfL were observed at this time. These results are not consistent with the presence of Alzheimer's-related pathology, but may indicate pathology of another condition. Per records from her most recent visit, the patient's MS appears to be stable.  Upon interview, the patient indicated she hoped to better understand the likely cause of her cognitive symptoms and what might be done to improve them.   HISTORY OF PRESENTING CONCERNS Cognitive Symptom Onset & Course: Upon interview, the patient reported gradual onset of cognitive symptoms and a progressive symptom course.  The patient indicated she noticed problems in memory around five years ago, problems with attention within the last few years, and problems with processing speed within the last several months. She reported that her cognitive symptoms have persisted despite improvements in her mood over the last few months. Memory: The patient reported problems with frequently forgetting conversations, recent events misplacing things and remembering her medications.  She indicated that she is largely able to manage her medical appointments/future obligations via planner without difficulty. Processing Speed: The patient reported slowed processing speed. Attention & Concentration: The patient reported declines in attention and concentration. She described difficulties with task completion due to distractibility (e.g., starting new tasks before completing the current one, and failing to return). She indicated that her ability to multitask has declined more generally. Language: The patient reported frequent word-finding difficulties.  She denied having any paraphasic errors pointed out to her by others.  She denied any difficulties with receptive language in terms of comprehension aside from problems with reduced  perception.  She denied problems with reading or writing when controlling for attention. She reported having greater difficulty with mental arithmetic however.  Visual-Spatial: The patient denied significant visual-spatial problems but  reported 2 instances in which she got lost heading to familiar places.  Executive Functioning: The patient reported struggling with indecision, problem solving, planning, and organization.  She denied having any significant behavioral or personality changes outside of the context of a depressive episode.   Collateral Report: The patient agreed to let the writer contact her sister for collateral report.  They lived together.  Her sister indicated that she noticed some cognitive changes since diagnosed with MS but that the most significant change occurred in the context of significant worsening of depression when their mother died.  She began providing increased assistance with instrumental activities of daily living around that time.  She noted some modest improvement over time.  Her sister indicated that she noticed changes in terms of processing speed, attention, and short-term memory.  No significant changes were reported with respect to impulsivity/other behavioral changes.  Motor/Sensory Complaints: The patient denied any problems or significant changes with her sense of smell or taste.  She reported that she has had progressive declines in her vision and hearing.  She indicated that she wears corrective lenses and is planning to have a hearing test.  The patient's reported difficulties with poor balance and changes in gait dating back several years which align with clinic notes reviewing MS symptom history. The patient also endorsed tremor. She has a family history of tremor in her mother.     Emotional and Behavioral Functioning:  The patient denied any current or past difficulties with symptoms of anxiety (frequent/difficult to control worry, trouble relaxing,  feeling tense, recurring symptoms of panic, etc.), although indicated that she may be more easily overwhelmed. The patient reported a history of recurrent episodes of depression.  She stated the first onset was approximately 9 to 12 years ago.  The patient reported that she is not currently experiencing significant symptoms of depression (e.g., low mood, anhedonia, significant decreases in activity).  She stated that her mood is currently good, and she denied frequent feelings of low mood or decreased enjoyment from activities.  She indicated that she noticed improvements after they began weaning her off Effexor  in March of this year. She continues to grieve the loss of her mother, with whom she was living. The patient denied any current suicidal ideation, but has had suicidal ideation in the past. She cited her family as a major protective factor in this respect.  The patient reported that she began to experience visual hallucinations starting approximately five to six years ago which she suspects are related to her medication. She reported that the hallucinations are purely visual, and involve somewhat unclear silhouettes of figures or (predominantly) animals approaching her. There was one exception to this involving two women standing on a hill. These occur exclusively in the context of dark environments, predominantly outdoors at night. She indicated she will carry a flashlight and shine it towards the figure to confirm that it is not real. She denied any history of trauma and the figures are not specific people or animals she has previously encountered. She stated that this last occurred around one year ago. She denied noticing any association between her mental state / mood and the hallucinations. The patient denied any current or past paranoia, symptoms of mania, or homicidal ideation.  Per medical records the patient is seen by psychiatry, establishing care in January 2025.  She met criteria for major  depressive disorder and delayed sleep phase syndrome at the initial psychiatric  evaluation.  Per a 08/22/2023 psychiatry note, the patient had some improvement upon medication changes, noting increased activity levels and energy.  Psychiatry notes the patient's concern about the potential of dementia relating to her memory concerns.  Per psychiatry notes, potential contributing factors include patient's depression, MS, and ongoing benzodiazepine use.  Sleep: The patient reported long running difficulties with sleep onset.  She indicated that she usually sleeps about 5 hours per her BiPAP report.  She indicated that she feels tired and fatigued most of the time.  She denied any RBD's. Appetite: No significant/unaccounted for changes. Caffeine: Minimal. Alcohol Use: Infrequent.  Denied problematic use history. Tobacco Use: 3 to 4 cigarettes/day for approximately 40 years.  Quit smoking around 1 year ago, supported by [facility] . Recreational Substance Use: Denied.  Level of Functional Independence: The patient is independent with basic activities of daily living.  Finances: Independent and without issue. Shopping / Meal Preparation: Independent and without issue (orders groceries online/has delivered).  Household Maintenance / Chores: Independent and without issue. Tracking Appointments / Future Obligations: Medication Management: Sister assists with organizing the weekly pillbox starting around a month ago.  The patient is able to remember to take her medications independently. Driving: 2 instances of struggling to navigate to 2 different doctors offices which she has traveled to multiple times in the past.  No other difficulties reported with respect to driving.  Medical History/Record Review: Patient was diagnosed with MS around 2015, but suspects that there were signs in 2008.  The patient endorsed chronic pain (back) which is a 9 out of 10 and present all the time.  She denied any history of  seizures or cancer or TBI.  She indicated that she is working with providers on managing her cholesterol and blood pressure.  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   Tobacco abuse 12/05/2022   Thrombocytopenia (HCC) 08/18/2020   Vitamin D  deficiency 08/12/2020   Bronchitis, chronic (HCC) 05/28/2018   Benign essential tremor 11/09/2017   High risk medication use 11/09/2017   Multiple sclerosis (HCC) 04/10/2015   Right hip pain 04/10/2015   Degeneration of lumbar or lumbosacral intervertebral disc 04/10/2015   Gait abnormality 01/06/2015   Other fatigue 01/06/2015   Mixed incontinence 05/08/2014   Urge incontinence 03/28/2014   MDD (major depressive disorder), recurrent episode, severe (HCC) 01/13/2014   Diverticulitis of sigmoid colon 10/24/2013   DS (disseminated sclerosis) (HCC) 08/26/2013   Myelitis (HCC) 03/25/2013   DDD (degenerative disc disease), cervical 01/13/2011   Bone/cartilage disorder 01/13/2011   Genital herpes 01/13/2011   HLD (hyperlipidemia) 01/13/2011   OSA treated with BiPAP 01/13/2011   Degeneration of intervertebral disc of cervical region 01/13/2011   Dysthymia 01/13/2011    Imaging/Lab Results: Per medical record (Neurology, 08/21/23 visit) MRI brain 05/14/2020 shows T2/FLAIR hyperintense foci in the hemispheres, pons and left basal ganglia.  Most of the foci are nonspecific.  A few are periventricular and radially oriented.  The  focus in the left basal ganglia could also represent ischemic sequela.    None of the foci appear to be acute and  they do not enhance.    There are no new lesions compared with 05/12/2018 MRI.   MRI of the cervical spine shows 1 or 2 small subtle foci within the spinal cord adjacent to C4-C5 and C6.  These have been seen on previous MRIs.  Though nonspecific, this is consistent with a chronic demyelinating plaque.  Minimal chronic compressive myelopathic signal cannot be ruled out.  There are no new lesions and no enhancing lesions.   Prior C4-C6 ACDF.  There is no nerve root compression or spinal stenosis at these levels.   Mild spinal stenosis and mild foraminal narrowing at C3-C4 and C6-C7 that does not lead to any nerve root compression.  Degenerative changes are stable compared to the 2020 MRI.   MRI of the brain 05/19/2021 showed no new lesions.  MRI of the cervical spine 05/19/2021 showed no new lesions.   MRI of the brain and cervical spine 11/2022 showed no new MS lesions.  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 There is moderate facet hypertrophy at L4-L5 with moderately severe facet hypertrophy at L5-S1.  Family Neurologic/Medical Hx:  Family History  Problem Relation Age of Onset   Bladder Cancer Mother    Colon cancer Mother    Colon cancer Father        diagnosed less than age of 32.    Bladder Cancer Maternal Grandmother    Leukemia Maternal Grandmother    Alzheimer's disease Neg Hx    Medications:  aspirin EC 81 MG tablet buPROPion  (WELLBUTRIN  XL) 300 MG 24 hr tablet busPIRone  (BUSPAR ) 30 MG tablet clonazePAM  (KLONOPIN ) 0.5 MG tablet cyclobenzaprine  (FLEXERIL ) 10 MG tablet ezetimibe (ZETIA) 10 MG tablet gabapentin  (NEURONTIN ) 300 MG capsule ipratropium (ATROVENT) 0.03 % nasal spray Melatonin 10 MG TABS metFORMIN (GLUCOPHAGE-XR) 500 MG 24 hr tablet MYRBETRIQ  50 MG TB24 tablet pantoprazole (PROTONIX) 40 MG  tablet phentermine  37.5 MG capsule tirzepatide (MOUNJARO) 7.5 MG/0.5ML Pen traZODone  (DESYREL ) 50 MG tablet varenicline  (CHANTIX ) 1 MG tablet venlafaxine  XR (EFFEXOR  XR) 150 MG 24 hr capsule   Educational/Vocational History: Denied any significant learning difficulties growing up in school.  Denied any significant problems with attention and concentration.  Earned a bachelor's degree, estimated GPA was within the 3.0-4.0 range.  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  Worked for a cardiologist for about 25 years.  Stopped working in when on disability due to MS around 2015-2016.  Psychosocial: Marital Status: 1 prior marriage of 20 years, divorced. Children/Grandchildren: None. Living Situation: Lives alone, but her residence is connected to that of her sister and sister's husband. Daily Activities/Hobbies: The patient struggled to identify any activities she does for enjoyment.  Neuropsychodiagnostic Findings:   Behavioral Observations: The patient was oriented to self, place, and time. Her hearing and vision were adequate for testing. She ambulated independently and without issue. No hand tremor was noted during testing. Her speech was prosodic, fluent, and well-articulated. She displayed no clear indications of word-finding difficulties in conversational speech and no paraphasic errors were noted. Receptive language appeared intact. The patient's affect was congruent with mood and her mood was largely neutral to positive.  The patient was alert and participated in testing as instructed, though she experienced some mild difficulties with understanding testing instructions at times. She was cooperative throughout the session. Her pace was steady. She showed no difficulties with frustration tolerance. The patient's social interactions were unremarkable and consistent with the setting. The patient reported having a headache during testing  and stated that she was recovering from being sick. No frank attentional lapses were appreciated.  Tests Administered: Automatic Data Edition (BNT-2) Brief Visuospatial Memory Test-Revised (BVMT-R) Benton Judgment of Line Orientation (JOLO) Clock Drawing Test Controlled Oral Word Association Test (FAS & Animals) Orthoptist System (D-KEFS), select subtests Hopkins Verbal Learning Test - Revised (HVLT-R) Rey Complex Figure Test (RCFT), select subtests Symbol Digit Modalities Test (SDMT), oral version Trail Making Test (TMT; Part A & B) Wechsler Adult Intelligence Scale-Fourth Edition (WAIS-IV), select subtests Wechsler Memory Scale-Fourth Edition (WMS-IV) , select subtests Wechsler Test of Adult Reading (WTAR) [place]  Card Sorting Test [phone]) Geriatric Depression Scale-Short Form (GDS-SF) Geriatric Anxiety Inventory (GAI) 1 stand-alone and multiple embedded performance validity metrics Test scores are relative to age, gender, and educational history as available and appropriate.  Measurement properties of test scores: IQ, Index, and Standard Scores (SS): Mean = 100; Standard Deviation = 15; Scaled Scores (ss): Mean = 10; Standard Deviation = 3; Z scores (Z): Mean = 0; Standard Deviation = 1; T scores (T); Mean = 50; Standard Deviation = 10  Results:   Performance validity: The patient scored within normal limits on a stand-alone PVT and on multiple embedded PVTs. There are no significant concerns for sub-optimal engagement overall. However, during he feedback appointment the patient reported that she had not been feeling well during testing and that she was feeling unwell, and that she became increasingly ill. There is some concern for the possible impact of health difficulties on testing, which is discussed in the summary and impressions section of this report.   Intellectual/Premorbid Functioning Estimate   Norm Score Percentile  Range  Wechsler Test  of Adult Reading  SS = 107 68 %ile Average  The patient is estimated to be within the average range in premorbid cognitive abilities based on her performance on a word reading/recognition task and her professional history. ATTENTION AND WORKING MEMORY    Norm Score Percentile  Range  WAIS-IV          Digit Span  ss = 11 63 %ile Average   DSF  ss = 10 50 %ile Average   Span:    6      DSB  ss = 14 91 %ile Above Average   Span:    7      DSS  ss = 8 25 %ile Average   Span:    6     Overall performance on a basic auditory-verbal attention measure was average by age.  She scored average with respect to basic span but showed considerable variability on working memory.  Specifically, her performance was in the above average range with a task requiring her to repeat digits in reverse order (relative to presentation) but over two standard deviations lower and within the average range on the subtest requiring her to restate digits in order of value (least to greatest). PROCESSING SPEED    Norm Score Percentile  Range  SDMT (Oral)  SD = -2 2 %ile Below Average   Processing speed as measured by a verbal symbol digit translation task was scored in the below average range by age.  LANGUAGE    Norm Score Percentile  Range  Boston Naming Test (BNT-2)  t = 44 27 %ile Average  COWAT          FAS  t = 39 13 %ile Low Average   Animals  t = 31 3 %ile Below Average  Confrontation naming/word retrieval was scored in the average range by age and education.  She performed below expectations on verbal fluency tasks with a low average phonemic and below average semantic verbal fluency performance.  The score difference was not statistically significant.  EXECUTIVE FUNCTIONING    Norm Score Percentile Range  DKEFS - Color-Word Interference          Color Naming  ss = 3 1 %ile Exceptionally Low   Word Reading  ss = 9 37 %ile Average   Inhibition  ss = 5 5 %ile Below Average   Errors  ss = 2 0.4 %ile Exceptionally Low    Inhibition Switching  ss = 1 0.1 %ile Exceptionally Low   Errors  ss = 1 0.1 %ile Exceptionally Low  Trails A  t = 15 <0.1 %ile Exceptionally Low   Errors    0     Trails B  t = 39 13 %ile Low Average   Errors    2     [place]  Card Sorting Test (WCST)          Total Errors  t = 34 5 %ile Below Average   Perseverative Errors  t = 45 30 %ile Average   Non-Perseverative Errors  t = 23 0.3 %ile Exceptionally Low   % Conceptual Responses  t = 33 5 %ile Below Average   Categories Completed     11 to 16 %ile Low Average   Trials to First Category Complete     6 to 10 %ile Low to Below Average   Failure to Maintain Set     11 to 16 %ile Low Average     MEMORY    Norm Score Percentile  Range  BVMT-R          Trial 1  t = 44.0 27 %ile Average   Trial 2  t = 27 1 %ile Exceptionally Low   Trial 3  t = <20 0.1 %ile Exceptionally Low   Total Recall  t = 24.0 0.5 %ile Exceptionally Low   Learning  t = <30 < 2 %ile Exceptionally Low   Delayed Recall  t = 25.0 1 %ile Exceptionally Low   % Retained    100 >16 %ile WNL   Hits     >16 %ile WNL   False Alarms     >16 %ile WNL   Recognition Discriminability     >16 %ile WNL  HVLT          Total Recall  t = 24.0 0.5 %ile Exceptionally Low   Delayed Recall  t = <20 0.1 %ile Exceptionally Low   %Retention  t = <20 0.1 %ile Exceptionally Low   Recognition Discriminability  t = 31.0 3 %ile Below Average  Wechsler Memory Scale, 4th Edition (WMS-4)         Log. Mem. Immediate Recall  ss = 6 9 %ile Low Average   Logical Memory Delayed Recall  ss = 6 9 %ile Low Average   Logical Recognition    17th-25th  %ile Average to Low Average   VISUAL-SPATIAL    Norm Score Percentile  Range  Benton JOLO  ss = 7 16 %ile Low Average  Rey Complex Figure Copy       <=1 %ile Exceptionally Low  Clock          PERSONALITY AND BEHAVIORAL FUNCTIONING      Score/Interpretation  GDS-SF Raw       11  GDS-SF Severity       Severe.  GAI Raw       12  GAI Severity        Elevated   SUMMARY / CLINICAL IMPRESSIONS The patient was referred for neuropsychological evaluation by her neurologist, Dr. Vear, due to concerns for memory loss. The patient's medical history, per records, includes MDD, relapsing remitting MS (diagnosed 2015), T2DM, OSA, essential tremor, and chronic pain. Per records from her 08/21/23 neurology visit, the the patient had a worsening of short-term memory following the death of her mother in 04/12/2023. The patient scored a 20/30 on the MMSE. In his note, Dr. Vear states We discussed that memory problems could be multifactorial. We know that she has several medical conditions that might play a role in reduced focus/attention and subsequently memory including anxiety/depression, multiple sclerosis and sleep apnea. Although statistically, Alzheimer's disease is rare at her age it can occur and this needs to be considered. The patient underwent lab work including ATN profile. Records indicated A high pTau181 concentration was observed. A normal beta-amyloid 42/40 ratio and normal concentration of NfL were observed at this time. These results are not consistent with the presence of Alzheimer's-related pathology, but may indicate pathology of another condition. Per records from her most recent visit, the patient's MS appears to be stable. Upon interview, the patient indicated she hoped to better understand the likely cause of her cognitive symptoms and what might be done to improve them.  The patient reported memory difficulties which have been present for at least 5 years (estimated) and more recent changes involving processing speed and attention/concentration.  She has a significant psychiatric history but reported no significant improvement cognitive symptoms alongside improvements in mood.  The patient is largely intact with instrumental activities of daily living.   Diagnosis: Memory Loss   Recommendations:    This report was generated  using voice recognition software. While this document has been carefully reviewed, transcription errors may be present. I apologize in advance for any inconvenience. Please contact me if further clarification is needed.             Evalene DOROTHA Riff, PsyD             Neuropsychologist

## 2023-11-28 ENCOUNTER — Other Ambulatory Visit: Payer: Self-pay | Admitting: Nurse Practitioner

## 2023-11-28 ENCOUNTER — Ambulatory Visit: Payer: Medicare HMO | Admitting: Neurology

## 2023-11-29 NOTE — Progress Notes (Signed)
   NEUROPSYCHOLOGICAL EVALUATION [place]. [facility]  Physical Medicine and Rehabilitation     Patient: Helen Sandoval  MRN: [id number] DOB: 04-25-1961   Service Provider/Clinical Neuropsychologist: Evalene DOROTHA Riff, PsyD  Date of Service: 10/25/23 Start Time: 11 AM End Time: 12 PM  Location of Service:  [facility] Physical Medicine & Rehabilitation Department [place]. [facility] [address], [place] [place], [place] [address] Phone: [phone]   Billing Code/Service: [phone]    Individuals present: Patient, patient's sister, Provider Laurier DOROTHA Riff, PsyD)  Provider conducted the 60-minute interactive feedback appointment in-person with the patient, who was accompanied by her sister with permission.  The provider reviewed and discussed the results of neuropsychological evaluation. Follow-up interviewing was conducted as needed to refine interpretation of findings as needed. Review of results included overall findings, diagnosis, and treatment planning/recommendations that were derived from integration of patient data, interpretation of standardized rest results and clinical data, and clinical decision making, which are documented in the patient's electronic medical record with the full report (date listed below). A copy of the full report will also be mailed to the patient.   The patient expressed understanding of the information reviewed. The patient was provided opportunity to ask questions which were then answered by the provider. The provider worked collaboratively to tailor treatment recommendations to the patient when possible. The patient was informed they could reach out to the provider should additional questions related to the evaluation arise.    The final neuropsychological evaluation report, documented in the patient's chart on 10/18/23, was amended to reflect any additional information obtained during the feedback appointment including treatment  planning collaboration.    This report was generated using voice recognition software. While this document has been carefully reviewed, transcription errors may be present. I apologize in advance for any inconvenience. Please contact me if further clarification is needed.             Evalene DOROTHA Riff, PsyD             Neuropsychologist

## 2023-12-04 ENCOUNTER — Other Ambulatory Visit: Payer: Self-pay | Admitting: Neurology

## 2023-12-04 NOTE — Telephone Encounter (Signed)
 Last seen on 08/21/23 Follow up scheduled 05/20/24   Dispensed Days Supply Quantity Provider Pharmacy  PHENTERMINE  37.5MG   CAP 10/08/2023 30 30 each Sater, Charlie LABOR, MD [facility]...      Rx pending to be signed

## 2023-12-05 ENCOUNTER — Ambulatory Visit (HOSPITAL_COMMUNITY): Admitting: Psychiatry

## 2023-12-05 DIAGNOSIS — F321 Major depressive disorder, single episode, moderate: Secondary | ICD-10-CM | POA: Diagnosis not present

## 2023-12-05 DIAGNOSIS — D696 Thrombocytopenia, unspecified: Secondary | ICD-10-CM | POA: Diagnosis not present

## 2023-12-05 DIAGNOSIS — G729 Myopathy, unspecified: Secondary | ICD-10-CM | POA: Diagnosis not present

## 2023-12-05 DIAGNOSIS — N182 Chronic kidney disease, stage 2 (mild): Secondary | ICD-10-CM | POA: Diagnosis not present

## 2023-12-05 DIAGNOSIS — E782 Mixed hyperlipidemia: Secondary | ICD-10-CM | POA: Diagnosis not present

## 2023-12-05 DIAGNOSIS — E038 Other specified hypothyroidism: Secondary | ICD-10-CM | POA: Diagnosis not present

## 2023-12-05 DIAGNOSIS — E1122 Type 2 diabetes mellitus with diabetic chronic kidney disease: Secondary | ICD-10-CM | POA: Diagnosis not present

## 2023-12-05 DIAGNOSIS — G35 Multiple sclerosis: Secondary | ICD-10-CM | POA: Diagnosis not present

## 2023-12-05 DIAGNOSIS — G720 Drug-induced myopathy: Secondary | ICD-10-CM | POA: Diagnosis not present

## 2023-12-11 DIAGNOSIS — E1122 Type 2 diabetes mellitus with diabetic chronic kidney disease: Secondary | ICD-10-CM | POA: Diagnosis not present

## 2023-12-11 DIAGNOSIS — G35 Multiple sclerosis: Secondary | ICD-10-CM | POA: Diagnosis not present

## 2023-12-11 DIAGNOSIS — N182 Chronic kidney disease, stage 2 (mild): Secondary | ICD-10-CM | POA: Diagnosis not present

## 2023-12-11 DIAGNOSIS — D696 Thrombocytopenia, unspecified: Secondary | ICD-10-CM | POA: Diagnosis not present

## 2023-12-18 NOTE — Progress Notes (Unsigned)
 BH MD/PA/NP OP Progress Note  12/19/2023 5:01 PM Helen Sandoval  MRN:  [id number]  Visit Diagnosis:    ICD-10-CM   1. Severe episode of recurrent major depressive disorder, without psychotic features (HCC)  F33.2 DULoxetine  (CYMBALTA ) 60 MG capsule    DULoxetine  (CYMBALTA ) 30 MG capsule    buPROPion  (WELLBUTRIN  XL) 300 MG 24 hr tablet    busPIRone  (BUSPAR ) 30 MG tablet       Assessment: Helen Sandoval is a 63 y.o. female with a history of MDD, relapsing remitting multiple sclerosis (diagnosed 2015), T2DM, OSA, essential tremor, chronic low back pain, and Vitamin D  deficiency who presented to [facility] at [facility] for initial evaluation on 07/25/2023.    At initial evaluation patient reported symptoms of depression including low mood, anhedonia, amotivation, decreased tearfulness, feelings of hopelessness, and passive SI. She consistently denied active SI and there are no acute safety concerns at this time.  Crisis resources were reviewed.  Notably patient's mother who had been significant support in her life passed in November 2024.  While patient denied depressive symptoms worsening at that time she had endorsed otherwise at visit with another provider.  Symptoms progressed past what would be consistent with typical grief response.  Notably patient does have a diagnosis of MS was made in 2015 and it is around this time that her depressive symptoms first onset.  She has undergone numerous medication trials since with periods of improvement followed by loss of benefit from medication.  Patient met criteria for MDD and late sleep phase syndrome on initial evaluation.  Helen Sandoval presents for follow-up evaluation. Today, 12/19/23, patient reports    a couple intermittent depressive episodes in the interim.  These are typically related to increased isolation and present with anhedonia, fatigue, and excessive tearfulness.  She denies any SI or thoughts of self-harm.   Memory concerns remain consistent and she was diagnosed with mild cognitive impairment per neuropsych testing.  Insomnia also continues to be an issue.  This is likely multifactorial and patient was recently started on new BiPAP mask which seems to be going better.  Neurology could consider a trial of either Dayvigo or Belsomra  presuming they feel discontinuation of Klonopin  would be appropriate.  If Klonopin  was discontinued should monitor for any change in tremor symptoms.  Patient tolerated the cross titration off of venlafaxine  on duloxetine  without issue.  Given that depressive symptoms are secondary to the increased isolation would continue on current dose at this time.  We will titrate trazodone  150 mg and follow up in a month.  Psychotherapeutic interventions were used during today's session. From 4:05 PM to 4:28 PM. Therapeutic interventions included empathic listening, supportive therapy, cognitive and behavioral therapy, motivational interviewing. Used supportive interviewing techniques to provide emotional validation. Worked on cognitive reframing techniques and unhelpful thoughts challenged as appropriate. Alternative thoughts developed with guidance. Reviewed some techniques to facilitate increased behavioral activation. Improvement was evidenced by patient's participation and identified commitment to therapy goals.   Plan: Bubble Pack all medications (unable to do a current pharmacy patient considering switching or getting medication dispensing machine) - Increase Duloxetine  to 90 mg daily  - Continue Buspar  30 mg BID - Continue Wellbutrin  XL 300 mg daily - Continue Gabapentin  300 mg BID prescribed by neuro - Discontinued Trazodone  when she started Belsomra  - Neurology discontinued Klonpon and started Belsomra   - With insomnia could consider Dayvigo  - Phentermine  37.5 mg daily managed by neurology - R/o contributing medical conditions:  CBC, TSH, CMP, Vit D grossly wnl 10/26/2022;  -  Therapy appointment with Harlene on 5/27 - Information for MS support groups provided - Sleep study completed, already on BIPAP - Crisis resources reviewed - Follow up in a month  Chief Complaint:  Chief Complaint  Patient presents with   Follow-up   HPI: Helen Sandoval presents alongside her sister who participated in the session with patient's approval.  Helen Sandoval reports that she feels that her overall state of mine has been better since starting on the Cymbalta . She has been getting out more doing things like church and vacation bible school.  She denies any negatives frim the medication but dies winder if it can be a bit more helpful.   Sleep is still an issue. 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   the past month she has had a couple episodes of depression.  These tend to occur when she is isolated which she has found happening a little bit more frequently as her doctor's appointment frequency has decreased.  Patient reports becoming tearful, feeling fatigued, and anhedonic during the episodes.  She denies any thoughts of self-harm or suicide.  We discussed behavioral activation and getting out to do things as opposed to staying in the house on her own.  Patient has been looking into a prayer group with a bunch of other ladies which she thinks she would enjoy.  Her sister's granddaughter is also in the group which patient thinks will be nice.  Patient still reports that insomnia is initiated with no significant improvement with the titration of trazodone .  Her BiPAP mask was changed around a week ago which has helped though there was 1 night where she ripped it off unknowingly and she ended up sleeping until around noon.   She is unclear when the patient reports she did accidentally take her morning meds at night 1 day.  If this was the same day that could explain why she was more restless during the night since she is supposed to take phentermine  and Wellbutrin  in the morning.  Memory issues are still a concern).  Patient did complete the neuropsych testing was completed and she reports that there was a dx of a mild cognitive impairment.  The report is still pending and they were told that it could take 2 weeks to be added to the EMR.  As patient's insomnia continues to be an issue we we will trial titrating trazodone  150 mg and reviewed the risk and benefits.  We also explained that the insomnia benefit does start to plateau as we have the higher doses in the medication begins to have more serotonergic or antidepressant effects.  Still would recommend considering Belsomra  or Dayvigo if patient could discuss with her neurologist in place of the Klonopin .  If this was done would have to monitor for tremors to see if there is any increase.  Past Psychiatric History: Diagnoses: MDD Medication trials: Zoloft  (lost effectiveness); Prozac (ineffective); Cymbalta  (appears to have been brief trial); Trazodone  (loss of effect over time), Viibryd (couldn't get insurance to cover); Xanax ; Adderall (inattention, lost effect over time), venlafaxine  (some benefit but there was a plateau with residual depressive symptoms) Previous psychiatrist/therapist: Dr. Mercy for an initial eval in January  of 2025 Hospitalizations: denies Suicide attempts: denies SIB: denies Hx of violence towards others: denies   Substance use:  -- Etoh: 1 glass wine every few months  -- Tobacco: quit 11/20/22; on Chantix   -- Cannabis: denies  -- Illicit drugs: denies  -- Caffeine: 1 soda daily; denies use of coffee, tea, energy drinks  Past Medical History:  Past Medical History:  Diagnosis Date   Ankle fracture    Depression    Diabetes (HCC)     Fatty liver    Osteopenia    Osteoporosis    Scarlet fever    Shingles    Sleep apnea    Spina bifida    Splenomegaly    Thrombocytopenia (HCC)    Vitamin D  deficiency     Past Surgical History:  Procedure Laterality Date   ABDOMINAL HYSTERECTOMY     fibroids and heavy bleeding   BREAST BIOPSY Right    20+ yrs ago   CERVICAL FUSION     Family History:  Family History  Problem Relation Age of Onset   Bladder Cancer Mother    Colon cancer Mother    Colon cancer Father        diagnosed less than age of 33.    Bladder Cancer Maternal Grandmother    Leukemia Maternal Grandmother    Alzheimer's disease Neg Hx     Social History:  Social History   Socioeconomic History   Marital status: Divorced    Spouse name: Not on file   Number of children: Not on file   Years of education: Not on file   Highest education level: Not on file  Occupational History   Not on file  Tobacco Use   Smoking status: Former    Current packs/day: 0.00    Types: Cigarettes    Quit date: 11/21/2022    Years since quitting: 1.0   Smokeless tobacco: Never   Tobacco comments:    1 cigarrettes per day. 09/28/2022 [place]  Substance and Sexual Activity   Alcohol use: Yes    Comment: occasional use. a few times a year.    Drug use: No   Sexual activity: Not on file  Other Topics Concern   Not on file  Social History Narrative   Pt lives with sister    Disability    Social Drivers of Health   Financial Resource Strain: Not on file  Food Insecurity: Not on file  Transportation Needs: Not on file  Physical Activity: Not on file  Stress: Not on file  Social Connections: Not on file    Allergies:  Allergies  Allergen Reactions   Clindamycin Anaphylaxis   Indomethacin Hives   Levofloxacin Hives   Penicillins Anaphylaxis   Penicillins Cross Reactors Anaphylaxis   Sulfa Antibiotics Swelling    Blister (steven john syndrome)   Levaquin [Levofloxacin Hemihydrate] Hives   Lipitor   [Atorvastatin Calcium]    Multihance  [Gadobenate] Hives    Pt having hives/ rash on neck and chest    Sulfamethoxazole-Trimethoprim    Zocor [Simvastatin] Other (See Comments)    Other reaction(s): MUSCLE PAIN Muscle aches    Current Medications: Current Outpatient Medications  Medication Sig Dispense Refill   albuterol  (PROVENTIL ) (2.5 MG/3ML) 0.083% nebulizer solution Take 3 mLs (2.5 mg total) by nebulization every 6 (six) hours as needed for wheezing or shortness of breath. 75 mL 12   albuterol  (VENTOLIN  HFA) 108 (90 Base) MCG/ACT inhaler Inhale 2 puffs into the lungs every 6 (six) hours  as needed for wheezing or shortness of breath. 8 g 6   aspirin EC 81 MG tablet Take 81 mg by mouth every other day.     BELSOMRA  15 MG TABS TAKE 1 TABLET BY MOUTH EVERY DAY AT BEDTIME 30 tablet 3   Cladribine (MAVENCLAD, 9 TABS, PO) Take by mouth.     cyclobenzaprine  (FLEXERIL ) 10 MG tablet Take 1 tablet (10 mg total) by mouth at bedtime. 90 tablet 3   doxycycline  (VIBRA -TABS) 100 MG tablet Take 1 tablet (100 mg total) by mouth 2 (two) times daily. 14 tablet 0   DULoxetine  (CYMBALTA ) 30 MG capsule Take 1 capsule (30 mg total) by mouth daily. Take with 60 mg tab for a total of 90 mgs a day 30 capsule 2   ezetimibe (ZETIA) 10 MG tablet Take 10 mg by mouth daily.      Fluticasone-Umeclidin-Vilant (TRELEGY ELLIPTA ) 200-62.5-25 MCG/ACT AEPB Inhale 1 puff into the lungs daily.     Fluticasone-Umeclidin-Vilant (TRELEGY ELLIPTA ) 200-62.5-25 MCG/ACT AEPB Inhale 1 puff into the lungs daily. 60 each 11   gabapentin  (NEURONTIN ) 300 MG capsule TAKE 1 CAPSULE BY MOUTH THREE TIMES DAILY 270 capsule 1   ipratropium (ATROVENT) 0.03 % nasal spray Place 2 sprays into both nostrils daily at 2 PM.     metFORMIN (GLUCOPHAGE-XR) 500 MG 24 hr tablet Take 2 tablets by mouth 2 (two) times daily with a meal. 1 tablet in morning and 2 tablets at night     MOUNJARO 10 MG/0.5ML Pen 10 mg Subcutaneous once a week; Duration: 90 days      MYRBETRIQ  50 MG TB24 tablet Take 1 tablet by mouth once daily 90 tablet 1   pantoprazole (PROTONIX) 40 MG tablet Take 40 mg by mouth every morning.     phentermine  37.5 MG capsule Take 1 capsule by mouth in the morning 30 capsule 0   Spacer/Aero-Holding Chambers (EQ SPACE CHAMBER ANTI-STATIC) DEVI USE AS DIRECTED WITH  INHALERS 1 each 0   varenicline  (CHANTIX ) 1 MG tablet Take 1 tablet by mouth twice daily 60 tablet 0   buPROPion  (WELLBUTRIN  XL) 300 MG 24 hr tablet Take 1 tablet (300 mg total) by mouth in the morning. 30 tablet 2   busPIRone  (BUSPAR ) 30 MG tablet Take 1 tablet (30 mg total) by mouth 2 (two) times daily. 60 tablet 2   DULoxetine  (CYMBALTA ) 60 MG capsule Take 1 capsule (60 mg total) by mouth daily. Take with 30 mg tab for a total of 90 mgs a day 30 capsule 2   No current facility-administered medications for this visit.     Musculoskeletal: Strength & Muscle Tone: within normal limits Gait & Station: normal Patient leans: N/A  Psychiatric Specialty Exam: Review of Systems  Blood pressure (!) 149/87, pulse 96, height 5' 4 (1.626 m), weight 192 lb (87.1 kg).Body mass index is 32.96 kg/m.  General Appearance: Fairly Groomed  Eye Contact:  Good  Speech:  Clear and Coherent  Volume:  Normal  Mood:  Euthymic  Affect:  Appropriate  Thought Process:  Coherent  Orientation:  Full (Time, Place, and Person)  Thought Content: Logical   Suicidal Thoughts:  No  Homicidal Thoughts:  No  Memory:  Immediate;   Fair  Judgement:  Good  Insight:  Fair  Psychomotor Activity:  Normal  Concentration:  Concentration: Fair  Recall:  Fair  Fund of Knowledge: Good  Language: Good  Akathisia:  NA    AIMS (if indicated): not done  Assets:  Communication  Skills Desire for Improvement Financial Resources/Insurance Housing Transportation  ADL's:  Intact  Cognition: WNL  Sleep:  Fair   Metabolic Disorder Labs: No results found for: HGBA1C, MPG No results found for:  PROLACTIN No results found for: CHOL, TRIG, HDL, CHOLHDL, VLDL, LDLCALC Lab Results  Component Value Date   TSH 5.340 (H) 08/21/2023   TSH 3.080 10/26/2022    Therapeutic Level Labs: No results found for: LITHIUM No results found for: VALPROATE No results found for: CBMZ   Screenings: GAD-7    Flowsheet Row Counselor from 11/21/2023 in [facility]  Total GAD-7 Score 5   Mini-Mental    Flowsheet Row Office Visit from 08/21/2023 in [facility]  Total Score (max 30 points ) 20   PHQ2-9    Flowsheet Row Counselor from 11/21/2023 in [facility] at [facility] Total Score 1   Flowsheet Row Counselor from 11/21/2023 in [facility] at [facility]  C-SSRS RISK CATEGORY Low Risk    Collaboration of Care: Collaboration of Care: Medication Management AEB medication prescription and Other provider involved in patient's care AEB neurology, therapy, and pulmonology chart review  Patient/Guardian was advised Release of Information must be obtained prior to any record release in order to collaborate their care with an outside provider. Patient/Guardian was advised if they have not already done so to contact the registration department to sign all necessary forms in order for us  to release information regarding their care.   Consent: Patient/Guardian gives verbal consent for treatment and assignment of benefits for services provided during this visit. Patient/Guardian expressed understanding and agreed to proceed.    Arvella CHRISTELLA Finder, MD 12/19/2023, 5:01 PM

## 2023-12-19 ENCOUNTER — Ambulatory Visit (HOSPITAL_COMMUNITY): Admitting: Psychiatry

## 2023-12-19 ENCOUNTER — Other Ambulatory Visit: Payer: Self-pay

## 2023-12-19 ENCOUNTER — Encounter (HOSPITAL_COMMUNITY): Payer: Self-pay | Admitting: Psychiatry

## 2023-12-19 VITALS — BP 149/87 | HR 96 | Ht 64.0 in | Wt 192.0 lb

## 2023-12-19 DIAGNOSIS — G4721 Circadian rhythm sleep disorder, delayed sleep phase type: Secondary | ICD-10-CM

## 2023-12-19 DIAGNOSIS — F331 Major depressive disorder, recurrent, moderate: Secondary | ICD-10-CM

## 2023-12-19 DIAGNOSIS — F332 Major depressive disorder, recurrent severe without psychotic features: Secondary | ICD-10-CM | POA: Diagnosis not present

## 2023-12-19 MED ORDER — BUSPIRONE HCL 30 MG PO TABS: 30.0000 mg | ORAL_TABLET | Freq: Two times a day (BID) | ORAL | 2 refills | Status: DC

## 2023-12-19 MED ORDER — DULOXETINE HCL 60 MG PO CPEP
60.0000 mg | ORAL_CAPSULE | Freq: Every day | ORAL | 2 refills | Status: DC
Start: 2023-12-19 — End: 2024-01-30

## 2023-12-19 MED ORDER — DULOXETINE HCL 30 MG PO CPEP: 30.0000 mg | ORAL_CAPSULE | Freq: Every day | ORAL | 2 refills | Status: DC

## 2023-12-19 MED ORDER — BUPROPION HCL ER (XL) 300 MG PO TB24: 300.0000 mg | ORAL_TABLET | Freq: Every morning | ORAL | 2 refills | Status: DC

## 2023-12-20 ENCOUNTER — Encounter (HOSPITAL_COMMUNITY): Payer: Self-pay | Admitting: Psychiatry

## 2023-12-24 ENCOUNTER — Other Ambulatory Visit: Payer: Self-pay | Admitting: Nurse Practitioner

## 2023-12-25 NOTE — Telephone Encounter (Signed)
 Please verify pt is doing well with Chantix . If she is, ok to refill. Thanks.

## 2023-12-27 ENCOUNTER — Ambulatory Visit (INDEPENDENT_AMBULATORY_CARE_PROVIDER_SITE_OTHER): Admitting: Licensed Clinical Social Worker

## 2023-12-27 DIAGNOSIS — F331 Major depressive disorder, recurrent, moderate: Secondary | ICD-10-CM | POA: Diagnosis not present

## 2023-12-28 ENCOUNTER — Encounter (HOSPITAL_COMMUNITY): Payer: Self-pay

## 2023-12-28 NOTE — Progress Notes (Signed)
 Virtual Visit via Video Note  I connected with Helen Sandoval on 12/27/23 at  2:30 PM EDT by a video enabled telemedicine application and verified that I am speaking with the correct person using two identifiers.  Location: Patient: Home Provider: Home office   I discussed the limitations of evaluation and management by telemedicine and the availability of in person appointments. The patient expressed understanding and agreed to proceed.   I discussed the assessment and treatment plan with the patient. The patient was provided an opportunity to ask questions and all were answered. The patient agreed with the plan and demonstrated an understanding of the instructions.   The patient was advised to call back or seek an in-person evaluation if the symptoms worsen or if the condition fails to improve as anticipated.  I provided 45 minutes of non-face-to-face time during this encounter.   [name], LCSW   THERAPIST PROGRESS NOTE  Session Time: 2:30pm-3:15pm  Participation Level: Active  Behavioral Response: Well GroomedAlertDepressed and Euthymic  Type of Therapy: Individual Therapy  Treatment Goals addressed:  Problem: OP Depression     Dates: Start:  12/28/23       Disciplines: Interdisciplinary, PROVIDER        Goal: LTG: Reduce frequency, intensity, and duration of depression symptoms so that daily functioning is improved     Dates: Start:  12/28/23    Expected End:  06/28/24       Disciplines: Interdisciplinary, PROVIDER             Goal: LTG: Increase coping skills to manage depression and improve ability to perform daily activities     Dates: Start:  12/28/23    Expected End:  06/28/24       Disciplines: Interdisciplinary, PROVIDER         ProgressTowards Goals: Initial  Interventions: CBT  Summary: Helen Sandoval is a 63 y.o. female who presents with MDD, recurrent, moderate.   Suicidal/Homicidal: Nowithout intent/plan  Therapist Response: Shakti Fleer  engaged well in individual virtual session with clinician. Clinician utilized CBT to process thoughts, feelings, and interactions. Clinician explored depressed mood and noted that loneliness is a significant factor, as well as challenges with reality of MS dx. Clinician processed current status, noting pain, muscle tightness, and memory issues. Clinician also noted challenges with sleep. Clinician utilized CBT psychoeducation to address sleep problems. Clinician shared videos specifically from [facility] regarding deep breathing, PMR, and guided imagery for people who have MS and other physical disabilities.    Plan: Return again in 2 weeks.  Diagnosis: MDD (major depressive disorder), recurrent episode, moderate (HCC)  Collaboration of Care: Psychiatrist AEB reviewed notes from Dr. Carvin. Noted improvement with meds from Dr. Carvin  Patient/Guardian was advised Release of Information must be obtained prior to any record release in order to collaborate their care with an outside provider. Patient/Guardian was advised if they have not already done so to contact the registration department to sign all necessary forms in order for us  to release information regarding their care.   Consent: Patient/Guardian gives verbal consent for treatment and assignment of benefits for services provided during this visit. Patient/Guardian expressed understanding and agreed to proceed.   [name], LCSW 12/28/2023

## 2023-12-30 ENCOUNTER — Encounter (HOSPITAL_COMMUNITY): Payer: Self-pay | Admitting: Licensed Clinical Social Worker

## 2024-01-06 ENCOUNTER — Other Ambulatory Visit: Payer: Self-pay | Admitting: Neurology

## 2024-01-08 NOTE — Telephone Encounter (Signed)
 Last seen on 10/24/23 Follow up scheduled on 05/20/24   Dispensed Days Supply Quantity Provider Pharmacy  PHENTERMINE  37.5MG   CAP 12/04/2023 30 30 each Sater, Charlie LABOR, MD [facility]...    Rx pending to be signed

## 2024-01-10 ENCOUNTER — Ambulatory Visit (HOSPITAL_COMMUNITY): Admitting: Licensed Clinical Social Worker

## 2024-01-10 DIAGNOSIS — F330 Major depressive disorder, recurrent, mild: Secondary | ICD-10-CM

## 2024-01-11 ENCOUNTER — Encounter (HOSPITAL_COMMUNITY): Payer: Self-pay | Admitting: Licensed Clinical Social Worker

## 2024-01-11 NOTE — Progress Notes (Signed)
 Virtual Visit via Video Note  I connected with [name] on 01/11/24 at  4:30 PM EDT by a video enabled telemedicine application and verified that I am speaking with the correct person using two identifiers.  Location: Patient: home Provider: home office   I discussed the limitations of evaluation and management by telemedicine and the availability of in person appointments. The patient expressed understanding and agreed to proceed.   I discussed the assessment and treatment plan with the patient. The patient was provided an opportunity to ask questions and all were answered. The patient agreed with the plan and demonstrated an understanding of the instructions.   The patient was advised to call back or seek an in-person evaluation if the symptoms worsen or if the condition fails to improve as anticipated.  I provided 40 minutes of non-face-to-face time during this encounter.   [name], LCSW   THERAPIST PROGRESS NOTE  Session Time: 4:30pm-5:10pm  Participation Level: Active  Behavioral Response: Well GroomedAlertEuthymic  Type of Therapy: Individual Therapy  Treatment Goals addressed:  Problem: OP Depression      Dates: Start:  12/28/23        Disciplines: Interdisciplinary, PROVIDER            Goal: LTG: Reduce frequency, intensity, and duration of depression symptoms so that daily functioning is improved     Dates: Start:  12/28/23    Expected End:  06/28/24       Disciplines: Interdisciplinary, PROVIDER                Goal: LTG: Increase coping skills to manage depression and improve ability to perform daily activities     Dates: Start:  12/28/23    Expected End:  06/28/24       Disciplines: Interdisciplinary, PROVIDER         ProgressTowards Goals: Progressing  Interventions: CBT  Summary: Kayte Borchard is a 63 y.o. female who presents with MDD, recurrent, mild.   Suicidal/Homicidal: Nowithout intent/plan  Therapist Response: Hennie Gosa  engaged well in individual virtual session with clinician. Clinician utilized CBT to process thoughts, feelings, and interactions. Clinician explored mood and noted improvement with medication from Dr. Carvin. Clinician discussed socialization and identified increased involvement in church and building new friendships with a few women in her study group. Clinician reflected increased joy and ease in her voice. Clinician explored anxiety and sleep issues. Ronal Jenkins shared she has been using the progressive muscle relaxation discussed at last session and this has been very helpful in relaxing to go to sleep.   Plan: Return again in 2 weeks.  Diagnosis: MDD (major depressive disorder), recurrent episode, mild (HCC)  Collaboration of Care: Patient refused AEB none required  Patient/Guardian was advised Release of Information must be obtained prior to any record release in order to collaborate their care with an outside provider. Patient/Guardian was advised if they have not already done so to contact the registration department to sign all necessary forms in order for us  to release information regarding their care.   Consent: Patient/Guardian gives verbal consent for treatment and assignment of benefits for services provided during this visit. Patient/Guardian expressed understanding and agreed to proceed.   Harlene JONELLE Lambert, LCSW 01/11/2024

## 2024-01-23 ENCOUNTER — Ambulatory Visit (HOSPITAL_COMMUNITY): Admitting: Licensed Clinical Social Worker

## 2024-01-23 ENCOUNTER — Encounter (HOSPITAL_COMMUNITY): Payer: Self-pay | Admitting: Licensed Clinical Social Worker

## 2024-01-23 DIAGNOSIS — F330 Major depressive disorder, recurrent, mild: Secondary | ICD-10-CM

## 2024-01-23 NOTE — Progress Notes (Signed)
   THERAPIST PROGRESS NOTE  Session Time: 1:30pm-2:15pm  Participation Level: Active  Behavioral Response: Well GroomedAlertEuthymic  Type of Therapy: Individual Therapy  Treatment Goals addressed:  Problem: OP Depression        Dates: Start:  12/28/23          Disciplines: Interdisciplinary, PROVIDER              Goal: LTG: Reduce frequency, intensity, and duration of depression symptoms so that daily functioning is improved     Dates: Start:  12/28/23    Expected End:  06/28/24       Disciplines: Interdisciplinary, PROVIDER                Goal: LTG: Increase coping skills to manage depression and improve ability to perform daily activities     Dates: Start:  12/28/23    Expected End:  06/28/24       Disciplines: Interdisciplinary, PROVIDER         ProgressTowards Goals: Progressing  Interventions: Motivational Interviewing  Summary: Helen Sandoval is a 63 y.o. female who presents with MDD, recurrent, mild.   Suicidal/Homicidal: Nowithout intent/plan  Therapist Response: Helen Sandoval engaged well in individual in person session with clinician. Clinician utilized MI OARS to reflect and summarize thoughts, feelings, and interactions. Clinician explored updates in health and medications. Helen Sandoval shared she is feeling much better, more energy, more level mood, and increased energy and motivation to complete tasks. Clinician reflected the increase in socialization and involvement through her church, which has also been quite supportive and enjoyable. Clinician discussed memory challenges and identified different tools available to keep track of things, such as writing things down, making notes in her phone, and repeating things verbally. Helen Sandoval also shared she will be getting hearing aids next week, which will hopefully help with her cognitive skills and her memory, as well as her hearing.   Plan: Return again in 2 weeks.  Diagnosis: MDD (major depressive disorder), recurrent  episode, mild (HCC)  Collaboration of Care: Psychiatrist AEB provided brief update to Dr. Carvin about Helen Sandoval's progress  Patient/Guardian was advised Release of Information must be obtained prior to any record release in order to collaborate their care with an outside provider. Patient/Guardian was advised if they have not already done so to contact the registration department to sign all necessary forms in order for us  to release information regarding their care.   Consent: Patient/Guardian gives verbal consent for treatment and assignment of benefits for services provided during this visit. Patient/Guardian expressed understanding and agreed to proceed.   Helen Sandoval [place], LCSW 01/23/2024

## 2024-01-29 NOTE — Progress Notes (Unsigned)
 BH MD/PA/NP OP Progress Note  01/30/2024 4:27 PM Helen Sandoval  MRN:  [id number]  Visit Diagnosis:    ICD-10-CM   1. 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 MDD (major depressive disorder), recurrent episode, moderate (HCC)  F33.1 busPIRone  (BUSPAR ) 30 MG tablet    DULoxetine  (CYMBALTA ) 60 MG capsule    DULoxetine  (CYMBALTA ) 30 MG capsule    buPROPion  (WELLBUTRIN  XL) 300 MG 24 hr tablet      Assessment: Helen Sandoval is a 63 y.o. female with a history of MDD, relapsing remitting multiple sclerosis (diagnosed 2015), T2DM, OSA, essential tremor, chronic low back pain, and Vitamin D  deficiency who presented to [facility] at [facility] for initial evaluation on 07/25/2023.    At initial evaluation patient reported symptoms of depression including low mood, anhedonia, amotivation, decreased tearfulness, feelings of hopelessness, and passive SI. She consistently denied active SI and there are no acute safety concerns at this time.  Crisis resources were reviewed.  Notably patient's mother who had been significant support in her life passed in November 2024.  While patient denied depressive symptoms worsening at that time she had endorsed otherwise at visit with another provider.  Symptoms progressed past what would be consistent with typical grief response.  Notably patient does have a diagnosis of MS was made in 2015 and it is around this time that her depressive symptoms first onset.  She has undergone numerous medication trials since with periods of improvement followed by loss of benefit from medication.  Patient met criteria for MDD and late sleep phase syndrome on initial evaluation.  Helen Sandoval presents for follow-up evaluation. Today, 01/30/24, patient reports improved mood symptoms and memory.  She has tolerated the Cymbalta  titration well denying any adverse side effects.  Patient has been taking the Belsomra  for the past 2 months  with limited improvement in insomnia.  Will discontinue Belsomra  today and start on Dayvigo .  Risk and benefits reviewed.  If no improvement on this medication recommended patient focus on CBT for insomnia.  Continue remainder of current regimen and follow-up in 2 months.  Psychotherapeutic interventions were used during today's session. From 3:37 PM to 3:53 PM. Therapeutic interventions included cognitive and behavioral therapy, motivational interviewing. Worked on cognitive reframing techniques and unhelpful thoughts challenged as appropriate. Alternative thoughts developed with guidance. Reviewed some techniques to facilitate increased behavioral activation.  Focused specifically on sleep hygiene and some strategies from CBT for insomnia.  Improvement was evidenced by patient's participation and identified commitment to therapy goals.   Plan: Bubble Pack all medications (unable to do a current pharmacy patient considering switching or getting medication dispensing machine) - Continue Duloxetine  90 mg daily  - Continue Buspar  30 mg BID - Continue Wellbutrin  XL 300 mg daily - Started Dayvigo  5 mg nightly - Continue Gabapentin  300 mg BID prescribed by neuro - Discontinued Belsomra  due to lack of benefit - Discontinued Phentermine  37.5 mg due to lack of benefit - R/o contributing medical conditions: CBC, TSH, CMP, Vit D grossly wnl 10/26/2022;  - Continue therapy with Harlene - Information for MS support groups provided - Sleep study completed, already on BIPAP - Crisis resources reviewed - Follow up in 2 months  Chief Complaint:  Chief Complaint  Patient presents with   Follow-up   HPI: Helen Sandoval presents alongside her sister who participated in the session with patient's approval.  Helen Sandoval reports that she excellent and has done much better  since making these medication adjustments.  The most recent titration of duloxetine  to 90 mg a seem to get her back to her old self.  She is found herself  going out a lot more to do things such as church, prayer groups, movies, and babysitting her grandkids.  She also is finding enjoyment in things and noticed that her memory has slightly improved.  Sleep is still an issue.  Overall sleep can fluctuate.  She takes the Belsomra  around 6 PM with dinner and goes to bed a few hours later.  It can often take her 3 hours to fall asleep and then she will sleep for roughly 5 to 6 hours until 8 AM.  Patient had been taking Belsomra  later in the evening but seemed to notice no difference with taking it 30 minutes before bed versus taking it with dinner.  We discussed sleep symptoms and CBT for insomnia.  Reviewed that this would be the most effective way to manage that and recommended that patient get out of bed when awake for more than 15 minutes.  She was interested in trying Dayvigo  before fully committing to CBT for insomnia.  Risk and benefits of this medication were reviewed.  Past Psychiatric History: Diagnoses: MDD Medication trials: Zoloft  (lost effectiveness); Prozac (ineffective); Cymbalta  (appears to have been brief trial); Trazodone  (loss of effect over time), Belsomra  (ineffective), Klonopin  (beneficial for sleep but discontinued due to memory concerns and dysregulation of sleep architecture), Viibryd (couldn't get insurance to cover); Xanax ; Adderall (inattention, lost effect over time), venlafaxine  (some benefit but there was a plateau with residual depressive symptoms) Previous psychiatrist/therapist: Dr. Mercy for an initial eval in January of 2025 Hospitalizations: denies Suicide attempts: denies SIB: denies Hx of violence towards others: denies   Substance use:  -- Etoh: 1 glass wine every few months  -- Tobacco: quit 11/20/22; on Chantix   -- Cannabis: denies  -- Illicit drugs: denies  -- Caffeine: 1 soda daily; denies use of coffee, tea, energy drinks  Past Medical History:  Past Medical History:  Diagnosis Date   Ankle fracture     Depression    Diabetes (HCC)    Fatty liver    Osteopenia    Osteoporosis    Scarlet fever    Shingles    Sleep apnea    Spina bifida    Splenomegaly    Thrombocytopenia (HCC)    Vitamin D  deficiency     Past Surgical History:  Procedure Laterality Date   ABDOMINAL HYSTERECTOMY     fibroids and heavy bleeding   BREAST BIOPSY Right    20+ yrs ago   CERVICAL FUSION     Family History:  Family History  Problem Relation Age of Onset   Bladder Cancer Mother    Colon cancer Mother    Colon cancer Father        diagnosed less than age of 83.    Bladder Cancer Maternal Grandmother    Leukemia Maternal Grandmother    Alzheimer's disease Neg Hx     Social History:  Social History   Socioeconomic History   Marital status: Divorced    Spouse name: Not on file   Number of children: Not on file   Years of education: Not on file   Highest education level: Not on file  Occupational History   Not on file  Tobacco Use   Smoking status: Former    Current packs/day: 0.00    Types: Cigarettes    Quit date: 11/21/2022  Years since quitting: 1.1   Smokeless tobacco: Never   Tobacco comments:    1 cigarrettes per day. 09/28/2022 [place]  Substance and Sexual Activity   Alcohol use: Yes    Comment: occasional use. a few times a year.    Drug use: No   Sexual activity: Not on file  Other Topics Concern   Not on file  Social History Narrative   Pt lives with sister    Disability    Social Drivers of Health   Financial Resource Strain: Not on file  Food Insecurity: Not on file  Transportation Needs: Not on file  Physical Activity: Not on file  Stress: Not on file  Social Connections: Not on file    Allergies:  Allergies  Allergen Reactions   Clindamycin Anaphylaxis   Indomethacin Hives   Levofloxacin Hives   Penicillins Anaphylaxis   Penicillins Cross Reactors Anaphylaxis   Sulfa Antibiotics Swelling    Blister (steven john syndrome)   Levaquin [Levofloxacin  Hemihydrate] Hives   Lipitor  [Atorvastatin Calcium]    Multihance  [Gadobenate] Hives    Pt having hives/ rash on neck and chest    Sulfamethoxazole-Trimethoprim    Zocor [Simvastatin] Other (See Comments)    Other reaction(s): MUSCLE PAIN Muscle aches    Current Medications: Current Outpatient Medications  Medication Sig Dispense Refill   albuterol  (PROVENTIL ) (2.5 MG/3ML) 0.083% nebulizer solution Take 3 mLs (2.5 mg total) by nebulization every 6 (six) hours as needed for wheezing or shortness of breath. 75 mL 12   albuterol  (VENTOLIN  HFA) 108 (90 Base) MCG/ACT inhaler Inhale 2 puffs into the lungs every 6 (six) hours as needed for wheezing or shortness of breath. 8 g 6   aspirin EC 81 MG tablet Take 81 mg by mouth every other day.     cyclobenzaprine  (FLEXERIL ) 10 MG tablet Take 1 tablet (10 mg total) by mouth at bedtime. 90 tablet 3   ezetimibe (ZETIA) 10 MG tablet Take 10 mg by mouth daily.      Fluticasone-Umeclidin-Vilant (TRELEGY ELLIPTA ) 200-62.5-25 MCG/ACT AEPB Inhale 1 puff into the lungs daily.     gabapentin  (NEURONTIN ) 300 MG capsule TAKE 1 CAPSULE BY MOUTH THREE TIMES DAILY 270 capsule 1   ipratropium (ATROVENT) 0.03 % nasal spray Place 2 sprays into both nostrils daily at 2 PM.     Lemborexant  (DAYVIGO ) 5 MG TABS Take 1 tablet (5 mg total) by mouth at bedtime. 30 tablet 2   metFORMIN (GLUCOPHAGE-XR) 500 MG 24 hr tablet Take 2 tablets by mouth 2 (two) times daily with a meal. 1 tablet in morning and 2 tablets at night     MOUNJARO 10 MG/0.5ML Pen 10 mg Subcutaneous once a week; Duration: 90 days     MYRBETRIQ  50 MG TB24 tablet Take 1 tablet by mouth once daily 90 tablet 1   pantoprazole (PROTONIX) 40 MG tablet Take 40 mg by mouth every morning.     Spacer/Aero-Holding Chambers (EQ SPACE CHAMBER ANTI-STATIC) DEVI USE AS DIRECTED WITH  INHALERS 1 each 0   buPROPion  (WELLBUTRIN  XL) 300 MG 24 hr tablet Take 1 tablet (300 mg total) by mouth in the morning. 30 tablet 2    busPIRone  (BUSPAR ) 30 MG tablet Take 1 tablet (30 mg total) by mouth 2 (two) times daily. 60 tablet 2   Cladribine (MAVENCLAD, 9 TABS, PO) Take by mouth. (Patient not taking: Reported on 01/30/2024)     doxycycline  (VIBRA -TABS) 100 MG tablet Take 1 tablet (100 mg total) by  mouth 2 (two) times daily. (Patient not taking: Reported on 01/30/2024) 14 tablet 0   DULoxetine  (CYMBALTA ) 30 MG capsule Take 1 capsule (30 mg total) by mouth daily. Take with 60 mg tab for a total of 90 mgs a day 30 capsule 2   DULoxetine  (CYMBALTA ) 60 MG capsule Take 1 capsule (60 mg total) by mouth daily. Take with 30 mg tab for a total of 90 mgs a day 30 capsule 2   Fluticasone-Umeclidin-Vilant (TRELEGY ELLIPTA ) 200-62.5-25 MCG/ACT AEPB Inhale 1 puff into the lungs daily. 60 each 11   varenicline  (CHANTIX ) 1 MG tablet Take 1 tablet by mouth twice daily 60 tablet 0   No current facility-administered medications for this visit.     Musculoskeletal: Strength & Muscle Tone: within normal limits Gait & Station: normal Patient leans: N/A  Psychiatric Specialty Exam: Review of Systems  Blood pressure 111/72, pulse 98, height 5' 4 (1.626 m), weight 187 lb (84.8 kg).Body mass index is 32.1 kg/m.  General Appearance: Fairly Groomed  Eye Contact:  Good  Speech:  Clear and Coherent  Volume:  Normal  Mood:  Euthymic  Affect:  Appropriate  Thought Process:  Coherent  Orientation:  Full (Time, Place, and Person)  Thought Content: Logical   Suicidal Thoughts:  No  Homicidal Thoughts:  No  Memory:  Immediate;   Fair  Judgement:  Good  Insight:  Fair  Psychomotor Activity:  Normal  Concentration:  Concentration: Fair  Recall:  Fair  Fund of Knowledge: Good  Language: Good  Akathisia:  NA    AIMS (if indicated): not done  Assets:  Communication Skills Desire for Improvement Financial Resources/Insurance Housing Transportation  ADL's:  Intact  Cognition: WNL  Sleep:  Fair   Metabolic Disorder Labs: No results  found for: HGBA1C, MPG No results found for: PROLACTIN No results found for: CHOL, TRIG, HDL, CHOLHDL, VLDL, LDLCALC Lab Results  Component Value Date   TSH 5.340 (H) 08/21/2023   TSH 3.080 10/26/2022    Therapeutic Level Labs: No results found for: LITHIUM No results found for: VALPROATE No results found for: CBMZ   Screenings: GAD-7    Flowsheet Row Counselor from 11/21/2023 in [facility] at [facility]  Total GAD-7 Score 5   Mini-Mental    Flowsheet Row Office Visit from 08/21/2023 in [facility]  Total Score (max 30 points ) 20   PHQ2-9    Flowsheet Row Counselor from 11/21/2023 in [facility] at [facility] Total Score 1   Flowsheet Row Counselor from 11/21/2023 in [facility]  C-SSRS RISK CATEGORY Low Risk    Collaboration of Care: Collaboration of Care: Medication Management AEB medication prescription and Other provider involved in patient's care AEB therapy, chart review  Patient/Guardian was advised Release of Information must be obtained prior to any record release in order to collaborate their care with an outside provider. Patient/Guardian was advised if they have not already done so to contact the registration department to sign all necessary forms in order for us  to release information regarding their care.   Consent: Patient/Guardian gives verbal consent for treatment and assignment of benefits for services provided during this visit. Patient/Guardian expressed understanding and agreed to proceed.    Arvella CHRISTELLA Finder, MD 01/30/2024, 4:27 PM

## 2024-01-30 ENCOUNTER — Encounter (HOSPITAL_COMMUNITY): Payer: Self-pay | Admitting: Psychiatry

## 2024-01-30 ENCOUNTER — Ambulatory Visit (HOSPITAL_COMMUNITY): Admitting: Psychiatry

## 2024-01-30 ENCOUNTER — Other Ambulatory Visit (HOSPITAL_COMMUNITY): Payer: Self-pay | Admitting: Psychiatry

## 2024-01-30 ENCOUNTER — Other Ambulatory Visit: Payer: Self-pay

## 2024-01-30 VITALS — BP 111/72 | HR 98 | Ht 64.0 in | Wt 187.0 lb

## 2024-01-30 DIAGNOSIS — F331 Major depressive disorder, recurrent, moderate: Secondary | ICD-10-CM | POA: Diagnosis not present

## 2024-01-30 DIAGNOSIS — G4721 Circadian rhythm sleep disorder, delayed sleep phase type: Secondary | ICD-10-CM

## 2024-01-30 MED ORDER — DAYVIGO 5 MG PO TABS: 5.0000 mg | ORAL_TABLET | Freq: Every day | ORAL | 2 refills | Status: DC

## 2024-01-30 MED ORDER — DULOXETINE HCL 30 MG PO CPEP: 30.0000 mg | ORAL_CAPSULE | Freq: Every day | ORAL | 2 refills | Status: DC

## 2024-01-30 MED ORDER — BUSPIRONE HCL 30 MG PO TABS: 30.0000 mg | ORAL_TABLET | Freq: Two times a day (BID) | ORAL | 2 refills | Status: DC

## 2024-01-30 MED ORDER — DULOXETINE HCL 60 MG PO CPEP: 60.0000 mg | ORAL_CAPSULE | Freq: Every day | ORAL | 2 refills | Status: DC

## 2024-01-30 MED ORDER — BUPROPION HCL ER (XL) 300 MG PO TB24: 300.0000 mg | ORAL_TABLET | Freq: Every morning | ORAL | 2 refills | Status: DC

## 2024-01-31 ENCOUNTER — Ambulatory Visit: Admitting: Pulmonary Disease

## 2024-01-31 DIAGNOSIS — J411 Mucopurulent chronic bronchitis: Secondary | ICD-10-CM

## 2024-01-31 DIAGNOSIS — J209 Acute bronchitis, unspecified: Secondary | ICD-10-CM

## 2024-01-31 DIAGNOSIS — M7061 Trochanteric bursitis, right hip: Secondary | ICD-10-CM | POA: Diagnosis not present

## 2024-02-02 ENCOUNTER — Other Ambulatory Visit (HOSPITAL_COMMUNITY): Payer: Self-pay | Admitting: Psychiatry

## 2024-02-05 ENCOUNTER — Telehealth (HOSPITAL_COMMUNITY): Payer: Self-pay | Admitting: *Deleted

## 2024-02-05 NOTE — Telephone Encounter (Signed)
 PA Appeal has been Approved by [facility] from 05/24/23 through 05/22/24.

## 2024-02-06 ENCOUNTER — Ambulatory Visit (HOSPITAL_COMMUNITY): Admitting: Licensed Clinical Social Worker

## 2024-02-06 ENCOUNTER — Telehealth (HOSPITAL_COMMUNITY): Payer: Self-pay | Admitting: *Deleted

## 2024-02-06 DIAGNOSIS — F330 Major depressive disorder, recurrent, mild: Secondary | ICD-10-CM | POA: Diagnosis not present

## 2024-02-06 NOTE — Telephone Encounter (Signed)
 In regards to fax from 02/05/24 stating PA for Dayvigo  was Dismissed. Writer reached out to [facility] to check status of PA Public relations account executive). [employer] CSR ran the pt and stated that PA is neither Approved or Denied. On clarification of what this means, it was stated that [facility] still reviewing information and would respond in 7 days.  Per pt pharmacy, [facility], this medication has been picked up and there was no issue with the PA.

## 2024-02-07 ENCOUNTER — Encounter (HOSPITAL_COMMUNITY): Payer: Self-pay | Admitting: Licensed Clinical Social Worker
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
## 2024-02-27 ENCOUNTER — Encounter (HOSPITAL_COMMUNITY): Payer: Self-pay | Admitting: Licensed Clinical Social Worker

## 2024-02-27 ENCOUNTER — Ambulatory Visit (HOSPITAL_COMMUNITY): Admitting: Licensed Clinical Social Worker

## 2024-02-27 DIAGNOSIS — F4321 Adjustment disorder with depressed mood: Secondary | ICD-10-CM | POA: Diagnosis not present

## 2024-02-27 DIAGNOSIS — F330 Major depressive disorder, recurrent, mild: Secondary | ICD-10-CM

## 2024-02-27 NOTE — Progress Notes (Signed)
   THERAPIST PROGRESS NOTE  Session Time: 3:30pm-4:25pm  Participation Level: Active  Behavioral Response: Well GroomedAlertEuthymic and tearful  Type of Therapy: Individual Therapy  Treatment Goals addressed:  Problem: OP Depression         Dates: Start:  12/28/23           Disciplines: Interdisciplinary, PROVIDER               Goal: LTG: Reduce frequency, intensity, and duration of depression symptoms so that daily functioning is improved     Dates: Start:  12/28/23    Expected End:  06/28/24       Disciplines: Interdisciplinary, PROVIDER                Goal: LTG: Increase coping skills to manage depression and improve ability to perform daily activities     Dates: Start:  12/28/23    Expected End:  06/28/24       Disciplines: Interdisciplinary, PROVIDER      ProgressTowards Goals: Progressing  Interventions: CBT  Summary: Helen Sandoval is a 63 y.o. female who presents with MDD, recurrent, mild, grief.   Suicidal/Homicidal: Nowithout intent/plan  Therapist Response: Kellye Mizner engaged well in individual in person session with clinician. Clinician utilized CBT to process thoughts, feelings, and itneractions. [name] shared that it will be the 1 year anniversary of her mother's death and this will be a very difficult week coming up. Clinician processed feelings of grief and the behaviors associated with grief. Clinician explored ways to communicate her grief and allow herself to emote appropriately without feeling weak. Clinician provided psychoeducation about the importance of allowing grief to come and go as it needs in order to move through it. Clinician discussed the value of sharing memories and stories about mom, remembering good and funny times, and reclaiming the day of her death as a way to commemorate her.   Plan: Return again in 2 weeks.  Diagnosis: MDD (major depressive disorder), recurrent episode, mild  Grief  Collaboration of Care: Psychiatrist AEB [name]  is doing quite well with mood stability.   Patient/Guardian was advised Release of Information must be obtained prior to any record release in order to collaborate their care with an outside provider. Patient/Guardian was advised if they have not already done so to contact the registration department to sign all necessary forms in order for us  to release information regarding their care.   Consent: Patient/Guardian gives verbal consent for treatment and assignment of benefits for services provided during this visit. Patient/Guardian expressed understanding and agreed to proceed.   Harlene SAUNDERS [place], LCSW 02/27/2024

## 2024-03-12 ENCOUNTER — Ambulatory Visit (HOSPITAL_COMMUNITY): Admitting: Licensed Clinical Social Worker

## 2024-03-12 DIAGNOSIS — F330 Major depressive disorder, recurrent, mild: Secondary | ICD-10-CM

## 2024-03-12 DIAGNOSIS — F4321 Adjustment disorder with depressed mood: Secondary | ICD-10-CM | POA: Diagnosis not present

## 2024-03-21 ENCOUNTER — Encounter (HOSPITAL_COMMUNITY): Payer: Self-pay | Admitting: Licensed Clinical Social Worker

## 2024-03-21 NOTE — Progress Notes (Signed)
   THERAPIST PROGRESS NOTE  Session Time: 1:30pm-2:15pm  Participation Level: Active  Behavioral Response: Well GroomedAlertEuthymic  Type of Therapy: Individual Therapy  Treatment Goals addressed:  Problem: OP Depression         Dates: Start:  12/28/23           Disciplines: Interdisciplinary, PROVIDER               Goal: LTG: Reduce frequency, intensity, and duration of depression symptoms so that daily functioning is improved     Dates: Start:  12/28/23    Expected End:  06/28/24       Disciplines: Interdisciplinary, PROVIDER                Goal: LTG: Increase coping skills to manage depression and improve ability to perform daily activities     Dates: Start:  12/28/23    Expected End:  06/28/24       Disciplines: Interdisciplinary, PROVIDER       ProgressTowards Goals: Progressing  Interventions: CBT  Summary: Helen Sandoval is a 63 y.o. female who presents with MDD, mild and grief.   Suicidal/Homicidal: Nowithout intent/plan  Therapist Response: Helen Sandoval engaged well in individual in person session with clinician. Clinician utilized CBT to process thoughts, feelings, and interactions. Clinician identified improvement overall in functioning, depressed mood, and relationship with family. Clinician explored changes and noted that she and sister have been working well together arranging the house and getting rid of mother's belongings. Clinician processed grief sxs and noted that she has a better understanding of what normal grief looks like and how she continues to cope daily with the loss of mom. Clinician reviewed coping skills and reflected the positive growth in Helen Sandoval over the past few months.   Plan: Return again in 4 weeks. Possible discharge following next session.   Diagnosis: MDD (major depressive disorder), recurrent episode, mild  Grief  Collaboration of Care: Patient refused AEB none required  Patient/Guardian was advised Release of Information must be  obtained prior to any record release in order to collaborate their care with an outside provider. Patient/Guardian was advised if they have not already done so to contact the registration department to sign all necessary forms in order for us  to release information regarding their care.   Consent: Patient/Guardian gives verbal consent for treatment and assignment of benefits for services provided during this visit. Patient/Guardian expressed understanding and agreed to proceed.   Harlene SAUNDERS [place], LCSW 03/21/2024

## 2024-03-25 ENCOUNTER — Other Ambulatory Visit: Payer: Self-pay | Admitting: Obstetrics & Gynecology

## 2024-03-25 DIAGNOSIS — Z1231 Encounter for screening mammogram for malignant neoplasm of breast: Secondary | ICD-10-CM

## 2024-03-26 ENCOUNTER — Ambulatory Visit (HOSPITAL_COMMUNITY): Admitting: Licensed Clinical Social Worker

## 2024-03-26 ENCOUNTER — Encounter (HOSPITAL_COMMUNITY): Payer: Self-pay | Admitting: Licensed Clinical Social Worker

## 2024-03-26 DIAGNOSIS — F330 Major depressive disorder, recurrent, mild: Secondary | ICD-10-CM

## 2024-03-26 DIAGNOSIS — F4321 Adjustment disorder with depressed mood: Secondary | ICD-10-CM

## 2024-03-26 NOTE — Progress Notes (Signed)
   THERAPIST PROGRESS NOTE  Session Time: 3:30pm-4:25pm  Participation Level: Active  Behavioral Response: Well GroomedAlertDepressed and Euthymic  Type of Therapy: Individual Therapy  Treatment Goals addressed:  Problem: OP Depression         Dates: Start:  12/28/23           Disciplines: Interdisciplinary, PROVIDER               Goal: LTG: Reduce frequency, intensity, and duration of depression symptoms so that daily functioning is improved     Dates: Start:  12/28/23    Expected End:  06/28/24       Disciplines: Interdisciplinary, PROVIDER                Goal: LTG: Increase coping skills to manage depression and improve ability to perform daily activities     Dates: Start:  12/28/23    Expected End:  06/28/24       Disciplines: Interdisciplinary, PROVIDER      ProgressTowards Goals: Progressing  Interventions: CBT and Other: grief counseling  Summary: Rahaf Carbonell is a 63 y.o. female who presents with MDD, mild.   Suicidal/Homicidal: Nowithout intent/plan  Therapist Response: Timira Bieda engaged well in individual in person session with clinician. Clinician utilized CBT to process thoughts, feelings, and behaviors. Clinician processed recent experience at church where her mother was honored, which took her by surprise. Clinician reflected the pride and also sadness over the loss of mother almost 1 year ago. Clinician provided psychoeducation about grief coming back in waves during special dates, anniversaries, and holidays. Clinician explored the impact of Daylight Savings Time on mood and energy level. Clinician encouraged Ceniyah Thorp to focus on the happy stories and find the joy in her life with her mother.   Plan: Return again in 2-4 weeks.  Diagnosis: MDD (major depressive disorder), recurrent episode, mild  Grief  Collaboration of Care: Patient refused AEB none required  Patient/Guardian was advised Release of Information must be obtained prior to any record release  in order to collaborate their care with an outside provider. Patient/Guardian was advised if they have not already done so to contact the registration department to sign all necessary forms in order for us  to release information regarding their care.   Consent: Patient/Guardian gives verbal consent for treatment and assignment of benefits for services provided during this visit. Patient/Guardian expressed understanding and agreed to proceed.   Harlene SAUNDERS [place], LCSW 03/26/2024

## 2024-04-01 ENCOUNTER — Ambulatory Visit: Admitting: Pulmonary Disease

## 2024-04-01 ENCOUNTER — Encounter: Payer: Self-pay | Admitting: Pulmonary Disease

## 2024-04-01 VITALS — BP 118/82 | HR 96 | Temp 97.5°F | Ht 64.0 in | Wt 182.0 lb

## 2024-04-01 DIAGNOSIS — J4540 Moderate persistent asthma, uncomplicated: Secondary | ICD-10-CM | POA: Diagnosis not present

## 2024-04-01 MED ORDER — BUDESONIDE-FORMOTEROL FUMARATE 160-4.5 MCG/ACT IN AERO: 2.0000 | INHALATION_SPRAY | Freq: Two times a day (BID) | RESPIRATORY_TRACT | 12 refills | Status: AC

## 2024-04-01 NOTE — Progress Notes (Signed)
 @Patient  ID: Helen Sandoval, female    DOB: 1960/06/05, 63 y.o.   MRN: [id number]  Chief Complaint  Patient presents with   Medical Management of Chronic Issues   COPD    Breathing is doing well.  She has some cough when she takes trelegy, so she occ stops taking med.     Referring provider: Verdia Lombard, MD  HPI:   63 y.o. woman whom we are seeing to establish care for ongoing evaluation of asthma.  Multiple prior pulmonary notes reviewed.  Presents to establish care with new physician.  Previously cared for here in clinic by Dr. Brenna.  Most recently seen by Landry Ferrari, NP.  Overall she is doing well.  Maintained on Trelegy 100 mcg dose.  She uses it once daily for about a week.  Throughout the week she develops a cough.  Dry.  Nonproductive.  So then stop her Trelegy for about a week and the cough goes away.  The cycle of repeated itself over and over again.  She feels the Trelegy is likely contributing to her cough.  Also reports hoarse voice.  We discussed sometimes dry powder inhalers can do this.  Or could be the inhaled steroid.  She has not needed prednisone  for breathing in some many months or years.  She did get a course of doxycycline  07/2023 on my review.  Given her relatively workable trial of disease, rare albuterol  use.  Discussed stepdown therapy as opposed to transition to Breztri .  We agreed on stepdown therapy.  Most recent CT scan 6/25 low-dose lung cancer screening scan reviewed shows mild emphysematous changes, otherwise clear lungs on my review and interpretation.  Questionaires / Pulmonary Flowsheets:   ACT:      No data to display          MMRC:     No data to display          Epworth:      No data to display          Tests:   FENO:  No results found for: NITRICOXIDE  PFT:    Latest Ref Rng & Units 09/28/2022    1:44 PM  PFT Results  FVC-Pre L 3.13   FVC-Predicted Pre % 98   FVC-Post L 3.10   FVC-Predicted Post % 97   Pre  FEV1/FVC % % 78   Post FEV1/FCV % % 77   FEV1-Pre L 2.44   FEV1-Predicted Pre % 99   FEV1-Post L 2.37   DLCO uncorrected ml/min/mmHg 21.88   DLCO UNC% % 109   DLCO corrected ml/min/mmHg 21.88   DLCO COR %Predicted % 109   DLVA Predicted % 106   TLC L 5.56   TLC % Predicted % 110   RV % Predicted % 84   Personally reviewed and interpreted as normal spirometry.  No bronchodilator response.  Lung volumes within normal limits.  DLCO within normal limits.  WALK:      No data to display          Imaging: Personally reviewed and as per EMR in discussion this note No results found.  Lab Results: Personally reviewed CBC    Component Value Date/Time   WBC 5.1 10/26/2022 1523   WBC 5.8 09/28/2022 1539   RBC 4.11 10/26/2022 1523   RBC 3.93 09/28/2022 1539   HGB 13.0 10/26/2022 1523   HCT 38.6 10/26/2022 1523   PLT 129 (L) 10/26/2022 1523   MCV 94 10/26/2022 1523  MCH 31.6 10/26/2022 1523   MCH 32.2 08/31/2021 1453   MCHC 33.7 10/26/2022 1523   MCHC 33.8 09/28/2022 1539   RDW 13.6 10/26/2022 1523   LYMPHSABS 0.7 10/26/2022 1523   MONOABS 0.3 09/28/2022 1539   EOSABS 0.1 10/26/2022 1523   BASOSABS 0.0 10/26/2022 1523    BMET    Component Value Date/Time   NA 141 10/26/2022 1523   K 4.1 10/26/2022 1523   CL 102 10/26/2022 1523   CO2 26 10/26/2022 1523   GLUCOSE 98 10/26/2022 1523   GLUCOSE 154 (H) 03/02/2021 1318   BUN 19 10/26/2022 1523   CREATININE 0.92 10/26/2022 1523   CREATININE 1.02 (H) 03/02/2021 1318   CALCIUM 9.3 10/26/2022 1523   GFRNONAA >60 03/02/2021 1318   GFRAA 75 02/27/2020 1316    BNP No results found for: BNP  ProBNP No results found for: PROBNP  Specialty Problems       Pulmonary Problems   OSA treated with BiPAP   Overview:  Chronic [Sleep Apnea]      Bronchitis, chronic (HCC)   Acute bronchitis with COPD (HCC)   URI (upper respiratory infection)    Allergies  Allergen Reactions   Clindamycin Anaphylaxis    Indomethacin Hives   Levofloxacin Hives   Penicillins Anaphylaxis   Penicillins Cross Reactors Anaphylaxis   Sulfa Antibiotics Swelling    Blister (steven john syndrome)   Levaquin [Levofloxacin Hemihydrate] Hives   Lipitor  [Atorvastatin Calcium]    Multihance  [Gadobenate] Hives    Pt having hives/ rash on neck and chest    Sulfamethoxazole-Trimethoprim    Zocor [Simvastatin] Other (See Comments)    Other reaction(s): MUSCLE PAIN Muscle aches    Immunization History  Administered Date(s) Administered   Fluad Trivalent(High Dose 65+) 02/21/2024   Influenza, Seasonal, Injecte, Preservative Fre 04/01/2009   Influenza,inj,Quad PF,6+ Mos 01/25/2016   Influenza,inj,quad, With Preservative 01/23/2017   Influenza,trivalent, recombinat, inj, PF 02/02/2023   Influenza-Unspecified 01/30/2013, 02/20/2014, 06/26/2017, 01/21/2018, 02/14/2018, 03/16/2021   MMR 09/15/2009   PFIZER(Purple Top)SARS-COV-2 Vaccination 07/06/2019, 07/28/2019, 01/14/2020   PNEUMOCOCCAL CONJUGATE-20 08/03/2021   PPD Test 09/15/2010   Pfizer(Comirnaty)Fall Seasonal Vaccine 12 years and older 02/02/2023   Pneumococcal Conjugate-13 06/23/2015   Pneumococcal Polysaccharide-23 10/29/2014, 12/02/2015   Tdap 09/07/2009   Zoster Recombinant(Shingrix) 02/27/2019    Past Medical History:  Diagnosis Date   Ankle fracture    Depression    Diabetes (HCC)    Fatty liver    Osteopenia    Osteoporosis    Scarlet fever    Shingles    Sleep apnea    Spina bifida    Splenomegaly    Thrombocytopenia    Vitamin D  deficiency     Tobacco History: Social History   Tobacco Use  Smoking Status Former   Current packs/day: 0.00   Types: Cigarettes   Quit date: 11/21/2022   Years since quitting: 1.3  Smokeless Tobacco Never   Counseling given: Not Answered   Continue to not smoke  Outpatient Encounter Medications as of 04/01/2024  Medication Sig   albuterol  (PROVENTIL ) (2.5 MG/3ML) 0.083% nebulizer solution Take 3  mLs (2.5 mg total) by nebulization every 6 (six) hours as needed for wheezing or shortness of breath.   albuterol  (VENTOLIN  HFA) 108 (90 Base) MCG/ACT inhaler Inhale 2 puffs into the lungs every 6 (six) hours as needed for wheezing or shortness of breath.   aspirin EC 81 MG tablet Take 81 mg by mouth every other day.   budesonide-formoterol (SYMBICORT)  160-4.5 MCG/ACT inhaler Inhale 2 puffs into the lungs in the morning and at bedtime.   buPROPion  (WELLBUTRIN  XL) 300 MG 24 hr tablet Take 1 tablet (300 mg total) by mouth in the morning.   busPIRone  (BUSPAR ) 30 MG tablet Take 1 tablet (30 mg total) by mouth 2 (two) times daily.   cyclobenzaprine  (FLEXERIL ) 10 MG tablet Take 1 tablet (10 mg total) by mouth at bedtime.   DAYVIGO  5 MG TABS TAKE 1 TABLET BY MOUTH AT BEDTIME   DULoxetine  (CYMBALTA ) 30 MG capsule Take 1 capsule (30 mg total) by mouth daily. Take with 60 mg tab for a total of 90 mgs a day   DULoxetine  (CYMBALTA ) 60 MG capsule Take 1 capsule (60 mg total) by mouth daily. Take with 30 mg tab for a total of 90 mgs a day   ezetimibe (ZETIA) 10 MG tablet Take 10 mg by mouth daily.    gabapentin  (NEURONTIN ) 300 MG capsule TAKE 1 CAPSULE BY MOUTH THREE TIMES DAILY   ipratropium (ATROVENT) 0.03 % nasal spray Place 2 sprays into both nostrils daily at 2 PM.   metFORMIN (GLUCOPHAGE-XR) 500 MG 24 hr tablet Take 2 tablets by mouth 2 (two) times daily with a meal. 1 tablet in morning and 2 tablets at night   MOUNJARO 10 MG/0.5ML Pen 10 mg Subcutaneous once a week; Duration: 90 days   MYRBETRIQ  50 MG TB24 tablet Take 1 tablet by mouth once daily   pantoprazole (PROTONIX) 40 MG tablet Take 40 mg by mouth every morning.   Spacer/Aero-Holding Chambers (EQ SPACE CHAMBER ANTI-STATIC) DEVI USE AS DIRECTED WITH  INHALERS   varenicline  (CHANTIX ) 1 MG tablet Take 1 tablet by mouth twice daily   [DISCONTINUED] Fluticasone-Umeclidin-Vilant (TRELEGY ELLIPTA ) 200-62.5-25 MCG/ACT AEPB Inhale 1 puff into the lungs  daily.   Cladribine (MAVENCLAD, 9 TABS, PO) Take by mouth. (Patient not taking: Reported on 04/01/2024)   [DISCONTINUED] doxycycline  (VIBRA -TABS) 100 MG tablet Take 1 tablet (100 mg total) by mouth 2 (two) times daily. (Patient not taking: Reported on 01/30/2024)   [DISCONTINUED] Fluticasone-Umeclidin-Vilant (TRELEGY ELLIPTA ) 200-62.5-25 MCG/ACT AEPB Inhale 1 puff into the lungs daily.   No facility-administered encounter medications on file as of 04/01/2024.     Review of Systems  Review of Systems  N/a Physical Exam  BP 118/82   Pulse 96   Temp (!) 97.5 F (36.4 C) (Oral)   Ht 5' 4 (1.626 m) Comment: per pt  Wt 182 lb (82.6 kg)   SpO2 98% Comment: on RA  BMI 31.24 kg/m   Wt Readings from Last 5 Encounters:  04/01/24 182 lb (82.6 kg)  10/24/23 197 lb (89.4 kg)  10/20/23 196 lb (88.9 kg)  10/11/23 196 lb 9.6 oz (89.2 kg)  08/21/23 190 lb (86.2 kg)    BMI Readings from Last 5 Encounters:  04/01/24 31.24 kg/m  10/24/23 33.81 kg/m  10/20/23 33.64 kg/m  10/11/23 33.75 kg/m  08/21/23 32.61 kg/m     Physical Exam General: Sitting in chair, no acute distress Eyes: EOMI, icterus Neck: Supple, no JVP appreciated sitting upright Pulmonary: Clear, normal work of breathing Cardiovascular: Warm, no edema noted Abdomen: Nondistended   Assessment & Plan:   Asthma: Likely triggered by illness with prolonged bronchitis symptoms.  Symptoms currently well-controlled on Trelegy.  Unfortunately Trelegy causing with sounds like dry cough which goes away after 1 week of no use.  Given well-controlled disease, stepdown to ICS/LABA via Symbicort.  Continue as needed albuterol , rare use currently.  Eosinophils  not elevated in the past on review, consider IgE, RAST panel for additional phenotyping if symptoms worsen.  Tobacco abuse in remission: Recent cravings of cigarette prompting using Chantix  again.  Using 1 mg once daily every other day.  Cravings improved.  Encouraged to back  off to 1 tablet Monday Wednesday Friday and if tablets remain gone to discontinue use.   Return in about 3 months (around 07/02/2024) for f/u Dr. Annella.   Donnice JONELLE Annella, MD 04/01/2024

## 2024-04-01 NOTE — Patient Instructions (Signed)
 Nice to meet you  Given the cough issues with Trelegy and how well your breathing has been controlled, lets stop the Trelegy  I sent a new prescription for Symbicort, 2 puffs in the morning, 2 puffs in the evening every day.  Rinse her mouth out with water after every use.  If this is too expensive do not pick it up from the pharmacy.  Send me a message and I will look for more cost-effective solution.  Continue the Chantix  1 tablet every other day, if your cravings are better I would decrease to 3 times a week, when it was a Friday, for a couple weeks then stop it.  Return to clinic in 3 months or sooner if needed with Dr. Annella

## 2024-04-02 ENCOUNTER — Ambulatory Visit (HOSPITAL_COMMUNITY): Admitting: Psychiatry

## 2024-04-09 ENCOUNTER — Telehealth (HOSPITAL_COMMUNITY): Payer: Self-pay

## 2024-04-09 ENCOUNTER — Ambulatory Visit (HOSPITAL_COMMUNITY): Admitting: Licensed Clinical Social Worker

## 2024-04-09 DIAGNOSIS — F330 Major depressive disorder, recurrent, mild: Secondary | ICD-10-CM

## 2024-04-09 NOTE — Telephone Encounter (Signed)
 Patient stopped by after her therapy appointment, she is having a flare in her MS - increased depression, shaking/ tremors. She said she does not follow up until December and she wants to discuss her medications. Please review and advise, thank you

## 2024-04-10 ENCOUNTER — Encounter (HOSPITAL_COMMUNITY): Payer: Self-pay | Admitting: Licensed Clinical Social Worker

## 2024-04-10 NOTE — Progress Notes (Signed)
   THERAPIST PROGRESS NOTE  Session Time: 2:30pm-3:15pm  Participation Level: Active  Behavioral Response: Well GroomedAlertDepressed  Type of Therapy: Individual Therapy  Treatment Goals addressed:  Problem: OP Depression         Dates: Start:  12/28/23           Disciplines: Interdisciplinary, PROVIDER               Goal: LTG: Reduce frequency, intensity, and duration of depression symptoms so that daily functioning is improved     Dates: Start:  12/28/23    Expected End:  06/28/24       Disciplines: Interdisciplinary, PROVIDER                Goal: LTG: Increase coping skills to manage depression and improve ability to perform daily activities     Dates: Start:  12/28/23    Expected End:  06/28/24       Disciplines: Interdisciplinary, PROVIDER       ProgressTowards Goals: Progressing  Interventions: CBT  Summary: Helen Sandoval is a 63 y.o. female who presents with MDD, recurrent, mild.   Suicidal/Homicidal: Nowithout intent/plan  Therapist Response: Annah Jasko engaged well in individual in person session with clinician. Clinician utilized CBT to process thoughts, feelings, and interactions. Clinician noted that [name] is currently in an MS flare-up, which has increased pain, tremors, and some frustration. Helen Sandoval shared that she feels embarrassed that she has been spilling things, not been able to feed herself with a fork due to hands shaking, etc. Clinician validated those feelings and identified the need for improved positive self talk. Clinician processed the importance of self compassion in order to reduce the frustration and depression about having MS. Clinician noted concerns about medication with sleep and tremors.   Plan: Return again in 2 weeks.  Diagnosis: MDD (major depressive disorder), recurrent episode, mild  Collaboration of Care: Psychiatrist AEB following session, clinician took Helen Sandoval to talk to nurse about concerns about medication. Later that day,  Dr. Carvin was able to follow upwith her about meds and provided feedback.  Patient/Guardian was advised Release of Information must be obtained prior to any record release in order to collaborate their care with an outside provider. Patient/Guardian was advised if they have not already done so to contact the registration department to sign all necessary forms in order for us  to release information regarding their care.   Consent: Patient/Guardian gives verbal consent for treatment and assignment of benefits for services provided during this visit. Patient/Guardian expressed understanding and agreed to proceed.   Harlene SAUNDERS [place], LCSW 04/10/2024

## 2024-04-10 NOTE — Telephone Encounter (Signed)
 Patient was contacted via telephone for approximately for approximately 10 minutes.  Patient reports that she is not great right now as she is in the middle of a MS flare up. She has a headache and pain from this which has led to worsening mood symptoms.  She wanted to check in in regards to her medications to certain whether it is okay to continue all of them at this time.  Since last visit she did start the dayvigo  is hit or miss in its effect, but it is better then anything else she has taken for insomnia.  Some nights after taking it she sleeps quite well overnight.  Given the improvement and no identifiable adverse side effects we recommend to continue on this and all other medications currently.  We also recommended that she connect with her neurologist to notify them of the current flareup.  Patient had also mention the tremors which had improved a few months ago have gradually returned.  Suggested discussing with neurologist if there were any other viable options other than Klonopin  to manage his.

## 2024-04-15 DIAGNOSIS — G729 Myopathy, unspecified: Secondary | ICD-10-CM | POA: Diagnosis not present

## 2024-04-15 DIAGNOSIS — G720 Drug-induced myopathy: Secondary | ICD-10-CM | POA: Diagnosis not present

## 2024-04-15 DIAGNOSIS — E782 Mixed hyperlipidemia: Secondary | ICD-10-CM | POA: Diagnosis not present

## 2024-04-15 DIAGNOSIS — D696 Thrombocytopenia, unspecified: Secondary | ICD-10-CM | POA: Diagnosis not present

## 2024-04-15 DIAGNOSIS — E038 Other specified hypothyroidism: Secondary | ICD-10-CM | POA: Diagnosis not present

## 2024-04-15 DIAGNOSIS — N182 Chronic kidney disease, stage 2 (mild): Secondary | ICD-10-CM | POA: Diagnosis not present

## 2024-04-15 DIAGNOSIS — E1122 Type 2 diabetes mellitus with diabetic chronic kidney disease: Secondary | ICD-10-CM | POA: Diagnosis not present

## 2024-04-15 DIAGNOSIS — F321 Major depressive disorder, single episode, moderate: Secondary | ICD-10-CM | POA: Diagnosis not present

## 2024-04-16 ENCOUNTER — Ambulatory Visit (HOSPITAL_COMMUNITY): Admitting: Psychiatry

## 2024-04-22 ENCOUNTER — Encounter

## 2024-04-22 DIAGNOSIS — G35D Multiple sclerosis, unspecified: Secondary | ICD-10-CM | POA: Diagnosis not present

## 2024-04-22 DIAGNOSIS — E1122 Type 2 diabetes mellitus with diabetic chronic kidney disease: Secondary | ICD-10-CM | POA: Diagnosis not present

## 2024-04-22 DIAGNOSIS — M79662 Pain in left lower leg: Secondary | ICD-10-CM | POA: Diagnosis not present

## 2024-04-22 DIAGNOSIS — Z0000 Encounter for general adult medical examination without abnormal findings: Secondary | ICD-10-CM | POA: Diagnosis not present

## 2024-04-22 DIAGNOSIS — Z1231 Encounter for screening mammogram for malignant neoplasm of breast: Secondary | ICD-10-CM

## 2024-04-22 DIAGNOSIS — N182 Chronic kidney disease, stage 2 (mild): Secondary | ICD-10-CM | POA: Diagnosis not present

## 2024-04-23 ENCOUNTER — Ambulatory Visit: Admission: RE | Admit: 2024-04-23 | Discharge: 2024-04-23 | Disposition: A | Source: Ambulatory Visit

## 2024-04-23 DIAGNOSIS — Z1231 Encounter for screening mammogram for malignant neoplasm of breast: Secondary | ICD-10-CM | POA: Diagnosis not present

## 2024-04-23 DIAGNOSIS — M79662 Pain in left lower leg: Secondary | ICD-10-CM | POA: Diagnosis not present

## 2024-04-23 DIAGNOSIS — M79605 Pain in left leg: Secondary | ICD-10-CM | POA: Diagnosis not present

## 2024-04-24 DIAGNOSIS — I872 Venous insufficiency (chronic) (peripheral): Secondary | ICD-10-CM | POA: Diagnosis not present

## 2024-04-25 ENCOUNTER — Encounter: Payer: Self-pay | Admitting: Neurology

## 2024-04-26 DIAGNOSIS — M810 Age-related osteoporosis without current pathological fracture: Secondary | ICD-10-CM | POA: Diagnosis not present

## 2024-04-29 NOTE — Telephone Encounter (Signed)
 Pt has called to confirm that she will accept the 3:00 appointment on Wed 12/10.  EP[facility] would not allow phone to schedule. Please have pt scheduled for 3pm appointment with Dr Vear on 12/10, pt aware of arrival time of 60

## 2024-04-30 ENCOUNTER — Ambulatory Visit (HOSPITAL_COMMUNITY): Admitting: Licensed Clinical Social Worker

## 2024-04-30 DIAGNOSIS — F330 Major depressive disorder, recurrent, mild: Secondary | ICD-10-CM

## 2024-05-01 ENCOUNTER — Encounter: Payer: Self-pay | Admitting: Neurology

## 2024-05-01 ENCOUNTER — Ambulatory Visit: Admitting: Neurology

## 2024-05-01 DIAGNOSIS — R269 Unspecified abnormalities of gait and mobility: Secondary | ICD-10-CM | POA: Diagnosis not present

## 2024-05-01 DIAGNOSIS — R5383 Other fatigue: Secondary | ICD-10-CM | POA: Diagnosis not present

## 2024-05-01 DIAGNOSIS — N3941 Urge incontinence: Secondary | ICD-10-CM

## 2024-05-01 DIAGNOSIS — G35C1 Active secondary progressive multiple sclerosis: Secondary | ICD-10-CM | POA: Diagnosis not present

## 2024-05-01 DIAGNOSIS — R413 Other amnesia: Secondary | ICD-10-CM

## 2024-05-01 DIAGNOSIS — G250 Essential tremor: Secondary | ICD-10-CM | POA: Diagnosis not present

## 2024-05-01 MED ORDER — GEMTESA 75 MG PO TABS: ORAL_TABLET | ORAL | 11 refills | Status: AC

## 2024-05-01 NOTE — Progress Notes (Signed)
 [facility]  PATIENT: Helen Sandoval DOB: [birth year]  REFERRING DOCTOR OR PCP:  Norleen Lunger. ([facility])   Katheryn Sharps (new PCP at [facility]) SOURCE: patient and records from [facility]  _________________________________   HISTORICAL  CHIEF COMPLAINT:  Chief Complaint  Patient presents with   Multiple Sclerosis    Rm11, sister present, Pt is here in regards to a MS RELAPSE, urinary incontinence      HISTORY OF PRESENT ILLNESS:  Helen Sandoval is a 63yo woman who was diagnosed with relapsing remitting MS in 2015.     Update 05/01/2024: She is feeling weaker and has more tremor and notes more difficulty with her gait.   She denies recent infections    She took [facility] 2020 and 2021  She notes the tremor is doing worse.    Her psychiatrist stopped the clonazepam  and started Dayvigo  for her insomnia.  She still has trouble with insomnia, sleep maintenance worse than onset..   She is also on duloxetine .    She had been on the clonazepam  at bedtime only so likely not helping the tremor much.     She had neurocognitive testing (Dr. 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   We discussed that memory problems could be multifactorial.  We know that she has several medical conditions that might play a role in reduced focus/attention and subsequently memory including anxiety/depression, multiple sclerosis and sleep apnea.  The amyloid beta 42/40 ratio was normal.  [id number] was mildly elevated (could also be due to other causes such as her MS).  Her MS appears to be stable.  Specifically, MRIs of the brain in 2022 and 2024 showed no new lesions.  She is not on a DMT but completed the second year of Mavenclad in April 2021.     She has a new VPAP.    (VPAP EPAP range 8-18 with PS5) PSG 07/24/2023 showed Sleep Disorder Severe OSA with an AHI=48/h.   OSA was more severe supine (supine  OSA AHI = 79.6/h. No stage N3 sleep was recorded.  Only 4.5 minutes REM sleep was recorded with a REM latency of 3 hr 18 minutes.  Sleep efficiency was 74%. No significant PLMS was recorded Mild nocturnal hypoxemia with 7% of the night below SaO2% of 90%  Download at the last visit showed 77% 4 hour use and AHI at 4.4 (was 48 at baseline).  She was given a nasal pillow/mouth facemask.  She likes the fact it is less bulky but it is leaking tremendously and she has needed to use her full facemask.  She showed me a picture and it looks like her nostrils are not completely covered.    She continues to have difficulty with her gait.  Her legs feel a bit weak, similar to last year.  Her legs shake going downstairs due to weakness.  She does better going up.  She notes some dysesthesias helped by gabapentin .  She has left > right leg weakness.   If she squats she has trouble getting up..  When she shows me where the pain is, she points to the region of the trochanteric bursa.  She notes that area is tender if she pushes  Tremors are bothersome. .  Eating, writing and other activities with the hands is more difficult.  She has chronic bronchitis so beta-blockers are somewhat contraindicated..    She no longer smokes.   She has COPD and  tremors are sometimes worse with the nebulizer.    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  Of note, her mother also had tremors.    She has depression and anxiety and is on Duloxetine , buspirone  and wellbutrin .   Her mom died in 30-Nov-2024and she was much worse for a few months   She saw psychiatry.      Urinary urgency and Incontinence is better with Myrbetriq .    Vision is unchanged.     She has lost weight after being diagnosed  with NIDDM type 2 .  She is now on Mounjoro with metformin and has lost 11 pounds.  Her HgbA1c is better.   She has NAFLD (Fatty liver).    Vascular risks:   She has NIDDM.  HTN, Tobacco x 35-40 years  MS History:    In 2008, she  had headaches and poor balance and was told the MRI had some white matter foci at that time.    She had spinal stenosis and needed surgery in 2014.  The surgeon felt her gait and other issues were decreased out of proportion to the extent of spine disease and referred her to Neurology.   Besides gait issues, she also had double vision, blurry vision, cognitive difficulty and incontinence in early 2015. She saw Dr. Rachelle in [place]. An MRI of the brain, CSF and visual evoked potentials were consistent with multiple sclerosis.    Initially, she was placed on Tecfidera. Due to progression with more neurologic symptoms, she was switched to Tysabri .   Her last MRI was done 10/2014 but we do not have the images.    Imaging: MRI brain 05/14/2020 shows T2/FLAIR hyperintense foci in the hemispheres, pons and left basal ganglia.  Most of the foci are nonspecific.  A few are periventricular and radially oriented.  The focus in the left basal ganglia could also represent ischemic sequela.    None of the foci appear to be acute and they do not enhance.    There are no new lesions compared with 05/12/2018 MRI.  MRI of the cervical spine shows 1 or 2 small subtle foci within the spinal cord adjacent to C4-C5 and C6.  These have been seen on previous MRIs.  Though nonspecific, this is consistent with a chronic demyelinating plaque.  Minimal chronic compressive myelopathic signal cannot be ruled out.  There are no new lesions and no enhancing lesions.   Prior C4-C6 ACDF.  There is no nerve root compression or spinal stenosis at these levels.   Mild spinal stenosis and mild foraminal narrowing at C3-C4 and C6-C7 that does not lead to any nerve root compression.  Degenerative changes are stable compared to the 2020 MRI.  MRI of the brain 05/19/2021 showed no new lesions.  MRI of the cervical spine 05/19/2021 showed no new lesions.  MRI of the brain and cervical spine 11/2022 showed no new MS lesions.  Stable DDD/DJD with  spinal stenosis at C3C4 aad C6C7  MRI f the lumbar spine 04/02/2023 showed Multilevel degenerative changes as detailed above that do not lead to spinal stenosis or nerve root compression. There is moderate facet hypertrophy at L4-L5 with moderately severe facet hypertrophy at L5-S1.    REVIEW OF SYSTEMS: Constitutional: No fevers, chills, sweats, or change in appetite.   She notes fatigue Eyes: Reports visual changes and double vision.  No eye pain Ear, nose and throat: No hearing loss, ear pain, nasal congestion, sore throat Cardiovascular: No chest pain, palpitations Respiratory:  No shortness of breath at rest or with exertion.   She has snoring and coughing but no wheezes GastrointestinaI: No nausea, vomiting, abdominal pain.  She notes diarrhea and some fecal incontinence Genitourinary:  No dysuria, urinary retention or frequency. She has had some incontinence Musculoskeletal:  No neck pain, back pain Integumentary: No rash, pruritus, skin lesions Neurological: as above Psychiatric: Some depression at this time.  Some anxiety Endocrine: No palpitations, diaphoresis, change in appetite, change in weigh or increased thirst Hematologic/Lymphatic:  No anemia, purpura, petechiae. Allergic/Immunologic: No itchy/runny eyes, nasal congestion, recent allergic reactions, rashes  ALLERGIES: Allergies  Allergen Reactions   Clindamycin Anaphylaxis   Indomethacin Hives   Levofloxacin Hives   Penicillin G Anaphylaxis    Other Reaction(s): Anaphylaxis-Streptomycin   Penicillins Anaphylaxis   Penicillins Cross Reactors Anaphylaxis   Sulfa Antibiotics Swelling and Other (See Comments)    Blister (steven john syndrome)  Other Reaction(s): hives /blisters, Unknown   Sulfamethoxazole-Trimethoprim Anaphylaxis   Atorvastatin     Other Reaction(s): Not available   Levaquin [Levofloxacin Hemihydrate] Hives   Lipitor  [Atorvastatin Calcium]    Multihance  [Gadobenate] Hives    Pt having hives/  rash on neck and chest    Zocor [Simvastatin] Other (See Comments)    Other reaction(s): MUSCLE PAIN Muscle aches   Gadolinium Rash    HOME MEDICATIONS:  Current Outpatient Medications:    albuterol  (PROVENTIL ) (2.5 MG/3ML) 0.083% nebulizer solution, Take 3 mLs (2.5 mg total) by nebulization every 6 (six) hours as needed for wheezing or shortness of breath., Disp: 75 mL, Rfl: 12   albuterol  (VENTOLIN  HFA) 108 (90 Base) MCG/ACT inhaler, Inhale 2 puffs into the lungs every 6 (six) hours as needed for wheezing or shortness of breath., Disp: 8 g, Rfl: 6   aspirin EC 81 MG tablet, Take 81 mg by mouth every other day., Disp: , Rfl:    budesonide -formoterol  (SYMBICORT ) 160-4.5 MCG/ACT inhaler, Inhale 2 puffs into the lungs in the morning and at bedtime., Disp: 1 each, Rfl: 12   buPROPion  (WELLBUTRIN  XL) 300 MG 24 hr tablet, Take 1 tablet (300 mg total) by mouth in the morning., Disp: 30 tablet, Rfl: 2   busPIRone  (BUSPAR ) 30 MG tablet, Take 1 tablet (30 mg total) by mouth 2 (two) times daily., Disp: 60 tablet, Rfl: 2   Cladribine (MAVENCLAD, 9 TABS, PO), Take by mouth., Disp: , Rfl:    cyclobenzaprine  (FLEXERIL ) 10 MG tablet, Take 1 tablet (10 mg total) by mouth at bedtime., Disp: 90 tablet, Rfl: 3   DAYVIGO  5 MG TABS, TAKE 1 TABLET BY MOUTH AT BEDTIME, Disp: 30 tablet, Rfl: 2   DULoxetine  (CYMBALTA ) 30 MG capsule, Take 1 capsule (30 mg total) by mouth daily. Take with 60 mg tab for a total of 90 mgs a day, Disp: 30 capsule, Rfl: 2   DULoxetine  (CYMBALTA ) 60 MG capsule, Take 1 capsule (60 mg total) by mouth daily. Take with 30 mg tab for a total of 90 mgs a day, Disp: 30 capsule, Rfl: 2   ezetimibe (ZETIA) 10 MG tablet, Take 10 mg by mouth daily. , Disp: , Rfl:    gabapentin  (NEURONTIN ) 300 MG capsule, TAKE 1 CAPSULE BY MOUTH THREE TIMES DAILY, Disp: 270 capsule, Rfl: 1   ipratropium (ATROVENT) 0.03 % nasal spray, Place 2 sprays into both nostrils daily at 2 PM., Disp: , Rfl:    metFORMIN  (GLUCOPHAGE-XR) 500 MG 24 hr tablet, Take 2 tablets by mouth 2 (two) times daily with a  meal. 1 tablet in morning and 2 tablets at night, Disp: , Rfl:    MOUNJARO 10 MG/0.5ML Pen, 10 mg Subcutaneous once a week; Duration: 90 days, Disp: , Rfl:    pantoprazole (PROTONIX) 40 MG tablet, Take 40 mg by mouth every morning., Disp: , Rfl:    Spacer/Aero-Holding Chambers (EQ SPACE CHAMBER ANTI-STATIC) DEVI, USE AS DIRECTED WITH  INHALERS, Disp: 1 each, Rfl: 0   varenicline  (CHANTIX ) 1 MG tablet, Take 1 tablet by mouth twice daily, Disp: 60 tablet, Rfl: 0   Vibegron (GEMTESA) 75 MG TABS, One po qd, Disp: 30 tablet, Rfl: 11  PAST MEDICAL HISTORY: Past Medical History:  Diagnosis Date   Ankle fracture    Depression    Diabetes (HCC)    Fatty liver    Osteopenia    Osteoporosis    Scarlet fever    Shingles    Sleep apnea    Spina bifida    Splenomegaly    Thrombocytopenia    Vitamin D  deficiency     PAST SURGICAL HISTORY: Past Surgical History:  Procedure Laterality Date   ABDOMINAL HYSTERECTOMY     fibroids and heavy bleeding   BREAST BIOPSY Right    20+ yrs ago   CERVICAL FUSION      FAMILY HISTORY: Family History  Problem Relation Age of Onset   Bladder Cancer Mother    Colon cancer Mother    Colon cancer Father        diagnosed less than age of 32.    Bladder Cancer Maternal Grandmother    Leukemia Maternal Grandmother    Alzheimer's disease Neg Hx    Breast cancer Neg Hx     SOCIAL HISTORY:  Social History   Socioeconomic History   Marital status: Divorced    Spouse name: Not on file   Number of children: Not on file   Years of education: Not on file   Highest education level: Not on file  Occupational History   Not on file  Tobacco Use   Smoking status: Former    Current packs/day: 0.00    Types: Cigarettes    Quit date: 11/21/2022    Years since quitting: 1.4   Smokeless tobacco: Never  Substance and Sexual Activity   Alcohol use: Yes    Comment:  occasional use. a few times a year.    Drug use: No   Sexual activity: Not on file  Other Topics Concern   Not on file  Social History Narrative   Pt lives with sister    Disability    Social Drivers of Health   Financial Resource Strain: Not on file  Food Insecurity: Not on file  Transportation Needs: Not on file  Physical Activity: Not on file  Stress: Not on file  Social Connections: Not on file  Intimate Partner Violence: Not on file     PHYSICAL EXAM  Vitals:   05/01/24 1450 05/01/24 1457  BP: (!) 141/90 (!) 140/92  Pulse:  93  Weight:  181 lb (82.1 kg)  Height:  5' 4 (1.626 m)    Body mass index is 31.07 kg/m.   General: The patient is well-developed and well-nourished and in no acute distress  Neck: The neck is supple with good ROM and nontender.    Musculoskeletal:  She has tenderness over the trochanteric bursa of the left hip.  No tenderness over SI joint or piriformis muscles  Neurologic Exam  Mental status: The patient is alert and oriented  x 3 at the time of the examination.  During the interaction today, she has apparent normal recent and remote memory, with reduced concentration abilit  Cranial nerves: Extraocular movements appear normal.  Facial strength was normal.  Trapezius strength is normal.  No dysarthria is noted.  Hearing appears normal and symmetric.  Motor: Low amplitude fast tremor in the hands and mouth, worsened with intention.  Muscle bulk is normal.   Tone is normal. Strength is  5 / 5 in all 4 extremities.   Sensory: She reports slightly reduced sensation to vibration in the right leg    Coordination:  finger-nose-finger is performed well.  Heel-to-shin is performed well.  Gait and station: Station is normal.  She has a mildly reduced stride..  Tandem gait is poor.  Romberg is negative.  Reflexes: Deep tendon reflexes are symmetric and 2+ arms and 3+ knees with spread.   There is no ankle clonus..    ASSESSMENT AND PLAN     1. Active secondary progressive multiple sclerosis   2. Benign essential tremor   3. Urge incontinence   4. Gait abnormality   5. Memory loss   6. Other fatigue       1.   She has had similar symptoms.  It is more likely that this is some progression with her active SPMS..  However, she completed Mavenclad in 2020 and 2021 we did check an MRI of the brain to determine if there is any breakthrough activity.  If this is occurring we will need to begin a different disease modifying therapy.   if progression consider teriflunomide 2.   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   She is not a good candidate for a beta blocker candidate due to COPD/IDDM.   If the tremor gets a lot worse we could consider Mysoline but I am reluctant to add another central acting agent due to some mild cognitive issues.  3.   Continue VPAP with PS 5.  She uses it nightly.  Advised to try to lose some more weight.     4.   She is seeing psychiatry.  Medications have been changed to hepl insomnia.     Neurocognitive testing reviewed - nonspecific mild cognitive issues.  6.   Return in 6 months or sooner if there are new or worsening neurologic symptoms.  This visit is part of a comprehensive longitudinal care medical relationship regarding the patients primary diagnosis of MS and related concerns.   Anya Murphey A. Vear, MD, PhD 05/01/2024, 6:02 PM Certified in Neurology, Clinical Neurophysiology, Sleep Medicine, Pain Medicine and Neuroimaging . [facility] [address] [place], [place] [address] [phone]

## 2024-05-02 ENCOUNTER — Telehealth: Payer: Self-pay | Admitting: Neurology

## 2024-05-02 NOTE — Telephone Encounter (Signed)
MRI orders sent to [facility] [phone]

## 2024-05-04 ENCOUNTER — Other Ambulatory Visit (HOSPITAL_COMMUNITY): Payer: Self-pay | Admitting: Psychiatry

## 2024-05-04 DIAGNOSIS — G4721 Circadian rhythm sleep disorder, delayed sleep phase type: Secondary | ICD-10-CM

## 2024-05-05 ENCOUNTER — Other Ambulatory Visit: Payer: Self-pay | Admitting: Neurology

## 2024-05-08 NOTE — Telephone Encounter (Signed)
 Refusing mybetriq

## 2024-05-14 ENCOUNTER — Encounter: Payer: Self-pay | Admitting: Neurology

## 2024-05-14 MED ORDER — PREDNISONE 10 MG PO TABS: ORAL_TABLET | ORAL | 0 refills | Status: AC

## 2024-05-14 NOTE — Telephone Encounter (Signed)
 Called pt. Dr Vear saw her 05/01/24. Concern for possibly some progression with her active SPMS. Pt has MRI brain & c-spine pending for 05/26/24. Patient is reporting worsened pain in her legs. Started as muscle spasm now throbbing. Pt had old Rx of hydrocodone -acetaminophen , doesn't want to take more than once per day. She is taking flexeril  10 mg. Patient said in past when she had issue with her back she tolerated a steroid dose pack well and is wondering if she could be prescribed one again. This needs to be reviewed by work-in provider.

## 2024-05-14 NOTE — Addendum Note (Signed)
 Addended by: MARGARET CARNE R on: 05/14/2024 04:45 PM   Modules accepted: Orders

## 2024-05-14 NOTE — Telephone Encounter (Signed)
 Pt called to  request a callback from Nurse  . Pt legs is in a lot  pain and not sure Md can sent medication over for Pt Tto pick up . Pt stated the pain will not go away

## 2024-05-15 ENCOUNTER — Other Ambulatory Visit: Payer: Self-pay | Admitting: Neurology

## 2024-05-15 NOTE — Telephone Encounter (Signed)
 Last seen on 05/01/24 Follow up scheduled on 12/18/24

## 2024-05-17 ENCOUNTER — Encounter (HOSPITAL_COMMUNITY): Payer: Self-pay | Admitting: Licensed Clinical Social Worker

## 2024-05-17 NOTE — Progress Notes (Signed)
" ° °  THERAPIST PROGRESS NOTE  Session Time: 1:30pm-2:15pm  Participation Level: Active  Behavioral Response: Well GroomedAlertAnxious  Type of Therapy: Individual Therapy  Treatment Goals addressed:   Problem: OP Depression         Dates: Start:  12/28/23           Disciplines: Interdisciplinary, PROVIDER               Goal: LTG: Reduce frequency, intensity, and duration of depression symptoms so that daily functioning is improved     Dates: Start:  12/28/23    Expected End:  06/28/24       Disciplines: Interdisciplinary, PROVIDER                Goal: LTG: Increase coping skills to manage depression and improve ability to perform daily activities     Dates: Start:  12/28/23    Expected End:  06/28/24       Disciplines: Interdisciplinary, PROVIDER      ProgressTowards Goals: Progressing  Interventions: Motivational Interviewing  Summary: Helen Sandoval is a 63 y.o. female who presents with MDD, recurrent, mild.   Suicidal/Homicidal: Nowithout intent/plan  Therapist Response: Helen Sandoval engaged well in individual in person session with clinician. Clinician utilized MI OARS to reflect and summarize thoughts, feelings, and interactions. Clinician processed updates in mood and health. Clinician noted that Helen Sandoval is currently in an MS flareup, which has made typical activities and interactions more difficult. Clinician processed how the physical sxs of MS have impacted her mood. Clinician also identified the importance of listening to her body and allowing herself to act accordingly to what her body feels comfortable doing. Helen Sandoval shared improvement over relationship with sister and increasing willingness to share stories about childhood and mom.   Plan: Return again in 2-3 weeks.  Diagnosis: MDD (major depressive disorder), recurrent episode, mild  Collaboration of Care: Patient refused AEB none required  Patient/Guardian was advised Release of Information must be obtained  prior to any record release in order to collaborate their care with an outside provider. Patient/Guardian was advised if they have not already done so to contact the registration department to sign all necessary forms in order for us  to release information regarding their care.   Consent: Patient/Guardian gives verbal consent for treatment and assignment of benefits for services provided during this visit. Patient/Guardian expressed understanding and agreed to proceed.   Harlene SAUNDERS [place], LCSW 05/17/2024  "

## 2024-05-20 ENCOUNTER — Ambulatory Visit: Admitting: Neurology

## 2024-05-22 ENCOUNTER — Ambulatory Visit (HOSPITAL_COMMUNITY): Admitting: Licensed Clinical Social Worker

## 2024-05-22 ENCOUNTER — Encounter (HOSPITAL_COMMUNITY): Payer: Self-pay | Admitting: Licensed Clinical Social Worker

## 2024-05-22 DIAGNOSIS — F330 Major depressive disorder, recurrent, mild: Secondary | ICD-10-CM

## 2024-05-22 NOTE — Progress Notes (Signed)
 Virtual Visit via Video Note  I connected with [name] on 05/22/2024 at 12:00 PM EST by a video enabled telemedicine application and verified that I am speaking with the correct person using two identifiers.  Location: Patient: home Provider: home office   I discussed the limitations of evaluation and management by telemedicine and the availability of in person appointments. The patient expressed understanding and agreed to proceed.   I discussed the assessment and treatment plan with the patient. The patient was provided an opportunity to ask questions and all were answered. The patient agreed with the plan and demonstrated an understanding of the instructions.   The patient was advised to call back or seek an in-person evaluation if the symptoms worsen or if the condition fails to improve as anticipated.  I provided 55 minutes of non-face-to-face time during this encounter.   [name], LCSW   THERAPIST PROGRESS NOTE  Session Time: 12:00pm-12:55pm  Participation Level: Active  Behavioral Response: Well GroomedAlertEuthymic  Type of Therapy: Individual Therapy  Treatment Goals addressed:  Active     OP Depression     LTG: Reduce frequency, intensity, and duration of depression symptoms so that daily functioning is improved (Progressing)     Start:  12/28/23    Expected End:  06/28/24         LTG: Increase coping skills to manage depression and improve ability to perform daily activities (Progressing)     Start:  12/28/23    Expected End:  06/28/24            ProgressTowards Goals: Progressing  Interventions: CBT  Summary: Latrisa Hellums is a 63 y.o. female who presents with MDD, mild.   Suicidal/Homicidal: Nowithout intent/plan  Therapist Response: Mykia Holton engaged well in individual virtual session with clinician. Clinician utilized CBT to process thoughts, feelings, and behaviors. Clinician discussed updates in mood and physical sxs of MS.  Ronal Jenkins shared that after recent dose of prednisone , she is finally feeling better physically. Clinician discussed progression of MS and noted that she has moved up to Stage 1. Clinician discussed Ronal Donna concern about recent neurologist visit. Ronal Jenkins shared that documentation in the notes did not match what was said in the appointment. She also reported that she will address her concerns at her next appointment and possibly request a provider change. Clinician encouraged Ronal Jenkins to advocate for herself and offered support in locating other neurologists that specialize in MS.   Ronal Jenkins shared that she is doing well with her grief. She is able to reclaim many days for joy that would have made her depressed and tearful all day. Clinician provided feedback about grief.   Plan: Return again in 2 weeks.  Diagnosis: MDD (major depressive disorder), recurrent episode, mild  Collaboration of Care: Psychiatrist AEB Aymar Whitfill will see Dr. Carvin in the coming month. She is doing well psychologically. Physical health concerns are due to MS relapse.  Patient/Guardian was advised Release of Information must be obtained prior to any record release in order to collaborate their care with an outside provider. Patient/Guardian was advised if they have not already done so to contact the registration department to sign all necessary forms in order for us  to release information regarding their care.   Consent: Patient/Guardian gives verbal consent for treatment and assignment of benefits for services provided during this visit. Patient/Guardian expressed understanding and agreed to proceed.   Harlene JONELLE [place], LCSW 05/22/2024

## 2024-05-26 ENCOUNTER — Ambulatory Visit: Admission: RE | Admit: 2024-05-26 | Source: Ambulatory Visit

## 2024-05-26 ENCOUNTER — Ambulatory Visit
Admission: RE | Admit: 2024-05-26 | Discharge: 2024-05-26 | Disposition: A | Discharge status: Home / Self Care | Source: Ambulatory Visit | Attending: Neurology | Admitting: Neurology

## 2024-05-26 ENCOUNTER — Inpatient Hospital Stay: Admission: RE | Admit: 2024-05-26 | Source: Ambulatory Visit

## 2024-05-26 DIAGNOSIS — R269 Unspecified abnormalities of gait and mobility: Secondary | ICD-10-CM

## 2024-05-26 DIAGNOSIS — G35C1 Active secondary progressive multiple sclerosis: Secondary | ICD-10-CM

## 2024-05-27 NOTE — Progress Notes (Signed)
 BH MD/PA/NP OP Progress Note  05/28/2024 4:03 PM Helen Sandoval  MRN:  [id number]  Visit Diagnosis:    ICD-10-CM   1. 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 MDD (major depressive disorder), recurrent episode, moderate (HCC)  F33.1 busPIRone  (BUSPAR ) 30 MG tablet    buPROPion  (WELLBUTRIN  XL) 300 MG 24 hr tablet    DULoxetine  (CYMBALTA ) 60 MG capsule      Assessment: Helen Sandoval is a 64 y.o. female with a history of MDD, relapsing remitting multiple sclerosis (diagnosed 2015), T2DM, OSA, essential tremor, chronic low back pain, and Vitamin D  deficiency who presented to [facility] at [facility] for initial evaluation on 07/25/2023.    At initial evaluation patient reported symptoms of depression including low mood, anhedonia, amotivation, decreased tearfulness, feelings of hopelessness, and passive SI. She consistently denied active SI and there are no acute safety concerns at this time.  Crisis resources were reviewed.  Notably patient's mother who had been significant support in her life passed in November 2024.  While patient denied depressive symptoms worsening at that time she had endorsed otherwise at visit with another provider.  Symptoms progressed past what would be consistent with typical grief response.  Notably patient does have a diagnosis of MS was made in 2015 and it is around this time that her depressive symptoms first onset.  She has undergone numerous medication trials since with periods of improvement followed by loss of benefit from medication.  Patient met criteria for MDD and late sleep phase syndrome on initial evaluation.  Helen Sandoval presents for follow-up evaluation. Today, 05/28/2024, patient has had a decline in mood symptoms secondary to flareup in her MS.  Depressive symptoms did improve during his brief course of prednisone  which resolved the MS flareup however upon completing taper both the depression and the  MS symptoms returned.  We will trial titration of duloxetine  to 120 mg daily and reviewed the risk benefits.  She has endorsed benefit from Dayvigo  for insomnia.  Continue on remainder of current regimen and follow-up in 2 months.  Plan: Bubble Pack all medications (unable to do a current pharmacy patient considering switching or getting medication dispensing machine) - Increase Duloxetine  to 120 mg daily  - Continue Buspar  30 mg BID - Continue Wellbutrin  XL 300 mg daily - Continue Dayvigo  5 mg nightly - Continue Gabapentin  300 mg BID prescribed by neuro - R/o contributing medical conditions: CBC, TSH, CMP, Vit D grossly wnl 10/26/2022;  - Continue therapy with Harlene - Information for MS support groups provided - Sleep study completed, already on BIPAP - Crisis resources reviewed - Follow up in 2 months  Chief Complaint:  Chief Complaint  Patient presents with   Follow-up   HPI: Helen Sandoval presents alongside her sister who participated in the session with patient's approval.  Helen Sandoval reports that she is having a rough time of it. She is dealing with significant pain and balance issues which are attributed to the MS. She had been taking a course of prednisone  which helped, but symptoms returned once the prednisone  course finished.  Similarly the essential tremor improved significantly on the prednisone  before reoccurring after finishing the medication course.  Since this most recent flareup patient reports that her depression has not turned gotten worse.  She is still up and active most days but can have 1 to 2 days a week of increased depression where she spends most of the day in  bed.  Notably it is the flareup and pains that lead to the depression and being bedbound.  Sleep has also been affected.  Patient reports has been improvement when she uses Dayvigo  and sister confirms this.  The nighttime awakenings have also improved with the initiation of a new incontinence medication.  Patient does  not take the Dayvigo  on nights that she takes the pain medication.  Sleep is worse those nights.  While worsening mood symptoms seem to be primarily related to MS we did discuss titrating duloxetine  today.  Risks and benefits were reviewed.  Past Psychiatric History: Diagnoses: MDD Medication trials: Zoloft  (lost effectiveness); Prozac (ineffective); Cymbalta  (appears to have been brief trial); Trazodone  (loss of effect over time), Belsomra  (ineffective), Klonopin  (beneficial for sleep but discontinued due to memory concerns and dysregulation of sleep architecture), Viibryd (couldn't get insurance to cover); Xanax ; Adderall (inattention, lost effect over time), venlafaxine  (some benefit but there was a plateau with residual depressive symptoms) Previous psychiatrist/therapist: Dr. Mercy for an initial eval in January of 2025 Hospitalizations: denies Suicide attempts: denies SIB: denies Hx of violence towards others: denies   Substance use:  -- Etoh: 1 glass wine every few months  -- Tobacco: quit 11/20/22; on Chantix   -- Cannabis: denies  -- Illicit drugs: denies  -- Caffeine: 1 soda daily; denies use of coffee, tea, energy drinks  Past Medical History:  Past Medical History:  Diagnosis Date   Ankle fracture    Depression    Diabetes (HCC)    Fatty liver    Osteopenia    Osteoporosis    Scarlet fever    Shingles    Sleep apnea    Spina bifida    Splenomegaly    Thrombocytopenia    Vitamin D  deficiency     Past Surgical History:  Procedure Laterality Date   ABDOMINAL HYSTERECTOMY     fibroids and heavy bleeding   BREAST BIOPSY Right    20+ yrs ago   CERVICAL FUSION     Family History:  Family History  Problem Relation Age of Onset   Bladder Cancer Mother    Colon cancer Mother    Colon cancer Father        diagnosed less than age of 64.    Bladder Cancer Maternal Grandmother    Leukemia Maternal Grandmother    Alzheimer's disease Neg Hx    Breast cancer Neg Hx      Social History:  Social History   Socioeconomic History   Marital status: Divorced    Spouse name: Not on file   Number of children: Not on file   Years of education: Not on file   Highest education level: Not on file  Occupational History   Not on file  Tobacco Use   Smoking status: Former    Current packs/day: 0.00    Types: Cigarettes    Quit date: 11/21/2022    Years since quitting: 1.5   Smokeless tobacco: Never  Substance and Sexual Activity   Alcohol use: Yes    Comment: occasional use. a few times a year.    Drug use: No   Sexual activity: Not on file  Other Topics Concern   Not on file  Social History Narrative   Pt lives with sister    Disability    Social Drivers of Health   Tobacco Use: Medium Risk (05/28/2024)   Patient History    Smoking Tobacco Use: Former    Smokeless Tobacco Use: Never  Passive Exposure: Not on file  Financial Resource Strain: Not on file  Food Insecurity: Not on file  Transportation Needs: Not on file  Physical Activity: Not on file  Stress: Not on file  Social Connections: Not on file  Depression (PHQ2-9): Low Risk (11/21/2023)   Depression (PHQ2-9)    PHQ-2 Score: 1  Alcohol Screen: Not on file  Housing: Not on file  Utilities: Not on file  Health Literacy: Not on file    Allergies:  Allergies  Allergen Reactions   Clindamycin Anaphylaxis   Indomethacin Hives   Levofloxacin Hives   Penicillin G Anaphylaxis    Other Reaction(s): Anaphylaxis-Streptomycin   Penicillins Anaphylaxis   Penicillins Cross Reactors Anaphylaxis   Sulfa Antibiotics Swelling and Other (See Comments)    Blister (steven john syndrome)  Other Reaction(s): hives /blisters, Unknown   Sulfamethoxazole-Trimethoprim Anaphylaxis   Atorvastatin     Other Reaction(s): Not available   Levaquin [Levofloxacin Hemihydrate] Hives   Lipitor  [Atorvastatin Calcium]    Multihance  [Gadobenate] Hives    Pt having hives/ rash on neck and chest    Zocor  [Simvastatin] Other (See Comments)    Other reaction(s): MUSCLE PAIN Muscle aches   Gadolinium Rash    Current Medications: Current Outpatient Medications  Medication Sig Dispense Refill   albuterol  (PROVENTIL ) (2.5 MG/3ML) 0.083% nebulizer solution Take 3 mLs (2.5 mg total) by nebulization every 6 (six) hours as needed for wheezing or shortness of breath. 75 mL 12   albuterol  (VENTOLIN  HFA) 108 (90 Base) MCG/ACT inhaler Inhale 2 puffs into the lungs every 6 (six) hours as needed for wheezing or shortness of breath. 8 g 6   aspirin EC 81 MG tablet Take 81 mg by mouth every other day.     budesonide -formoterol  (SYMBICORT ) 160-4.5 MCG/ACT inhaler Inhale 2 puffs into the lungs in the morning and at bedtime. 1 each 12   buPROPion  (WELLBUTRIN  XL) 300 MG 24 hr tablet Take 1 tablet (300 mg total) by mouth in the morning. 30 tablet 2   busPIRone  (BUSPAR ) 30 MG tablet Take 1 tablet (30 mg total) by mouth 2 (two) times daily. 60 tablet 2   Cladribine (MAVENCLAD, 9 TABS, PO) Take by mouth.     cyclobenzaprine  (FLEXERIL ) 10 MG tablet Take 1 tablet (10 mg total) by mouth at bedtime. 90 tablet 3   DULoxetine  (CYMBALTA ) 60 MG capsule Take 2 capsules (120 mg total) by mouth daily. 60 capsule 2   ezetimibe (ZETIA) 10 MG tablet Take 10 mg by mouth daily.      gabapentin  (NEURONTIN ) 300 MG capsule TAKE 1 CAPSULE BY MOUTH THREE TIMES DAILY 270 capsule 1   ipratropium (ATROVENT) 0.03 % nasal spray Place 2 sprays into both nostrils daily at 2 PM.     [START ON 06/06/2024] Lemborexant  (DAYVIGO ) 5 MG TABS Take 1 tablet (5 mg total) by mouth at bedtime. 30 tablet 1   metFORMIN (GLUCOPHAGE-XR) 500 MG 24 hr tablet Take 2 tablets by mouth 2 (two) times daily with a meal. 1 tablet in morning and 2 tablets at night     MOUNJARO 10 MG/0.5ML Pen 10 mg Subcutaneous once a week; Duration: 90 days     pantoprazole (PROTONIX) 40 MG tablet Take 40 mg by mouth every morning.     predniSONE  (DELTASONE ) 10 MG tablet Take 60mg  on  day 1. Reduce by 10mg  each subsequent day. (60, 50, 40, 30, 20, 10, stop) 21 tablet 0   Spacer/Aero-Holding Chambers (EQ SPACE CHAMBER ANTI-STATIC)  DEVI USE AS DIRECTED WITH  INHALERS 1 each 0   varenicline  (CHANTIX ) 1 MG tablet Take 1 tablet by mouth twice daily 60 tablet 0   Vibegron  (GEMTESA ) 75 MG TABS One po qd 30 tablet 11   No current facility-administered medications for this visit.     Musculoskeletal: Strength & Muscle Tone: within normal limits Gait & Station: normal Patient leans: N/A  Psychiatric Specialty Exam: Review of Systems  Blood pressure 126/86, pulse (!) 102, height 5' 4 (1.626 m), weight 186 lb (84.4 kg).Body mass index is 31.93 kg/m.  General Appearance: Fairly Groomed  Eye Contact:  Good  Speech:  Clear and Coherent  Volume:  Normal  Mood:  Depressed and Euthymic  Affect:  Appropriate  Thought Process:  Coherent  Orientation:  Full (Time, Place, and Person)  Thought Content: Logical   Suicidal Thoughts:  No  Homicidal Thoughts:  No  Memory:  Immediate;   Fair  Judgement:  Good  Insight:  Fair  Psychomotor Activity:  Normal  Concentration:  Concentration: Fair  Recall:  Fair  Fund of Knowledge: Good  Language: Good  Akathisia:  NA    AIMS (if indicated): not done  Assets:  Communication Skills Desire for Improvement Financial Resources/Insurance Housing Transportation  ADL's:  Intact  Cognition: WNL  Sleep:  Fair   Metabolic Disorder Labs: No results found for: HGBA1C, MPG No results found for: PROLACTIN No results found for: CHOL, TRIG, HDL, CHOLHDL, VLDL, LDLCALC Lab Results  Component Value Date   TSH 5.340 (H) 08/21/2023   TSH 3.080 10/26/2022    Therapeutic Level Labs: No results found for: LITHIUM No results found for: VALPROATE No results found for: CBMZ   Screenings: GAD-7    Flowsheet Row Counselor from 11/21/2023 in [facility] at [facility]  Total GAD-7 Score 5    Mini-Mental    Flowsheet Row Office Visit from 08/21/2023 in [facility]  Total Score (max 30 points ) 20   PHQ2-9    Flowsheet Row Counselor from 11/21/2023 in [facility] at [facility] Total Score 1   Flowsheet Row Counselor from 11/21/2023 in [facility] at [facility]  C-SSRS RISK CATEGORY Low Risk    Collaboration of Care: Collaboration of Care: Medication Management AEB medication prescription and Other provider involved in patient's care AEB neurology, pulmonology, and therapist chart review  Patient/Guardian was advised Release of Information must be obtained prior to any record release in order to collaborate their care with an outside provider. Patient/Guardian was advised if they have not already done so to contact the registration department to sign all necessary forms in order for us  to release information regarding their care.   Consent: Patient/Guardian gives verbal consent for treatment and assignment of benefits for services provided during this visit. Patient/Guardian expressed understanding and agreed to proceed.    Arvella CHRISTELLA Finder, MD 05/28/2024, 4:03 PM

## 2024-05-28 ENCOUNTER — Ambulatory Visit (HOSPITAL_COMMUNITY): Admitting: Psychiatry

## 2024-05-28 ENCOUNTER — Encounter (HOSPITAL_COMMUNITY): Payer: Self-pay | Admitting: Psychiatry

## 2024-05-28 ENCOUNTER — Other Ambulatory Visit: Payer: Self-pay

## 2024-05-28 DIAGNOSIS — F331 Major depressive disorder, recurrent, moderate: Secondary | ICD-10-CM | POA: Diagnosis not present

## 2024-05-28 DIAGNOSIS — G4721 Circadian rhythm sleep disorder, delayed sleep phase type: Secondary | ICD-10-CM

## 2024-05-28 MED ORDER — DULOXETINE HCL 60 MG PO CPEP: 120.0000 mg | ORAL_CAPSULE | Freq: Every day | ORAL | 2 refills | Status: AC

## 2024-05-28 MED ORDER — BUSPIRONE HCL 30 MG PO TABS: 30.0000 mg | ORAL_TABLET | Freq: Two times a day (BID) | ORAL | 2 refills | Status: AC

## 2024-05-28 MED ORDER — DAYVIGO 5 MG PO TABS: 1.0000 | ORAL_TABLET | Freq: Every day | ORAL | 1 refills | Status: AC

## 2024-05-28 MED ORDER — BUPROPION HCL ER (XL) 300 MG PO TB24: 300.0000 mg | ORAL_TABLET | Freq: Every morning | ORAL | 2 refills | Status: AC

## 2024-06-03 NOTE — Progress Notes (Signed)
 Left message on pt VM to call regarding 13 hr prep for MRI w wo on  06/11/24.

## 2024-06-04 ENCOUNTER — Telehealth: Payer: Self-pay

## 2024-06-04 MED ORDER — PREDNISONE 50 MG PO TABS: ORAL_TABLET | ORAL | 0 refills | Status: AC

## 2024-06-04 NOTE — Telephone Encounter (Signed)
 See telephone note  Pt to take 50 mg of prednisone  on 06/12/24 at 12:40 am, 50 mg of prednisone  on 06/12/24 at 6:40 am, and 50 mg of prednisone  on 06/12/24 at 12:40 pm. Pt is also to take 50 mg of benadryl on 06/12/24 at 12:40 pm. Please call [phone] with any questions.

## 2024-06-11 ENCOUNTER — Ambulatory Visit (HOSPITAL_COMMUNITY): Admitting: Licensed Clinical Social Worker

## 2024-06-11 DIAGNOSIS — F330 Major depressive disorder, recurrent, mild: Secondary | ICD-10-CM | POA: Diagnosis not present

## 2024-06-11 DIAGNOSIS — G35C1 Active secondary progressive multiple sclerosis: Secondary | ICD-10-CM | POA: Diagnosis not present

## 2024-06-11 DIAGNOSIS — R269 Unspecified abnormalities of gait and mobility: Secondary | ICD-10-CM | POA: Diagnosis not present

## 2024-06-11 NOTE — Progress Notes (Unsigned)
 Virtual Visit via Video Note  I connected with Helen Sandoval on 06/11/24 at 12:30 PM EST by a video enabled telemedicine application and verified that I am speaking with the correct person using two identifiers.  Location: Patient: home Provider: office   I discussed the limitations of evaluation and management by telemedicine and the availability of in person appointments. The patient expressed understanding and agreed to proceed.   I discussed the assessment and treatment plan with the patient. The patient was provided an opportunity to ask questions and all were answered. The patient agreed with the plan and demonstrated an understanding of the instructions.   The patient was advised to call back or seek an in-person evaluation if the symptoms worsen or if the condition fails to improve as anticipated.  I provided 45 minutes of non-face-to-face time during this encounter.   [name], LCSW   THERAPIST PROGRESS NOTE  Session Time: 12:30pm-1:15pm  Participation Level: {BHH PARTICIPATION LEVEL:22264}  Behavioral Response: {Appearance:22683}{BHH LEVEL OF CONSCIOUSNESS:22305}{BHH MOOD:22306}  Type of Therapy: {CHL AMB BH Type of Therapy:21022741}  Treatment Goals addressed: ***  ProgressTowards Goals: {Progress Towards Goals:21014066}  Interventions: {CHL AMB BH Type of Intervention:21022753}  Summary: Helen Sandoval is a 64 y.o. female who presents with ***.   Suicidal/Homicidal: {BHH YES OR NO:22294}{yes/no/with/without intent/plan:22693}  Therapist Response: ***  Plan: Return again in *** weeks.  Diagnosis: No diagnosis found.  Collaboration of Care: {BH OP Collaboration of Care:21014065}  Patient/Guardian was advised Release of Information must be obtained prior to any record release in order to collaborate their care with an outside provider. Patient/Guardian was advised if they have not already done so to contact the registration department to sign all  necessary forms in order for us  to release information regarding their care.   Consent: Patient/Guardian gives verbal consent for treatment and assignment of benefits for services provided during this visit. Patient/Guardian expressed understanding and agreed to proceed.   Harlene JONELLE Whatley, LCSW 06/11/2024

## 2024-06-12 ENCOUNTER — Ambulatory Visit
Admission: RE | Admit: 2024-06-12 | Discharge: 2024-06-12 | Disposition: A | Source: Ambulatory Visit | Attending: Neurology | Admitting: Neurology

## 2024-06-12 DIAGNOSIS — R269 Unspecified abnormalities of gait and mobility: Secondary | ICD-10-CM

## 2024-06-12 DIAGNOSIS — G35C1 Active secondary progressive multiple sclerosis: Secondary | ICD-10-CM

## 2024-06-12 MED ORDER — GADOPICLENOL 0.5 MMOL/ML IV SOLN
8.5000 mL | Freq: Once | INTRAVENOUS | Status: AC | PRN
  Administered 2024-06-12: 8.5 mL via INTRAVENOUS

## 2024-06-13 ENCOUNTER — Ambulatory Visit: Payer: Self-pay | Admitting: Neurology

## 2024-06-13 ENCOUNTER — Encounter (HOSPITAL_COMMUNITY): Payer: Self-pay | Admitting: Licensed Clinical Social Worker

## 2024-06-24 ENCOUNTER — Other Ambulatory Visit: Payer: Self-pay | Admitting: Neurology

## 2024-06-24 MED ORDER — TRAMADOL HCL 50 MG PO TABS: 50.0000 mg | ORAL_TABLET | Freq: Four times a day (QID) | ORAL | 1 refills | Status: AC | PRN

## 2024-06-25 ENCOUNTER — Ambulatory Visit (HOSPITAL_COMMUNITY): Admitting: Licensed Clinical Social Worker

## 2024-06-25 ENCOUNTER — Telehealth: Payer: Self-pay | Admitting: Neurology

## 2024-07-08 ENCOUNTER — Ambulatory Visit: Admitting: Pulmonary Disease

## 2024-07-09 ENCOUNTER — Ambulatory Visit (HOSPITAL_COMMUNITY): Admitting: Licensed Clinical Social Worker

## 2024-07-16 ENCOUNTER — Ambulatory Visit (HOSPITAL_COMMUNITY): Admitting: Licensed Clinical Social Worker

## 2024-08-01 ENCOUNTER — Ambulatory Visit (HOSPITAL_COMMUNITY): Admitting: Psychiatry

## 2024-08-13 ENCOUNTER — Ambulatory Visit (HOSPITAL_COMMUNITY): Admitting: Licensed Clinical Social Worker

## 2024-10-28 ENCOUNTER — Ambulatory Visit: Admitting: Psychology

## 2024-11-11 ENCOUNTER — Encounter: Admitting: Psychology

## 2024-12-18 ENCOUNTER — Ambulatory Visit: Admitting: Neurology
